# Patient Record
Sex: Female | Born: 1945 | Race: White | Hispanic: No | Marital: Married | Smoking: Former smoker
Health system: Southern US, Community
[De-identification: ages and names within clinical notes are randomized; demographics above are authoritative.]

## PROBLEM LIST (undated history)

## (undated) DIAGNOSIS — F410 Panic disorder [episodic paroxysmal anxiety] without agoraphobia: Secondary | ICD-10-CM

## (undated) DIAGNOSIS — M1990 Unspecified osteoarthritis, unspecified site: Secondary | ICD-10-CM

## (undated) DIAGNOSIS — E039 Hypothyroidism, unspecified: Secondary | ICD-10-CM

## (undated) DIAGNOSIS — E7800 Pure hypercholesterolemia, unspecified: Secondary | ICD-10-CM

## (undated) DIAGNOSIS — K5521 Angiodysplasia of colon with hemorrhage: Secondary | ICD-10-CM

## (undated) DIAGNOSIS — F32A Depression, unspecified: Secondary | ICD-10-CM

## (undated) DIAGNOSIS — F329 Major depressive disorder, single episode, unspecified: Secondary | ICD-10-CM

## (undated) DIAGNOSIS — R569 Unspecified convulsions: Secondary | ICD-10-CM

## (undated) DIAGNOSIS — E559 Vitamin D deficiency, unspecified: Secondary | ICD-10-CM

## (undated) DIAGNOSIS — M25512 Pain in left shoulder: Secondary | ICD-10-CM

## (undated) DIAGNOSIS — C4491 Basal cell carcinoma of skin, unspecified: Secondary | ICD-10-CM

## (undated) DIAGNOSIS — N63 Unspecified lump in unspecified breast: Secondary | ICD-10-CM

## (undated) DIAGNOSIS — R42 Dizziness and giddiness: Secondary | ICD-10-CM

## (undated) DIAGNOSIS — I959 Hypotension, unspecified: Secondary | ICD-10-CM

## (undated) DIAGNOSIS — C4492 Squamous cell carcinoma of skin, unspecified: Secondary | ICD-10-CM

## (undated) DIAGNOSIS — S2231XA Fracture of one rib, right side, initial encounter for closed fracture: Secondary | ICD-10-CM

## (undated) HISTORY — DX: Pure hypercholesterolemia, unspecified: E78.00

## (undated) HISTORY — DX: Basal cell carcinoma of skin, unspecified: C44.91

## (undated) HISTORY — DX: Angiodysplasia of colon with hemorrhage: K55.21

## (undated) HISTORY — DX: Depression, unspecified: F32.A

## (undated) HISTORY — DX: Panic disorder (episodic paroxysmal anxiety): F41.0

## (undated) HISTORY — DX: Hypothyroidism, unspecified: E03.9

## (undated) HISTORY — DX: Squamous cell carcinoma of skin, unspecified: C44.92

## (undated) HISTORY — PX: CATARACT EXTRACTION W/ INTRAOCULAR LENS  IMPLANT, BILATERAL: SHX1307

## (undated) HISTORY — DX: Fracture of one rib, right side, initial encounter for closed fracture: S22.31XA

## (undated) HISTORY — PX: OTHER SURGICAL HISTORY: SHX169

## (undated) HISTORY — PX: JOINT REPLACEMENT: SHX530

## (undated) HISTORY — PX: APPENDECTOMY: SHX54

## (undated) HISTORY — DX: Major depressive disorder, single episode, unspecified: F32.9

## (undated) HISTORY — DX: Vitamin D deficiency, unspecified: E55.9

## (undated) HISTORY — DX: Unspecified lump in unspecified breast: N63.0

## (undated) HISTORY — DX: Hypotension, unspecified: I95.9

---

## 1972-06-24 HISTORY — PX: TONSILLECTOMY: SUR1361

## 1979-06-25 HISTORY — PX: ABDOMINAL HYSTERECTOMY: SHX81

## 1999-05-02 ENCOUNTER — Encounter: Payer: Self-pay | Admitting: Family Medicine

## 1999-05-02 ENCOUNTER — Encounter: Admission: RE | Admit: 1999-05-02 | Discharge: 1999-05-02 | Payer: Self-pay | Admitting: Family Medicine

## 1999-10-01 ENCOUNTER — Inpatient Hospital Stay (HOSPITAL_COMMUNITY): Admission: AD | Admit: 1999-10-01 | Discharge: 1999-10-03 | Payer: Self-pay | Admitting: *Deleted

## 1999-10-03 ENCOUNTER — Inpatient Hospital Stay (HOSPITAL_COMMUNITY): Admission: EM | Admit: 1999-10-03 | Discharge: 1999-10-04 | Payer: Self-pay | Admitting: Emergency Medicine

## 1999-10-03 ENCOUNTER — Encounter: Payer: Self-pay | Admitting: Emergency Medicine

## 1999-10-08 ENCOUNTER — Other Ambulatory Visit (HOSPITAL_COMMUNITY): Admission: RE | Admit: 1999-10-08 | Discharge: 1999-10-15 | Payer: Self-pay

## 2000-01-15 ENCOUNTER — Encounter: Payer: Self-pay | Admitting: Family Medicine

## 2000-01-15 ENCOUNTER — Encounter: Admission: RE | Admit: 2000-01-15 | Discharge: 2000-01-15 | Payer: Self-pay | Admitting: Family Medicine

## 2000-06-20 ENCOUNTER — Encounter: Payer: Self-pay | Admitting: Family Medicine

## 2000-06-20 ENCOUNTER — Encounter: Admission: RE | Admit: 2000-06-20 | Discharge: 2000-06-20 | Payer: Self-pay | Admitting: Family Medicine

## 2000-07-01 ENCOUNTER — Encounter: Admission: RE | Admit: 2000-07-01 | Discharge: 2000-07-01 | Payer: Self-pay | Admitting: Family Medicine

## 2000-07-01 ENCOUNTER — Encounter: Payer: Self-pay | Admitting: Family Medicine

## 2000-07-09 ENCOUNTER — Encounter: Payer: Self-pay | Admitting: Family Medicine

## 2000-07-09 ENCOUNTER — Encounter: Admission: RE | Admit: 2000-07-09 | Discharge: 2000-07-09 | Payer: Self-pay | Admitting: Family Medicine

## 2001-03-04 ENCOUNTER — Encounter: Admission: RE | Admit: 2001-03-04 | Discharge: 2001-03-04 | Payer: Self-pay | Admitting: Family Medicine

## 2001-03-04 ENCOUNTER — Encounter: Payer: Self-pay | Admitting: Family Medicine

## 2001-06-24 HISTORY — PX: ORIF FIBULA FRACTURE: SHX5114

## 2001-12-30 ENCOUNTER — Emergency Department (HOSPITAL_COMMUNITY): Admission: EM | Admit: 2001-12-30 | Discharge: 2001-12-30 | Payer: Self-pay | Admitting: *Deleted

## 2002-01-05 ENCOUNTER — Encounter: Payer: Self-pay | Admitting: Emergency Medicine

## 2002-01-05 ENCOUNTER — Emergency Department (HOSPITAL_COMMUNITY): Admission: EM | Admit: 2002-01-05 | Discharge: 2002-01-06 | Payer: Self-pay | Admitting: Emergency Medicine

## 2002-01-06 ENCOUNTER — Emergency Department (HOSPITAL_COMMUNITY): Admission: EM | Admit: 2002-01-06 | Discharge: 2002-01-06 | Payer: Self-pay | Admitting: Emergency Medicine

## 2002-01-06 ENCOUNTER — Encounter: Payer: Self-pay | Admitting: Emergency Medicine

## 2002-01-11 ENCOUNTER — Encounter: Payer: Self-pay | Admitting: Internal Medicine

## 2002-01-11 ENCOUNTER — Inpatient Hospital Stay (HOSPITAL_COMMUNITY): Admission: EM | Admit: 2002-01-11 | Discharge: 2002-01-12 | Payer: Self-pay | Admitting: Emergency Medicine

## 2002-04-26 ENCOUNTER — Encounter: Admission: RE | Admit: 2002-04-26 | Discharge: 2002-04-26 | Payer: Self-pay | Admitting: Family Medicine

## 2002-04-26 ENCOUNTER — Encounter: Payer: Self-pay | Admitting: Family Medicine

## 2005-04-30 ENCOUNTER — Ambulatory Visit: Payer: Self-pay

## 2005-07-19 ENCOUNTER — Encounter: Admission: RE | Admit: 2005-07-19 | Discharge: 2005-07-19 | Payer: Self-pay | Admitting: Gastroenterology

## 2006-03-18 ENCOUNTER — Encounter: Admission: RE | Admit: 2006-03-18 | Discharge: 2006-03-18 | Payer: Self-pay | Admitting: Family Medicine

## 2006-03-26 ENCOUNTER — Encounter: Admission: RE | Admit: 2006-03-26 | Discharge: 2006-03-26 | Payer: Self-pay | Admitting: Family Medicine

## 2006-05-16 ENCOUNTER — Ambulatory Visit: Payer: Self-pay | Admitting: Family Medicine

## 2006-05-26 ENCOUNTER — Ambulatory Visit: Payer: Self-pay | Admitting: Family Medicine

## 2007-01-09 ENCOUNTER — Ambulatory Visit: Payer: Self-pay | Admitting: Family Medicine

## 2007-08-03 ENCOUNTER — Inpatient Hospital Stay: Payer: Self-pay | Admitting: Internal Medicine

## 2008-01-12 ENCOUNTER — Ambulatory Visit: Payer: Self-pay | Admitting: Internal Medicine

## 2008-09-06 ENCOUNTER — Emergency Department: Payer: Self-pay | Admitting: Internal Medicine

## 2008-09-09 ENCOUNTER — Observation Stay: Payer: Self-pay | Admitting: Specialist

## 2009-02-16 ENCOUNTER — Ambulatory Visit: Payer: Self-pay | Admitting: Gastroenterology

## 2009-02-22 ENCOUNTER — Ambulatory Visit: Payer: Self-pay | Admitting: Family Medicine

## 2010-03-29 ENCOUNTER — Ambulatory Visit: Payer: Self-pay | Admitting: Family Medicine

## 2010-07-14 ENCOUNTER — Encounter: Payer: Self-pay | Admitting: Family Medicine

## 2011-07-24 ENCOUNTER — Ambulatory Visit: Payer: Self-pay | Admitting: Internal Medicine

## 2011-10-21 DIAGNOSIS — N63 Unspecified lump in unspecified breast: Secondary | ICD-10-CM | POA: Insufficient documentation

## 2011-10-21 DIAGNOSIS — N6320 Unspecified lump in the left breast, unspecified quadrant: Secondary | ICD-10-CM | POA: Insufficient documentation

## 2011-10-21 HISTORY — DX: Unspecified lump in unspecified breast: N63.0

## 2011-10-22 ENCOUNTER — Ambulatory Visit: Payer: Self-pay | Admitting: Internal Medicine

## 2012-05-19 ENCOUNTER — Emergency Department: Payer: Self-pay | Admitting: Emergency Medicine

## 2012-06-10 ENCOUNTER — Ambulatory Visit: Payer: Self-pay | Admitting: Internal Medicine

## 2012-06-10 DIAGNOSIS — F17200 Nicotine dependence, unspecified, uncomplicated: Secondary | ICD-10-CM | POA: Insufficient documentation

## 2012-09-17 ENCOUNTER — Ambulatory Visit: Payer: Self-pay | Admitting: Internal Medicine

## 2012-09-30 ENCOUNTER — Ambulatory Visit: Payer: Self-pay | Admitting: Internal Medicine

## 2012-09-30 DIAGNOSIS — Z9889 Other specified postprocedural states: Secondary | ICD-10-CM | POA: Insufficient documentation

## 2012-12-03 ENCOUNTER — Ambulatory Visit: Payer: Self-pay | Admitting: Internal Medicine

## 2013-03-02 ENCOUNTER — Encounter: Payer: Self-pay | Admitting: Internal Medicine

## 2013-03-02 ENCOUNTER — Ambulatory Visit (INDEPENDENT_AMBULATORY_CARE_PROVIDER_SITE_OTHER): Payer: Medicare Other | Admitting: Internal Medicine

## 2013-03-02 VITALS — BP 100/70 | HR 83 | Temp 98.2°F | Ht 62.5 in | Wt 145.0 lb

## 2013-03-02 DIAGNOSIS — E039 Hypothyroidism, unspecified: Secondary | ICD-10-CM

## 2013-03-02 DIAGNOSIS — E559 Vitamin D deficiency, unspecified: Secondary | ICD-10-CM

## 2013-03-02 DIAGNOSIS — F329 Major depressive disorder, single episode, unspecified: Secondary | ICD-10-CM

## 2013-03-02 DIAGNOSIS — C4490 Unspecified malignant neoplasm of skin, unspecified: Secondary | ICD-10-CM

## 2013-03-02 DIAGNOSIS — F410 Panic disorder [episodic paroxysmal anxiety] without agoraphobia: Secondary | ICD-10-CM

## 2013-03-02 DIAGNOSIS — E7800 Pure hypercholesterolemia, unspecified: Secondary | ICD-10-CM

## 2013-03-02 MED ORDER — FLUOXETINE HCL 20 MG PO TABS: 20.0000 mg | ORAL_TABLET | Freq: Every day | ORAL | Status: DC

## 2013-03-02 MED ORDER — VENLAFAXINE HCL ER 37.5 MG PO CP24: ORAL_CAPSULE | ORAL | Status: DC

## 2013-03-02 NOTE — Patient Instructions (Addendum)
Take two effexor - per day.  Start prozac 20mg  one per day.  Call with update over next week and let me know how you are doing.    [name] [phone]

## 2013-03-07 ENCOUNTER — Encounter: Payer: Self-pay | Admitting: Internal Medicine

## 2013-03-07 DIAGNOSIS — F419 Anxiety disorder, unspecified: Secondary | ICD-10-CM | POA: Insufficient documentation

## 2013-03-07 DIAGNOSIS — F329 Major depressive disorder, single episode, unspecified: Secondary | ICD-10-CM | POA: Insufficient documentation

## 2013-03-07 DIAGNOSIS — F32A Depression, unspecified: Secondary | ICD-10-CM | POA: Insufficient documentation

## 2013-03-07 DIAGNOSIS — E559 Vitamin D deficiency, unspecified: Secondary | ICD-10-CM | POA: Insufficient documentation

## 2013-03-07 DIAGNOSIS — E039 Hypothyroidism, unspecified: Secondary | ICD-10-CM | POA: Insufficient documentation

## 2013-03-07 DIAGNOSIS — E7800 Pure hypercholesterolemia, unspecified: Secondary | ICD-10-CM | POA: Insufficient documentation

## 2013-03-07 DIAGNOSIS — F410 Panic disorder [episodic paroxysmal anxiety] without agoraphobia: Secondary | ICD-10-CM | POA: Insufficient documentation

## 2013-03-07 DIAGNOSIS — C4490 Unspecified malignant neoplasm of skin, unspecified: Secondary | ICD-10-CM | POA: Insufficient documentation

## 2013-03-07 NOTE — Assessment & Plan Note (Signed)
Better.  No agoraphobia.  Now with some increased stress with her friends medical issues.  Feel she needs something other than effexor.  Will taper off effexor and start prozac.  Tapering directions given.  Follow.

## 2013-03-07 NOTE — Assessment & Plan Note (Signed)
On levothyroxine.  Check tsh with next labs.

## 2013-03-07 NOTE — Progress Notes (Signed)
Subjective:    Patient ID: Pam Salazar, female    DOB: 05-Jan-1946, 67 y.o.   MRN: [id number]  HPI 67 year old female with past history of depression, hypercholesterolemia and hypothyroidism who comes in today to follow up on these issues as well as to establish care.  She reports having a history of depression and panic disorder with associated agoraphobia.  The panic disorder and agoraphobia are better.  [facility] and [facility].  On effexor for her depression.  Some increased stress recently.  Her best friend was just diagnosed with breast cancer.  She plans to be with her through her treatments.  Feels she needs something other than the effexor.  Some increased anxiety.  She did start smoking again at Christmas.  Plans to stop.  Nicotine gum works well for her.  Tries to stay active.  No cardiac symptoms with increased activity or exertion.  Breathing stable.  No nausea or vomiting.  No bowel change.  Colonoscopy three years ago.  Recommended a follow up colonoscopy in 10 years from last.  Seeing Dr Cheree Ditto for a "pre melanoma" skin lesion.  Is s/p removal.     Past Medical History  Diagnosis Date  . Hypercholesterolemia   . Hypothyroidism   . Depression   . Panic disorder     previous agoraphobia  . Vitamin D deficiency     Outpatient Encounter Prescriptions as of 03/02/2013  Medication Sig Dispense Refill  . beta carotene w/minerals (OCUVITE) tablet Take 1 tablet by mouth daily.      . clonazePAM (KLONOPIN) 1 MG tablet Take 1 mg by mouth 2 (two) times daily as needed for anxiety.      Marland Kitchen levothyroxine (SYNTHROID, LEVOTHROID) 75 MCG tablet Take 75 mcg by mouth daily before breakfast.      . simvastatin (ZOCOR) 20 MG tablet Take 20 mg by mouth every evening.      . [DISCONTINUED] venlafaxine XR (EFFEXOR-XR) 150 MG 24 hr capsule Take 150 mg by mouth daily.      Marland Kitchen FLUoxetine (PROZAC) 20 MG tablet Take 1 tablet (20 mg total) by mouth daily.  30 tablet  1  . venlafaxine XR  (EFFEXOR XR) 37.5 MG 24 hr capsule Take two per day  60 capsule  0   No facility-administered encounter medications on file as of 03/02/2013.    Review of Systems Patient denies any headache, lightheadedness or dizziness.  No sinus or allergy symptoms.  No chest pain, tightness or palpitations.  No increased shortness of breath, cough or congestion.  No nausea or vomiting.  No acid reflux.  No abdominal pain or cramping.  No bowel change, such as diarrhea, constipation, BRBPR or melana.  No urine change.   Is s/p ORIF right lower extremity.  Overall has done well.  Dealing with the increased stress of her best friends medical issues.  She feels she is dong relatively well.  Previously on zoloft and cymbalta.  Now on effexor.  Feels this is not helping as well as she would like for it to.  With some increased stress.   Some anxiety related to this.  The previous panic attacks and agoraphobia - better.       Objective:   Physical Exam Filed Vitals:   03/02/13 1333  BP: 100/70  Pulse: 83  Temp: 98.2 F (36.8 C)   Pulse 31  67 year old female in no acute distress.   HEENT:  Nares- clear.  Oropharynx - without lesions. NECK:  Supple.  Nontender.  No audible bruit.  HEART:  Appears to be regular. LUNGS:  No crackles or wheezing audible.  Respirations even and unlabored.  RADIAL PULSE:  Equal bilaterally.    ABDOMEN:  Soft, nontender.  Bowel sounds present and normal.  No audible abdominal bruit.    EXTREMITIES:  No increased edema present.  DP pulses palpable and equal bilaterally.          Assessment & Plan:  HEALTH MAINTENANCE.  Schedule her for a physical when due.  Obtain records for review.  Mammogram when due.  Colonoscopy three years ago.  Due follow up colonoscopy 10 years from last.    I spent 45 minutes with the patient and more than 50% of the time was spent in consultation regarding the above.

## 2013-03-07 NOTE — Assessment & Plan Note (Signed)
Increased stress with her friends medical issues.  On effexor.  Not having issues now with panic disorder.  Some anxiety.  Will start prozac 10mg  q day.  Taper her off effexor.  Directions given.  Follow closely.  Get her back in soon to reassess.  If any problems, call or be reevaluated.

## 2013-03-07 NOTE — Assessment & Plan Note (Signed)
Followed by Dr Cheree Ditto.  States had a "pre melanoma" lesion.  Removed.  Continues follow up with Dr Cheree Ditto.

## 2013-03-07 NOTE — Assessment & Plan Note (Signed)
On simvastatin.  Low cholesterol diet and exercise.  Check lipid panel and liver function.  

## 2013-03-07 NOTE — Assessment & Plan Note (Signed)
Recheck vitamin D level 

## 2013-03-11 ENCOUNTER — Telehealth: Payer: Self-pay | Admitting: Internal Medicine

## 2013-03-11 ENCOUNTER — Encounter: Payer: Self-pay | Admitting: Internal Medicine

## 2013-03-11 DIAGNOSIS — Z860100 Personal history of colon polyps, unspecified: Secondary | ICD-10-CM | POA: Insufficient documentation

## 2013-03-11 NOTE — Telephone Encounter (Signed)
Patient Information:  Caller Name: Duaa  Phone: [phone]  Patient: Pam Salazar, Pam Salazar  Gender: Female  DOB: Sep 05, 1945  Age: 67 Years  PCP: Dale Fowler  Office Follow Up:  Does the office need to follow up with this patient?: No  Instructions For The Office: N/A   Symptoms  Reason For Call & Symptoms: Pt states has sting  on her left lower leg.  Reviewed Health History In EMR: Yes  Reviewed Medications In EMR: Yes  Reviewed Allergies In EMR: Yes  Reviewed Surgeries / Procedures: Yes  Date of Onset of Symptoms: 03/11/2013  Guideline(s) Used:  Bee Sting  Disposition Per Guideline:   Home Care  Reason For Disposition Reached:   Normal local reaction to bee, wasp, or yellow jacket sting  Advice Given:  Apply Cold to the Area for Pain - Cold Pack Method:  Wrap a bag of ice in a towel (or use a bag of frozen vegetables such as peas).  Apply this cold pack to the area of the sting for 10-20 minutes.  You may repeat this as needed, to relieve symptoms of pain and swelling.  Pain Medicines:  For pain relief, you can take either acetaminophen, ibuprofen, or naproxen.  Acetaminophen (e.g., Tylenol):  Regular Strength Tylenol: Take 650 mg (two 325 mg pills) by mouth every 4-6 hours as needed. Each Regular Strength Tylenol pill has 325 mg of acetaminophen.  Hydrocortisone Cream for Itching:  Hydrocortisone cream applied to the sting area 4 times a day can also help reduce itching. Use it for a couple days until the itch is mild.  Available over-the-counter in [place] as 0.5% and 1% cream.  Available over-the-counter in [place] as 0.5% cream.  Read the package instructions thoroughly on all medications that you use.  Expected Course:  Stings only rarely get infected.  Call Back If:  Difficulty breathing or swallowing (generally develops within the first 2 hours after the sting; call 911)  Swelling becomes huge  Sting begins to look infected  You become worse.  Patient Will  Follow Care Advice:  YES

## 2013-03-11 NOTE — Telephone Encounter (Signed)
noted 

## 2013-03-13 ENCOUNTER — Encounter: Payer: Self-pay | Admitting: Internal Medicine

## 2013-03-22 ENCOUNTER — Encounter: Payer: Self-pay | Admitting: Internal Medicine

## 2013-03-23 ENCOUNTER — Encounter: Payer: Self-pay | Admitting: Internal Medicine

## 2013-03-24 ENCOUNTER — Encounter: Payer: Self-pay | Admitting: Internal Medicine

## 2013-03-31 ENCOUNTER — Encounter: Payer: Self-pay | Admitting: Internal Medicine

## 2013-04-02 ENCOUNTER — Encounter: Payer: Self-pay | Admitting: Internal Medicine

## 2013-04-02 ENCOUNTER — Other Ambulatory Visit: Payer: Self-pay | Admitting: *Deleted

## 2013-04-02 ENCOUNTER — Other Ambulatory Visit: Payer: Self-pay | Admitting: Internal Medicine

## 2013-04-02 MED ORDER — VENLAFAXINE HCL ER 150 MG PO CP24: 150.0000 mg | ORAL_CAPSULE | Freq: Every day | ORAL | Status: DC

## 2013-04-02 MED ORDER — SIMVASTATIN 20 MG PO TABS: 20.0000 mg | ORAL_TABLET | Freq: Every evening | ORAL | Status: DC

## 2013-04-02 MED ORDER — LEVOTHYROXINE SODIUM 75 MCG PO TABS: 75.0000 ug | ORAL_TABLET | Freq: Every day | ORAL | Status: DC

## 2013-04-02 MED ORDER — CLONAZEPAM 1 MG PO TABS: 1.0000 mg | ORAL_TABLET | Freq: Two times a day (BID) | ORAL | Status: DC | PRN

## 2013-04-02 NOTE — Telephone Encounter (Signed)
Refilled Effexor-XR 150 mg per Dr. Roby Lofts last myChart message.

## 2013-04-13 ENCOUNTER — Encounter: Payer: Self-pay | Admitting: *Deleted

## 2013-04-14 ENCOUNTER — Encounter: Payer: Self-pay | Admitting: Internal Medicine

## 2013-04-14 ENCOUNTER — Ambulatory Visit (INDEPENDENT_AMBULATORY_CARE_PROVIDER_SITE_OTHER): Payer: Medicare Other | Admitting: Internal Medicine

## 2013-04-14 VITALS — BP 110/70 | HR 81 | Temp 97.7°F | Ht 62.5 in | Wt 145.2 lb

## 2013-04-14 DIAGNOSIS — E559 Vitamin D deficiency, unspecified: Secondary | ICD-10-CM

## 2013-04-14 DIAGNOSIS — F410 Panic disorder [episodic paroxysmal anxiety] without agoraphobia: Secondary | ICD-10-CM

## 2013-04-14 DIAGNOSIS — C4490 Unspecified malignant neoplasm of skin, unspecified: Secondary | ICD-10-CM

## 2013-04-14 DIAGNOSIS — E039 Hypothyroidism, unspecified: Secondary | ICD-10-CM

## 2013-04-14 DIAGNOSIS — Z86010 Personal history of colonic polyps: Secondary | ICD-10-CM

## 2013-04-14 DIAGNOSIS — F329 Major depressive disorder, single episode, unspecified: Secondary | ICD-10-CM

## 2013-04-14 DIAGNOSIS — E7800 Pure hypercholesterolemia, unspecified: Secondary | ICD-10-CM

## 2013-04-14 MED ORDER — CLONAZEPAM 1 MG PO TABS: 1.0000 mg | ORAL_TABLET | Freq: Two times a day (BID) | ORAL | Status: DC | PRN

## 2013-04-14 NOTE — Progress Notes (Signed)
Subjective:    Patient ID: Pam Salazar, female    DOB: 03-Sep-1945, 67 y.o.   MRN: [id number]  HPI 67 year old female with past history of depression, hypercholesterolemia and hypothyroidism who comes in today for a scheduled follow up.  She reports having a history of depression and panic disorder with associated agoraphobia.  The panic disorder and agoraphobia are better.  [facility] and [facility], etc.  On effexor for her depression.  Some increased anxiety recently.  See last note for details.  We tried to change her to prozac.  She did not tolerate the change.  Feels better now.  Stress is better.  Her friend is doing better.   She did start smoking again at Christmas.  Plans to stop.  Nicotine gum works well for her.  Tries to stay active.  No cardiac symptoms with increased activity or exertion.  Breathing stable.  No nausea or vomiting.  No bowel change.  Colonoscopy three years ago.  Recommended a follow up colonoscopy in 10 years from last.  Seeing Dr Cheree Ditto for a "pre melanoma" skin lesion.  Is s/p removal.     Past Medical History  Diagnosis Date  . Hypercholesterolemia   . Hypothyroidism   . Depression   . Panic disorder     previous agoraphobia  . Vitamin D deficiency     Outpatient Encounter Prescriptions as of 04/14/2013  Medication Sig Dispense Refill  . beta carotene w/minerals (OCUVITE) tablet Take 1 tablet by mouth daily.      . clonazePAM (KLONOPIN) 1 MG tablet Take 1 tablet (1 mg total) by mouth 2 (two) times daily as needed for anxiety.  30 tablet  2  . levothyroxine (SYNTHROID, LEVOTHROID) 75 MCG tablet Take 1 tablet (75 mcg total) by mouth daily before breakfast.  30 tablet  5  . simvastatin (ZOCOR) 20 MG tablet Take 1 tablet (20 mg total) by mouth every evening.  30 tablet  5  . venlafaxine XR (EFFEXOR-XR) 150 MG 24 hr capsule Take 1 capsule (150 mg total) by mouth daily.  30 capsule  5  . [DISCONTINUED] FLUoxetine (PROZAC) 20 MG tablet Take 1 tablet (20  mg total) by mouth daily.  30 tablet  1   No facility-administered encounter medications on file as of 04/14/2013.    Review of Systems Patient denies any headache, lightheadedness or dizziness.  No sinus or allergy symptoms.  No chest pain, tightness or palpitations.  No increased shortness of breath, cough or congestion.  No nausea or vomiting.  No acid reflux.  No abdominal pain or cramping.  No bowel change, such as diarrhea, constipation, BRBPR or melana.  No urine change.   Is s/p ORIF right lower extremity.  Overall has done well.  Dealing with the increased stress of her best friends medical issues.  She feels she is dong relatively well.  Previously on zoloft and cymbalta.  Now on effexor. This is working well for her now.  Stress better.  Overall feels good.       Objective:   Physical Exam  Filed Vitals:   04/14/13 1106  BP: 110/70  Pulse: 81  Temp: 97.7 F (48.66 C)   67 year old female in no acute distress.   HEENT:  Nares- clear.  Oropharynx - without lesions. NECK:  Supple.  Nontender.  No audible bruit.  HEART:  Appears to be regular. LUNGS:  No crackles or wheezing audible.  Respirations even and unlabored.  RADIAL PULSE:  Equal bilaterally.    ABDOMEN:  Soft, nontender.  Bowel sounds present and normal.  No audible abdominal bruit.    EXTREMITIES:  No increased edema present.  DP pulses palpable and equal bilaterally.          Assessment & Plan:  HEALTH MAINTENANCE.  Schedule her for a physical when due.  Obtain records for review.  Mammogram when due.  Colonoscopy three years ago.  Due follow up colonoscopy 10 years from last.

## 2013-04-14 NOTE — Assessment & Plan Note (Addendum)
Better.  No agoraphobia.  Anxiety better.  On effexor.  Follow.    

## 2013-04-18 ENCOUNTER — Encounter: Payer: Self-pay | Admitting: Internal Medicine

## 2013-04-18 NOTE — Assessment & Plan Note (Signed)
On simvastatin.  Low cholesterol diet and exercise.  Follow lipid panel and liver function.      

## 2013-04-18 NOTE — Assessment & Plan Note (Signed)
Follow vitamin D level.  

## 2013-04-18 NOTE — Assessment & Plan Note (Signed)
On levothyroxine.  Check tsh with next labs.

## 2013-04-18 NOTE — Assessment & Plan Note (Signed)
Colonoscopy as outlined.  Follow.  

## 2013-04-18 NOTE — Assessment & Plan Note (Signed)
Followed by Dr Cheree Ditto.  States had a "pre melanoma" lesion.  Removed.  Continues follow up with Dr Cheree Ditto.

## 2013-04-18 NOTE — Assessment & Plan Note (Signed)
Doing well on effexor.  Follow.  Feels better.   

## 2013-04-29 ENCOUNTER — Other Ambulatory Visit: Payer: Self-pay

## 2013-05-06 ENCOUNTER — Encounter: Payer: Self-pay | Admitting: Internal Medicine

## 2013-05-06 ENCOUNTER — Ambulatory Visit: Payer: Medicare Other | Admitting: Adult Health

## 2013-05-07 ENCOUNTER — Encounter: Payer: Self-pay | Admitting: Adult Health

## 2013-05-07 ENCOUNTER — Ambulatory Visit (INDEPENDENT_AMBULATORY_CARE_PROVIDER_SITE_OTHER): Payer: Medicare Other | Admitting: Adult Health

## 2013-05-07 VITALS — BP 134/78 | HR 110 | Temp 97.9°F | Resp 14 | Wt 146.0 lb

## 2013-05-07 DIAGNOSIS — J329 Chronic sinusitis, unspecified: Secondary | ICD-10-CM

## 2013-05-07 MED ORDER — AMOXICILLIN-POT CLAVULANATE 875-125 MG PO TABS: 1.0000 | ORAL_TABLET | Freq: Two times a day (BID) | ORAL | Status: DC

## 2013-05-07 MED ORDER — FLUTICASONE PROPIONATE 50 MCG/ACT NA SUSP: 2.0000 | Freq: Every day | NASAL | Status: DC

## 2013-05-07 MED ORDER — GUAIFENESIN-CODEINE 100-10 MG/5ML PO SOLN: 5.0000 mL | Freq: Three times a day (TID) | ORAL | Status: DC | PRN

## 2013-05-07 NOTE — Assessment & Plan Note (Signed)
Start Augmentin twice a day x10 days. Flonase 2 sprays into each nostril daily. 

## 2013-05-07 NOTE — Progress Notes (Signed)
Pre visit review using our clinic review tool, if applicable. No additional management support is needed unless otherwise documented below in the visit note. 

## 2013-05-07 NOTE — Progress Notes (Signed)
  Subjective:    Patient ID: Pam Salazar, female    DOB: Mar 01, 1946, 67 y.o.   MRN: [id number]  HPI  Patient is a 67 year old female who presents to clinic with sore throat x2 days, cough, green drainage from sinuses, sinus pressure.    Review of Systems  Constitutional: Negative for fever and chills.  HENT: Positive for congestion, postnasal drip, rhinorrhea, sinus pressure and sore throat.        Green drainage  Respiratory: Positive for cough and wheezing.   Neurological: Positive for headaches.       Objective:   Physical Exam  Constitutional: She is oriented to person, place, and time. She appears well-developed and well-nourished.  HENT:  Right Ear: External ear normal.  Left Ear: External ear normal.  Pharyngeal erythema  Cardiovascular: Normal rate and regular rhythm.  Exam reveals no gallop.   No murmur heard. Pulmonary/Chest: Effort normal and breath sounds normal. No respiratory distress. She has no wheezes.  Neurological: She is alert and oriented to person, place, and time.  Psychiatric: She has a normal mood and affect. Her behavior is normal. Judgment and thought content normal.    BP 134/78  Pulse 110  Temp(Src) 97.9 F (36.6 C) (Oral)  Resp 14  Wt 146 lb (66.225 kg)  SpO2 97%      Assessment & Plan:

## 2013-05-07 NOTE — Progress Notes (Signed)
Rx phoned to pharmacy.  

## 2013-05-13 ENCOUNTER — Ambulatory Visit: Payer: Self-pay | Admitting: Family Medicine

## 2013-05-25 ENCOUNTER — Encounter: Payer: Self-pay | Admitting: *Deleted

## 2013-05-27 NOTE — Telephone Encounter (Signed)
Mailed unread message to pt  

## 2013-07-21 ENCOUNTER — Encounter: Payer: Self-pay | Admitting: Internal Medicine

## 2013-07-26 ENCOUNTER — Telehealth: Payer: Self-pay | Admitting: Internal Medicine

## 2013-07-26 ENCOUNTER — Other Ambulatory Visit: Payer: Self-pay | Admitting: Internal Medicine

## 2013-07-26 ENCOUNTER — Telehealth: Payer: Self-pay | Admitting: Emergency Medicine

## 2013-07-26 NOTE — Telephone Encounter (Signed)
Can you help with this?  She has the appt.  Just let me know what I need to do.

## 2013-07-26 NOTE — Telephone Encounter (Signed)
Referral to [facility] under way for Dr. Herbert Deaner

## 2013-07-26 NOTE — Telephone Encounter (Signed)
Please notify pt that we have faxed for approval.

## 2013-07-26 NOTE — Telephone Encounter (Signed)
Ok refill? 

## 2013-07-26 NOTE — Telephone Encounter (Signed)
Referral faxed to [facility] for approval.

## 2013-07-26 NOTE — Telephone Encounter (Signed)
Patient has a new mole on her left upper arm and it is tender to the touch. She would like a referral to the dermatologist she now has [employer]. Her dermeatologist is Dr Cleophas [facility] and she has an appointment with them today. She has had skin cancer in the past.

## 2013-07-26 NOTE — Telephone Encounter (Signed)
Refilled #[id number] with one refill - clonazepam.

## 2013-07-27 NOTE — Telephone Encounter (Signed)
Pt calling in to be sure we will refill this medication as she only have one left.

## 2013-07-27 NOTE — Telephone Encounter (Signed)
LVM for patient to call our office  

## 2013-07-29 NOTE — Telephone Encounter (Signed)
Pt has been approved to see Dr. Herbert Deaner with 4 visits, exp 01/23/14. Auth # [id number]

## 2013-07-30 NOTE — Telephone Encounter (Signed)
Refilled on 07/26/13

## 2013-08-03 ENCOUNTER — Encounter: Payer: Self-pay | Admitting: Internal Medicine

## 2013-08-03 ENCOUNTER — Ambulatory Visit (INDEPENDENT_AMBULATORY_CARE_PROVIDER_SITE_OTHER): Payer: Medicare HMO | Admitting: Internal Medicine

## 2013-08-03 VITALS — BP 110/70 | HR 79 | Temp 98.1°F | Ht 62.5 in | Wt 140.8 lb

## 2013-08-03 DIAGNOSIS — F32A Depression, unspecified: Secondary | ICD-10-CM

## 2013-08-03 DIAGNOSIS — F3289 Other specified depressive episodes: Secondary | ICD-10-CM

## 2013-08-03 DIAGNOSIS — Z01818 Encounter for other preprocedural examination: Secondary | ICD-10-CM

## 2013-08-03 DIAGNOSIS — F329 Major depressive disorder, single episode, unspecified: Secondary | ICD-10-CM

## 2013-08-03 DIAGNOSIS — Z860100 Personal history of colon polyps, unspecified: Secondary | ICD-10-CM

## 2013-08-03 DIAGNOSIS — K625 Hemorrhage of anus and rectum: Secondary | ICD-10-CM

## 2013-08-03 DIAGNOSIS — E559 Vitamin D deficiency, unspecified: Secondary | ICD-10-CM

## 2013-08-03 DIAGNOSIS — Z23 Encounter for immunization: Secondary | ICD-10-CM

## 2013-08-03 DIAGNOSIS — E7800 Pure hypercholesterolemia, unspecified: Secondary | ICD-10-CM

## 2013-08-03 DIAGNOSIS — F410 Panic disorder [episodic paroxysmal anxiety] without agoraphobia: Secondary | ICD-10-CM

## 2013-08-03 DIAGNOSIS — E039 Hypothyroidism, unspecified: Secondary | ICD-10-CM

## 2013-08-03 NOTE — Progress Notes (Signed)
Subjective:    Patient ID: Pam Salazar, female    DOB: 1945-12-10, 68 y.o.   MRN: [id number]  HPI 68 year old female with past history of depression, hypercholesterolemia and hypothyroidism who comes in today to follow up on these issues as well as for a complete physical exam.   She reports having a history of depression and panic disorder with associated agoraphobia.  The panic disorder and agoraphobia are better.  [facility] and [facility], etc.  On effexor for her depression.  Some increased anxiety recently.  See last note for details.  We tried to change her to prozac.  She did not tolerate the change.  Feels better now.  Stress is better.  Her friend is doing better.   She did start smoking again at Christmas.  Used nicotine gum.  Has stopped.   Tries to stay active.  No cardiac symptoms with increased activity or exertion.  Breathing stable.  No nausea or vomiting.  No bowel change.  Seeing Dr Phillip Heal for a "pre melanoma" skin lesion.  Is s/p removal.  She does report having some problems with constipation.  Eating prunes.  Taking stool softener and fiber one.  Has noticed some pink/reddish stains on her pants.  Saw Dr Dionne Milo and had colonoscopy five years ago.  Has a maternal uncle and maternal nephew with colon cancer.     Past Medical History  Diagnosis Date  . Hypercholesterolemia   . Hypothyroidism   . Depression   . Panic disorder     previous agoraphobia  . Vitamin D deficiency     Outpatient Encounter Prescriptions as of 08/03/2013  Medication Sig  . beta carotene w/minerals (OCUVITE) tablet Take 1 tablet by mouth daily.  . clonazePAM (KLONOPIN) 1 MG tablet TAKE ONE TABLET BY MOUTH TWICE A DAY AS NEEDED FOR ANXIETY   . levothyroxine (SYNTHROID, LEVOTHROID) 75 MCG tablet Take 1 tablet (75 mcg total) by mouth daily before breakfast.  . simvastatin (ZOCOR) 20 MG tablet Take 1 tablet (20 mg total) by mouth every evening.  . venlafaxine XR (EFFEXOR-XR) 150 MG 24 hr  capsule Take 1 capsule (150 mg total) by mouth daily.  . [DISCONTINUED] fluticasone (FLONASE) 50 MCG/ACT nasal spray Place 2 sprays into both nostrils daily.  . [DISCONTINUED] amoxicillin-clavulanate (AUGMENTIN) 875-125 MG per tablet Take 1 tablet by mouth 2 (two) times daily.  . [DISCONTINUED] guaiFENesin-codeine 100-10 MG/5ML syrup Take 5 mLs by mouth 3 (three) times daily as needed for cough.    Review of Systems Patient denies any headache, lightheadedness or dizziness.  No sinus or allergy symptoms.  No chest pain, tightness or palpitations.  No increased shortness of breath, cough or congestion.  No nausea or vomiting.  No acid reflux.  No abdominal pain or cramping.  No bowel change, such as diarrhea or melana.  Does report some constipation.  Some BRB on her underpants (occasionally).  No urine change.   Is s/p ORIF right lower extremity.  Overall has done well.  Dealing with the increased stress of her best friends medical issues.  She feels she is doing better.  Previously on zoloft and cymbalta.  Now on effexor. This is working well for her now.  Stress better.  Overall feels good.       Objective:   Physical Exam  Filed Vitals:   08/03/13 1548  BP: 110/70  Pulse: 79  Temp: 98.1 F (67.36 C)   68 year old female in no  acute distress.   HEENT:  Nares- clear.  Oropharynx - without lesions. NECK:  Supple.  Nontender.  No audible bruit.  HEART:  Appears to be regular. LUNGS:  No crackles or wheezing audible.  Respirations even and unlabored.  RADIAL PULSE:  Equal bilaterally.    ABDOMEN:  Soft, nontender.  Bowel sounds present and normal.  No audible abdominal bruit.   RECTAL:  She declined.    EXTREMITIES:  No increased edema present.  DP pulses palpable and equal bilaterally.          Assessment & Plan:  HEALTH MAINTENANCE.  Schedule her for a physical next visit.  Mammogram when due.  Colonoscopy (she reports) five years ago.

## 2013-08-03 NOTE — Progress Notes (Signed)
Pre-visit discussion using our clinic review tool. No additional management support is needed unless otherwise documented below in the visit note.  

## 2013-08-08 ENCOUNTER — Encounter: Payer: Self-pay | Admitting: Internal Medicine

## 2013-08-08 DIAGNOSIS — Z01818 Encounter for other preprocedural examination: Secondary | ICD-10-CM | POA: Insufficient documentation

## 2013-08-08 NOTE — Assessment & Plan Note (Signed)
Colonoscopy as outlined.  Now with rectal bleeding and bowel changes.  Refer back to GI for further evaluation and colonoscopy f/u.

## 2013-08-08 NOTE — Assessment & Plan Note (Signed)
Better.  No agoraphobia.  Anxiety better.  On effexor.  Follow.    

## 2013-08-08 NOTE — Assessment & Plan Note (Signed)
On levothyroxine.  Follow tsh.  

## 2013-08-08 NOTE — Assessment & Plan Note (Signed)
Follow vitamin D level.  

## 2013-08-08 NOTE — Assessment & Plan Note (Signed)
On simvastatin.  Low cholesterol diet and exercise.  Follow lipid panel and liver function.      

## 2013-08-08 NOTE — Assessment & Plan Note (Signed)
She is active with no cardiac symptoms with increased activity or exertion.  I feel from a cardiac stand point she is at low risk to proceed with the planned surgery.  She will need close intra op and post op monitoring of her heart rate and blood pressure to avoid extremes.

## 2013-08-08 NOTE — Assessment & Plan Note (Signed)
Doing well on effexor.  Follow.  Feels better.   

## 2013-08-09 ENCOUNTER — Other Ambulatory Visit (INDEPENDENT_AMBULATORY_CARE_PROVIDER_SITE_OTHER): Payer: Commercial Managed Care - HMO

## 2013-08-09 DIAGNOSIS — K625 Hemorrhage of anus and rectum: Secondary | ICD-10-CM

## 2013-08-09 DIAGNOSIS — E039 Hypothyroidism, unspecified: Secondary | ICD-10-CM

## 2013-08-09 DIAGNOSIS — E7800 Pure hypercholesterolemia, unspecified: Secondary | ICD-10-CM

## 2013-08-09 LAB — TSH: TSH: 1.71 u[IU]/mL (ref 0.35–5.50)

## 2013-08-09 LAB — LIPID PANEL
Cholesterol: 149 mg/dL (ref 0–200)
HDL: 52.1 mg/dL (ref >39.00)
LDL Cholesterol: 70 mg/dL (ref 0–99)
TRIGLYCERIDES: 135 mg/dL (ref 0.0–149.0)
Total CHOL/HDL Ratio: 3
VLDL: 27 mg/dL (ref 0.0–40.0)

## 2013-08-09 LAB — CBC WITH DIFFERENTIAL/PLATELET
Basophils Absolute: 0 10*3/uL (ref 0.0–0.1)
Basophils Relative: 0.2 % (ref 0.0–3.0)
EOS ABS: 0 10*3/uL (ref 0.0–0.7)
EOS PCT: 0.2 % (ref 0.0–5.0)
HEMATOCRIT: 43.3 % (ref 36.0–46.0)
Hemoglobin: 14.2 g/dL (ref 12.0–15.0)
LYMPHS ABS: 1.6 10*3/uL (ref 0.7–4.0)
Lymphocytes Relative: 27.6 % (ref 12.0–46.0)
MCHC: 32.7 g/dL (ref 30.0–36.0)
MCV: 90.6 fl (ref 78.0–100.0)
Monocytes Absolute: 0.6 10*3/uL (ref 0.1–1.0)
Monocytes Relative: 10.5 % (ref 3.0–12.0)
Neutro Abs: 3.5 10*3/uL (ref 1.4–7.7)
Neutrophils Relative %: 61.5 % (ref 43.0–77.0)
Platelets: 200 10*3/uL (ref 150.0–400.0)
RBC: 4.78 Mil/uL (ref 3.87–5.11)
RDW: 13.3 % (ref 11.5–14.6)
WBC: 5.7 10*3/uL (ref 4.5–10.5)

## 2013-08-09 LAB — COMPREHENSIVE METABOLIC PANEL
ALT: 17 U/L (ref 0–35)
AST: 25 U/L (ref 0–37)
Albumin: 4.2 g/dL (ref 3.5–5.2)
Alkaline Phosphatase: 48 U/L (ref 39–117)
BILIRUBIN TOTAL: 0.6 mg/dL (ref 0.3–1.2)
BUN: 17 mg/dL (ref 6–23)
CO2: 24 mEq/L (ref 19–32)
Calcium: 9.4 mg/dL (ref 8.4–10.5)
Chloride: 106 mEq/L (ref 96–112)
Creatinine, Ser: 0.8 mg/dL (ref 0.4–1.2)
GFR: 73.87 mL/min (ref >60.00)
GLUCOSE: 84 mg/dL (ref 70–99)
Potassium: 4.2 mEq/L (ref 3.5–5.1)
SODIUM: 142 meq/L (ref 135–145)
Total Protein: 7 g/dL (ref 6.0–8.3)

## 2013-08-10 ENCOUNTER — Other Ambulatory Visit: Payer: Medicare HMO

## 2013-08-10 ENCOUNTER — Encounter: Payer: Self-pay | Admitting: Internal Medicine

## 2013-08-12 ENCOUNTER — Encounter: Payer: Self-pay | Admitting: Internal Medicine

## 2013-08-12 NOTE — Telephone Encounter (Signed)
Mailed unread MyChart message to pt  

## 2013-08-16 ENCOUNTER — Ambulatory Visit (INDEPENDENT_AMBULATORY_CARE_PROVIDER_SITE_OTHER): Payer: Commercial Managed Care - HMO | Admitting: Internal Medicine

## 2013-08-16 ENCOUNTER — Encounter: Payer: Self-pay | Admitting: Internal Medicine

## 2013-08-16 VITALS — BP 102/80 | HR 89 | Temp 98.3°F | Resp 16 | Wt 140.0 lb

## 2013-08-16 DIAGNOSIS — M79609 Pain in unspecified limb: Secondary | ICD-10-CM

## 2013-08-16 DIAGNOSIS — M79676 Pain in unspecified toe(s): Secondary | ICD-10-CM

## 2013-08-16 DIAGNOSIS — R0781 Pleurodynia: Secondary | ICD-10-CM

## 2013-08-16 DIAGNOSIS — R079 Chest pain, unspecified: Secondary | ICD-10-CM

## 2013-08-16 MED ORDER — CYCLOBENZAPRINE HCL ER 15 MG PO CP24: 15.0000 mg | ORAL_CAPSULE | Freq: Every day | ORAL | Status: DC | PRN

## 2013-08-16 MED ORDER — HYDROCODONE-ACETAMINOPHEN 10-325 MG PO TABS: 1.0000 | ORAL_TABLET | Freq: Three times a day (TID) | ORAL | Status: DC | PRN

## 2013-08-16 NOTE — Progress Notes (Signed)
Patient ID: Pam Salazar, female   DOB: Dec 17, 1945, 68 y.o.   MRN: [id number]  Patient Active Problem List   Diagnosis Date Noted  . Rib pain on right side 08/17/2013  . Great toe pain 08/17/2013  . Pre-op evaluation 08/08/2013  . Sinusitis 05/07/2013  . Personal history of colonic polyps 03/11/2013  . Depression 03/07/2013  . Panic disorder 03/07/2013  . Hypercholesterolemia 03/07/2013  . Hypothyroidism 03/07/2013  . Vitamin D deficiency 03/07/2013  . Skin cancer 03/07/2013    Subjective:  CC:   Chief Complaint  Patient presents with  . Acute Visit    Rib pain since 08/06/13 on right side. Right foot pain swolen at ball of great toe    HPI:   Pam Salazar is a 68 y.o. female who presents for  2 week history of severe right sided anterior rib pain which started after leaning on the side of her bathroom sink while bathing grandson in the tub .  Reports hearing/feeling a "pop" and had immediate pain without bruising.  Iced the area repeatedly and took NSAID  With no significant change.  Hurts to use right arm , evening opening the refrigerator door is moderately painful.  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  2) Last night started having right first metatarsal pain and swelling without any history of trauma or overuse .  She has no history of gout,        Past Medical History  Diagnosis Date  . Hypercholesterolemia   . Hypothyroidism   . Depression   . Panic disorder     previous agoraphobia  . Vitamin D deficiency     Past Surgical History  Procedure Laterality Date  . Abdominal hysterectomy  1981    partial - secondary to bleeding, ovaries not removed -   . Orif fibula fracture Right 2003    right lower extremitiy  . Tonsillectomy  1974       The following portions of the patient's history were reviewed and updated as appropriate: Allergies, current medications, and problem list.    Review of Systems:   Patient denies headache,  fevers, malaise, unintentional weight loss, skin rash, eye pain, sinus congestion and sinus pain, sore throat, dysphagia,  hemoptysis , cough, dyspnea, wheezing, chest pain, palpitations, orthopnea, edema, abdominal pain, nausea, melena, diarrhea, constipation, flank pain, dysuria, hematuria, urinary  Frequency, nocturia, numbness, tingling, seizures,  Focal weakness, Loss of consciousness,  Tremor, insomnia, depression, anxiety, and suicidal ideation.     History   Social History  . Marital Status: Married    Spouse Name: N/A    Number of Children: N/A  . Years of Education: N/A   Occupational History  . Not on file.   Social History Main Topics  . Smoking status: Former Smoker    Types: Cigarettes  . Smokeless tobacco: Former Systems developer    Quit date: 07/26/2013     Comment: tryin to quit-using gum as a replacement  . Alcohol Use: No  . Drug Use: No  . Sexual Activity: Not on file   Other Topics Concern  . Not on file   Social History Narrative  . No narrative on file    Objective:  Filed Vitals:   08/16/13 1830  BP: 102/80  Pulse: 89  Temp: 98.3 F (36.8 C)  Resp: 16     General appearance: alert, cooperative and appears stated age Ears: normal TM's and external ear canals  both ears Throat: lips, mucosa, and tongue normal; teeth and gums normal Neck: no adenopathy, no carotid bruit, supple, symmetrical, trachea midline and thyroid not enlarged, symmetric, no tenderness/mass/nodules Back: symmetric, no curvature. ROM normal. No CVA tenderness. Chest: exquisitely tender without bruising over lowest right anterior rib  Lungs: clear to auscultation bilaterally Heart: regular rate and rhythm, S1, S2 normal, no murmur, click, rub or gallop Abdomen: soft, non-tender; bowel sounds normal; no masses,  no organomegaly Pulses: 2+ and symmetric Skin: Skin color, texture, turgor normal. No rashes or lesions Lymph nodes: Cervical, supraclavicular, and axillary nodes  normal.  Assessment and Plan:  Rib pain on right side Fracture vs dislocation favored,  Plain fin,s  Antispasmodics, NAAIDs and vicodin   Great toe pain No signs of trauma or cellulitis.  Will treat for gout with NSAID   Updated Medication List Outpatient Encounter Prescriptions as of 08/16/2013  Medication Sig  . beta carotene w/minerals (OCUVITE) tablet Take 1 tablet by mouth daily.  . clonazePAM (KLONOPIN) 1 MG tablet TAKE ONE TABLET BY MOUTH TWICE A DAY AS NEEDED FOR ANXIETY   . levothyroxine (SYNTHROID, LEVOTHROID) 75 MCG tablet Take 1 tablet (75 mcg total) by mouth daily before breakfast.  . simvastatin (ZOCOR) 20 MG tablet Take 1 tablet (20 mg total) by mouth every evening.  . venlafaxine XR (EFFEXOR-XR) 150 MG 24 hr capsule Take 1 capsule (150 mg total) by mouth daily.  . cyclobenzaprine (AMRIX) 15 MG 24 hr capsule Take 1 capsule (15 mg total) by mouth daily as needed for muscle spasms.  Marland Kitchen HYDROcodone-acetaminophen (NORCO) 10-325 MG per tablet Take 1 tablet by mouth every 8 (eight) hours as needed.     Orders Placed This Encounter  Procedures  . DG Ribs Unilateral W/Chest Right    No Follow-up on file.

## 2013-08-16 NOTE — Patient Instructions (Signed)
X rays to rule out rib fracture  tomorrow at [facility] office   Aleve twice daily or meloxicam 15 mg once daily for inflammation (toe and rib)  amrix samples (flexeril XR ) ig needed for muscle spasm.

## 2013-08-16 NOTE — Progress Notes (Signed)
Pre-visit discussion using our clinic review tool. No additional management support is needed unless otherwise documented below in the visit note.  

## 2013-08-17 ENCOUNTER — Ambulatory Visit (INDEPENDENT_AMBULATORY_CARE_PROVIDER_SITE_OTHER)
Admission: RE | Admit: 2013-08-17 | Discharge: 2013-08-17 | Disposition: A | Discharge status: Home / Self Care | Payer: Commercial Managed Care - HMO | Source: Ambulatory Visit | Attending: Internal Medicine | Admitting: Internal Medicine

## 2013-08-17 ENCOUNTER — Encounter: Payer: Self-pay | Admitting: Internal Medicine

## 2013-08-17 DIAGNOSIS — S2231XA Fracture of one rib, right side, initial encounter for closed fracture: Secondary | ICD-10-CM | POA: Insufficient documentation

## 2013-08-17 DIAGNOSIS — R079 Chest pain, unspecified: Secondary | ICD-10-CM

## 2013-08-17 DIAGNOSIS — R0781 Pleurodynia: Secondary | ICD-10-CM

## 2013-08-17 DIAGNOSIS — M79676 Pain in unspecified toe(s): Secondary | ICD-10-CM | POA: Insufficient documentation

## 2013-08-17 HISTORY — DX: Fracture of one rib, right side, initial encounter for closed fracture: S22.31XA

## 2013-08-17 NOTE — Assessment & Plan Note (Signed)
No signs of trauma or cellulitis.  Will treat for gout with NSAID

## 2013-08-17 NOTE — Assessment & Plan Note (Signed)
Fracture vs dislocation favored,  Plain fin,s  Antispasmodics, NAAIDs and vicodin

## 2013-08-23 NOTE — Telephone Encounter (Signed)
Mailed unread MyChart message to pt  

## 2013-09-01 ENCOUNTER — Other Ambulatory Visit: Payer: Self-pay | Admitting: *Deleted

## 2013-09-01 MED ORDER — SIMVASTATIN 20 MG PO TABS: 20.0000 mg | ORAL_TABLET | Freq: Every evening | ORAL | Status: DC

## 2013-09-01 MED ORDER — LEVOTHYROXINE SODIUM 75 MCG PO TABS: 75.0000 ug | ORAL_TABLET | Freq: Every day | ORAL | Status: DC

## 2013-09-01 MED ORDER — VENLAFAXINE HCL ER 150 MG PO CP24: 150.0000 mg | ORAL_CAPSULE | Freq: Every day | ORAL | Status: DC

## 2013-09-06 ENCOUNTER — Ambulatory Visit: Payer: Self-pay | Admitting: Gastroenterology

## 2013-09-27 ENCOUNTER — Other Ambulatory Visit: Payer: Self-pay | Admitting: Internal Medicine

## 2013-09-27 NOTE — Telephone Encounter (Signed)
Refilled clonazepam #60 with one refill.   

## 2013-09-27 NOTE — Telephone Encounter (Signed)
Okay to refill? Last seen in February 2015 

## 2013-11-18 ENCOUNTER — Ambulatory Visit: Payer: Self-pay | Admitting: Internal Medicine

## 2013-11-18 LAB — HM MAMMOGRAPHY: HM Mammogram: NEGATIVE

## 2013-11-22 ENCOUNTER — Encounter: Payer: Self-pay | Admitting: Internal Medicine

## 2013-11-26 ENCOUNTER — Other Ambulatory Visit: Payer: Self-pay | Admitting: *Deleted

## 2013-11-26 NOTE — Telephone Encounter (Signed)
Pt called to report that she only has one tablet remaining on her Clonazepam & realized today that she is out of refills. Pt states she needs this sent in today. Please advise (Dr. Nicki Reaper out of office today)

## 2013-11-28 NOTE — Telephone Encounter (Signed)
I did not see this request until today. I looked back and she received a prescription of 09/27/13 with 1 refill so she should have run out completely on 11/27/13. Cannot do anything about this now. Please give to Dr. Nicki Reaper tomorrow.

## 2013-11-29 ENCOUNTER — Other Ambulatory Visit: Payer: Self-pay | Admitting: *Deleted

## 2013-11-29 ENCOUNTER — Encounter: Payer: Self-pay | Admitting: Internal Medicine

## 2013-11-29 MED ORDER — CLONAZEPAM 1 MG PO TABS: ORAL_TABLET | ORAL | Status: DC

## 2013-11-29 NOTE — Telephone Encounter (Signed)
The patient called this morning requesting her medication . Stated she has been in panic mode all weekend.

## 2013-11-29 NOTE — Telephone Encounter (Signed)
Rx faxed & placed on Dr. Nicki Reaper desk for signature

## 2013-11-29 NOTE — Telephone Encounter (Signed)
Rx faxed to [facility] & pt notified

## 2013-12-01 ENCOUNTER — Other Ambulatory Visit: Payer: Self-pay | Admitting: *Deleted

## 2013-12-01 MED ORDER — VENLAFAXINE HCL ER 150 MG PO CP24: 150.0000 mg | ORAL_CAPSULE | Freq: Every day | ORAL | Status: DC

## 2013-12-13 ENCOUNTER — Encounter: Payer: Self-pay | Admitting: Internal Medicine

## 2013-12-13 ENCOUNTER — Ambulatory Visit (INDEPENDENT_AMBULATORY_CARE_PROVIDER_SITE_OTHER): Payer: Commercial Managed Care - HMO | Admitting: Internal Medicine

## 2013-12-13 VITALS — BP 98/64 | HR 84 | Temp 98.3°F | Resp 16 | Ht 62.5 in | Wt 137.0 lb

## 2013-12-13 DIAGNOSIS — S2231XS Fracture of one rib, right side, sequela: Secondary | ICD-10-CM

## 2013-12-13 DIAGNOSIS — M79604 Pain in right leg: Secondary | ICD-10-CM

## 2013-12-13 DIAGNOSIS — G8929 Other chronic pain: Secondary | ICD-10-CM

## 2013-12-13 DIAGNOSIS — IMO0002 Reserved for concepts with insufficient information to code with codable children: Secondary | ICD-10-CM

## 2013-12-13 DIAGNOSIS — M79609 Pain in unspecified limb: Secondary | ICD-10-CM

## 2013-12-13 DIAGNOSIS — M25569 Pain in unspecified knee: Secondary | ICD-10-CM

## 2013-12-13 DIAGNOSIS — M25562 Pain in left knee: Secondary | ICD-10-CM

## 2013-12-13 MED ORDER — TRAMADOL HCL 50 MG PO TABS: 50.0000 mg | ORAL_TABLET | Freq: Three times a day (TID) | ORAL | Status: DC | PRN

## 2013-12-13 NOTE — Progress Notes (Signed)
Patient ID: Pam Salazar, female   DOB: 01-Apr-1946, 68 y.o.   MRN: [id number]  Patient Active Problem List   Diagnosis Date Noted  . Left knee pain 12/14/2013  . Right leg pain 12/14/2013  . Fracture of rib of right side 08/17/2013  . Great toe pain 08/17/2013  . Pre-op evaluation 08/08/2013  . Personal history of colonic polyps 03/11/2013  . Depression 03/07/2013  . Panic disorder 03/07/2013  . Hypercholesterolemia 03/07/2013  . Hypothyroidism 03/07/2013  . Vitamin D deficiency 03/07/2013  . Skin cancer 03/07/2013    Subjective:  CC:   Chief Complaint  Patient presents with  . Acute Visit    Elbow pain right side   . Knee Pain    on left side behind knee and on right side believes there is screw came loose from plate in ankle.    HPI:   Pam Salazar is a 68 y.o. female who presents for  multiple pain complains, subacute;   Pain behind left knee  For the last several weeks,  Works out daily with elliptical  For 15 min,  Then free weights and squats.  Resolved but recurred after using the leg press last week,. No bruising,  Swelling, no recent travel or immobilation.    Right leg pain below patella,  Had orthopedic surgery in 1993 dor repair of fractured right fibula displaced fracture requiring a brace with screws.  Now notices a movable hard object up near her knee that has recently moved laterally and thnks a screw has migrated prxximally and laterally .  has caused some bruising,    Past Medical History  Diagnosis Date  . Hypercholesterolemia   . Hypothyroidism   . Depression   . Panic disorder     previous agoraphobia  . Vitamin D deficiency     Past Surgical History  Procedure Laterality Date  . Abdominal hysterectomy  1981    partial - secondary to bleeding, ovaries not removed -   . Orif fibula fracture Right 2003    right lower extremitiy  . Tonsillectomy  1974       The following portions of the patient's history were reviewed and updated as  appropriate: Allergies, current medications, and problem list.    Review of Systems:   Patient denies headache, fevers, malaise, unintentional weight loss, skin rash, eye pain, sinus congestion and sinus pain, sore throat, dysphagia,  hemoptysis , cough, dyspnea, wheezing, chest pain, palpitations, orthopnea, edema, abdominal pain, nausea, melena, diarrhea, constipation, flank pain, dysuria, hematuria, urinary  Frequency, nocturia, numbness, tingling, seizures,  Focal weakness, Loss of consciousness,  Tremor, insomnia, depression, anxiety, and suicidal ideation.     History   Social History  . Marital Status: Married    Spouse Name: N/A    Number of Children: N/A  . Years of Education: N/A   Occupational History  . Not on file.   Social History Main Topics  . Smoking status: Former Smoker    Types: Cigarettes  . Smokeless tobacco: Former Systems developer    Quit date: 07/26/2013     Comment: tryin to quit-using gum as a replacement  . Alcohol Use: No  . Drug Use: No  . Sexual Activity: Not on file   Other Topics Concern  . Not on file   Social History Narrative  . No narrative on file    Objective:  Filed Vitals:   12/13/13 1809  BP: 98/64  Pulse: 84  Temp: 98.3 F (36.8 C)  Resp:  16     General appearance: alert, cooperative and appears stated age Ears: normal TM's and external ear canals both ears Throat: lips, mucosa, and tongue normal; teeth and gums normal Neck: no adenopathy, no carotid bruit, supple, symmetrical, trachea midline and thyroid not enlarged, symmetric, no tenderness/mass/nodules Back: symmetric, no curvature. ROM normal. No CVA tenderness. Lungs: clear to auscultation bilaterally Heart: regular rate and rhythm, S1, S2 normal, no murmur, click, rub or gallop Abdomen: soft, non-tender; bowel sounds normal; no masses,  no organomegaly Pulses: 2+ and symmetric Skin: Skin color, texture, turgor normal. No rashes or lesions Lymph nodes: Cervical,  supraclavicular, and axillary nodes normal. Ext: right leg bruised an tibial plateu, mobile subcutaneous mass noted.  Left leg pain in popliteal fossa without bruising or masses,   Assessment and Plan:  Fracture of rib of right side Found during last evaluation. Advised to review DEXA with PCP and check vit d. Level   Left knee pain Subacute,  Rule out Baker's cyst with ultrasound.  Suspect strain fg gracilis or semitendinosis ,  Advised to avoid leg presses and lunges for now.   Right leg pain Suspect foreign body (migration of fib screw/hardware) , plain tib films ordered.    Updated Medication List Outpatient Encounter Prescriptions as of 12/13/2013  Medication Sig  . beta carotene w/minerals (OCUVITE) tablet Take 1 tablet by mouth daily.  . Calcium Carbonate-Vitamin D (CALTRATE 600+D PO) Take 2 tablets by mouth 2 (two) times daily.  . clonazePAM (KLONOPIN) 1 MG tablet TAKE ONE TABLET BY MOUTH TWICE DAILY AS NEEDED FOR ANXIETY  . levothyroxine (SYNTHROID, LEVOTHROID) 75 MCG tablet Take 1 tablet (75 mcg total) by mouth daily before breakfast.  . simvastatin (ZOCOR) 20 MG tablet Take 1 tablet (20 mg total) by mouth every evening.  . venlafaxine XR (EFFEXOR-XR) 150 MG 24 hr capsule Take 1 capsule (150 mg total) by mouth daily.  . cyclobenzaprine (AMRIX) 15 MG 24 hr capsule Take 1 capsule (15 mg total) by mouth daily as needed for muscle spasms.  Marland Kitchen HYDROcodone-acetaminophen (NORCO) 10-325 MG per tablet Take 1 tablet by mouth every 8 (eight) hours as needed.  . traMADol (ULTRAM) 50 MG tablet Take 1 tablet (50 mg total) by mouth every 8 (eight) hours as needed for moderate pain.     Orders Placed This Encounter  Procedures  . DG Tibia/Fibula Right  . Lower Extremity Venous Duplex Left    No Follow-up on file.

## 2013-12-13 NOTE — Progress Notes (Signed)
Pre-visit discussion using our clinic review tool. No additional management support is needed unless otherwise documented below in the visit note.  

## 2013-12-13 NOTE — Patient Instructions (Signed)
Your left knee is probably aching from straining your semitendinosis or gracilis tendon.   The ultrasound maybe able to identify a tear  But will also be able to identify a  Baker's cyst  Tramadol for pain ,  Twice daily,  Can add tylenol to it.  Plain films of right tib/fib to rule out foreign body

## 2013-12-14 ENCOUNTER — Ambulatory Visit: Payer: Self-pay | Admitting: Internal Medicine

## 2013-12-14 DIAGNOSIS — M25562 Pain in left knee: Secondary | ICD-10-CM | POA: Insufficient documentation

## 2013-12-14 DIAGNOSIS — I83811 Varicose veins of right lower extremities with pain: Secondary | ICD-10-CM | POA: Insufficient documentation

## 2013-12-14 NOTE — Assessment & Plan Note (Signed)
Suspect foreign body (migration of fib screw/hardware) , plain tib films ordered.

## 2013-12-14 NOTE — Assessment & Plan Note (Signed)
Advised to review DEXA with PCP

## 2013-12-14 NOTE — Assessment & Plan Note (Signed)
Subacute,  Rule out Baker's cyst with ultrasound.  Suspect strain fg gracilis or semitendinosis ,  Advised to avoid leg presses and lunges for now.

## 2013-12-15 ENCOUNTER — Ambulatory Visit: Payer: Self-pay | Admitting: Internal Medicine

## 2013-12-16 ENCOUNTER — Telehealth: Payer: Self-pay | Admitting: Internal Medicine

## 2013-12-16 DIAGNOSIS — M25562 Pain in left knee: Principal | ICD-10-CM

## 2013-12-16 DIAGNOSIS — G8929 Other chronic pain: Secondary | ICD-10-CM

## 2013-12-27 ENCOUNTER — Telehealth: Payer: Self-pay | Admitting: Internal Medicine

## 2013-12-27 DIAGNOSIS — M25562 Pain in left knee: Secondary | ICD-10-CM

## 2013-12-27 DIAGNOSIS — M79604 Pain in right leg: Secondary | ICD-10-CM

## 2013-12-27 NOTE — Telephone Encounter (Signed)
Please advise 

## 2013-12-27 NOTE — Telephone Encounter (Signed)
Not sure who the referral needs to come from, so I will send this to Dr. Nicki Reaper to decide

## 2013-12-27 NOTE — Telephone Encounter (Signed)
Referral is in process as requested to Ortho

## 2013-12-27 NOTE — Telephone Encounter (Signed)
Pt states she is waiting for referral to be made to Orthopedics.  States she saw Dr. Derrel Nip  A few weeks ago for problems with legs and was told she would be referred.  No ortho referral

## 2013-12-28 NOTE — Telephone Encounter (Signed)
Left message for patient to return call to office. 

## 2013-12-28 NOTE — Telephone Encounter (Signed)
Patient came into office on 12/27/13 and appointment was set up by Lorriane Shire.

## 2013-12-29 ENCOUNTER — Other Ambulatory Visit: Payer: Self-pay | Admitting: Internal Medicine

## 2013-12-29 ENCOUNTER — Encounter: Payer: Self-pay | Admitting: Internal Medicine

## 2013-12-30 NOTE — Telephone Encounter (Signed)
Script faxed.

## 2013-12-30 NOTE — Telephone Encounter (Signed)
Refilled clonazepam #60 with no refills.  Placed on your desk.

## 2013-12-30 NOTE — Telephone Encounter (Signed)
Ok to fill 

## 2014-01-05 ENCOUNTER — Encounter: Payer: Self-pay | Admitting: Internal Medicine

## 2014-01-14 ENCOUNTER — Other Ambulatory Visit (INDEPENDENT_AMBULATORY_CARE_PROVIDER_SITE_OTHER): Payer: Commercial Managed Care - HMO

## 2014-01-14 ENCOUNTER — Encounter: Payer: Self-pay | Admitting: Family Medicine

## 2014-01-14 ENCOUNTER — Ambulatory Visit (INDEPENDENT_AMBULATORY_CARE_PROVIDER_SITE_OTHER): Payer: Commercial Managed Care - HMO | Admitting: Family Medicine

## 2014-01-14 VITALS — BP 102/70 | HR 84 | Ht 62.5 in | Wt 136.0 lb

## 2014-01-14 DIAGNOSIS — M79604 Pain in right leg: Secondary | ICD-10-CM

## 2014-01-14 DIAGNOSIS — M79609 Pain in unspecified limb: Secondary | ICD-10-CM

## 2014-01-14 DIAGNOSIS — M7122 Synovial cyst of popliteal space [Baker], left knee: Secondary | ICD-10-CM

## 2014-01-14 DIAGNOSIS — M7700 Medial epicondylitis, unspecified elbow: Secondary | ICD-10-CM

## 2014-01-14 DIAGNOSIS — M7120 Synovial cyst of popliteal space [Baker], unspecified knee: Secondary | ICD-10-CM | POA: Insufficient documentation

## 2014-01-14 DIAGNOSIS — M7989 Other specified soft tissue disorders: Secondary | ICD-10-CM | POA: Insufficient documentation

## 2014-01-14 DIAGNOSIS — M79605 Pain in left leg: Principal | ICD-10-CM

## 2014-01-14 DIAGNOSIS — M7701 Medial epicondylitis, right elbow: Secondary | ICD-10-CM | POA: Insufficient documentation

## 2014-01-14 NOTE — Assessment & Plan Note (Signed)
Very mild at this time. Home exercise program given, icing protocol, and we discussed over-the-counter medications that could be beneficial. Patient will followup again if having worsening pain.

## 2014-01-14 NOTE — Progress Notes (Signed)
[name] [address], [place] [address] Phone: [phone] Subjective:    I'm seeing this patient by the request  of:  SCOTT,CHARLENE S, MD [place]  CC: Right leg mass, left knee discomfort, right elbow pain  [id number] Pam Salazar is a 68 y.o. female coming in with complaint of 3 distinct problems. 1. right leg mass. Patient has had this for multiple years. Patient stated that it seems to be getting a little bit bigger over the course of time. Patient states that there is no real pain but it is freely movable. No erythema no injury. Patient though does have a past medical history needing a ORIF of the fibular fracture previously. The area of where this mass is is right at the incision. Patient states it has been uncomfortable from time to time but now is not. Patient did have x-rays which she brought today and was reviewed by me. X-rays of the area did not show any hardware loosening at all. Denies any nighttime awakening. 2.  intermittent left knee pain. Patient states that this is intermittent and more of a dull aching sensation. Only occurs seldomly. Does not stop her from any activity. Patient did have an ultrasound and was found to have a Baker cyst. Patient is here for further evaluation of the Baker cyst. Today is not having any swelling and no pain. Able to do all activities daily without any significant trouble. Rates the discomfort 2/10. 3.  elbow pain. This is right-sided. States that it is also intermittent. Only hurts with certain activities on the medial aspect of the elbow. Does not do any repetitive motion but does work out frequently. Patient can notice it when she lifts weights. Denies any radiation of pain any numbness or weakness. More once again of a dull aching sensation. Rates the severity of 3/10.     Past medical history, social, surgical and family history all reviewed in electronic medical record.   Review of Systems: No  headache, visual changes, nausea, vomiting, diarrhea, constipation, dizziness, abdominal pain, skin rash, fevers, chills, night sweats, weight loss, swollen lymph nodes, body aches, joint swelling, muscle aches, chest pain, shortness of breath, mood changes.   Objective Blood pressure 102/70, pulse 84, height 5' 2.5" (1.588 m), weight 136 lb (61.689 kg), SpO2 98.00%.  General: No apparent distress alert and oriented x3 mood and affect normal, dressed appropriately.  HEENT: Pupils equal, extraocular movements intact  Respiratory: Patient's speak in full sentences and does not appear short of breath  Cardiovascular: No lower extremity edema, non tender, no erythema  Skin: Warm dry intact with no signs of infection or rash on extremities or on axial skeleton.  Abdomen: Soft nontender  Neuro: Cranial nerves II through XII are intact, neurovascularly intact in all extremities with 2+ DTRs and 2+ pulses.  Lymph: No lymphadenopathy of posterior or anterior cervical chain or axillae bilaterally.  Gait normal with good balance and coordination.  MSK:  Non tender with full range of motion and good stability and symmetric strength and tone of shoulders, , wrist, hip, knee and ankles bilaterally.  Elbow: Right Unremarkable to inspection. Range of motion full pronation, supination, flexion, extension. Strength is full to all of the above directions Stable to varus, valgus stress. Negative moving valgus stress test. Minimal tenderness over the medial epicondylar region Ulnar nerve does not sublux. Negative cubital tunnel Tinel's. Contralateral elbow unremarkable Knee: Left Normal to inspection with no erythema or effusion or obvious bony  abnormalities. Palpation normal with no warmth, joint line tenderness, patellar tenderness, or condyle tenderness. ROM full in flexion and extension and lower leg rotation. Ligaments with solid consistent endpoints including ACL, PCL, LCL, MCL. Negative Mcmurray's,  Apley's, and Thessalonian tests. Non painful patellar compression. Patellar glide without crepitus. Patellar and quadriceps tendons unremarkable. Hamstring and quadriceps strength is normal.  Contralateral knee is unremarkable. On patient's right leg though patient on the anterior lateral aspect just above the incision line from previous surgery and below the fibular head this area of mild fluctuance. This is nontender on exam. Measures approximately dime size. This is compressible and freely movable in the skin. No erythema or redness or signs of infection noted. Neurovascular intact proximally as well as distally.  MSK US performed of: Left knee and right leg This study was ordered, performed, and interpreted by Charlann Boxer D.O.  Knee: Left All structures visualized. Anteromedial, anterolateral, posteromedial, and posterolateral menisci unremarkable without tearing, fraying, effusion, or displacement. Patient does have a Baker cyst that is present. This is fairly small less than 2 cm in diameter. No signs of rupture at this time. Patellar Tendon unremarkable on long and transverse views without effusion. No abnormality of prepatellar bursa. LCL and MCL unremarkable on long and transverse views. No abnormality of origin of medial or lateral head of the gastrocnemius.  Patient's right leg the superficial region just above the dermis has what appears to be a epidermal cyst with some calcific surrounding sac. Only fluid within it that is compressible. No blood flow noted. No mass noted.  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

## 2014-01-14 NOTE — Assessment & Plan Note (Signed)
The patient does have a very small Baker cyst noted of the left knee. We discussed or going to continue to monitor very closely. Patient has any swelling or any discomfort she'll come back again for further evaluation. In the interim we discussed the possibility of compression sleeve and she'll book for one over-the-counter. We discussed an icing protocol. Other than that if necessary with any type of swelling she will come back for aspiration and injection. Her knee otherwise is doing remarkably well.

## 2014-01-14 NOTE — Patient Instructions (Signed)
Very nice to meet you.  Ice 20 minutes 2 times daily. Usually after activity and before bed. Exercises 3 times a week with the elbow.  Your knee on left has a baker cyst but small.  Get a compression sleeve.  For the right leg it looks exactly like a cyst.  Would monitor it.  If worsens or you want it drained then come back  Come back and see me when you need me.

## 2014-01-14 NOTE — Assessment & Plan Note (Signed)
Appears to be more of an epidermal cyst. There is only fluid in the area. Discussed with patient at great length. The likelihood of potential cancer is very minimal which is patient's main concern. I did discuss with her if she wanted we could aspirate and send him to the lab but patient declined today. We discussed with her not causing pain and no significant changes she likely will be fine. Patient will to monitor it and if she notices any changes and she'll come back for further evaluation and treatment. Patient has any worsening pain as well I would also consider an MRI but I do not think that this is likely necessary. Patient can do compression and she would like. We will continue to monitor did have patient followup again.

## 2014-01-19 ENCOUNTER — Telehealth: Payer: Self-pay | Admitting: *Deleted

## 2014-01-19 MED ORDER — CLONAZEPAM 1 MG PO TABS: ORAL_TABLET | ORAL | Status: DC

## 2014-01-19 NOTE — Telephone Encounter (Signed)
Pt called states she is changing pharmacies to [facility] on [address].  She is requesting Clonazepam refill.  Last refill 7.9.15.  Last Ov 6.22.15.  Please advise refill

## 2014-01-19 NOTE — Telephone Encounter (Signed)
Ok to refill clonazepam x 1

## 2014-02-02 ENCOUNTER — Ambulatory Visit (INDEPENDENT_AMBULATORY_CARE_PROVIDER_SITE_OTHER): Payer: Commercial Managed Care - HMO | Admitting: Internal Medicine

## 2014-02-02 ENCOUNTER — Encounter: Payer: Self-pay | Admitting: Internal Medicine

## 2014-02-02 VITALS — BP 90/60 | HR 83 | Temp 97.8°F | Ht 62.5 in | Wt 134.8 lb

## 2014-02-02 DIAGNOSIS — F410 Panic disorder [episodic paroxysmal anxiety] without agoraphobia: Secondary | ICD-10-CM

## 2014-02-02 DIAGNOSIS — F32A Depression, unspecified: Secondary | ICD-10-CM

## 2014-02-02 DIAGNOSIS — F3289 Other specified depressive episodes: Secondary | ICD-10-CM

## 2014-02-02 DIAGNOSIS — M7120 Synovial cyst of popliteal space [Baker], unspecified knee: Secondary | ICD-10-CM

## 2014-02-02 DIAGNOSIS — M7122 Synovial cyst of popliteal space [Baker], left knee: Secondary | ICD-10-CM

## 2014-02-02 DIAGNOSIS — E039 Hypothyroidism, unspecified: Secondary | ICD-10-CM

## 2014-02-02 DIAGNOSIS — E7800 Pure hypercholesterolemia, unspecified: Secondary | ICD-10-CM

## 2014-02-02 DIAGNOSIS — Z860100 Personal history of colon polyps, unspecified: Secondary | ICD-10-CM

## 2014-02-02 DIAGNOSIS — F329 Major depressive disorder, single episode, unspecified: Secondary | ICD-10-CM

## 2014-02-02 DIAGNOSIS — E559 Vitamin D deficiency, unspecified: Secondary | ICD-10-CM

## 2014-02-02 NOTE — Progress Notes (Signed)
Pre visit review using our clinic review tool, if applicable. No additional management support is needed unless otherwise documented below in the visit note. 

## 2014-02-06 ENCOUNTER — Telehealth: Payer: Self-pay | Admitting: Internal Medicine

## 2014-02-06 ENCOUNTER — Encounter: Payer: Self-pay | Admitting: Internal Medicine

## 2014-02-06 NOTE — Assessment & Plan Note (Addendum)
Colonoscopy as outlined.  No bowel change or bleeding now.  Follow.  She was referred last visit to GI (Dr Allen Norris).  Apparently had colonoscopy (per note).  Needs results.

## 2014-02-06 NOTE — Assessment & Plan Note (Signed)
Follow vitamin D level.  

## 2014-02-06 NOTE — Assessment & Plan Note (Signed)
On simvastatin.  Low cholesterol diet and exercise.  Follow lipid panel and liver function.      

## 2014-02-06 NOTE — Telephone Encounter (Signed)
Saw Dr Allen Norris in 3/15.  Per note, was going to perform colonoscopy.  Needs last colonoscopy and notes - Dr Allen Norris.   Thanks.

## 2014-02-06 NOTE — Assessment & Plan Note (Signed)
Stable.  Follow.  Saw Dr Tamala Julian.

## 2014-02-06 NOTE — Assessment & Plan Note (Signed)
Better.  No agoraphobia.  Anxiety better.  On effexor.  Follow.

## 2014-02-06 NOTE — Assessment & Plan Note (Signed)
Doing well on effexor.  Follow.  Feels better.

## 2014-02-06 NOTE — Progress Notes (Signed)
Subjective:    Patient ID: Pam Salazar, female    DOB: 1946/03/04, 68 y.o.   MRN: [id number]  HPI 68 year old female with past history of depression, hypercholesterolemia and hypothyroidism who comes in today for a scheduled follow up.   She reports having a history of depression and panic disorder with associated agoraphobia.  The panic disorder and agoraphobia are better.  [facility] and [facility], etc.  On effexor for her depression.  Stress is better.  Her friend is doing better.  Not smoking.  Tries to stay active.  No cardiac symptoms with increased activity or exertion.  Breathing stable.  No nausea or vomiting.  No bowel change.  Seeing Dr Phillip Heal for a "pre melanoma" skin lesion.  Is s/p removal.  Recently saw Dr Tamala Julian.  Has a Bakers cyst.  Stable.  Desires not further intervention.  Overall she feels she is doing well.       Past Medical History  Diagnosis Date  . Hypercholesterolemia   . Hypothyroidism   . Depression   . Panic disorder     previous agoraphobia  . Vitamin D deficiency     Outpatient Encounter Prescriptions as of 02/02/2014  Medication Sig  . clonazePAM (KLONOPIN) 1 MG tablet TAKE ONE TABLET BY MOUTH TWICE DAILY AS NEEDED for anxiety  . levothyroxine (SYNTHROID, LEVOTHROID) 75 MCG tablet Take 1 tablet (75 mcg total) by mouth daily before breakfast.  . simvastatin (ZOCOR) 20 MG tablet Take 1 tablet (20 mg total) by mouth every evening.  . venlafaxine XR (EFFEXOR-XR) 150 MG 24 hr capsule Take 1 capsule (150 mg total) by mouth daily.  . [DISCONTINUED] beta carotene w/minerals (OCUVITE) tablet Take 1 tablet by mouth daily.  . [DISCONTINUED] Calcium Carbonate-Vitamin D (CALTRATE 600+D PO) Take 2 tablets by mouth 2 (two) times daily.  . [DISCONTINUED] cyclobenzaprine (AMRIX) 15 MG 24 hr capsule Take 1 capsule (15 mg total) by mouth daily as needed for muscle spasms.  . [DISCONTINUED] HYDROcodone-acetaminophen (NORCO) 10-325 MG per tablet Take 1 tablet by  mouth every 8 (eight) hours as needed.  . [DISCONTINUED] traMADol (ULTRAM) 50 MG tablet Take 1 tablet (50 mg total) by mouth every 8 (eight) hours as needed for moderate pain.    Review of Systems Patient denies any headache, lightheadedness or dizziness.  No sinus or allergy symptoms.  No chest pain, tightness or palpitations.  No increased shortness of breath, cough or congestion.  No nausea or vomiting.  No acid reflux.  No abdominal pain or cramping.  No bowel change, such as diarrhea or melana.  No urine change.   Is s/p ORIF right lower extremity.  Overall has done well.  Now on effexor. This is working well for her now.  Stress better.  Overall feels good.       Objective:   Physical Exam  Filed Vitals:   02/02/14 1003  BP: 90/60  Pulse: 83  Temp: 97.8 F (36.6 C)   Blood pressure recheck:  102/60, pulse 58  68 year old female in no acute distress.   HEENT:  Nares- clear.  Oropharynx - without lesions. NECK:  Supple.  Nontender.  No audible bruit.  HEART:  Appears to be regular. LUNGS:  No crackles or wheezing audible.  Respirations even and unlabored.  RADIAL PULSE:  Equal bilaterally.    ABDOMEN:  Soft, nontender.  Bowel sounds present and normal.  No audible abdominal bruit.   EXTREMITIES:  No increased edema  present.  DP pulses palpable and equal bilaterally.          Assessment & Plan:  HEALTH MAINTENANCE.  Physical 08/03/13.  Mammogram 11/18/13 - Birads I.  Colonoscopy per note, just repeated.  Needs results.

## 2014-02-06 NOTE — Assessment & Plan Note (Signed)
On levothyroxine.  Follow tsh.  

## 2014-02-07 NOTE — Telephone Encounter (Signed)
Requested notes via epic

## 2014-02-10 NOTE — Telephone Encounter (Signed)
Requested records via fax.

## 2014-02-11 NOTE — Telephone Encounter (Signed)
Will review when return to office.   

## 2014-02-11 NOTE — Telephone Encounter (Signed)
See below

## 2014-02-11 NOTE — Telephone Encounter (Signed)
Records received & placed in your folder

## 2014-02-14 ENCOUNTER — Encounter: Payer: Self-pay | Admitting: Internal Medicine

## 2014-02-22 ENCOUNTER — Encounter: Payer: Self-pay | Admitting: Internal Medicine

## 2014-03-21 ENCOUNTER — Other Ambulatory Visit: Payer: Self-pay | Admitting: *Deleted

## 2014-03-21 DIAGNOSIS — Z0100 Encounter for examination of eyes and vision without abnormal findings: Secondary | ICD-10-CM

## 2014-03-21 MED ORDER — SIMVASTATIN 20 MG PO TABS: 20.0000 mg | ORAL_TABLET | Freq: Every evening | ORAL | Status: DC

## 2014-03-21 MED ORDER — VENLAFAXINE HCL ER 150 MG PO CP24: 150.0000 mg | ORAL_CAPSULE | Freq: Every day | ORAL | Status: DC

## 2014-03-21 MED ORDER — CLONAZEPAM 1 MG PO TABS: ORAL_TABLET | ORAL | Status: DC

## 2014-03-21 NOTE — Telephone Encounter (Signed)
rx ok'd for clonazepam #60 with one refill.  Also place the order for the referral to [facility].

## 2014-03-21 NOTE — Telephone Encounter (Signed)
Pt called requesting a refill on Clonazepam to [facility] on [address] & needs a referral to Dr. Murvin Natal @ [facility]. Has an upcoming appt in [address].

## 2014-03-21 NOTE — Addendum Note (Signed)
Addended by: Wynonia Lawman E on: 03/21/2014 12:36 PM   Modules accepted: Orders

## 2014-03-21 NOTE — Telephone Encounter (Signed)
Rx faxed to [facility]

## 2014-04-12 ENCOUNTER — Ambulatory Visit (INDEPENDENT_AMBULATORY_CARE_PROVIDER_SITE_OTHER)
Admission: RE | Admit: 2014-04-12 | Discharge: 2014-04-12 | Disposition: A | Discharge status: Home / Self Care | Payer: Commercial Managed Care - HMO | Source: Ambulatory Visit | Attending: Family Medicine | Admitting: Family Medicine

## 2014-04-12 ENCOUNTER — Ambulatory Visit (INDEPENDENT_AMBULATORY_CARE_PROVIDER_SITE_OTHER): Payer: Commercial Managed Care - HMO | Admitting: Family Medicine

## 2014-04-12 ENCOUNTER — Encounter: Payer: Self-pay | Admitting: Family Medicine

## 2014-04-12 ENCOUNTER — Other Ambulatory Visit (INDEPENDENT_AMBULATORY_CARE_PROVIDER_SITE_OTHER): Payer: Commercial Managed Care - HMO

## 2014-04-12 VITALS — BP 102/68 | HR 82 | Ht 62.5 in | Wt 138.0 lb

## 2014-04-12 DIAGNOSIS — M545 Low back pain, unspecified: Secondary | ICD-10-CM | POA: Insufficient documentation

## 2014-04-12 DIAGNOSIS — M76899 Other specified enthesopathies of unspecified lower limb, excluding foot: Secondary | ICD-10-CM | POA: Insufficient documentation

## 2014-04-12 DIAGNOSIS — M25562 Pain in left knee: Secondary | ICD-10-CM

## 2014-04-12 DIAGNOSIS — M25551 Pain in right hip: Secondary | ICD-10-CM

## 2014-04-12 DIAGNOSIS — M769 Unspecified enthesopathy, lower limb, excluding foot: Secondary | ICD-10-CM

## 2014-04-12 NOTE — Patient Instructions (Signed)
Good to see you Ice 20 minutes to knee after activity.   Xrays of back and hip today.  Alternate quadriceps and back exercises daily.  Vitamin D 4000 IU daily.  Consider a compression sleeve to the right knee with activity.  Ibuprofen I gave you 3 times a day for 6 days.  Come back in 7-10 days.

## 2014-04-12 NOTE — Assessment & Plan Note (Signed)
Patient does have moderate quadriceps tendinitis is likely from healing of the patellar fracture the patient had multiple weeks ago. Patient does have full range of movement of the knee. Patient is able to ambulate well. Patient told to get a compression dressing, icing, and we discussed some exercises and patient was given a handout. We discussed vitamin D supplementation to help with the healing. Patient will try these interventions and come back and see me again in 3 weeks to make sure the patient is healing appropriately.

## 2014-04-12 NOTE — Assessment & Plan Note (Signed)
Additionally the patient's back pain could be unfortunately actually referred pain from her right hip on exam today. I would like to get x-rays of her hip. Patient's back pain does seem to be muscular in nature and we discussed anti-inflammatories, icing protocol, and given home exercise program. We will get back x-rays rule out any lumbar pathology. Patient come back in 7-10 days to make sure she makes some improvement.

## 2014-04-12 NOTE — Progress Notes (Signed)
[name] [address] [place], [place] [address] Phone: [phone] Subjective:    CC: low back pain and left knee pain.   IEP:[id number] Pam Salazar is a 68 y.o. female coming in with complaint of  2 problems.   1.  low back pain- mild overall seemed to go to her ight hip.  Seem musculature. Mild aching but getting worse, not stopping her from activity. No radiation down the leg, no numbness or weakness, notice though mild groin pain with deep squatting. Rates severity 5/10, no nighttime awakening.    2. left knee pain- hurts after falling on knee 2 months, ago, mild swelling initially and then seemed to improve.  Patient states that when she is doing jumping or kneeling on his knee she still has pain on the anterior aspect of the knee. Patient denies any more swelling and denies any clicking or popping. Patient states sitting for long amount of time gets her discomfort when she tries to get out of a chair.      Past medical history, social, surgical and family history all reviewed in electronic medical record.   Review of Systems: No headache, visual changes, nausea, vomiting, diarrhea, constipation, dizziness, abdominal pain, skin rash, fevers, chills, night sweats, weight loss, swollen lymph nodes, body aches, joint swelling, muscle aches, chest pain, shortness of breath, mood changes.   Objective Blood pressure 102/68, pulse 82, height 5' 2.5" (1.588 m), weight 138 lb (62.596 kg).  General: No apparent distress alert and oriented x3 mood and affect normal, dressed appropriately.  HEENT: Pupils equal, extraocular movements intact  Respiratory: Patient's speak in full sentences and does not appear short of breath  Cardiovascular: No lower extremity edema, non tender, no erythema  Skin: Warm dry intact with no signs of infection or rash on extremities or on axial skeleton.  Abdomen: Soft nontender  Neuro: Cranial nerves II through XII are intact,  neurovascularly intact in all extremities with 2+ DTRs and 2+ pulses.  Lymph: No lymphadenopathy of posterior or anterior cervical chain or axillae bilaterally.  Gait normal with good balance and coordination.  MSK:  Non tender with full range of motion and good stability and symmetric strength and tone of shoulders, , wrist, hip, knee and ankles bilaterally.   Back Exam:  Inspection: Unremarkable  Motion: Flexion 45 deg, Extension 45 deg, Side Bending to 45 deg bilaterally,  Rotation to 45 deg bilaterally  SLR laying: Negative  XSLR laying: Negative  Palpable tenderness: None. FABER: Pain on lateral aspect of the hip as well as the groin. Positive FADIR Sensory change: Gross sensation intact to all lumbar and sacral dermatomes.  Reflexes: 2+ at both patellar tendons, 2+ at achilles tendons, Babinski's downgoing.  Strength at foot  Plantar-flexion: 5/5 Dorsi-flexion: 5/5 Eversion: 5/5 Inversion: 5/5  Leg strength  Quad: 5/5 Hamstring: 5/5 Hip flexor: 5/5 Hip abductors: 5/5  Gait unremarkable.   Knee: Left Normal to inspection with no erythema or effusion or obvious bony abnormalities. Palpation normal with no warmth, joint line tenderness, mild patellar tenderness, no condyle tenderness. ROM full in flexion and extension and lower leg rotation. Ligaments with solid consistent endpoints including ACL, PCL, LCL, MCL. Negative Mcmurray's, Apley's, and Thessalonian tests. Non painful patellar compression. Patellar glide without crepitus. Patellar and quadriceps tendons unremarkable. Hamstring and quadriceps strength is normal.  Contralateral knee is unremarkable.   MSK US performed of: Left knee a This study was ordered, performed, and interpreted by Charlann Boxer D.O.  Knee: Left All structures visualized. Anteromedial, anterolateral, posteromedial, and posterolateral menisci unremarkable without tearing, fraying, effusion, or displacement. Patient does have a Baker cyst that is  present. Patellar Tendon unremarkable on long and transverse views without effusion. Patient's patella though at the superior aspect does have what appears to be a bone spur noted. This is likely a callus formation secondary to previous injury. Increasing upper flow noted. Mild quadriceps tendinitis noted No abnormality of prepatellar bursa. LCL and MCL unremarkable on long and transverse views. No abnormality of origin of medial or lateral head of the gastrocnemius.  Patient's right leg the superficial region just above the dermis has what appears to be a epidermal cyst with some calcific surrounding sac. Only fluid within it that is compressible. No blood flow noted. No mass noted.  IMPRESSION: mild patellar fracture and quad tendonitis.      Impression and Recommendations:     This case required medical decision making of moderate complexity.

## 2014-04-14 ENCOUNTER — Telehealth: Payer: Self-pay | Admitting: *Deleted

## 2014-04-14 NOTE — Telephone Encounter (Signed)
Left msg on triage requesting xray results done 04/12/14...Johny Chess

## 2014-04-15 ENCOUNTER — Encounter: Payer: Self-pay | Admitting: Family Medicine

## 2014-04-15 NOTE — Telephone Encounter (Signed)
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  Nothing severe.

## 2014-04-15 NOTE — Telephone Encounter (Signed)
Called pt no answer LMOM with md response.../lmb 

## 2014-04-21 ENCOUNTER — Ambulatory Visit (INDEPENDENT_AMBULATORY_CARE_PROVIDER_SITE_OTHER): Payer: Commercial Managed Care - HMO | Admitting: Family Medicine

## 2014-04-21 ENCOUNTER — Other Ambulatory Visit (INDEPENDENT_AMBULATORY_CARE_PROVIDER_SITE_OTHER): Payer: Commercial Managed Care - HMO

## 2014-04-21 ENCOUNTER — Encounter: Payer: Self-pay | Admitting: *Deleted

## 2014-04-21 ENCOUNTER — Encounter: Payer: Self-pay | Admitting: Family Medicine

## 2014-04-21 VITALS — BP 96/62 | HR 101 | Ht 62.5 in | Wt 137.0 lb

## 2014-04-21 DIAGNOSIS — M25551 Pain in right hip: Secondary | ICD-10-CM

## 2014-04-21 DIAGNOSIS — M76891 Other specified enthesopathies of right lower limb, excluding foot: Secondary | ICD-10-CM

## 2014-04-21 DIAGNOSIS — G5701 Lesion of sciatic nerve, right lower limb: Secondary | ICD-10-CM

## 2014-04-21 DIAGNOSIS — M65851 Other synovitis and tenosynovitis, right thigh: Secondary | ICD-10-CM

## 2014-04-21 DIAGNOSIS — M76899 Other specified enthesopathies of unspecified lower limb, excluding foot: Secondary | ICD-10-CM | POA: Insufficient documentation

## 2014-04-21 MED ORDER — IBUPROFEN-FAMOTIDINE 800-26.6 MG PO TABS: 1.0000 | ORAL_TABLET | Freq: Three times a day (TID) | ORAL | Status: DC

## 2014-04-21 NOTE — Assessment & Plan Note (Signed)
Patient does have hip flexor tendinitis it appears on ultrasound but only mild. I do not feel an injection is necessary at this time. No signs of infection. Differential also includes a labral tear in the joint itself but patient ultrasound is unremarkable. If continued have pain we will consider further advanced imaging. Patient given home exercises, continued oral anti-inflammatories and warned of potential side effects. Discussed icing protocol. Patient was given a note to allow her to start going back to the gym but we'll decrease the amount of weight that she is lifting regularly. Patient will try these interventions and come back and see me again in 3 weeks for further evaluation.  Spent greater than 25 minutes with patient face-to-face and had greater than 50% of counseling including as described above in assessment and plan.

## 2014-04-21 NOTE — Assessment & Plan Note (Signed)
Piriformis Syndrome  Using an anatomical model, reviewed with the patient the structures involved and how they related to diagnosis. The patient indicated understanding.   The patient was given a handout from Dr. Rouzier's book "The Sports Medicine Patient Advisor" describing the anatomy and rehabilitation of the following condition: Piriformis Syndrome  Also given a handout with more extensive Piriformis stretching, hip flexor and abductor strengthening, ham stretching  Rec deep massage, explained self-massage with ball RTC in 3 weeks.  

## 2014-04-21 NOTE — Progress Notes (Signed)
[name] [address] [place], [place] [address] Phone: [phone] Subjective:    CC: left knee pain follow up   [id number] Pam Salazar is a 68 y.o. female coming in with complaint of  2 problems.   1.  low back pain/ right hip, patient was found to have significant groin pain with internal rotation of the right hip. Patient did have x-rays of the right hip that did show mild arthritis and mild spurring of the femoral head. Patient states in for/he continues to have pain. Patient states when she tries to lift her leg she did have a sharp pain in her groin. States that going up stairs can give her difficulty. 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 Denies any radiation down the leg. States that radiation no does occur in her buttocks. Seems to be more localized around the right groin and the right buttocks area.  2. left knee pain- patient states that her left knee is feeling significantly better. Patient has been doing the exercises regularly. Patient has been wearing the brace. Patient denies any pain with compression on the knee. Denies any swelling. Patient is very happy with the results.      Past medical history, social, surgical and family history all reviewed in electronic medical record.   Review of Systems: No headache, visual changes, nausea, vomiting, diarrhea, constipation, dizziness, abdominal pain, skin rash, fevers, chills, night sweats, weight loss, swollen lymph nodes, body aches, joint swelling, muscle aches, chest pain, shortness of breath, mood changes.   Objective Blood pressure 96/62, pulse 101, height 5' 2.5" (1.588 m), weight 137 lb (62.143 kg), SpO2 98.00%.  General: No apparent distress alert and oriented x3 mood and affect normal, dressed appropriately.  HEENT: Pupils equal, extraocular movements intact  Respiratory: Patient's speak in full sentences and does not appear short of breath    Cardiovascular: No lower extremity edema, non tender, no erythema  Skin: Warm dry intact with no signs of infection or rash on extremities or on axial skeleton.  Abdomen: Soft nontender  Neuro: Cranial nerves II through XII are intact, neurovascularly intact in all extremities with 2+ DTRs and 2+ pulses.  Lymph: No lymphadenopathy of posterior or anterior cervical chain or axillae bilaterally.  Gait normal with good balance and coordination.  MSK:  Non tender with full range of motion and good stability and symmetric strength and tone of shoulders, , wrist, hip, knee and ankles bilaterally.   Back Exam:  Inspection: Unremarkable  Motion: Flexion 45 deg, Extension 45 deg, Side Bending to 45 deg bilaterally,  Rotation to 45 deg bilaterally  SLR laying: Negative  XSLR laying: Negative  Palpable tenderness: None. FABER: Pain on lateral aspect of the hip as well as the groin. Positive FADIR Sensory change: Gross sensation intact to all lumbar and sacral dermatomes.  Reflexes: 2+ at both patellar tendons, 2+ at achilles tendons, Babinski's downgoing.  Strength at foot  Plantar-flexion: 5/5 Dorsi-flexion: 5/5 Eversion: 5/5 Inversion: 5/5  Leg strength  Quad: 5/5 Hamstring: 5/5 Hip flexor: 4/5 right hip compared to 5 out of 5 with left hip. Hip abductors: 4/5 but symmetric Gait unremarkable.   Knee: Left Normal to inspection with no erythema or effusion or obvious bony abnormalities. Palpation normal with no warmth, joint line tenderness, mild patellar tenderness, no condyle tenderness. ROM full in flexion and extension and lower leg rotation. Ligaments with solid consistent endpoints including ACL, PCL, LCL, MCL.  Negative Mcmurray's, Apley's, and Thessalonian tests. Non painful patellar compression. Patellar glide without crepitus. Patellar and quadriceps tendons unremarkable. Hamstring and quadriceps strength is normal.  Contralateral knee is unremarkable.   MSK US performed of:  right hip This study was ordered, performed, and interpreted by Charlann Boxer D.O.  Hip: Trochanteric bursa without swelling or effusion. Acetabular labrum visualized and without tears, displacement, or effusion in joint. Femoral neck appears unremarkable without increased power doppler signal along Cortex. Mild hypoechoic changes in with increasing Doppler flow noted around the hip flexor tendon near its insertion.  IMPRESSION:  Mild hip flexor tendinitis and no true tear.      Impression and Recommendations:     This case required medical decision making of moderate complexity.

## 2014-04-21 NOTE — Patient Instructions (Signed)
Good to see you.  No static stretching before hand.  Hip flexor tightness. Pirformis syndrome. Alternate these other handouts.  Eat within 30 minutes of working out. Whey protein isolate with milk and fruit.  At the gym-  No stretching before only dynamic warm-up Drop weight to 50% of what we were doing and increase 10 % a week. No deep squats or lunges for 2 weeks.  OK to bike and swim and elliptical no treadmill Main stretch is one knee down, one up tilt pelvis forwards, feel it in groin and back.  Then twist upper body toward up leg. Hold 10 seconds repeat and do both sides.  Sent in [place] for you today.  See me again in 2-3 weeks.

## 2014-05-12 ENCOUNTER — Ambulatory Visit (INDEPENDENT_AMBULATORY_CARE_PROVIDER_SITE_OTHER): Payer: Commercial Managed Care - HMO | Admitting: Family Medicine

## 2014-05-12 ENCOUNTER — Encounter: Payer: Self-pay | Admitting: Family Medicine

## 2014-05-12 VITALS — BP 96/64 | HR 86 | Ht 62.5 in | Wt 137.0 lb

## 2014-05-12 DIAGNOSIS — M65851 Other synovitis and tenosynovitis, right thigh: Secondary | ICD-10-CM

## 2014-05-12 DIAGNOSIS — M76891 Other specified enthesopathies of right lower limb, excluding foot: Secondary | ICD-10-CM

## 2014-05-12 NOTE — Assessment & Plan Note (Signed)
Patient is doing significantly better at this time. Encourage her to continue the exercises 3 times a week for 6 weeks and did give her an exercise prescription to start doing more lower extremity working out. We also discussed icing regimen and how this will be beneficial as well as the possibility of a compression sleeve. Patient and will follow-up again in 6 weeks for further evaluation.  Spent greater than 25 minutes with patient face-to-face and had greater than 50% of counseling including as described above in assessment and plan.

## 2014-05-12 NOTE — Progress Notes (Signed)
  [name] [address] [place], [place] [address] Phone: [phone] Subjective:    CC: Right hip pain follow-up  [id number] Pam Salazar is a 68 y.o. female coming in with complaint of  right hip pain.   1.  low back pain/ right hip, patient was found to have significant groin pain.  Patient was seen previously did have a hip flexor strain. Patient states as long as she does he exercises on a regular basis she does not have any significant pain. Patient states that she is 85-90% better. Able to do all activities of daily living and has started going to the gym on a regular basis. Patient denies any new symptoms.        Past medical history, social, surgical and family history all reviewed in electronic medical record.   Review of Systems: No headache, visual changes, nausea, vomiting, diarrhea, constipation, dizziness, abdominal pain, skin rash, fevers, chills, night sweats, weight loss, swollen lymph nodes, body aches, joint swelling, muscle aches, chest pain, shortness of breath, mood changes.   Objective Blood pressure 96/64, pulse 86, height 5' 2.5" (1.588 m), weight 137 lb (62.143 kg), SpO2 97 %.  General: No apparent distress alert and oriented x3 mood and affect normal, dressed appropriately.  HEENT: Pupils equal, extraocular movements intact  Respiratory: Patient's speak in full sentences and does not appear short of breath  Cardiovascular: No lower extremity edema, non tender, no erythema  Skin: Warm dry intact with no signs of infection or rash on extremities or on axial skeleton.  Abdomen: Soft nontender  Neuro: Cranial nerves II through XII are intact, neurovascularly intact in all extremities with 2+ DTRs and 2+ pulses.  Lymph: No lymphadenopathy of posterior or anterior cervical chain or axillae bilaterally.  Gait normal with good balance and coordination.  MSK:  Non tender with full range of motion and good stability and symmetric  strength and tone of shoulders, , wrist, hip, knee and ankles bilaterally.   Back Exam:  Inspection: Unremarkable  Motion: Flexion 45 deg, Extension 45 deg, Side Bending to 45 deg bilaterally,  Rotation to 45 deg bilaterally  SLR laying: Negative  XSLR laying: Negative  Palpable tenderness: None. FABER: Pain on lateral aspect of the hip as well as the groin. Negative FADIR Sensory change: Gross sensation intact to all lumbar and sacral dermatomes.  Reflexes: 2+ at both patellar tendons, 2+ at achilles tendons, Babinski's downgoing.  Strength at foot  Plantar-flexion: 5/5 Dorsi-flexion: 5/5 Eversion: 5/5 Inversion: 5/5  Leg strength  Quad: 5/5 Hamstring: 5/5 Hip flexor: 5/5 right hip compared to 5 out of 5 with left hip. Hip abductors: 4/5 but symmetric Gait unremarkable.         Impression and Recommendations:     This case required medical decision making of moderate complexity.

## 2014-05-12 NOTE — Patient Instructions (Signed)
Good to see you.  Continue exercises 3 times a week for another 6 weeks Start squats and lunges 1 time a week for 2 weeks, then 2 times a weke for 2 weeks.  Ice is still your friend See me again in 6 weeks.

## 2014-05-23 ENCOUNTER — Other Ambulatory Visit: Payer: Self-pay | Admitting: Internal Medicine

## 2014-05-23 NOTE — Telephone Encounter (Signed)
Last OV 8.12.15, last refill 10.28.15.  Please advise refill

## 2014-05-23 NOTE — Telephone Encounter (Signed)
Refilled colnazepam #6- with no refills.

## 2014-06-07 ENCOUNTER — Other Ambulatory Visit: Payer: Self-pay | Admitting: Internal Medicine

## 2014-06-20 ENCOUNTER — Other Ambulatory Visit: Payer: Self-pay | Admitting: Internal Medicine

## 2014-06-20 MED ORDER — CLONAZEPAM 1 MG PO TABS: 1.0000 mg | ORAL_TABLET | Freq: Two times a day (BID) | ORAL | Status: DC | PRN

## 2014-06-20 NOTE — Telephone Encounter (Signed)
Electronic Rx request for klonopin received. Medication last filled 05/23/14 and patient last seen in office 02/02/14. Please advise.

## 2014-06-20 NOTE — Telephone Encounter (Signed)
ok'd refill for clonazepam #60 with no refills.   

## 2014-06-22 ENCOUNTER — Encounter: Payer: Self-pay | Admitting: *Deleted

## 2014-07-04 ENCOUNTER — Ambulatory Visit: Payer: Commercial Managed Care - HMO | Admitting: Internal Medicine

## 2014-07-06 NOTE — Telephone Encounter (Signed)
Mailed unread message to pt  

## 2014-07-12 ENCOUNTER — Encounter: Payer: Self-pay | Admitting: Internal Medicine

## 2014-07-12 ENCOUNTER — Ambulatory Visit (INDEPENDENT_AMBULATORY_CARE_PROVIDER_SITE_OTHER): Payer: Commercial Managed Care - HMO | Admitting: Internal Medicine

## 2014-07-12 VITALS — BP 100/60 | HR 81 | Ht 62.5 in | Wt 135.5 lb

## 2014-07-12 DIAGNOSIS — L089 Local infection of the skin and subcutaneous tissue, unspecified: Secondary | ICD-10-CM

## 2014-07-12 MED ORDER — CEPHALEXIN 500 MG PO CAPS: 500.0000 mg | ORAL_CAPSULE | Freq: Three times a day (TID) | ORAL | Status: DC

## 2014-07-12 NOTE — Progress Notes (Signed)
Pre visit review using our clinic review tool, if applicable. No additional management support is needed unless otherwise documented below in the visit note. 

## 2014-07-12 NOTE — Patient Instructions (Signed)
Elevate hand.   Warm compresses as we discussed.    Take the antibiotics three times per day.    Take a probiotic daily while you are on the antibiotics and for two weeks after you complete the anitibiotic.

## 2014-07-12 NOTE — Progress Notes (Signed)
   Subjective:    Patient ID: Pam Salazar, female    DOB: 1945/12/14, 69 y.o.   MRN: [id number]  HPI 69 year old female with past history of depression, hypercholesterolemia and hypothyroidism who comes in today as a work in with concerns regarding swelling in her finger.  States started 2-3 days ago.  Reports pulling part of her nail off a few days ago.  Noticed increased redness and swelling. Worked yesterday.  More swelling and tenderness after working.  Some limited flexion secondary to the swelling.  No other injury or trauma.  No fever.     Past Medical History  Diagnosis Date  . Hypercholesterolemia   . Hypothyroidism   . Depression   . Panic disorder     previous agoraphobia  . Vitamin D deficiency     Current Outpatient Prescriptions on File Prior to Visit  Medication Sig Dispense Refill  . clonazePAM (KLONOPIN) 1 MG tablet Take 1 tablet (1 mg total) by mouth 2 (two) times daily as needed. for anxiety 60 tablet 0  . Ibuprofen-Famotidine 800-26.6 MG TABS Take 1 tablet by mouth 3 (three) times daily. 90 tablet 1  . levothyroxine (SYNTHROID, LEVOTHROID) 75 MCG tablet Take 1 tablet (75 mcg total) by mouth daily before breakfast. 90 tablet 3  . simvastatin (ZOCOR) 20 MG tablet Take 1 tablet (20 mg total) by mouth every evening. 90 tablet 1  . venlafaxine XR (EFFEXOR-XR) 150 MG 24 hr capsule TAKE 1 CAPSULE EVERY DAY 90 capsule 0   No current facility-administered medications on file prior to visit.    Review of Systems No fever.  Finger infection as outlined.  Increased swelling and tenderness.  Limited flexion.  Taking antiinflammatories.  No drainage.  No other injury or trauma.       Objective:   Physical Exam Filed Vitals:   07/12/14 0907  BP: 100/60  Pulse: 4   69 year old female in no acute distress.   RADIAL PULSE:  Equal bilaterally.      MSK:  Increased erythema right third finger.  Increased swelling and pain - third finger.  Erythema - extends - distal third  of finger.  No drainage.        Assessment & Plan:  1. Finger infection Exam as outlined.  Worsened over the last 24 hours.  Warm compresses.  Start keflex as directed.   Elevate hand.  Follow closely.  Cal with update over the next 24 hours.

## 2014-07-13 ENCOUNTER — Ambulatory Visit: Payer: Commercial Managed Care - HMO | Admitting: Nurse Practitioner

## 2014-07-13 ENCOUNTER — Telehealth: Payer: Self-pay | Admitting: Internal Medicine

## 2014-07-13 ENCOUNTER — Encounter: Payer: Self-pay | Admitting: Internal Medicine

## 2014-07-13 DIAGNOSIS — L0390 Cellulitis, unspecified: Secondary | ICD-10-CM

## 2014-07-13 DIAGNOSIS — L0291 Cutaneous abscess, unspecified: Secondary | ICD-10-CM

## 2014-07-13 DIAGNOSIS — L089 Local infection of the skin and subcutaneous tissue, unspecified: Secondary | ICD-10-CM

## 2014-07-13 NOTE — Telephone Encounter (Signed)
See note below

## 2014-07-13 NOTE — Telephone Encounter (Signed)
I don't want to give her ultram since she is on effexor.  I don't know how often she is taking the motrin.  Can add tylenol ES to this (2 q 8 hours).  Once it is drained, this should help with the pain.  The only other option for pain medication would be narcotic pain medication.  Would like to avoid if possible - with appt tomorrow.  If she feels she needs something like this, see if she has taken vicodin (hydrocodone) previously.  If so, did she tolerate?  If she needs this, would have to come get rx.

## 2014-07-13 NOTE — Telephone Encounter (Signed)
Since it appears it needs to be drained, Pam Salazar scheduled her an appt with Dr Jamal Collin at 11:15 tomorrow.  Let her know that I am not in the office this pm and that if she is concerned about waiting until tomorrow am - then eval today (probably at acute care or [facility] and see if they can drain).  Let me know if this is a problem.  Thanks

## 2014-07-13 NOTE — Telephone Encounter (Signed)
Where do you want me to send her?

## 2014-07-13 NOTE — Telephone Encounter (Signed)
Spoke to pt, aware of appt and verbalized understanding. States she is fine to wait until tomorrow for the appointment, but is asking for something for pain. "Ibuprofen 800 mg is not touching it"

## 2014-07-13 NOTE — Telephone Encounter (Signed)
Patient Name: Saleena Stavros DOB: 08/17/1945 Initial Comment Caller states she was seen yesterday for infected finger, prescribed keflex, has developed pus pocket, has gone from yellowish to blackening, xfer from office for triage Nurse Assessment Guidelines Guideline Title Affirmed Question Affirmed Notes PCP Call - No Triage [1] Follow-up call from patient regarding patient's clinical status AND [2] information urgent FOLLOW UP CALL REGARDING HER FINGER AFTER BEING ON ANTIBIOTICS Final Disposition User Call PCP Now [place], RN, Amy Comments SAW DR. Noonday. SHE WAS PUT ON KEFLEX. SHE HAD 4 DOSES OF THE MEDICATION. MIDDLE FINGER RIGHT HAND. SHE STATES ITS RED, THERE IS A PUS POCKET ON THE SIDE OF THE FINGERNAIL CLOSEST TO THE INDEX FINGER. SHE STATES IT HAS YELLOW AND GREEN - SHE STATES IT ALSO HAS BLACK IN IT AS WELL. SHE STATES IT WAS PAINFUL. SHE STATES ITS SWOLLEN TWICE THE SIZE OF HER NORMAL SIZE. NO FEVER. 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 SHE IS WANTING TO SEE IF SHE CAN GET IT DRAINED. SHE STATES SHE WAS SUPPOSE TO CALL BACK AND GIVE AN UPDATE TO THE MD. SHE HAD HER FRIEND TO LOOK AT THE FINGER AND SEE IF THE COLOR ON IT WA BLACK OR GREY AND THE FRIEND STATES THAT IT LOOKS GREY IN COLOR - NOT BLACK. CALLED OFFICE TO MAKE SURE THAT THE MD WANTED TO DO THE DRAINING IN THE OFFICE OF WHAT SHE WANTED TO HAVE THE PATIENT DO. WAS INSTRUCTED TO SEND OVER THE NOTE AND TO INFORM PATIENT THAT MD WILL CONTACT HER DIRECTLY. CALLED THE PATIENT AND INFORMED HER OF THIS INFORMATION. WILL CLOSE THIS CALL.

## 2014-07-13 NOTE — Telephone Encounter (Signed)
Left message for pt to return my call.

## 2014-07-14 ENCOUNTER — Encounter: Payer: Self-pay | Admitting: Internal Medicine

## 2014-07-14 ENCOUNTER — Ambulatory Visit (INDEPENDENT_AMBULATORY_CARE_PROVIDER_SITE_OTHER): Payer: Commercial Managed Care - HMO | Admitting: General Surgery

## 2014-07-14 ENCOUNTER — Encounter: Payer: Self-pay | Admitting: General Surgery

## 2014-07-14 VITALS — BP 122/74 | HR 74 | Resp 12 | Ht 62.5 in | Wt 139.0 lb

## 2014-07-14 DIAGNOSIS — L089 Local infection of the skin and subcutaneous tissue, unspecified: Secondary | ICD-10-CM | POA: Insufficient documentation

## 2014-07-14 DIAGNOSIS — L03011 Cellulitis of right finger: Secondary | ICD-10-CM

## 2014-07-14 NOTE — Progress Notes (Signed)
Patient ID: Pam Salazar, female   DOB: 11-26-1945, 69 y.o.   MRN: [id number]  Chief Complaint  Patient presents with  . Other    infected finger    HPI Pam Salazar is a 69 y.o. female.  Here today for evaluation of infected right middle finger. She states it started with a hang nail 07-08-14. It got worse by Monday and it is more swollen and red. . She is currently on Keflex.  HPI  Past Medical History  Diagnosis Date  . Hypercholesterolemia   . Hypothyroidism   . Depression   . Panic disorder     previous agoraphobia  . Vitamin D deficiency     Past Surgical History  Procedure Laterality Date  . Abdominal hysterectomy  1981    partial - secondary to bleeding, ovaries not removed -   . Orif fibula fracture Right 2003    right lower extremitiy  . Tonsillectomy  1974    Family History  Problem Relation Age of Onset  . Arthritis Mother   . Hyperlipidemia Mother   . Arthritis Father   . Prostate cancer Father   . Breast cancer Sister   . Heart disease Brother     heart attack  . Breast cancer Maternal Aunt   . Colon cancer Maternal Uncle     Social History History  Substance Use Topics  . Smoking status: Former Smoker    Types: Cigarettes  . Smokeless tobacco: Former Systems developer    Quit date: 07/26/2013     Comment: tryin to quit-using gum as a replacement  . Alcohol Use: No    No Known Allergies  Current Outpatient Prescriptions  Medication Sig Dispense Refill  . cephALEXin (KEFLEX) 500 MG capsule     . clonazePAM (KLONOPIN) 1 MG tablet Take 1 tablet (1 mg total) by mouth 2 (two) times daily as needed. for anxiety 60 tablet 0  . Ibuprofen-Famotidine 800-26.6 MG TABS Take 1 tablet by mouth 3 (three) times daily. 90 tablet 1  . levothyroxine (SYNTHROID, LEVOTHROID) 75 MCG tablet Take 1 tablet (75 mcg total) by mouth daily before breakfast. 90 tablet 3  . simvastatin (ZOCOR) 20 MG tablet Take 1 tablet (20 mg total) by mouth every evening. 90 tablet 1  . venlafaxine  XR (EFFEXOR-XR) 150 MG 24 hr capsule TAKE 1 CAPSULE EVERY DAY 90 capsule 0   No current facility-administered medications for this visit.    Review of Systems Review of Systems  Constitutional: Negative.   Respiratory: Negative.   Cardiovascular: Negative.     Blood pressure 122/74, pulse 74, resp. rate 12, height 5' 2.5" (1.588 m), weight 139 lb (63.05 kg).  Physical Exam Physical Exam  Constitutional: She is oriented to person, place, and time. She appears well-developed and well-nourished.  Neurological: She is alert and oriented to person, place, and time.  Skin: Skin is warm and dry.  Right third finger nail bed area with redness and a pustule like 74mm area on one end. Tender to palpation  Data Reviewed    Assessment    Infected nail bed right third finger     Plan    Drainage performed with consent.  Procedure: Digital blocke with 54ml 1% xylocaine mixed with 0.5% marcaine. Nailbed area prepped with chloro prep. Pustule was unroofed and  2 drops of thick pus drained. Nail lifted up to allow full drainage. Dressed with adaptic and tube gauze. Procedure well tolerated.  To complete course of Keflex. Follow up as  needed.       SANKAR,SEEPLAPUTHUR G 07/15/2014, 4:05 PM

## 2014-07-14 NOTE — Patient Instructions (Addendum)
Keep area clean, may soak in warm water. Use a bandaid daily till area is dry and healed.

## 2014-07-15 ENCOUNTER — Encounter: Payer: Self-pay | Admitting: General Surgery

## 2014-07-22 ENCOUNTER — Other Ambulatory Visit: Payer: Self-pay | Admitting: Internal Medicine

## 2014-07-22 NOTE — Telephone Encounter (Signed)
rx written for clonazepam #60 with no refills.

## 2014-07-22 NOTE — Telephone Encounter (Signed)
Last refill 12.28.15.  Please advise refill

## 2014-07-22 NOTE — Telephone Encounter (Signed)
Faxed to pharmacy

## 2014-08-03 ENCOUNTER — Ambulatory Visit (INDEPENDENT_AMBULATORY_CARE_PROVIDER_SITE_OTHER): Payer: Self-pay | Admitting: Nurse Practitioner

## 2014-08-03 ENCOUNTER — Encounter: Payer: Self-pay | Admitting: Nurse Practitioner

## 2014-08-03 VITALS — BP 102/72 | HR 83 | Temp 97.3°F | Resp 12 | Ht 62.5 in | Wt 137.0 lb

## 2014-08-03 DIAGNOSIS — Z041 Encounter for examination and observation following transport accident: Secondary | ICD-10-CM

## 2014-08-03 DIAGNOSIS — M545 Low back pain, unspecified: Secondary | ICD-10-CM

## 2014-08-03 DIAGNOSIS — Z043 Encounter for examination and observation following other accident: Secondary | ICD-10-CM

## 2014-08-03 MED ORDER — TIZANIDINE HCL 2 MG PO TABS
2.0000 mg | ORAL_TABLET | Freq: Every day | ORAL | Status: DC
Start: 2014-08-03 — End: 2014-09-26

## 2014-08-03 NOTE — Progress Notes (Signed)
Pre visit review using our clinic review tool, if applicable. No additional management support is needed unless otherwise documented below in the visit note. 
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
## 2014-08-03 NOTE — Patient Instructions (Signed)
The famotidine with the ibuprofen may increase the effects of tizanidine the muscle relaxer.   Please call next week if we need to do the prednisone.

## 2014-08-04 DIAGNOSIS — Z041 Encounter for examination and observation following transport accident: Secondary | ICD-10-CM | POA: Insufficient documentation

## 2014-08-04 NOTE — Assessment & Plan Note (Signed)
Right lower lumbar pain. Worsening. Pt has seen Dr. Tamala Julian in the past. If the muscle relaxer is not helpful, we can try prednisone or refer back to Dr. Tamala Julian. Rx for tizanidine 2 mg at bed time. FU next week by phone.
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
## 2014-08-09 ENCOUNTER — Ambulatory Visit: Payer: Commercial Managed Care - HMO | Admitting: Internal Medicine

## 2014-08-22 ENCOUNTER — Other Ambulatory Visit: Payer: Self-pay | Admitting: Internal Medicine

## 2014-08-22 NOTE — Telephone Encounter (Signed)
I refilled her clonazepam #60 with one refills.  She needs a physical scheduled first available.  Please schedule and notify pt.

## 2014-08-22 NOTE — Telephone Encounter (Signed)
Last refill 1.29.15, last OV 2.10.16.  Please advise refill.

## 2014-08-23 NOTE — Telephone Encounter (Signed)
rx faxed

## 2014-08-24 ENCOUNTER — Other Ambulatory Visit: Payer: Self-pay | Admitting: Internal Medicine

## 2014-08-26 NOTE — Telephone Encounter (Signed)
Lab appt sch 08/29/14. Offered to send to local pharmacy until labs resulted, pt declines, wants sent to mail order

## 2014-08-29 ENCOUNTER — Other Ambulatory Visit (INDEPENDENT_AMBULATORY_CARE_PROVIDER_SITE_OTHER): Payer: Commercial Managed Care - HMO

## 2014-08-29 ENCOUNTER — Telehealth: Payer: Self-pay | Admitting: *Deleted

## 2014-08-29 DIAGNOSIS — E039 Hypothyroidism, unspecified: Secondary | ICD-10-CM

## 2014-08-29 DIAGNOSIS — E7800 Pure hypercholesterolemia, unspecified: Secondary | ICD-10-CM

## 2014-08-29 DIAGNOSIS — E559 Vitamin D deficiency, unspecified: Secondary | ICD-10-CM

## 2014-08-29 LAB — CBC WITH DIFFERENTIAL/PLATELET
BASOS ABS: 0 10*3/uL (ref 0.0–0.1)
Basophils Relative: 0.5 % (ref 0.0–3.0)
EOS ABS: 0 10*3/uL (ref 0.0–0.7)
Eosinophils Relative: 0.1 % (ref 0.0–5.0)
HCT: 43.2 % (ref 36.0–46.0)
HEMOGLOBIN: 14.5 g/dL (ref 12.0–15.0)
Lymphocytes Relative: 35.6 % (ref 12.0–46.0)
Lymphs Abs: 2.1 10*3/uL (ref 0.7–4.0)
MCHC: 33.5 g/dL (ref 30.0–36.0)
MCV: 89.3 fl (ref 78.0–100.0)
MONO ABS: 0.6 10*3/uL (ref 0.1–1.0)
Monocytes Relative: 10.7 % (ref 3.0–12.0)
NEUTROS PCT: 53.1 % (ref 43.0–77.0)
Neutro Abs: 3.1 10*3/uL (ref 1.4–7.7)
PLATELETS: 195 10*3/uL (ref 150.0–400.0)
RBC: 4.84 Mil/uL (ref 3.87–5.11)
RDW: 13.2 % (ref 11.5–15.5)
WBC: 5.8 10*3/uL (ref 4.0–10.5)

## 2014-08-29 LAB — COMPREHENSIVE METABOLIC PANEL
ALT: 28 U/L (ref 0–35)
AST: 33 U/L (ref 0–37)
Albumin: 4.5 g/dL (ref 3.5–5.2)
Alkaline Phosphatase: 49 U/L (ref 39–117)
BILIRUBIN TOTAL: 0.5 mg/dL (ref 0.2–1.2)
BUN: 22 mg/dL (ref 6–23)
CALCIUM: 9.6 mg/dL (ref 8.4–10.5)
CO2: 27 mEq/L (ref 19–32)
Chloride: 107 mEq/L (ref 96–112)
Creatinine, Ser: 0.81 mg/dL (ref 0.40–1.20)
GFR: 74.68 mL/min (ref >60.00)
GLUCOSE: 85 mg/dL (ref 70–99)
Potassium: 4 mEq/L (ref 3.5–5.1)
Sodium: 140 mEq/L (ref 135–145)
Total Protein: 7.2 g/dL (ref 6.0–8.3)

## 2014-08-29 LAB — LIPID PANEL
CHOL/HDL RATIO: 2
Cholesterol: 148 mg/dL (ref 0–200)
HDL: 65.9 mg/dL (ref >39.00)
LDL CALC: 61 mg/dL (ref 0–99)
NonHDL: 82.1
TRIGLYCERIDES: 105 mg/dL (ref 0.0–149.0)
VLDL: 21 mg/dL (ref 0.0–40.0)

## 2014-08-29 LAB — VITAMIN D 25 HYDROXY (VIT D DEFICIENCY, FRACTURES): VITD: 57.42 ng/mL (ref 30.00–100.00)

## 2014-08-29 LAB — TSH: TSH: 4.02 u[IU]/mL (ref 0.35–4.50)

## 2014-08-29 NOTE — Telephone Encounter (Signed)
Orders placed for labs

## 2014-08-29 NOTE — Telephone Encounter (Signed)
Labs and dx?  

## 2014-08-30 ENCOUNTER — Encounter: Payer: Self-pay | Admitting: Internal Medicine

## 2014-09-02 NOTE — Telephone Encounter (Signed)
Unread mychart message mailed to patient 

## 2014-09-14 ENCOUNTER — Encounter: Payer: Self-pay | Admitting: Internal Medicine

## 2014-09-14 DIAGNOSIS — Z0000 Encounter for general adult medical examination without abnormal findings: Secondary | ICD-10-CM

## 2014-09-15 NOTE — Telephone Encounter (Signed)
Order placed for dermatology referral.  Pt notified via my chart.

## 2014-09-26 ENCOUNTER — Telehealth: Payer: Self-pay | Admitting: Internal Medicine

## 2014-09-26 ENCOUNTER — Encounter: Payer: Self-pay | Admitting: Internal Medicine

## 2014-09-26 ENCOUNTER — Ambulatory Visit: Payer: Commercial Managed Care - HMO | Admitting: Nurse Practitioner

## 2014-09-26 ENCOUNTER — Ambulatory Visit (INDEPENDENT_AMBULATORY_CARE_PROVIDER_SITE_OTHER): Payer: Commercial Managed Care - HMO | Admitting: Internal Medicine

## 2014-09-26 VITALS — BP 92/62 | HR 80 | Temp 97.7°F | Ht 62.5 in | Wt 135.2 lb

## 2014-09-26 DIAGNOSIS — F32A Depression, unspecified: Secondary | ICD-10-CM

## 2014-09-26 DIAGNOSIS — G4700 Insomnia, unspecified: Secondary | ICD-10-CM | POA: Diagnosis not present

## 2014-09-26 DIAGNOSIS — F329 Major depressive disorder, single episode, unspecified: Secondary | ICD-10-CM | POA: Diagnosis not present

## 2014-09-26 MED ORDER — CLONAZEPAM 1 MG PO TABS: 1.0000 mg | ORAL_TABLET | Freq: Three times a day (TID) | ORAL | Status: DC | PRN

## 2014-09-26 MED ORDER — VENLAFAXINE HCL ER 75 MG PO CP24: 225.0000 mg | ORAL_CAPSULE | Freq: Every day | ORAL | Status: DC

## 2014-09-26 NOTE — Assessment & Plan Note (Signed)
Recent insomnia likely related to worsening anxiety and depression. Will increase Venlafaxine and Clonazepam. Follow up in 2 weeks and sooner as needed.

## 2014-09-26 NOTE — Telephone Encounter (Signed)
Sent message regarding psych referral.

## 2014-09-26 NOTE — Patient Instructions (Signed)
Increase Venlafaxine to 225mg  daily.  Increase Clonazepam to 1mg  up to three times daily as needed for anxiety.  We will set up an evaluation with with Dr. Rexene Edison or [place] [facility].  Follow up in 2 weeks or sooner as needed.

## 2014-09-26 NOTE — Assessment & Plan Note (Signed)
Major depression with recurrent episode. Will set up counseling. Will increase Venlafaxine to 225mg  daily. Continue Clonazepam 1mg  po tid prn. She will contract for safety. Follow up in 2 weeks and sooner as needed.

## 2014-09-26 NOTE — Progress Notes (Signed)
Pre visit review using our clinic review tool, if applicable. No additional management support is needed unless otherwise documented below in the visit note. 

## 2014-09-26 NOTE — Progress Notes (Signed)
Subjective:    Patient ID: Pam Salazar, female    DOB: 1946/02/07, 69 y.o.   MRN: [id number]  HPI  69YO female presents for acute visit.  Insomnia - Starting one week ago. Was only able to sleep 3 hours when out of town. Came home and continued to have trouble sleeping. Noted increased anxiety, decreased concentration.  Feels that depression has also been getting worse. Originally diagnosed in the 1990s. Hospitalized at one point. Intolerant to several other SSRIs including Cymbalta and Prozac.  At present, notes decreased motivation, "hating life."  Felt she would be better off dead, but denies plan of suicide.  Tried using Magnesium to help with sleep, with no benefit. Also tried melatonin, but developed nausea with this. Taking Clonazepam about 2-3 times daily with some improvement in anxiety, panic. Taking her husband's Gabapentin 100mg  at bedtime to help with sleep.  Would prefer to use "alternative" medication. Considering accupucture.  Past medical, surgical, family and social history per today's encounter.  Review of Systems  Constitutional: Negative for fever, chills, appetite change, fatigue and unexpected weight change.  Eyes: Negative for visual disturbance.  Respiratory: Negative for shortness of breath.   Cardiovascular: Negative for chest pain and leg swelling.  Gastrointestinal: Negative for abdominal pain.  Skin: Negative for color change and rash.  Hematological: Negative for adenopathy. Does not bruise/bleed easily.  Psychiatric/Behavioral: Positive for suicidal ideas, sleep disturbance and dysphoric mood. The patient is nervous/anxious.        Objective:    BP 92/62 mmHg  Pulse 80  Temp(Src) 97.7 F (36.5 C) (Oral)  Ht 5' 2.5" (1.588 m)  Wt 135 lb 4 oz (61.349 kg)  BMI 24.33 kg/m2  SpO2 98% Physical Exam  Constitutional: She is oriented to person, place, and time. She appears well-developed and well-nourished. No distress.  HENT:  Head:  Normocephalic and atraumatic.  Right Ear: External ear normal.  Left Ear: External ear normal.  Nose: Nose normal.  Mouth/Throat: Oropharynx is clear and moist. No oropharyngeal exudate.  Eyes: Conjunctivae are normal. Pupils are equal, round, and reactive to light. Right eye exhibits no discharge. Left eye exhibits no discharge. No scleral icterus.  Neck: Normal range of motion. Neck supple. No tracheal deviation present. No thyromegaly present.  Cardiovascular: Normal rate, regular rhythm, normal heart sounds and intact distal pulses.  Exam reveals no gallop and no friction rub.   No murmur heard. Pulmonary/Chest: Effort normal and breath sounds normal. No respiratory distress. She has no wheezes. She has no rales. She exhibits no tenderness.  Musculoskeletal: Normal range of motion. She exhibits no edema or tenderness.  Lymphadenopathy:    She has no cervical adenopathy.  Neurological: She is alert and oriented to person, place, and time. No cranial nerve deficit. She exhibits normal muscle tone. Coordination normal.  Skin: Skin is warm and dry. No rash noted. She is not diaphoretic. No erythema. No pallor.  Psychiatric: Her speech is normal and behavior is normal. Judgment and thought content normal. Her mood appears anxious. Cognition and memory are normal. She exhibits a depressed mood. She expresses no suicidal ideation. She expresses no suicidal plans.          Assessment & Plan:  Over 71min of which >50% spent in face-to-face contact with patient discussing plan of care  Problem List Items Addressed This Visit      Unprioritized   Depression - Primary    Major depression with recurrent episode. Will set up counseling. Will increase  Venlafaxine to 225mg  daily. Continue Clonazepam 1mg  po tid prn. She will contract for safety. Follow up in 2 weeks and sooner as needed.      Relevant Medications   venlafaxine (EFFEXOR-XR) 24 hr capsule   Other Relevant Orders   Ambulatory  referral to Psychology   Insomnia    Recent insomnia likely related to worsening anxiety and depression. Will increase Venlafaxine and Clonazepam. Follow up in 2 weeks and sooner as needed.          Return in about 2 weeks (around 10/10/2014).

## 2014-09-28 NOTE — Telephone Encounter (Signed)
Unread mychart message mailed to patient 

## 2014-09-30 ENCOUNTER — Encounter: Payer: Self-pay | Admitting: Internal Medicine

## 2014-09-30 DIAGNOSIS — F329 Major depressive disorder, single episode, unspecified: Secondary | ICD-10-CM

## 2014-09-30 DIAGNOSIS — F32A Depression, unspecified: Secondary | ICD-10-CM

## 2014-10-03 NOTE — Telephone Encounter (Signed)
Order placed for referral.  

## 2014-10-03 NOTE — Telephone Encounter (Signed)
Order placed for referral to psych.

## 2014-10-05 ENCOUNTER — Encounter: Payer: Self-pay | Admitting: Internal Medicine

## 2014-10-05 ENCOUNTER — Ambulatory Visit: Payer: Commercial Managed Care - HMO | Admitting: Licensed Clinical Social Worker

## 2014-10-06 ENCOUNTER — Ambulatory Visit (INDEPENDENT_AMBULATORY_CARE_PROVIDER_SITE_OTHER): Payer: Medicare HMO | Admitting: Psychology

## 2014-10-06 DIAGNOSIS — F332 Major depressive disorder, recurrent severe without psychotic features: Secondary | ICD-10-CM | POA: Diagnosis not present

## 2014-10-12 ENCOUNTER — Encounter: Payer: Self-pay | Admitting: Internal Medicine

## 2014-10-12 ENCOUNTER — Ambulatory Visit: Payer: Commercial Managed Care - HMO | Admitting: Internal Medicine

## 2014-10-19 ENCOUNTER — Encounter: Payer: Self-pay | Admitting: Internal Medicine

## 2014-10-19 ENCOUNTER — Ambulatory Visit (INDEPENDENT_AMBULATORY_CARE_PROVIDER_SITE_OTHER): Payer: Commercial Managed Care - HMO | Admitting: Internal Medicine

## 2014-10-19 VITALS — BP 102/64 | HR 86 | Temp 98.0°F | Ht 62.5 in | Wt 136.5 lb

## 2014-10-19 DIAGNOSIS — F32A Depression, unspecified: Secondary | ICD-10-CM

## 2014-10-19 DIAGNOSIS — F329 Major depressive disorder, single episode, unspecified: Secondary | ICD-10-CM

## 2014-10-19 DIAGNOSIS — I959 Hypotension, unspecified: Secondary | ICD-10-CM | POA: Diagnosis not present

## 2014-10-19 NOTE — Progress Notes (Signed)
Pre visit review using our clinic review tool, if applicable. No additional management support is needed unless otherwise documented below in the visit note. 

## 2014-10-25 ENCOUNTER — Encounter: Payer: Self-pay | Admitting: Internal Medicine

## 2014-10-25 DIAGNOSIS — I959 Hypotension, unspecified: Secondary | ICD-10-CM

## 2014-10-25 DIAGNOSIS — Z1239 Encounter for other screening for malignant neoplasm of breast: Secondary | ICD-10-CM

## 2014-10-25 HISTORY — DX: Hypotension, unspecified: I95.9

## 2014-10-25 NOTE — Telephone Encounter (Signed)
Order placed for mammogram.  Pt notified via my chart.

## 2014-10-25 NOTE — Progress Notes (Signed)
Patient ID: Pam Salazar, female   DOB: 11-May-1946, 69 y.o.   MRN: [id number]   Subjective:    Patient ID: Pam Salazar, female    DOB: 16-Oct-1945, 69 y.o.   MRN: [id number]  HPI  Patient here for a scheduled follow up.  Has been having problems with increased depression.  See my chart messages and phone notes.  Has a history of depression.  Has worsened recently.  Tried chinese accupuncture two weeks ago.  Took jujube, barley and chinese licorice.  Had abdominal cramping after taking.  Nausea.  Had vasovagal episode.  EMS called.  Everything checked out fine. Stopped the herbal medications and the GI symptoms subsided.  No further syncopal or near syncopal episodes.  No chest pain or tightness.  No sob.  Eating.  Bowels stable now.  Reports had been on prozac for a while in the 90s.  Did well on this for a while.  zoloft worked well for her for a long time.  celexa and lexapro - did not help. cymbalta - did not tolerate.  Discussed at length with her today.  Denies any suicidal ideations.  Agreeable to see psychiatry.     Past Medical History  Diagnosis Date  . Hypercholesterolemia   . Hypothyroidism   . Depression   . Panic disorder     previous agoraphobia  . Vitamin D deficiency     Review of Systems  Constitutional: Positive for fatigue. Negative for unexpected weight change.  HENT: Negative for congestion and sinus pressure.   Respiratory: Negative for cough, chest tightness and shortness of breath.   Cardiovascular: Negative for chest pain, palpitations and leg swelling.  Gastrointestinal: Negative for nausea, vomiting, abdominal pain and diarrhea.  Neurological: Negative for dizziness, light-headedness and headaches.  Psychiatric/Behavioral: Negative for suicidal ideas.       Increased stress and depression as outlined.         Objective:     Blood pressure recheck:  102/64 - standing.    Physical Exam  HENT:  Nose: Nose normal.  Mouth/Throat: Oropharynx is clear and  moist.  Neck: Neck supple. No thyromegaly present.  Cardiovascular: Normal rate and regular rhythm.   Pulmonary/Chest: Breath sounds normal. No respiratory distress. She has no wheezes.  Abdominal: Soft. Bowel sounds are normal. There is no tenderness.  Musculoskeletal: She exhibits no edema or tenderness.  Lymphadenopathy:    She has no cervical adenopathy.    BP 102/64 mmHg  Pulse 86  Temp(Src) 98 F (36.7 C) (Oral)  Ht 5' 2.5" (1.588 m)  Wt 136 lb 8 oz (61.916 kg)  BMI 24.55 kg/m2  SpO2 97% Wt Readings from Last 3 Encounters:  10/19/14 136 lb 8 oz (61.916 kg)  09/26/14 135 lb 4 oz (61.349 kg)  08/03/14 137 lb (62.143 kg)     Lab Results  Component Value Date   WBC 5.8 08/29/2014   HGB 14.5 08/29/2014   HCT 43.2 08/29/2014   PLT 195.0 08/29/2014   GLUCOSE 85 08/29/2014   CHOL 148 08/29/2014   TRIG 105.0 08/29/2014   HDL 65.90 08/29/2014   LDLCALC 61 08/29/2014   ALT 28 08/29/2014   AST 33 08/29/2014   NA 140 08/29/2014   K 4.0 08/29/2014   CL 107 08/29/2014   CREATININE 0.81 08/29/2014   BUN 22 08/29/2014   CO2 27 08/29/2014   TSH 4.02 08/29/2014        Assessment & Plan:   Problem List Items Addressed This Visit  Depression - Primary    Increased depression.  Recently worsened as outlined.  On effexor.  Has been on multiple medications previously as outlined.  Discussed at length with her today.  Psychiatry referral in place.  No suicidal ideations.  Hold on making changes in her medications.  Psych as soon as possible.  Pt comfortable with plan.        Relevant Medications   venlafaxine XR (EFFEXOR-XR) 150 MG 24 hr capsule   Low blood pressure    Recheck as outlined.  Follow.  No dizziness or light headedness.  Follow.  Stay hydrated.          I spent 25 minutes with the patient and more than 50% of the time was spent in consultation regarding the above.     Einar Pheasant, MD

## 2014-10-25 NOTE — Assessment & Plan Note (Signed)
Recheck as outlined.  Follow.  No dizziness or light headedness.  Follow.  Stay hydrated.

## 2014-10-25 NOTE — Assessment & Plan Note (Signed)
Increased depression.  Recently worsened as outlined.  On effexor.  Has been on multiple medications previously as outlined.  Discussed at length with her today.  Psychiatry referral in place.  No suicidal ideations.  Hold on making changes in her medications.  Psych as soon as possible.  Pt comfortable with plan.

## 2014-10-30 ENCOUNTER — Encounter: Payer: Self-pay | Admitting: Internal Medicine

## 2014-10-31 ENCOUNTER — Encounter: Payer: Self-pay | Admitting: Internal Medicine

## 2014-10-31 NOTE — Telephone Encounter (Signed)
Called and spoke to pt.  See other message.  Has an appt with Dr Braulio Conte - 12/05/14.  She spoke to her on the phone.  Comfortable with her and comfortable with waiting.  Will call if problems.

## 2014-10-31 NOTE — Telephone Encounter (Signed)
The patient stopped by the office today to see where we were with her Psychiatric referral. I did inform the patient that we tried to get her into [facility] with several calls , but no success. She showed me several places in the area that would except her insurance that were forwarded to her by her insurance company. She also stated that she would go to the walk in at [facility] . I informed the patient that I had just spoken with someone at that clinic and they would except her insurance . She stated that the person she spoke with there stated that they did not except her insurance. I wanted to make sure that we were talking about the same place so I asked was the location on [address] and she informed me that [address] was the location. I asked if she wouldn't mind going to that location ,because I had spoken with someone there and gave them her insurance information and they do except her [place] coverage. She informed me that she did not mind going there and she would wait all day just to be seen. I informed the patient to let me know how her visit went and if she needed any more assistance from our office.

## 2014-10-31 NOTE — Telephone Encounter (Signed)
Called pt and spoke to her.  She has an appt with Dr Braulio Conte in [facility] 12/05/14.  She spoke to Dr Braulio Conte on the phone.  She is comfortable with her and comfortable with waiting.  Feeling better now that she knows she has an appt.  Instructed to call if needs anything.

## 2014-11-10 ENCOUNTER — Other Ambulatory Visit (INDEPENDENT_AMBULATORY_CARE_PROVIDER_SITE_OTHER): Payer: Commercial Managed Care - HMO

## 2014-11-10 ENCOUNTER — Encounter: Payer: Self-pay | Admitting: Family Medicine

## 2014-11-10 ENCOUNTER — Ambulatory Visit (INDEPENDENT_AMBULATORY_CARE_PROVIDER_SITE_OTHER): Payer: Commercial Managed Care - HMO | Admitting: Family Medicine

## 2014-11-10 VITALS — BP 108/78 | HR 88 | Ht 62.5 in | Wt 139.0 lb

## 2014-11-10 DIAGNOSIS — M7550 Bursitis of unspecified shoulder: Secondary | ICD-10-CM | POA: Insufficient documentation

## 2014-11-10 DIAGNOSIS — M7552 Bursitis of left shoulder: Secondary | ICD-10-CM | POA: Diagnosis not present

## 2014-11-10 DIAGNOSIS — M25512 Pain in left shoulder: Secondary | ICD-10-CM | POA: Diagnosis not present

## 2014-11-10 NOTE — Assessment & Plan Note (Signed)
Patient given an injection today. Patient will continue with the over-the-counter natural anti-inflammatories as well as oral anti-inflammatories and was prescribed previously. Patient does have muscle relaxer as needed. Patient did respond very well to the injection didn't this is likely an intra-articular pathology that patient has any worsening symptoms we may need to rule out cervical radiculopathy. Patient given home exercises and will come back and see me again in 3 weeks for further evaluation and treatment.

## 2014-11-10 NOTE — Progress Notes (Signed)
[name] [address], [place] [address] Phone: [phone] Subjective:    I'm seeing this patient by the request  of:  Pam Pheasant, MD   CC: left shoulder pain.  [id number] Pam Salazar is a 69 y.o. female coming in with complaint of shoulder pain. Patient states that his orbit dull throbbing aching sensation. Patient states it is nothing that stopping her from her activities but she is noticing that it is affecting more of her daily activities. Patient states that she can also have pain at night when she lays on that side. Denies any weakness. States that though sometimes it feels that there is a sharp pain that radiates down her arm. Rates the severity of pain is 8 out of 10.     Past medical history, social, surgical and family history all reviewed in electronic medical record.   Review of Systems: No headache, visual changes, nausea, vomiting, diarrhea, constipation, dizziness, abdominal pain, skin rash, fevers, chills, night sweats, weight loss, swollen lymph nodes, body aches, joint swelling, muscle aches, chest pain, shortness of breath, mood changes.   Objective Blood pressure 108/78, pulse 88, height 5' 2.5" (1.588 m), weight 139 lb (63.05 kg), SpO2 98 %.  General: No apparent distress alert and oriented x3 mood and affect normal, dressed appropriately.  HEENT: Pupils equal, extraocular movements intact  Respiratory: Patient's speak in full sentences and does not appear short of breath  Cardiovascular: No lower extremity edema, non tender, no erythema  Skin: Warm dry intact with no signs of infection or rash on extremities or on axial skeleton.  Abdomen: Soft nontender  Neuro: Cranial nerves II through XII are intact, neurovascularly intact in all extremities with 2+ DTRs and 2+ pulses.  Lymph: No lymphadenopathy of posterior or anterior cervical chain or axillae bilaterally.  Gait normal with good balance and coordination.    MSK:  Non tender with full range of motion and good stability and symmetric strength and tone of  elbows, wrist, hip, knee and ankles bilaterally.  Shoulder: left Inspection reveals no abnormalities, atrophy or asymmetry. Palpation is normal with no tenderness over AC joint or bicipital groove. ROM is full in all planes passively. Rotator cuff strength normal throughout. signs of impingement with positive Neer and Hawkin's tests, but negative empty can sign. Speeds and Yergason's tests normal. No labral pathology noted with negative Obrien's, negative clunk and good stability. Normal scapular function observed. No painful arc and no drop arm sign. No apprehension sign  MSK US performed of: left This study was ordered, performed, and interpreted by Charlann Boxer D.O.  Shoulder:   Supraspinatus:  Appears normal on long and transverse views, Bursal bulge seen with shoulder abduction on impingement view. Infraspinatus:  Appears normal on long and transverse views. Significant increase in Doppler flow Subscapularis:  Appears normal on long and transverse views. Positive bursa Teres Minor:  Appears normal on long and transverse views. AC joint:  Capsule undistended, no geyser sign. Glenohumeral Joint:  Appears normal without effusion. Glenoid Labrum:  Intact without visualized tears. Biceps Tendon:  Appears normal on long and transverse views, no fraying of tendon, tendon located in intertubercular groove, no subluxation with shoulder internal or external rotation.  Impression: Subacromial bursitis  Procedure: Real-time Ultrasound Guided Injection of left glenohumeral joint Device: GE Logiq E  Ultrasound guided injection is preferred based studies that show increased duration, increased effect, greater accuracy, decreased procedural pain, increased response rate with ultrasound guided versus blind  injection.  Verbal informed consent obtained.  Time-out conducted.  Noted no overlying  erythema, induration, or other signs of local infection.  Skin prepped in a sterile fashion.  Local anesthesia: Topical Ethyl chloride.  With sterile technique and under real time ultrasound guidance:  Joint visualized.  23g 1  inch needle inserted posterior approach. Pictures taken for needle placement. Patient did have injection of 2 cc of 1% lidocaine, 2 cc of 0.5% Marcaine, and 1.0 cc of Kenalog 40 mg/dL. Completed without difficulty  Pain immediately resolved suggesting accurate placement of the medication.  Advised to call if fevers/chills, erythema, induration, drainage, or persistent bleeding.  Images permanently stored and available for review in the ultrasound unit.  Impression: Technically successful ultrasound guided injection.  Procedure note 55974; 15 minutes spent for Therapeutic exercises as stated in above notes.  This included exercises focusing on stretching, strengthening, with significant focus on eccentric aspects.   Shoulder Exercises that included:  Basic scapular stabilization to include adduction and depression of scapula Scaption, focusing on proper movement and good control Internal and External rotation utilizing a theraband, with elbow tucked at side entire time Rows with therabandProper technique shown and discussed handout in great detail with ATC.  All questions were discussed and answered.      Impression and Recommendations:     This case required medical decision making of moderate complexity.

## 2014-11-10 NOTE — Patient Instructions (Signed)
Good to see you Sorry for the wait.  Ice 20 minutes 2 times daily. Usually after activity and before bed. Exercises 3 times a week.  Turmeric 500mg  twice daily Vitamin D 2000 Iu daily See me again in 3-4 weeks to make sure you are better

## 2014-11-10 NOTE — Progress Notes (Signed)
Pre visit review using our clinic review tool, if applicable. No additional management support is needed unless otherwise documented below in the visit note. 

## 2014-11-14 ENCOUNTER — Telehealth: Payer: Self-pay | Admitting: Internal Medicine

## 2014-11-15 ENCOUNTER — Other Ambulatory Visit: Payer: Self-pay | Admitting: *Deleted

## 2014-11-15 MED ORDER — IBUPROFEN-FAMOTIDINE 800-26.6 MG PO TABS: 1.0000 | ORAL_TABLET | Freq: Three times a day (TID) | ORAL | Status: DC

## 2014-11-15 NOTE — Telephone Encounter (Signed)
Refill done.  

## 2014-11-18 ENCOUNTER — Encounter: Payer: Self-pay | Admitting: Internal Medicine

## 2014-11-18 MED ORDER — SIMVASTATIN 20 MG PO TABS: ORAL_TABLET | ORAL | Status: DC

## 2014-11-18 MED ORDER — LEVOTHYROXINE SODIUM 75 MCG PO TABS: ORAL_TABLET | ORAL | Status: DC

## 2014-11-22 ENCOUNTER — Ambulatory Visit
Admission: RE | Admit: 2014-11-22 | Discharge: 2014-11-22 | Disposition: A | Discharge status: Home / Self Care | Payer: Commercial Managed Care - HMO | Source: Ambulatory Visit | Attending: Internal Medicine | Admitting: Internal Medicine

## 2014-11-22 DIAGNOSIS — Z1239 Encounter for other screening for malignant neoplasm of breast: Secondary | ICD-10-CM

## 2014-11-30 ENCOUNTER — Ambulatory Visit
Admission: RE | Admit: 2014-11-30 | Discharge: 2014-11-30 | Disposition: A | Discharge status: Home / Self Care | Payer: Commercial Managed Care - HMO | Source: Ambulatory Visit | Attending: Internal Medicine | Admitting: Internal Medicine

## 2014-11-30 ENCOUNTER — Other Ambulatory Visit: Payer: Self-pay | Admitting: Internal Medicine

## 2014-11-30 DIAGNOSIS — Z1231 Encounter for screening mammogram for malignant neoplasm of breast: Secondary | ICD-10-CM | POA: Diagnosis present

## 2014-11-30 DIAGNOSIS — Z1239 Encounter for other screening for malignant neoplasm of breast: Secondary | ICD-10-CM

## 2014-12-01 ENCOUNTER — Ambulatory Visit: Payer: Self-pay | Admitting: Family Medicine

## 2014-12-02 ENCOUNTER — Encounter: Payer: Self-pay | Admitting: Internal Medicine

## 2014-12-02 DIAGNOSIS — Z0001 Encounter for general adult medical examination with abnormal findings: Secondary | ICD-10-CM | POA: Insufficient documentation

## 2014-12-05 ENCOUNTER — Ambulatory Visit: Payer: Self-pay | Admitting: Family Medicine

## 2014-12-06 ENCOUNTER — Encounter: Payer: Self-pay | Admitting: Internal Medicine

## 2014-12-06 ENCOUNTER — Ambulatory Visit (INDEPENDENT_AMBULATORY_CARE_PROVIDER_SITE_OTHER): Payer: Commercial Managed Care - HMO | Admitting: Internal Medicine

## 2014-12-06 VITALS — BP 90/60 | HR 80 | Temp 98.1°F | Ht 62.5 in | Wt 137.4 lb

## 2014-12-06 DIAGNOSIS — E7800 Pure hypercholesterolemia, unspecified: Secondary | ICD-10-CM

## 2014-12-06 DIAGNOSIS — M7552 Bursitis of left shoulder: Secondary | ICD-10-CM

## 2014-12-06 DIAGNOSIS — E039 Hypothyroidism, unspecified: Secondary | ICD-10-CM | POA: Diagnosis not present

## 2014-12-06 DIAGNOSIS — F32A Depression, unspecified: Secondary | ICD-10-CM

## 2014-12-06 DIAGNOSIS — Z0000 Encounter for general adult medical examination without abnormal findings: Secondary | ICD-10-CM | POA: Diagnosis not present

## 2014-12-06 DIAGNOSIS — F329 Major depressive disorder, single episode, unspecified: Secondary | ICD-10-CM

## 2014-12-06 NOTE — Progress Notes (Signed)
Pre visit review using our clinic review tool, if applicable. No additional management support is needed unless otherwise documented below in the visit note. 

## 2014-12-06 NOTE — Progress Notes (Signed)
Patient ID: Pam Salazar, female   DOB: Apr 28, 1946, 69 y.o.   MRN: [id number]   Subjective:    Patient ID: Pam Salazar, female    DOB: 27-Oct-1945, 69 y.o.   MRN: [id number]  HPI  Patient here for a scheduled follow up.  Had been having problems with her shoulder.  Saw Dr Tamala Julian.  Is s/p injection.  Shoulder better.  She saw psychiatry this week.  Visit went well.  Started on increased effexor.  Had f/u planned in 12/2014.  Has been having issues with increased depression.  No suicidal ideations.  Eating and drinking well.  Sleeping.  Bowels stable.     Past Medical History  Diagnosis Date  . Hypercholesterolemia   . Hypothyroidism   . Depression   . Panic disorder     previous agoraphobia  . Vitamin D deficiency     Current Outpatient Prescriptions on File Prior to Visit  Medication Sig Dispense Refill  . clonazePAM (KLONOPIN) 1 MG tablet Take 1 tablet (1 mg total) by mouth 3 (three) times daily as needed. for anxiety 90 tablet 1  . Ibuprofen-Famotidine 800-26.6 MG TABS Take 1 tablet by mouth 3 (three) times daily. 270 tablet 1  . levothyroxine (SYNTHROID, LEVOTHROID) 75 MCG tablet TAKE 1 TABLET EVERY DAY BEFORE BREAKFAST 90 tablet 3  . POLYETHYLENE GLYCOL 3350 PO     . simvastatin (ZOCOR) 20 MG tablet TAKE 1 TABLET  EVERY EVENING 90 tablet 1  . venlafaxine XR (EFFEXOR-XR) 150 MG 24 hr capsule      No current facility-administered medications on file prior to visit.    Review of Systems  Constitutional: Negative for appetite change and unexpected weight change.  HENT: Negative for congestion and sinus pressure.   Respiratory: Negative for cough, chest tightness and shortness of breath.   Cardiovascular: Negative for chest pain, palpitations and leg swelling.  Gastrointestinal: Negative for nausea, abdominal pain and diarrhea.  Neurological: Negative for dizziness, light-headedness and headaches.  Psychiatric/Behavioral:       Increased depression.  Seeing psychiatry.           Objective:    Physical Exam  Constitutional: She appears well-developed and well-nourished. No distress.  HENT:  Nose: Nose normal.  Mouth/Throat: Oropharynx is clear and moist.  Neck: Neck supple. No thyromegaly present.  Cardiovascular: Normal rate and regular rhythm.   Pulmonary/Chest: Breath sounds normal. No respiratory distress. She has no wheezes.  Abdominal: Soft. Bowel sounds are normal. There is no tenderness.  Musculoskeletal: She exhibits no edema or tenderness.  Lymphadenopathy:    She has no cervical adenopathy.  Skin: No rash noted. No erythema.    BP 90/60 mmHg  Pulse 80  Temp(Src) 98.1 F (36.7 C) (Oral)  Ht 5' 2.5" (1.588 m)  Wt 137 lb 6 oz (62.313 kg)  BMI 24.71 kg/m2  SpO2 96% Wt Readings from Last 3 Encounters:  12/06/14 137 lb 6 oz (62.313 kg)  11/10/14 139 lb (63.05 kg)  10/19/14 136 lb 8 oz (61.916 kg)     Lab Results  Component Value Date   WBC 5.8 08/29/2014   HGB 14.5 08/29/2014   HCT 43.2 08/29/2014   PLT 195.0 08/29/2014   GLUCOSE 85 08/29/2014   CHOL 148 08/29/2014   TRIG 105.0 08/29/2014   HDL 65.90 08/29/2014   LDLCALC 61 08/29/2014   ALT 28 08/29/2014   AST 33 08/29/2014   NA 140 08/29/2014   K 4.0 08/29/2014   CL 107 08/29/2014  CREATININE 0.81 08/29/2014   BUN 22 08/29/2014   CO2 27 08/29/2014   TSH 4.02 08/29/2014    Mm Screening Breast Tomo Bilateral  12/01/2014   CLINICAL DATA:  Screening.  EXAM: DIGITAL SCREENING BILATERAL MAMMOGRAM WITH 3D TOMO WITH CAD  COMPARISON:  Previous exam(s).  ACR Breast Density Category b: There are scattered areas of fibroglandular density.  FINDINGS: There are no findings suspicious for malignancy. Images were processed with CAD.  IMPRESSION: No mammographic evidence of malignancy. A result letter of this screening mammogram will be mailed directly to the patient.  RECOMMENDATION: Screening mammogram in one year. (Code:SM-B-01Y)  BI-RADS CATEGORY  1: Negative.   Electronically Signed   By:  Pamelia Hoit M.D.   On: 12/01/2014 07:54       Assessment & Plan:   Problem List Items Addressed This Visit    Depression    On effexor.  Just increased the dose.  Seeing psychiatry now.        Relevant Medications   venlafaxine XR (EFFEXOR-XR) 75 MG 24 hr capsule   Health care maintenance    Schedule a physical.  Mammogram 12/01/14 - Birads I.       Hypercholesterolemia    On simvastatin.  Low cholesterol diet and exercise.  Check lipid panel and liver function tests.        Hypothyroidism - Primary    On thyroid replacement.  Follow tsh.       Shoulder bursitis    S/p injection.  Doing better.            Einar Pheasant, MD

## 2014-12-13 ENCOUNTER — Encounter: Payer: Self-pay | Admitting: Internal Medicine

## 2014-12-13 NOTE — Assessment & Plan Note (Signed)
On simvastatin.  Low cholesterol diet and exercise.  Check lipid panel and liver function tests.

## 2014-12-13 NOTE — Assessment & Plan Note (Signed)
S/p injection  Doing better

## 2014-12-13 NOTE — Assessment & Plan Note (Signed)
Schedule a physical.  Mammogram 12/01/14 - Birads I.

## 2014-12-13 NOTE — Assessment & Plan Note (Signed)
On thyroid replacement.  Follow tsh.  

## 2014-12-13 NOTE — Assessment & Plan Note (Signed)
On effexor.  Just increased the dose.  Seeing psychiatry now.

## 2014-12-18 ENCOUNTER — Encounter: Payer: Self-pay | Admitting: Internal Medicine

## 2014-12-19 ENCOUNTER — Other Ambulatory Visit: Payer: Self-pay

## 2014-12-19 NOTE — Telephone Encounter (Signed)
Accidentally closed my chart message.  Sent pt my chart message back in response - regarding clonazepam.

## 2014-12-20 ENCOUNTER — Telehealth: Payer: Self-pay | Admitting: Internal Medicine

## 2014-12-20 MED ORDER — CLONAZEPAM 1 MG PO TABS: 1.0000 mg | ORAL_TABLET | Freq: Three times a day (TID) | ORAL | Status: DC | PRN

## 2014-12-20 NOTE — Telephone Encounter (Signed)
Pt notified of clonazepam rx.

## 2014-12-20 NOTE — Addendum Note (Signed)
Addended by: Alisa Graff on: 12/20/2014 01:11 PM   Modules accepted: Orders

## 2014-12-20 NOTE — Telephone Encounter (Signed)
I have refilled the clonazepam #90 with one refill).  rx in your box.

## 2014-12-29 ENCOUNTER — Encounter: Payer: Self-pay | Admitting: Internal Medicine

## 2015-01-12 ENCOUNTER — Encounter: Payer: Self-pay | Admitting: Internal Medicine

## 2015-01-30 ENCOUNTER — Ambulatory Visit: Payer: Self-pay | Admitting: Psychiatry

## 2015-02-09 ENCOUNTER — Other Ambulatory Visit: Payer: Self-pay | Admitting: Internal Medicine

## 2015-02-13 ENCOUNTER — Encounter: Payer: Self-pay | Admitting: Internal Medicine

## 2015-02-13 NOTE — Telephone Encounter (Signed)
Spoke with pt, she does not need to change this appoint.

## 2015-02-14 ENCOUNTER — Ambulatory Visit (INDEPENDENT_AMBULATORY_CARE_PROVIDER_SITE_OTHER): Payer: Commercial Managed Care - HMO | Admitting: Family Medicine

## 2015-02-14 ENCOUNTER — Encounter: Payer: Self-pay | Admitting: Family Medicine

## 2015-02-14 ENCOUNTER — Encounter: Payer: Self-pay | Admitting: Internal Medicine

## 2015-02-14 VITALS — BP 98/62 | HR 69 | Temp 98.5°F | Ht 62.5 in | Wt 134.2 lb

## 2015-02-14 DIAGNOSIS — R197 Diarrhea, unspecified: Secondary | ICD-10-CM | POA: Diagnosis not present

## 2015-02-14 LAB — COMPREHENSIVE METABOLIC PANEL
ALT: 19 U/L (ref 0–35)
AST: 29 U/L (ref 0–37)
Albumin: 4.4 g/dL (ref 3.5–5.2)
Alkaline Phosphatase: 44 U/L (ref 39–117)
BUN: 20 mg/dL (ref 6–23)
CHLORIDE: 106 meq/L (ref 96–112)
CO2: 27 meq/L (ref 19–32)
CREATININE: 0.77 mg/dL (ref 0.40–1.20)
Calcium: 9.5 mg/dL (ref 8.4–10.5)
GFR: 79.07 mL/min (ref >60.00)
Glucose, Bld: 87 mg/dL (ref 70–99)
Potassium: 3.7 mEq/L (ref 3.5–5.1)
SODIUM: 141 meq/L (ref 135–145)
Total Bilirubin: 0.5 mg/dL (ref 0.2–1.2)
Total Protein: 7.1 g/dL (ref 6.0–8.3)

## 2015-02-14 LAB — CBC
HCT: 39.3 % (ref 36.0–46.0)
Hemoglobin: 13.3 g/dL (ref 12.0–15.0)
MCHC: 33.8 g/dL (ref 30.0–36.0)
MCV: 89.7 fl (ref 78.0–100.0)
Platelets: 245 10*3/uL (ref 150.0–400.0)
RBC: 4.37 Mil/uL (ref 3.87–5.11)
RDW: 13.7 % (ref 11.5–15.5)
WBC: 5.1 10*3/uL (ref 4.0–10.5)

## 2015-02-14 LAB — LIPASE: Lipase: 22 U/L (ref 11.0–59.0)

## 2015-02-14 NOTE — Progress Notes (Signed)
Pre visit review using our clinic review tool, if applicable. No additional management support is needed unless otherwise documented below in the visit note. 

## 2015-02-14 NOTE — Assessment & Plan Note (Signed)
New problem. No red flags on exam. Unclear etiology. Obtaining CBC, CMP, Lipase and C diff. Will await C diff/lab work before starting anti-diarrheal. Advised supportive care with increased fluid intake.

## 2015-02-14 NOTE — Patient Instructions (Signed)
It was nice to see you today.  We will call with the results of your labs.  Stay hydrated - Powerade/gatorade (as we discussed).  If you worsen, please let me know.  Call the office and dial Ext. [phone].  Consider stopping abilify for a few days.  Follow up as needed  Take care  Dr. Lacinda Axon

## 2015-02-14 NOTE — Progress Notes (Signed)
   Subjective:  Patient ID: Pam Salazar, female    DOB: 1945/12/03  Age: 69 y.o. MRN: [id number]  CC: Diarrhea  HPI:   69 year old female presents the clinic today with complaints of diarrhea.  1) Diarrhea  Patient reports a 6-7 day history of watery diarrhea.  She reports that she had one night of associated abdominal pain which she described as crampy. She has no abdominal pain currently.  No known inciting factor. No recent antibiotic use, sick contacts. She has not been hospitalized or in a healthcare setting recently. 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  Associated symptoms: She reports chills. She denies any fever. No hematochezia, melena. She reports nausea but denies vomiting.  No exacerbating or relieving factors.  No interventions tried.   Social Hx - Former smoker.  Review of Systems  Constitutional: Positive for chills. Negative for fever.  Gastrointestinal: Positive for diarrhea.   Objective:  BP 98/62 mmHg  Pulse 69  Temp(Src) 98.5 F (36.9 C) (Oral)  Ht 5' 2.5" (1.588 m)  Wt 134 lb 4 oz (60.895 kg)  BMI 24.15 kg/m2  SpO2 97%  BP/Weight 02/14/2015 12/06/2014 9/79/8921  Systolic BP 98 90 194  Diastolic BP 62 60 78  Wt. (Lbs) 134.25 137.38 139  BMI 24.15 24.71 25    Physical Exam  Constitutional: She is oriented to person, place, and time. She appears well-developed and well-nourished.  Appears fatigued.  HENT:  Dry mucous membranes.  Cardiovascular: Normal rate and regular rhythm.   Pulmonary/Chest: Effort normal and breath sounds normal. No respiratory distress. She has no wheezes. She has no rales.  Abdominal: Soft. She exhibits no distension. There is no tenderness. There is no rebound and no guarding.  Musculoskeletal: She exhibits no edema.  Neurological: She is alert and oriented to person, place, and time.  Psychiatric:  Flat affect.    Lab Results  Component Value Date   WBC 5.8 08/29/2014   HGB 14.5  08/29/2014   HCT 43.2 08/29/2014   PLT 195.0 08/29/2014   GLUCOSE 85 08/29/2014   CHOL 148 08/29/2014   TRIG 105.0 08/29/2014   HDL 65.90 08/29/2014   LDLCALC 61 08/29/2014   ALT 28 08/29/2014   AST 33 08/29/2014   NA 140 08/29/2014   K 4.0 08/29/2014   CL 107 08/29/2014   CREATININE 0.81 08/29/2014   BUN 22 08/29/2014   CO2 27 08/29/2014   TSH 4.02 08/29/2014    Assessment & Plan:   Problem List Items Addressed This Visit    Diarrhea - Primary    New problem. No red flags on exam. Unclear etiology. Obtaining CBC, CMP, Lipase and C diff. Will await C diff/lab work before starting anti-diarrheal. Advised supportive care with increased fluid intake.       Relevant Orders   CBC   Comprehensive metabolic panel   Lipase   Clostridium Difficile by PCR     Follow-up: PRN   Thersa Salt, DO

## 2015-03-01 ENCOUNTER — Encounter: Payer: Self-pay | Admitting: Internal Medicine

## 2015-03-02 ENCOUNTER — Encounter: Payer: Self-pay | Admitting: Internal Medicine

## 2015-03-02 ENCOUNTER — Other Ambulatory Visit: Payer: Self-pay | Admitting: Internal Medicine

## 2015-03-02 NOTE — Telephone Encounter (Signed)
If has had abdominal cramping, nausea and persistent loose stool, I think she needs to be reevaluated.  Need to determine the etiology of her ongoing issues.  With these symptoms, I feel needs evaluation today and may need CT scan.  I would recommend [facility] today to rule out anything acute and then we can follow up after.  Let me know if any problems.    Dr Nicki Reaper

## 2015-03-16 ENCOUNTER — Telehealth: Payer: Self-pay

## 2015-03-16 ENCOUNTER — Encounter: Payer: Self-pay | Admitting: Internal Medicine

## 2015-03-16 ENCOUNTER — Ambulatory Visit (INDEPENDENT_AMBULATORY_CARE_PROVIDER_SITE_OTHER): Payer: Commercial Managed Care - HMO | Admitting: Internal Medicine

## 2015-03-16 ENCOUNTER — Ambulatory Visit
Admission: RE | Admit: 2015-03-16 | Discharge: 2015-03-16 | Disposition: A | Discharge status: Home / Self Care | Payer: Commercial Managed Care - HMO | Source: Ambulatory Visit | Attending: Internal Medicine | Admitting: Internal Medicine

## 2015-03-16 VITALS — BP 90/60 | HR 92 | Temp 97.7°F | Resp 18 | Ht 62.5 in | Wt 134.5 lb

## 2015-03-16 DIAGNOSIS — R1084 Generalized abdominal pain: Secondary | ICD-10-CM

## 2015-03-16 DIAGNOSIS — E7800 Pure hypercholesterolemia, unspecified: Secondary | ICD-10-CM

## 2015-03-16 DIAGNOSIS — E039 Hypothyroidism, unspecified: Secondary | ICD-10-CM | POA: Diagnosis not present

## 2015-03-16 DIAGNOSIS — M160 Bilateral primary osteoarthritis of hip: Secondary | ICD-10-CM | POA: Insufficient documentation

## 2015-03-16 DIAGNOSIS — R197 Diarrhea, unspecified: Secondary | ICD-10-CM | POA: Insufficient documentation

## 2015-03-16 DIAGNOSIS — F32A Depression, unspecified: Secondary | ICD-10-CM

## 2015-03-16 DIAGNOSIS — F329 Major depressive disorder, single episode, unspecified: Secondary | ICD-10-CM

## 2015-03-16 DIAGNOSIS — R109 Unspecified abdominal pain: Secondary | ICD-10-CM | POA: Insufficient documentation

## 2015-03-16 DIAGNOSIS — K760 Fatty (change of) liver, not elsewhere classified: Secondary | ICD-10-CM | POA: Diagnosis not present

## 2015-03-16 DIAGNOSIS — R42 Dizziness and giddiness: Secondary | ICD-10-CM

## 2015-03-16 DIAGNOSIS — I959 Hypotension, unspecified: Secondary | ICD-10-CM

## 2015-03-16 LAB — LIPID PANEL
CHOLESTEROL: 174 mg/dL (ref 0–200)
HDL: 60.2 mg/dL (ref >39.00)
LDL Cholesterol: 89 mg/dL (ref 0–99)
NonHDL: 114.1
Total CHOL/HDL Ratio: 3
Triglycerides: 127 mg/dL (ref 0.0–149.0)
VLDL: 25.4 mg/dL (ref 0.0–40.0)

## 2015-03-16 LAB — BASIC METABOLIC PANEL
BUN: 27 mg/dL — AB (ref 6–23)
CHLORIDE: 106 meq/L (ref 96–112)
CO2: 26 meq/L (ref 19–32)
CREATININE: 0.79 mg/dL (ref 0.40–1.20)
Calcium: 9.4 mg/dL (ref 8.4–10.5)
GFR: 76.74 mL/min (ref >60.00)
Glucose, Bld: 85 mg/dL (ref 70–99)
Potassium: 4.2 mEq/L (ref 3.5–5.1)
Sodium: 141 mEq/L (ref 135–145)

## 2015-03-16 LAB — CBC WITH DIFFERENTIAL/PLATELET
BASOS PCT: 0.3 % (ref 0.0–3.0)
Basophils Absolute: 0 10*3/uL (ref 0.0–0.1)
EOS ABS: 0 10*3/uL (ref 0.0–0.7)
Eosinophils Relative: 0 % (ref 0.0–5.0)
HEMATOCRIT: 36.2 % (ref 36.0–46.0)
Hemoglobin: 12.1 g/dL (ref 12.0–15.0)
Lymphocytes Relative: 27.6 % (ref 12.0–46.0)
Lymphs Abs: 1.8 10*3/uL (ref 0.7–4.0)
MCHC: 33.5 g/dL (ref 30.0–36.0)
MCV: 89.2 fl (ref 78.0–100.0)
MONO ABS: 0.6 10*3/uL (ref 0.1–1.0)
Monocytes Relative: 9.7 % (ref 3.0–12.0)
Neutro Abs: 4.1 10*3/uL (ref 1.4–7.7)
Neutrophils Relative %: 62.4 % (ref 43.0–77.0)
Platelets: 247 10*3/uL (ref 150.0–400.0)
RBC: 4.05 Mil/uL (ref 3.87–5.11)
RDW: 13 % (ref 11.5–15.5)
WBC: 6.7 10*3/uL (ref 4.0–10.5)

## 2015-03-16 LAB — TSH: TSH: 15.4 u[IU]/mL — ABNORMAL HIGH (ref 0.35–4.50)

## 2015-03-16 MED ORDER — IOHEXOL 350 MG/ML SOLN
100.0000 mL | Freq: Once | INTRAVENOUS | Status: AC | PRN
  Administered 2015-03-16: 100 mL via INTRAVENOUS

## 2015-03-16 NOTE — Progress Notes (Signed)
Pre-visit discussion using our clinic review tool. No additional management support is needed unless otherwise documented below in the visit note.  

## 2015-03-16 NOTE — Telephone Encounter (Signed)
Xray results called over, reported from [facility].  No acute abnormalities.  Mild/moderate diffused hepat steatosis, bilateral hip degenerative changes noted.   Reported to Dr. Einar Pheasant, Patient was allowed to return to home and provider would call later with results.

## 2015-03-16 NOTE — Telephone Encounter (Signed)
Pt notified of CT results.  She has eaten this pm.  Is drinking fluids.  No abdominal pain.  Follow.  Will call with update in am.  If desires, will see in f/u tomorrow pm at 4:30.

## 2015-03-16 NOTE — Progress Notes (Signed)
Patient ID: Pam Salazar, female   DOB: 1946-02-02, 69 y.o.   MRN: [id number]   Subjective:    Patient ID: Pam Salazar, female    DOB: [birth year], 69 y.o.   MRN: [id number]  HPI  Patient with past history of hypothyroidism and depression.  Comes in today to follow up on these issues.  She is feeling better - regarding her depression.  Seeing psychiatry.  Doing well on her current medication regimen.  She does report she is still having increased intermittent episodes of abdominal cramping and loose stool.  Also some associated nausea.  She reports she had a flare last night.  Describes watery stool.  No blood.  Some abdominal cramping.  Weak today.  Bowels are better today.  She also reports intermittent episodes of vertigo.  Has noticed some with this episode.  Ears have felt more full.  No chest pain or tightness.  No sob.  No headache.     Past Medical History  Diagnosis Date  . Hypercholesterolemia   . Hypothyroidism   . Depression   . Panic disorder     previous agoraphobia  . Vitamin D deficiency    Past Surgical History  Procedure Laterality Date  . Abdominal hysterectomy  1981    partial - secondary to bleeding, ovaries not removed -   . Orif fibula fracture Right 2003    right lower extremitiy  . Tonsillectomy  1974   Family History  Problem Relation Age of Onset  . Arthritis Mother   . Hyperlipidemia Mother   . Arthritis Father   . Prostate cancer Father   . Breast cancer Sister 53  . Heart disease Brother     heart attack  . Breast cancer Maternal Aunt 85  . Colon cancer Maternal Uncle    Social History   Social History  . Marital Status: Married    Spouse Name: N/A  . Number of Children: N/A  . Years of Education: N/A   Social History Main Topics  . Smoking status: Former Smoker    Types: Cigarettes  . Smokeless tobacco: Former Systems developer    Quit date: 07/26/2013     Comment: tryin to quit-using gum as a replacement  . Alcohol Use: No  . Drug Use: No  .  Sexual Activity: Not Asked   Other Topics Concern  . None   Social History Narrative    Outpatient Encounter Prescriptions as of 03/16/2015  Medication Sig  . ARIPiprazole (ABILIFY) 2 MG tablet TK 1/2 T PO QHS  . clonazePAM (KLONOPIN) 1 MG tablet Take 1 tablet (1 mg total) by mouth 3 (three) times daily as needed. for anxiety  . Ibuprofen-Famotidine 800-26.6 MG TABS Take 1 tablet by mouth 3 (three) times daily.  Marland Kitchen POLYETHYLENE GLYCOL 3350 PO   . simvastatin (ZOCOR) 20 MG tablet TAKE 1 TABLET  EVERY EVENING  . venlafaxine XR (EFFEXOR-XR) 150 MG 24 hr capsule TAKE 1 CAPSULE EVERY DAY  . [DISCONTINUED] levothyroxine (SYNTHROID, LEVOTHROID) 75 MCG tablet TAKE 1 TABLET EVERY DAY BEFORE BREAKFAST   No facility-administered encounter medications on file as of 03/16/2015.    Review of Systems  Constitutional: Positive for fatigue.       Still eating.    HENT: Negative for congestion and sinus pressure.   Respiratory: Negative for cough, chest tightness and shortness of breath.   Cardiovascular: Negative for chest pain, palpitations and leg swelling.  Gastrointestinal: Positive for nausea and diarrhea.  Intermittent episodes of loose stool as outlined.  No blood.  Some intermittent abdominal cramping.    Genitourinary: Negative for dysuria and difficulty urinating.  Musculoskeletal: Negative for back pain and joint swelling.  Skin: Negative for color change and rash.  Neurological: Positive for dizziness. Negative for headaches.  Psychiatric/Behavioral: Negative for dysphoric mood and agitation.       Objective:   not orthostatic on exam.  Blood pressure rechecked by me 88-90/62    Physical Exam  Constitutional: She appears well-developed and well-nourished. No distress.  HENT:  Nose: Nose normal.  Mouth/Throat: Oropharynx is clear and moist.  Neck: Neck supple. No thyromegaly present.  Cardiovascular: Normal rate and regular rhythm.   Pulmonary/Chest: Breath sounds normal.  No respiratory distress. She has no wheezes.  Abdominal: Soft. Bowel sounds are normal.  Minimal tenderness to palpation over abdomen.    Musculoskeletal: She exhibits no edema or tenderness.  Lymphadenopathy:    She has no cervical adenopathy.  Skin: No rash noted. No erythema.  Psychiatric: She has a normal mood and affect. Her behavior is normal.    BP 90/60 mmHg  Pulse 92  Temp(Src) 97.7 F (36.5 C) (Oral)  Resp 18  Ht 5' 2.5" (1.588 m)  Wt 134 lb 8 oz (61.009 kg)  BMI 24.19 kg/m2  SpO2 96% Wt Readings from Last 3 Encounters:  03/16/15 134 lb 8 oz (61.009 kg)  02/14/15 134 lb 4 oz (60.895 kg)  12/06/14 137 lb 6 oz (62.313 kg)     Lab Results  Component Value Date   WBC 6.7 03/16/2015   HGB 12.1 03/16/2015   HCT 36.2 03/16/2015   PLT 247.0 03/16/2015   GLUCOSE 85 03/16/2015   CHOL 174 03/16/2015   TRIG 127.0 03/16/2015   HDL 60.20 03/16/2015   LDLCALC 89 03/16/2015   ALT 19 02/14/2015   AST 29 02/14/2015   NA 141 03/16/2015   K 4.2 03/16/2015   CL 106 03/16/2015   CREATININE 0.79 03/16/2015   BUN 27* 03/16/2015   CO2 26 03/16/2015   TSH 15.40* 03/16/2015    Mm Screening Breast Tomo Bilateral  12/01/2014   CLINICAL DATA:  Screening.  EXAM: DIGITAL SCREENING BILATERAL MAMMOGRAM WITH 3D TOMO WITH CAD  COMPARISON:  Previous exam(s).  ACR Breast Density Category b: There are scattered areas of fibroglandular density.  FINDINGS: There are no findings suspicious for malignancy. Images were processed with CAD.  IMPRESSION: No mammographic evidence of malignancy. A result letter of this screening mammogram will be mailed directly to the patient.  RECOMMENDATION: Screening mammogram in one year. (Code:SM-B-01Y)  BI-RADS CATEGORY  1: Negative.   Electronically Signed   By: Pamelia Hoit M.D.   On: 12/01/2014 07:54       Assessment & Plan:   Problem List Items Addressed This Visit    Abdominal pain    Abdominal discomfort as outlined.  Check CT as outlined.  Follow.         Relevant Orders   CT Abdomen Pelvis W Contrast (Completed)   Basic metabolic panel (Completed)   CBC with Differential/Platelet (Completed)   Depression    Seeing psychiatry.  Doing better.  Follow.        Diarrhea - Primary    Persistent intermittent loose stool.  Check electrolytes and cbc.  CT scan as outlined.   Has had recent colonoscopy 02/17/15.        Relevant Orders   CT Abdomen Pelvis W Contrast (Completed)   Basic  metabolic panel (Completed)   CBC with Differential/Platelet (Completed)   Dizziness    Persistent intermittent episodes.  Some ear fullness.  Nasal spray.  Stay hydrated.  Hold on ENT evaluation until can get GI issues under control.       Hypercholesterolemia    Follow lipid panel and liver function tests.  On simvastatin.        Relevant Orders   Lipid panel (Completed)   Hypothyroidism    On thyroid replacement.  Follow tsh.        Relevant Orders   TSH (Completed)   Low blood pressure    Discussed ER evaluation.  Concern over volume depletion.  Bowels are better now.  Intermittent flares.  Still with some abdominal discomfort.  Check CT abdomen and pelvis.  Bland foods.  Stay hydrated.  F/u soon.  Offered f/u appt tomorrow.  She declined ER evaluation.  Check cbc and metabolic panel.            Einar Pheasant, MD

## 2015-03-17 ENCOUNTER — Encounter: Payer: Self-pay | Admitting: Internal Medicine

## 2015-03-17 ENCOUNTER — Other Ambulatory Visit: Payer: Self-pay | Admitting: *Deleted

## 2015-03-17 MED ORDER — ONDANSETRON HCL 4 MG PO TABS: 4.0000 mg | ORAL_TABLET | Freq: Two times a day (BID) | ORAL | Status: DC | PRN

## 2015-03-17 MED ORDER — LEVOTHYROXINE SODIUM 100 MCG PO TABS: ORAL_TABLET | ORAL | Status: DC

## 2015-03-17 NOTE — Telephone Encounter (Signed)
Order placed for zofran #15 with no refills.

## 2015-03-19 ENCOUNTER — Encounter: Payer: Self-pay | Admitting: Internal Medicine

## 2015-03-19 DIAGNOSIS — R42 Dizziness and giddiness: Secondary | ICD-10-CM | POA: Insufficient documentation

## 2015-03-19 NOTE — Assessment & Plan Note (Signed)
On thyroid replacement.  Follow tsh.  

## 2015-03-19 NOTE — Assessment & Plan Note (Signed)
Follow lipid panel and liver function tests.  On simvastatin.   

## 2015-03-19 NOTE — Assessment & Plan Note (Signed)
Abdominal discomfort as outlined.  Check CT as outlined.  Follow.

## 2015-03-19 NOTE — Assessment & Plan Note (Addendum)
Persistent intermittent loose stool.  Check electrolytes and cbc.  CT scan as outlined.   Has had recent colonoscopy 02/17/15.

## 2015-03-19 NOTE — Assessment & Plan Note (Signed)
Seeing psychiatry.  Doing better.  Follow.   

## 2015-03-19 NOTE — Assessment & Plan Note (Signed)
Persistent intermittent episodes.  Some ear fullness.  Nasal spray.  Stay hydrated.  Hold on ENT evaluation until can get GI issues under control.

## 2015-03-19 NOTE — Assessment & Plan Note (Signed)
Discussed ER evaluation.  Concern over volume depletion.  Bowels are better now.  Intermittent flares.  Still with some abdominal discomfort.  Check CT abdomen and pelvis.  Bland foods.  Stay hydrated.  F/u soon.  Offered f/u appt tomorrow.  She declined ER evaluation.  Check cbc and metabolic panel.

## 2015-03-20 ENCOUNTER — Encounter: Payer: Self-pay | Admitting: Internal Medicine

## 2015-03-20 ENCOUNTER — Encounter: Payer: Self-pay | Admitting: *Deleted

## 2015-04-05 ENCOUNTER — Other Ambulatory Visit: Payer: Self-pay | Admitting: Internal Medicine

## 2015-04-05 ENCOUNTER — Encounter: Payer: Self-pay | Admitting: Internal Medicine

## 2015-04-05 DIAGNOSIS — R197 Diarrhea, unspecified: Secondary | ICD-10-CM

## 2015-04-05 NOTE — Progress Notes (Signed)
Order placed for referral to GI 

## 2015-04-09 ENCOUNTER — Other Ambulatory Visit: Payer: Self-pay | Admitting: Internal Medicine

## 2015-04-10 ENCOUNTER — Other Ambulatory Visit: Payer: Self-pay | Admitting: *Deleted

## 2015-04-10 MED ORDER — LEVOTHYROXINE SODIUM 100 MCG PO TABS
ORAL_TABLET | ORAL | Status: DC
Start: 2015-04-10 — End: 2015-04-18

## 2015-04-18 ENCOUNTER — Encounter: Payer: Self-pay | Admitting: Gastroenterology

## 2015-04-18 ENCOUNTER — Other Ambulatory Visit: Payer: Self-pay

## 2015-04-18 ENCOUNTER — Other Ambulatory Visit: Payer: Self-pay | Admitting: Internal Medicine

## 2015-04-18 ENCOUNTER — Ambulatory Visit (INDEPENDENT_AMBULATORY_CARE_PROVIDER_SITE_OTHER): Payer: Self-pay | Admitting: Gastroenterology

## 2015-04-18 VITALS — BP 95/64 | HR 94 | Temp 97.4°F | Ht 62.5 in | Wt 136.0 lb

## 2015-04-18 DIAGNOSIS — R197 Diarrhea, unspecified: Secondary | ICD-10-CM

## 2015-04-18 NOTE — Progress Notes (Signed)
Primary Care Physician: Einar Pheasant, MD  Primary Gastroenterologist:  Dr. Lucilla Lame  Chief Complaint  Patient presents with  . Abdominal Pain    Diarrhea    HPI: Pam Salazar is a 69 y.o. female here Because of diarrhea. The patient reports that she has been having crampy abdominal pain and diarrhea.The patient went to see her primary care provider and was told to take Imodium. The patient states that this slows the diarrhea down that she continues to have diarrhea. The patient had a colonoscopy by me with polyps removed over year ago but was not having diarrhea that time. She states she stopped very products without any change in her diarrhea. She also reports that she has a mild weight loss of a few pounds. The diarrhea has been going on for the last three months. There's no family history of colon cancer or colon polyps. She also denies any black stools or bloody stools  Current Outpatient Prescriptions  Medication Sig Dispense Refill  . ARIPiprazole (ABILIFY) 2 MG tablet TK 1/2 T PO QHS  1  . clonazePAM (KLONOPIN) 1 MG tablet Take 1 tablet (1 mg total) by mouth 3 (three) times daily as needed. for anxiety 90 tablet 0  . Ibuprofen-Famotidine 800-26.6 MG TABS Take 1 tablet by mouth 3 (three) times daily. 270 tablet 1  . levothyroxine (SYNTHROID, LEVOTHROID) 100 MCG tablet TAKE 1 TABLET BY MOUTH EVERY DAY BEFORE BREAKFAST 30 tablet 0  . ondansetron (ZOFRAN) 4 MG tablet Take 1 tablet (4 mg total) by mouth 2 (two) times daily as needed for nausea or vomiting. 15 tablet 0  . POLYETHYLENE GLYCOL 3350 PO     . simvastatin (ZOCOR) 20 MG tablet TAKE 1 TABLET  EVERY EVENING 90 tablet 1  . venlafaxine XR (EFFEXOR-XR) 150 MG 24 hr capsule TAKE 1 CAPSULE EVERY DAY 90 capsule 0   No current facility-administered medications for this visit.    Allergies as of 04/18/2015 - Review Complete 04/18/2015  Allergen Reaction Noted  . Duloxetine hcl Other (See Comments) 04/14/2015  . Vytorin   [ezetimibe-simvastatin] Other (See Comments) 04/14/2015    ROS:  General: Negative for anorexia, weight loss, fever, chills, fatigue, weakness. ENT: Negative for hoarseness, difficulty swallowing , nasal congestion. CV: Negative for chest pain, angina, palpitations, dyspnea on exertion, peripheral edema.  Respiratory: Negative for dyspnea at rest, dyspnea on exertion, cough, sputum, wheezing.  GI: See history of present illness. GU:  Negative for dysuria, hematuria, urinary incontinence, urinary frequency, nocturnal urination.  Endo: Negative for unusual weight change.    Physical Examination:   BP 95/64 mmHg  Pulse 94  Temp(Src) 97.4 F (36.3 C)  Ht 5' 2.5" (1.588 m)  Wt 136 lb (61.689 kg)  BMI 24.46 kg/m2  General: Well-nourished, well-developed in no acute distress.  Eyes: No icterus. Conjunctivae pink. Mouth: Oropharyngeal mucosa moist and pink , no lesions erythema or exudate. Lungs: Clear to auscultation bilaterally. Non-labored. Heart: Regular rate and rhythm, no murmurs rubs or gallops.  Abdomen: Bowel sounds are normal, nontender, nondistended, no hepatosplenomegaly or masses, no abdominal bruits or hernia , no rebound or guarding.   Extremities: No lower extremity edema. No clubbing or deformities. Neuro: Alert and oriented x 3.  Grossly intact. Skin: Warm and dry, no jaundice.   Psych: Alert and cooperative, normal mood and affect.  Labs:    Imaging Studies: No results found.  Assessment and Plan:   Pam Salazar is a 69 y.o. y/o female  Who  comes in today with a report of diarrhea since the beginning of August. The patient reports that the diarrhea will wake her up at night. The patient had a colonoscopy a little over a year ago with polyps found that no other abnormalities. The patient differential diagnosis includes microscopic colitis versus inflammatory bowel disease versus possible food intolerance. The patient has been explained the plan and agrees with it. I  have discussed risks & benefits which include, but are not limited to, bleeding, infection, perforation & drug reaction.  The patient agrees with this plan & written consent will be obtained.      Note: This dictation was prepared with Dragon dictation along with smaller phrase technology. Any transcriptional errors that result from this process are unintentional.

## 2015-04-28 ENCOUNTER — Encounter: Payer: Self-pay | Admitting: *Deleted

## 2015-05-01 ENCOUNTER — Other Ambulatory Visit (INDEPENDENT_AMBULATORY_CARE_PROVIDER_SITE_OTHER): Payer: Commercial Managed Care - HMO

## 2015-05-01 ENCOUNTER — Telehealth: Payer: Self-pay | Admitting: *Deleted

## 2015-05-01 DIAGNOSIS — E039 Hypothyroidism, unspecified: Secondary | ICD-10-CM

## 2015-05-01 LAB — TSH: TSH: 0.45 u[IU]/mL (ref 0.35–4.50)

## 2015-05-01 NOTE — Telephone Encounter (Signed)
Labs and dx?  

## 2015-05-01 NOTE — Telephone Encounter (Signed)
Order placed for tsh 

## 2015-05-02 ENCOUNTER — Encounter: Payer: Self-pay | Admitting: *Deleted

## 2015-05-04 NOTE — Discharge Instructions (Signed)

## 2015-05-05 ENCOUNTER — Ambulatory Visit
Admission: RE | Admit: 2015-05-05 | Discharge: 2015-05-05 | Disposition: A | Discharge status: Home / Self Care | Payer: Commercial Managed Care - HMO | Source: Ambulatory Visit | Attending: Gastroenterology | Admitting: Gastroenterology

## 2015-05-05 ENCOUNTER — Encounter: Payer: Self-pay | Admitting: *Deleted

## 2015-05-05 ENCOUNTER — Ambulatory Visit: Payer: Commercial Managed Care - HMO | Admitting: Anesthesiology

## 2015-05-05 ENCOUNTER — Other Ambulatory Visit: Payer: Self-pay | Admitting: Gastroenterology

## 2015-05-05 ENCOUNTER — Encounter
Admission: RE | Disposition: A | Discharge status: Home / Self Care | Payer: Self-pay | Source: Ambulatory Visit | Attending: Gastroenterology

## 2015-05-05 DIAGNOSIS — F410 Panic disorder [episodic paroxysmal anxiety] without agoraphobia: Secondary | ICD-10-CM | POA: Insufficient documentation

## 2015-05-05 DIAGNOSIS — E7800 Pure hypercholesterolemia, unspecified: Secondary | ICD-10-CM | POA: Diagnosis not present

## 2015-05-05 DIAGNOSIS — E039 Hypothyroidism, unspecified: Secondary | ICD-10-CM | POA: Insufficient documentation

## 2015-05-05 DIAGNOSIS — Z79899 Other long term (current) drug therapy: Secondary | ICD-10-CM | POA: Diagnosis not present

## 2015-05-05 DIAGNOSIS — K5521 Angiodysplasia of colon with hemorrhage: Secondary | ICD-10-CM | POA: Diagnosis not present

## 2015-05-05 DIAGNOSIS — Z888 Allergy status to other drugs, medicaments and biological substances status: Secondary | ICD-10-CM | POA: Insufficient documentation

## 2015-05-05 DIAGNOSIS — F329 Major depressive disorder, single episode, unspecified: Secondary | ICD-10-CM | POA: Diagnosis not present

## 2015-05-05 DIAGNOSIS — K529 Noninfective gastroenteritis and colitis, unspecified: Secondary | ICD-10-CM | POA: Insufficient documentation

## 2015-05-05 DIAGNOSIS — Z90710 Acquired absence of both cervix and uterus: Secondary | ICD-10-CM | POA: Insufficient documentation

## 2015-05-05 DIAGNOSIS — E559 Vitamin D deficiency, unspecified: Secondary | ICD-10-CM | POA: Insufficient documentation

## 2015-05-05 DIAGNOSIS — Z87891 Personal history of nicotine dependence: Secondary | ICD-10-CM | POA: Diagnosis not present

## 2015-05-05 DIAGNOSIS — R197 Diarrhea, unspecified: Secondary | ICD-10-CM | POA: Diagnosis present

## 2015-05-05 HISTORY — DX: Dizziness and giddiness: R42

## 2015-05-05 HISTORY — DX: Pain in left shoulder: M25.512

## 2015-05-05 HISTORY — DX: Unspecified osteoarthritis, unspecified site: M19.90

## 2015-05-05 HISTORY — DX: Unspecified convulsions: R56.9

## 2015-05-05 HISTORY — PX: COLONOSCOPY WITH PROPOFOL: SHX5780

## 2015-05-05 SURGERY — COLONOSCOPY WITH PROPOFOL
Anesthesia: Monitor Anesthesia Care

## 2015-05-05 MED ORDER — PROPOFOL 10 MG/ML IV BOLUS
INTRAVENOUS | Status: DC | PRN
  Administered 2015-05-05 (×2): 80 mg via INTRAVENOUS
  Administered 2015-05-05: 10 mg via INTRAVENOUS

## 2015-05-05 MED ORDER — LACTATED RINGERS IV SOLN
INTRAVENOUS | Status: DC
  Administered 2015-05-05 (×2): via INTRAVENOUS

## 2015-05-05 MED ORDER — STERILE WATER FOR IRRIGATION IR SOLN
Status: DC | PRN
  Administered 2015-05-05: 11:00:00

## 2015-05-05 MED ORDER — LIDOCAINE HCL (CARDIAC) 20 MG/ML IV SOLN
INTRAVENOUS | Status: DC | PRN
  Administered 2015-05-05: 30 mg via INTRAVENOUS

## 2015-05-05 SURGICAL SUPPLY — 30 items
CANISTER SUCT 1200ML W/VALVE (MISCELLANEOUS) ×3 IMPLANT
FCP ESCP3.2XJMB 240X2.8X (MISCELLANEOUS)
FORCEPS BIOP RAD 4 LRG CAP 4 (CUTTING FORCEPS) IMPLANT
FORCEPS BIOP RJ4 240 W/NDL (MISCELLANEOUS)
FORCEPS ESCP3.2XJMB 240X2.8X (MISCELLANEOUS) IMPLANT
GOWN CVR UNV OPN BCK APRN NK (MISCELLANEOUS) ×2 IMPLANT
GOWN ISOL THUMB LOOP REG UNIV (MISCELLANEOUS) ×6
HEMOCLIP INSTINCT (CLIP) IMPLANT
INJECTOR VARIJECT VIN23 (MISCELLANEOUS) IMPLANT
KIT CO2 TUBING (TUBING) IMPLANT
KIT DEFENDO VALVE AND CONN (KITS) IMPLANT
KIT ENDO PROCEDURE OLY (KITS) ×3 IMPLANT
KIT ROOM TURNOVER OR (KITS) ×3 IMPLANT
LIGATOR MULTIBAND 6SHOOTER MBL (MISCELLANEOUS) IMPLANT
MARKER SPOT ENDO TATTOO 5ML (MISCELLANEOUS) IMPLANT
PAD GROUND ADULT SPLIT (MISCELLANEOUS) IMPLANT
PROBE APC STR FIRE (PROBE) ×3 IMPLANT
SNARE SHORT THROW 13M SML OVAL (MISCELLANEOUS) ×2 IMPLANT
SNARE SHORT THROW 30M LRG OVAL (MISCELLANEOUS) IMPLANT
SPOT EX ENDOSCOPIC TATTOO (MISCELLANEOUS)
SUCTION POLY TRAP 4CHAMBER (MISCELLANEOUS) IMPLANT
TRAP SUCTION POLY (MISCELLANEOUS) IMPLANT
TUBING CONN 6MMX3.1M (TUBING)
TUBING SUCTION CONN 0.25 STRL (TUBING) IMPLANT
UNDERPAD 30X60 958B10 (PK) (MISCELLANEOUS) IMPLANT
VALVE BIOPSY ENDO (VALVE) IMPLANT
VARIJECT INJECTOR VIN23 (MISCELLANEOUS)
WATER AUXILLARY (MISCELLANEOUS) ×2 IMPLANT
WATER STERILE IRR 250ML POUR (IV SOLUTION) ×3 IMPLANT
WATER STERILE IRR 500ML POUR (IV SOLUTION) IMPLANT

## 2015-05-05 NOTE — Anesthesia Preprocedure Evaluation (Signed)
Anesthesia Evaluation  Patient identified by MRN, date of birth, ID band Patient awake    Reviewed: Allergy & Precautions, H&P , NPO status , Patient's Chart, lab work & pertinent test results, reviewed documented beta blocker date and time   Airway Mallampati: II  TM Distance: >3 FB Neck ROM: full    Dental no notable dental hx.    Pulmonary neg pulmonary ROS, former smoker,    Pulmonary exam normal breath sounds clear to auscultation       Cardiovascular Exercise Tolerance: Good negative cardio ROS   Rhythm:regular Rate:Normal     Neuro/Psych PSYCHIATRIC DISORDERS (depression, anxiety/panic disorder) negative neurological ROS     GI/Hepatic negative GI ROS, Neg liver ROS,   Endo/Other  Hypothyroidism   Renal/GU negative Renal ROS  negative genitourinary   Musculoskeletal   Abdominal   Peds  Hematology negative hematology ROS (+)   Anesthesia Other Findings   Reproductive/Obstetrics negative OB ROS                             Anesthesia Physical Anesthesia Plan  ASA: II  Anesthesia Plan: MAC   Post-op Pain Management:    Induction:   Airway Management Planned:   Additional Equipment:   Intra-op Plan:   Post-operative Plan:   Informed Consent: I have reviewed the patients History and Physical, chart, labs and discussed the procedure including the risks, benefits and alternatives for the proposed anesthesia with the patient or authorized representative who has indicated his/her understanding and acceptance.   Dental Advisory Given  Plan Discussed with: CRNA  Anesthesia Plan Comments:         Anesthesia Quick Evaluation

## 2015-05-05 NOTE — Op Note (Signed)
[facility] Patient Name: Pam Salazar Procedure Date: 05/05/2015 10:27 AM MRN: [id number] Account #: [id number] Date of Birth: [birth year] Admit Type: Outpatient Age: 69 Room: [facility] OR ROOM 01 Gender: Female Note Status: Finalized Procedure:         Colonoscopy Indications:       Chronic diarrhea Providers:         Lucilla Lame, MD Referring MD:      Einar Pheasant, MD (Referring MD) Medicines:         Propofol per Anesthesia Complications:     No immediate complications. Procedure:         Pre-Anesthesia Assessment:                    - Prior to the procedure, a History and Physical was                     performed, and patient medications and allergies were                     reviewed. The patient's tolerance of previous anesthesia                     was also reviewed. The risks and benefits of the procedure                     and the sedation options and risks were discussed with the                     patient. All questions were answered, and informed consent                     was obtained. Prior Anticoagulants: The patient has taken                     no previous anticoagulant or antiplatelet agents. ASA                     Grade Assessment: II - A patient with mild systemic                     disease. After reviewing the risks and benefits, the                     patient was deemed in satisfactory condition to undergo                     the procedure.                    After obtaining informed consent, the colonoscope was                     passed under direct vision. Throughout the procedure, the                     patient's blood pressure, pulse, and oxygen saturations                     were monitored continuously. The Olympus CF-HQ190L                     Colonoscope (S#. [id number]) was introduced through the anus  and advanced to the the cecum, identified by appendiceal                     orifice and  ileocecal valve. The colonoscopy was performed                     without difficulty. The patient tolerated the procedure                     well. The quality of the bowel preparation was excellent. Findings:      The perianal and digital rectal examinations were normal.      The mucosa vascular pattern in the entire colon was diffusely increased.      Multiple small localized angiodysplastic lesions with bleeding were       found in the descending colon. Coagulation for hemostasis using argon       beam at 2 liters/minute and 30 watts was successful.      Random biopsies were obtained with cold forceps for histology in the       entire colon. Impression:        - Increased mucosa vascular pattern in the entire examined                     colon.                    - The entire examined colon is normal.                    - Multiple bleeding colonic angiodysplastic lesions.                     Treated with argon beam coagulation.                    - Random biopsies were obtained in the entire colon. Recommendation:    - Await pathology results. Procedure Code(s): --- Professional ---                    [id number], 56, Colonoscopy, flexible; with control of                     bleeding, any method                    45380, Colonoscopy, flexible; with biopsy, single or                     multiple Diagnosis Code(s): --- Professional ---                    K52.9, Noninfective gastroenteritis and colitis,                     unspecified                    K55.21, Angiodysplasia of colon with hemorrhage CPT copyright 2014 American Medical Association. All rights reserved. The codes documented in this report are preliminary and upon coder review may  be revised to meet current compliance requirements. Lucilla Lame, MD 05/05/2015 11:02:16 AM This report has been signed electronically. Number of Addenda: 0 Note Initiated On: 05/05/2015 10:27 AM Scope Withdrawal Time: 0 hours 7 minutes 6  seconds  Total Procedure Duration: 0 hours 14 minutes 37 seconds       [facility]

## 2015-05-05 NOTE — H&P (Signed)
[facility]  [address], [place], [place] [address] Phone: [phone] Fax : [phone]  Primary Care Physician:  Einar Pheasant, MD Primary Gastroenterologist:  Dr. Allen Norris  Pre-Procedure History & Physical: HPI:  Pam Salazar is a 69 y.o. female is here for an colonoscopy.   Past Medical History  Diagnosis Date  . Hypercholesterolemia   . Hypothyroidism   . Depression   . Panic disorder     previous agoraphobia  . Vitamin D deficiency   . Low blood pressure 10/25/2014  . Vertigo     positional - rare  . Arthritis     hips, lower back  . Seizures (HCC)     febrile - as infant  . Shoulder pain, left     bursa    Past Surgical History  Procedure Laterality Date  . Abdominal hysterectomy  1981    partial - secondary to bleeding, ovaries not removed -   . Orif fibula fracture Right 2003    right lower extremitiy  . Tonsillectomy  1974  . Cataract extraction w/ intraocular lens  implant, bilateral      Prior to Admission medications   Medication Sig Start Date End Date Taking? Authorizing Provider  ARIPiprazole (ABILIFY) 2 MG tablet TK 1/2 T PO QHS 02/03/15  Yes Historical Provider, MD  beta carotene w/minerals (OCUVITE) tablet Take 1 tablet by mouth daily. AM   Yes Historical Provider, MD  BIOTIN PO Take by mouth.   Yes Historical Provider, MD  Cholecalciferol (VITAMIN D PO) Take by mouth.   Yes Historical Provider, MD  clonazePAM (KLONOPIN) 1 MG tablet Take 1 tablet (1 mg total) by mouth 3 (three) times daily as needed. for anxiety 12/20/14  Yes Einar Pheasant, MD  ondansetron (ZOFRAN) 4 MG tablet Take 1 tablet (4 mg total) by mouth 2 (two) times daily as needed for nausea or vomiting. 03/17/15  Yes Einar Pheasant, MD  POLYETHYLENE GLYCOL 3350 PO  10/13/14  Yes Historical Provider, MD  simvastatin (ZOCOR) 20 MG tablet TAKE 1 TABLET  EVERY EVENING 04/18/15  Yes Einar Pheasant, MD  venlafaxine XR (EFFEXOR-XR) 150 MG 24 hr capsule TAKE 1 CAPSULE EVERY  DAY 04/18/15  Yes Einar Pheasant, MD  Ibuprofen-Famotidine 800-26.6 MG TABS Take 1 tablet by mouth 3 (three) times daily. 11/15/14   Lyndal Pulley, DO  levothyroxine (SYNTHROID, LEVOTHROID) 100 MCG tablet TAKE 1 TABLET BY MOUTH EVERY DAY BEFORE BREAKFAST 04/10/15   Einar Pheasant, MD    Allergies as of 04/18/2015 - Review Complete 04/18/2015  Allergen Reaction Noted  . Duloxetine hcl Other (See Comments) 04/14/2015  . Vytorin  [ezetimibe-simvastatin] Other (See Comments) 04/14/2015    Family History  Problem Relation Age of Onset  . Arthritis Mother   . Hyperlipidemia Mother   . Arthritis Father   . Prostate cancer Father   . Breast cancer Sister 57  . Heart disease Brother     heart attack  . Breast cancer Maternal Aunt 85  . Colon cancer Maternal Uncle     Social History   Social History  . Marital Status: Married    Spouse Name: N/A  . Number of Children: N/A  . Years of Education: N/A   Occupational History  . Not on file.   Social History Main Topics  . Smoking status: Former Smoker    Types: Cigarettes  . Smokeless tobacco: Former Systems developer    Quit date: 07/26/2013     Comment:    . Alcohol  Use: No  . Drug Use: No  . Sexual Activity: Not on file   Other Topics Concern  . Not on file   Social History Narrative    Review of Systems: See HPI, otherwise negative ROS  Physical Exam: BP 97/68 mmHg  Pulse 81  Temp(Src) 99 F (37.2 C) (Temporal)  Resp 12  Ht 5' 2.5" (1.588 m)  Wt 132 lb (59.875 kg)  BMI 23.74 kg/m2  SpO2 96% General:   Alert,  pleasant and cooperative in NAD Head:  Normocephalic and atraumatic. Neck:  Supple; no masses or thyromegaly. Lungs:  Clear throughout to auscultation.    Heart:  Regular rate and rhythm. Abdomen:  Soft, nontender and nondistended. Normal bowel sounds, without guarding, and without rebound.   Neurologic:  Alert and  oriented x4;  grossly normal neurologically.  Impression/Plan: [name] is here for an  colonoscopy to be performed for diarrhea  Risks, benefits, limitations, and alternatives regarding  colonoscopy have been reviewed with the patient.  Questions have been answered.  All parties agreeable.   Ollen Bowl, MD  05/05/2015, 9:46 AM

## 2015-05-05 NOTE — Anesthesia Procedure Notes (Signed)
Procedure Name: MAC Performed by: Rucker Pridgeon Pre-anesthesia Checklist: Patient identified, Emergency Drugs available, Suction available, Patient being monitored and Timeout performed Patient Re-evaluated:Patient Re-evaluated prior to inductionOxygen Delivery Method: Nasal cannula       

## 2015-05-05 NOTE — Anesthesia Postprocedure Evaluation (Signed)
  Anesthesia Post-op Note  Patient: Pam Salazar  Procedure(s) Performed: Procedure(s): COLONOSCOPY WITH PROPOFOL (N/A)  Anesthesia type:MAC  Patient location: PACU  Post pain: Pain level controlled  Post assessment: Post-op Vital signs reviewed, Patient's Cardiovascular Status Stable, Respiratory Function Stable, Patent Airway and No signs of Nausea or vomiting  Post vital signs: Reviewed and stable  Last Vitals:  Filed Vitals:   05/05/15 1100  BP: 92/58  Pulse: 84  Temp: 36.5 C  Resp: 17    Level of consciousness: awake, alert  and patient cooperative  Complications: No apparent anesthesia complications

## 2015-05-05 NOTE — Transfer of Care (Signed)
Immediate Anesthesia Transfer of Care Note  Patient: Pam Salazar  Procedure(s) Performed: Procedure(s): COLONOSCOPY WITH PROPOFOL (N/A)  Patient Location: PACU  Anesthesia Type: MAC  Level of Consciousness: awake, alert  and patient cooperative  Airway and Oxygen Therapy: Patient Spontanous Breathing and Patient connected to supplemental oxygen  Post-op Assessment: Post-op Vital signs reviewed, Patient's Cardiovascular Status Stable, Respiratory Function Stable, Patent Airway and No signs of Nausea or vomiting  Post-op Vital Signs: Reviewed and stable  Complications: No apparent anesthesia complications

## 2015-05-08 ENCOUNTER — Encounter: Payer: Self-pay | Admitting: Gastroenterology

## 2015-05-09 ENCOUNTER — Telehealth: Payer: Self-pay | Admitting: Internal Medicine

## 2015-05-09 NOTE — Telephone Encounter (Signed)
Pt sent a My chart message stating she received a letter to get a physical and she states that a office visit would suffice. And that she had one on 03/16/2015. What do you suggest that the pt have? She's waiting on biopsy results from colonoscopy performed 05/05/15. She also states that Will f/u with Dr. Allen Norris afterwards. I'm taking the 100 mcg of Levothyroxin. Have got to make an appt with Dr. Creig Hines for bursitis in Lf shoulder. March will be 6 mos since I've seen you. Can I wait until then? What do you suggest? Thank You!

## 2015-05-09 NOTE — Telephone Encounter (Signed)
Call and notify pt that I do recommend a physical appt with me before March.  Regarding her pathology, GI usually follows up with biopsy results and then forwards info to me.  Let me know if any problems.

## 2015-05-10 ENCOUNTER — Other Ambulatory Visit (INDEPENDENT_AMBULATORY_CARE_PROVIDER_SITE_OTHER): Payer: Commercial Managed Care - HMO

## 2015-05-10 ENCOUNTER — Encounter: Payer: Self-pay | Admitting: Family Medicine

## 2015-05-10 ENCOUNTER — Ambulatory Visit (INDEPENDENT_AMBULATORY_CARE_PROVIDER_SITE_OTHER): Payer: Commercial Managed Care - HMO | Admitting: Family Medicine

## 2015-05-10 ENCOUNTER — Telehealth: Payer: Self-pay | Admitting: Family Medicine

## 2015-05-10 VITALS — BP 110/68 | HR 111 | Ht 62.5 in | Wt 138.0 lb

## 2015-05-10 DIAGNOSIS — M25512 Pain in left shoulder: Secondary | ICD-10-CM

## 2015-05-10 DIAGNOSIS — M25562 Pain in left knee: Secondary | ICD-10-CM

## 2015-05-10 DIAGNOSIS — S76302A Unspecified injury of muscle, fascia and tendon of the posterior muscle group at thigh level, left thigh, initial encounter: Secondary | ICD-10-CM | POA: Diagnosis not present

## 2015-05-10 DIAGNOSIS — M7552 Bursitis of left shoulder: Secondary | ICD-10-CM | POA: Diagnosis not present

## 2015-05-10 MED ORDER — METHOCARBAMOL 500 MG PO TABS: 500.0000 mg | ORAL_TABLET | Freq: Three times a day (TID) | ORAL | Status: DC

## 2015-05-10 NOTE — Patient Instructions (Signed)
Good to see you Hamstring exercises 3 times a week Stay active Compression sleeve to the thigh can help the hamstring Injected shoulder again today Happy holidays! See me again in 4-6 weeks to make sure the hamstring heals perfectly.

## 2015-05-10 NOTE — Assessment & Plan Note (Signed)
Patient does have a distal hamstring injury. There is hypoechoic changes. I do think the patient will do well with conservative therapy. We discussed compression, patient given home exercises, icing protocol. Patient is going to come back and see me again in 4 weeks. Continued have trouble imaging may be necessary. We discussed icing. Patient work with Product/process development scientist. Patient and will come back and see me again in 4 weeks to make sure that she is healing appropriately.

## 2015-05-10 NOTE — Progress Notes (Signed)
[facility] [address] [place], [place] [address] Phone: [phone] Subjective:    I'm seeing this patient by the request  of:  Einar Pheasant, MD   CC: left shoulder pain.  [id number] Pam Salazar is a 69 y.o. female coming in with complaint of shoulder pain.  Patient was seen 5 months ago and was given an injection in the left shoulder. Patient was doing fairly well but unfortunately last month started having increasing pain again. Seen this previously.   Patient also is complaining of left knee pain. Patient 48 hours ago was trying to go for jog with her dog and had to take an abdominal stab. Had pain immediately on the posterior medial aspect of the knee. States that she had to stop running. Had significant stiffness the next day but now seems to be improving slowly. Denies any bruising or swelling that she noted. 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     Past medical history, social, surgical and family history all reviewed in electronic medical record.   Review of Systems: No headache, visual changes, nausea, vomiting, diarrhea, constipation, dizziness, abdominal pain, skin rash, fevers, chills, night sweats, weight loss, swollen lymph nodes, body aches, joint swelling, muscle aches, chest pain, shortness of breath, mood changes.   Objective Blood pressure 110/68, pulse 111, weight 138 lb (62.596 kg), SpO2 96 %.  General: No apparent distress alert and oriented x3 mood and affect normal, dressed appropriately.  HEENT: Pupils equal, extraocular movements intact  Respiratory: Patient's speak in full sentences and does not appear short of breath  Cardiovascular: No lower extremity edema, non tender, no erythema  Skin: Warm dry intact with no signs of infection or rash on extremities or on axial skeleton.  Abdomen: Soft nontender  Neuro: Cranial nerves II through XII are intact, neurovascularly  intact in all extremities with 2+ DTRs and 2+ pulses.  Lymph: No lymphadenopathy of posterior or anterior cervical chain or axillae bilaterally.  Gait  Mild antalgic gait MSK:  Non tender with full range of motion and good stability and symmetric strength and tone of  elbows, wrist, hip, and ankles bilaterally.  Shoulder: left Inspection reveals no abnormalities, atrophy or asymmetry. Palpation is normal with no tenderness over AC joint or bicipital groove. ROM is full in all planes passively. Rotator cuff strength normal throughout. signs of impingement with positive Neer and Hawkin's tests, but negative empty can sign. Speeds and Yergason's tests normal. No labral pathology noted with negative Obrien's, negative clunk and good stability. Normal scapular function observed. No painful arc and no drop arm sign. No apprehension sign    Procedure: Real-time Ultrasound Guided Injection of left glenohumeral joint Device: GE Logiq E  Ultrasound guided injection is preferred based studies that show increased duration, increased effect, greater accuracy, decreased procedural pain, increased response rate with ultrasound guided versus blind injection.  Verbal informed consent obtained.  Time-out conducted.  Noted no overlying erythema, induration, or other signs of local infection.  Skin prepped in a sterile fashion.  Local anesthesia: Topical Ethyl chloride.  With sterile technique and under real time ultrasound guidance:  Joint visualized.  23g 1  inch needle inserted posterior approach. Pictures taken for needle placement. Patient did have injection of 2 cc of 1% lidocaine, 2 cc of 0.5% Marcaine, and 1.0 cc of Kenalog 40 mg/dL. Completed without difficulty  Pain immediately resolved suggesting accurate placement of the medication.  Advised to call if fevers/chills, erythema, induration, drainage, or persistent bleeding.  Images permanently stored and available for review in the ultrasound  unit.  Impression: Technically successful ultrasound guided injection.  Procedure note [id number]; 15 minutes spent for Therapeutic exercises as stated in above notes.  This included exercises focusing on stretching, strengthening, with significant focus on eccentric aspects.    The exercises given focusing on eccentric strengthening of the hamstring as well as stretching quadricep. Gastrocnemius stretches and eccentric exercises given. Rows with therabandProper technique shown and discussed handout in great detail with ATC.  All questions were discussed and answered.      Impression and Recommendations:     This case required medical decision making of moderate complexity.

## 2015-05-10 NOTE — Progress Notes (Signed)
Pre visit review using our clinic review tool, if applicable. No additional management support is needed unless otherwise documented below in the visit note. 

## 2015-05-10 NOTE — Assessment & Plan Note (Signed)
Patient was given an injection and tolerated the procedure very well. We discussed icing regimen and home exercises. We discussed which activities do an which ones to avoid. Patient continues to have significant amount of muscular skeletal complaints. We may need to consider further work up.

## 2015-05-11 NOTE — Telephone Encounter (Signed)
I am sorry, I was thinking it was an acute visit.  She just had some other issues when she came in.  She can schedule a regular f/u with me in the next few months.  Let me know if any problems.  Sorry.

## 2015-05-11 NOTE — Telephone Encounter (Signed)
Pt states that she had a CPE in September (it looks like she is right)-please advise. Pt aware to contact GI for bx results.

## 2015-05-13 ENCOUNTER — Other Ambulatory Visit: Payer: Self-pay

## 2015-05-13 DIAGNOSIS — K529 Noninfective gastroenteritis and colitis, unspecified: Secondary | ICD-10-CM

## 2015-05-13 MED ORDER — BUDESONIDE 3 MG PO CPEP: 9.0000 mg | ORAL_CAPSULE | ORAL | Status: DC

## 2015-05-15 ENCOUNTER — Telehealth: Payer: Self-pay

## 2015-05-15 NOTE — Telephone Encounter (Signed)
Advised pt Dr. Allen Norris would like her to come in for a follow up to discuss results. Per pt, she already knows she has colitis so she just needs to know what kind and what rx she needs. Per Dr. Allen Norris, she has microscopic colitis and will need budesonide 9mg  daily for 8 weeks. Rx was sent to pt's pharmacy.

## 2015-05-15 NOTE — Telephone Encounter (Signed)
-----   Message from Lucilla Lame, MD sent at 05/08/2015  3:47 PM EST ----- Please have the patient come in for a follow up.

## 2015-06-07 ENCOUNTER — Ambulatory Visit: Payer: Self-pay | Admitting: Family Medicine

## 2015-07-11 ENCOUNTER — Ambulatory Visit (INDEPENDENT_AMBULATORY_CARE_PROVIDER_SITE_OTHER): Payer: Medicare HMO

## 2015-07-11 VITALS — BP 108/68 | HR 89 | Temp 97.4°F | Resp 12 | Ht 62.0 in | Wt 141.4 lb

## 2015-07-11 DIAGNOSIS — Z23 Encounter for immunization: Secondary | ICD-10-CM | POA: Diagnosis not present

## 2015-07-11 DIAGNOSIS — Z0000 Encounter for general adult medical examination without abnormal findings: Secondary | ICD-10-CM

## 2015-07-11 NOTE — Progress Notes (Signed)
Subjective:   Pam Salazar is a 70 y.o. female who presents for an Initial Medicare Annual Wellness Visit.  Review of Systems    No ROS.  Medicare Wellness Visit.  Cardiac Risk Factors include: advanced age (>36men, >63 women)     Objective:    Today's Vitals   07/11/15 1303  BP: 108/68  Pulse: 89  Temp: 97.4 F (36.3 C)  TempSrc: Oral  Resp: 12  Height: 5\' 2"  (1.575 m)  Weight: 141 lb 6.4 oz (64.139 kg)  SpO2: 99%    Current Medications (verified) Outpatient Encounter Prescriptions as of 07/11/2015  Medication Sig  . ARIPiprazole (ABILIFY) 2 MG tablet TK 1/2 T PO QHS  . beta carotene w/minerals (OCUVITE) tablet Take 1 tablet by mouth daily. AM  . BIOTIN PO Take by mouth.  . Cholecalciferol (VITAMIN D PO) Take by mouth.  . clonazePAM (KLONOPIN) 1 MG tablet Take 1 tablet (1 mg total) by mouth 3 (three) times daily as needed. for anxiety  . levothyroxine (SYNTHROID, LEVOTHROID) 100 MCG tablet TAKE 1 TABLET BY MOUTH EVERY DAY BEFORE BREAKFAST  . simvastatin (ZOCOR) 20 MG tablet TAKE 1 TABLET  EVERY EVENING  . venlafaxine XR (EFFEXOR-XR) 150 MG 24 hr capsule TAKE 1 CAPSULE EVERY DAY  . [DISCONTINUED] Ibuprofen-Famotidine 800-26.6 MG TABS Take 1 tablet by mouth 3 (three) times daily.  . [DISCONTINUED] ondansetron (ZOFRAN) 4 MG tablet Take 1 tablet (4 mg total) by mouth 2 (two) times daily as needed for nausea or vomiting.  . [DISCONTINUED] POLYETHYLENE GLYCOL 3350 PO   . methocarbamol (ROBAXIN) 500 MG tablet Take 1 tablet (500 mg total) by mouth 3 (three) times daily. (Patient not taking: Reported on 07/11/2015)  . [DISCONTINUED] budesonide (ENTOCORT EC) 3 MG 24 hr capsule Take 3 capsules (9 mg total) by mouth every morning. (Patient not taking: Reported on 07/11/2015)   No facility-administered encounter medications on file as of 07/11/2015.    Allergies (verified) Duloxetine hcl and Vytorin   History: Past Medical History  Diagnosis Date  . Hypercholesterolemia   .  Hypothyroidism   . Depression   . Panic disorder     previous agoraphobia  . Vitamin D deficiency   . Low blood pressure 10/25/2014  . Vertigo     positional - rare  . Arthritis     hips, lower back  . Seizures (HCC)     febrile - as infant  . Shoulder pain, left     bursa   Past Surgical History  Procedure Laterality Date  . Abdominal hysterectomy  1981    partial - secondary to bleeding, ovaries not removed -   . Orif fibula fracture Right 2003    right lower extremitiy  . Tonsillectomy  1974  . Cataract extraction w/ intraocular lens  implant, bilateral    . Colonoscopy with propofol N/A 05/05/2015    Procedure: COLONOSCOPY WITH PROPOFOL;  Surgeon: Lucilla Lame, MD;  Location: [facility];  Service: Endoscopy;  Laterality: N/A;   Family History  Problem Relation Age of Onset  . Arthritis Mother   . Hyperlipidemia Mother   . Arthritis Father   . Prostate cancer Father   . Breast cancer Sister 60  . Heart disease Brother     heart attack  . Diabetes Brother   . Breast cancer Maternal Aunt 85  . Colon cancer Maternal Uncle    Social History   Occupational History  . Not on file.   Social History Main Topics  .  Smoking status: Former Smoker    Types: Cigarettes  . Smokeless tobacco: Former Systems developer    Quit date: 07/26/2013     Comment:    . Alcohol Use: No  . Drug Use: No  . Sexual Activity: Yes    Tobacco Counseling Counseling given: Not Answered   Activities of Daily Living In your present state of health, do you have any difficulty performing the following activities: 07/11/2015  Hearing? N  Vision? N  Difficulty concentrating or making decisions? N  Walking or climbing stairs? N  Dressing or bathing? N  Doing errands, shopping? N  Preparing Food and eating ? N  Using the Toilet? N  In the past six months, have you accidently leaked urine? N  Do you have problems with loss of bowel control? N  Managing your Medications? N  Managing your  Finances? N  Housekeeping or managing your Housekeeping? N    Immunizations and Health Maintenance Immunization History  Administered Date(s) Administered  . Influenza Split 06/28/2013, 05/27/2014  . Influenza-Unspecified 05/25/2015  . Pneumococcal Conjugate-13 08/03/2013  . Pneumococcal Polysaccharide-23 07/11/2015  . Zoster 06/24/2008   Health Maintenance Due  Topic Date Due  . Hepatitis C Screening  04-Jun-1946  . TETANUS/TDAP  06/03/1965  . DEXA SCAN  06/04/2011    Patient Care Team: Einar Pheasant, MD as PCP - General (Internal Medicine) Seeplaputhur Robinette Haines, MD as Consulting Physician (General Surgery) Einar Pheasant, MD (Internal Medicine)  Indicate any recent Medical Services you may have received from other than [facility] providers in the past year (date may be approximate).     Assessment:   This is a routine wellness examination for Pam Salazar.  The goal of the wellness visit is to assist the patient how to close the gaps in care and create a preventative care plan for the patient.    Osteoporosis risk reviewed.  Medications reviewed; taking without issues or barriers.  Safety issues reviewed; smoke detectors in the home. Firearms locked in a secure area in the home. Wears seatbelts when driving or riding with others. No violence in the home.  The patient was oriented x 3; appropriate in dress and manner and no objective failures at ADL's or IADL's.   TDAP postponed, per patient request.  Follow up with insurance.  DEXA Scan postponed for follow up with PCP, per patient request.  Patient Concerns:  Vaginal dryness; painful during intercourse, currently uses OTC lubricants, but not working. Requests Rx.  Deferred to follow up with PCP. Hep C Screening to be completed at upcoming follow up.    Hearing/Vision screen Hearing Screening Comments: Passes the whisper test Vision Screening Comments: Followed by Dr. Mordecai Rasmussen Bilateral cataracts removed Wears reader  glasses Annual visits  Dietary issues and exercise activities discussed: Current Exercise Habits:: Home exercise routine, Type of exercise: calisthenics;walking, Frequency (Times/Week): 5, Intensity: Moderate  Goals    . Increase physical activity     Begin yoga classes Arm exercises using kettle ball, as tolerated      Depression Screen PHQ 2/9 Scores 07/11/2015 03/16/2015 08/03/2014 04/14/2013  PHQ - 2 Score 0 0 0 0    Fall Risk Fall Risk  07/11/2015 03/16/2015 08/03/2014 04/14/2013  Falls in the past year? Yes Yes Yes No  Number falls in past yr: 1 1 1  -  Injury with Fall? Yes Yes No -  Risk for fall due to : - - Impaired balance/gait -  Follow up Education provided;Falls prevention discussed - Falls evaluation completed -  Cognitive Function: MMSE - Mini Mental State Exam 07/11/2015  Orientation to time 5  Orientation to Place 5  Registration 3  Attention/ Calculation 5  Recall 3  Language- name 2 objects 2  Language- repeat 1  Language- follow 3 step command 3  Language- read & follow direction 1  Write a sentence 1  Copy design 1  Total score 30    Screening Tests Health Maintenance  Topic Date Due  . Hepatitis C Screening  1945-09-08  . TETANUS/TDAP  06/03/1965  . DEXA SCAN  06/04/2011  . INFLUENZA VACCINE  01/23/2016  . MAMMOGRAM  11/29/2016  . COLONOSCOPY  05/04/2025  . ZOSTAVAX  Completed  . PNA vac Low Risk Adult  Completed      Plan:    End of life planning; Advance aging; Advanced directives discussed. Copy requested of current HCPOA/Living Will.   During the course of the visit, Pam Salazar was educated and counseled about the following appropriate screening and preventive services:   Vaccines to include Pneumoccal, Influenza, Hepatitis B, Td, Zostavax, HCV  Electrocardiogram  Cardiovascular disease screening  Colorectal cancer screening  Bone density screening  Diabetes screening  Glaucoma screening  Mammography/PAP  Nutrition  counseling  Smoking cessation counseling  Patient Instructions (the written plan) were given to the patient.    Varney Biles, LPN   [id number]    Reviewed above information.  To follow up as instructed.    Dr Nicki Reaper

## 2015-07-11 NOTE — Patient Instructions (Addendum)
Pam Salazar,  Thank you for taking time to come for your Medicare Wellness Visit.  I appreciate your ongoing commitment to your health goals. Please review the following plan we discussed and let me know if I can assist you in the future.  Bone Densitometry Bone densitometry is an imaging test that uses a special X-ray to measure the amount of calcium and other minerals in your bones (bone density). This test is also known as a bone mineral density test or dual-energy X-ray absorptiometry (DXA). The test can measure bone density at your hip and your spine. It is similar to having a regular X-ray. You may have this test to:  Diagnose a condition that causes weak or thin bones (osteoporosis).  Predict your risk of a broken bone (fracture).  Determine how well osteoporosis treatment is working. LET [facility] CARE PROVIDER KNOW ABOUT:  Any allergies you have.  All medicines you are taking, including vitamins, herbs, eye drops, creams, and over-the-counter medicines.  Previous problems you or members of your family have had with the use of anesthetics.  Any blood disorders you have.  Previous surgeries you have had.  Medical conditions you have.  Possibility of pregnancy.  Any other medical test you had within the previous 14 days that used contrast material. RISKS AND COMPLICATIONS Generally, this is a safe procedure. However, problems can occur and may include the following:  This test exposes you to a very small amount of radiation.  The risks of radiation exposure may be greater to unborn children. BEFORE THE PROCEDURE  Do not take any calcium supplements for 24 hours before having the test. You can otherwise eat and drink what you usually do.  Take off all metal jewelry, eyeglasses, dental appliances, and any other metal objects. PROCEDURE  You may lie on an exam table. There will be an X-ray generator below you and an imaging device above you.  Other devices, such as  boxes or braces, may be used to position your body properly for the scan.  You will need to lie still while the machine slowly scans your body.  The images will show up on a computer monitor. AFTER THE PROCEDURE You may need more testing at a later time.   This information is not intended to replace advice given to you by your health care provider. Make sure you discuss any questions you have with your health care provider.   Document Released: 07/02/2004 Document Revised: 07/01/2014 Document Reviewed: 11/18/2013 Elsevier Interactive Patient Education 2016 [place] in the Home  Falls can cause injuries. They can happen to people of all ages. There are many things you can do to make your home safe and to help prevent falls.  WHAT CAN I DO ON THE OUTSIDE OF MY HOME?  Regularly fix the edges of walkways and driveways and fix any cracks.  Remove anything that might make you trip as you walk through a door, such as a raised step or threshold.  Trim any bushes or trees on the path to your home.  Use bright outdoor lighting.  Clear any walking paths of anything that might make someone trip, such as rocks or tools.  Regularly check to see if handrails are loose or broken. Make sure that both sides of any steps have handrails.  Any raised decks and porches should have guardrails on the edges.  Have any leaves, snow, or ice cleared regularly.  Use sand or salt on walking paths during winter.  Clean up any spills in your garage right away. This includes oil or grease spills. WHAT CAN I DO IN THE BATHROOM?   Use night lights.  Install grab bars by the toilet and in the tub and shower. Do not use towel bars as grab bars.  Use non-skid mats or decals in the tub or shower.  If you need to sit down in the shower, use a plastic, non-slip stool.  Keep the floor dry. Clean up any water that spills on the floor as soon as it happens.  Remove soap buildup in the tub  or shower regularly.  Attach bath mats securely with double-sided non-slip rug tape.  Do not have throw rugs and other things on the floor that can make you trip. WHAT CAN I DO IN THE BEDROOM?  Use night lights.  Make sure that you have a light by your bed that is easy to reach.  Do not use any sheets or blankets that are too big for your bed. They should not hang down onto the floor.  Have a firm chair that has side arms. You can use this for support while you get dressed.  Do not have throw rugs and other things on the floor that can make you trip. WHAT CAN I DO IN THE KITCHEN?  Clean up any spills right away.  Avoid walking on wet floors.  Keep items that you use a lot in easy-to-reach places.  If you need to reach something above you, use a strong step stool that has a grab bar.  Keep electrical cords out of the way.  Do not use floor polish or wax that makes floors slippery. If you must use wax, use non-skid floor wax.  Do not have throw rugs and other things on the floor that can make you trip. WHAT CAN I DO WITH MY STAIRS?  Do not leave any items on the stairs.  Make sure that there are handrails on both sides of the stairs and use them. Fix handrails that are broken or loose. Make sure that handrails are as long as the stairways.  Check any carpeting to make sure that it is firmly attached to the stairs. Fix any carpet that is loose or worn.  Avoid having throw rugs at the top or bottom of the stairs. If you do have throw rugs, attach them to the floor with carpet tape.  Make sure that you have a light switch at the top of the stairs and the bottom of the stairs. If you do not have them, ask someone to add them for you. WHAT ELSE CAN I DO TO HELP PREVENT FALLS?  Wear shoes that:  Do not have high heels.  Have rubber bottoms.  Are comfortable and fit you well.  Are closed at the toe. Do not wear sandals.  If you use a stepladder:  Make sure that it is  fully opened. Do not climb a closed stepladder.  Make sure that both sides of the stepladder are locked into place.  Ask someone to hold it for you, if possible.  Clearly mark and make sure that you can see:  Any grab bars or handrails.  First and last steps.  Where the edge of each step is.  Use tools that help you move around (mobility aids) if they are needed. These include:  Canes.  Walkers.  Scooters.  Crutches.  Turn on the lights when you go into a dark area. Replace any light bulbs as  soon as they burn out.  Set up your furniture so you have a clear path. Avoid moving your furniture around.  If any of your floors are uneven, fix them.  If there are any pets around you, be aware of where they are.  Review your medicines with your doctor. Some medicines can make you feel dizzy. This can increase your chance of falling. Ask your doctor what other things that you can do to help prevent falls.   This information is not intended to replace advice given to you by your health care provider. Make sure you discuss any questions you have with your health care provider.   Document Released: 04/06/2009 Document Revised: 10/25/2014 Document Reviewed: 07/15/2014 Elsevier Interactive Patient Education [employer].

## 2015-08-10 ENCOUNTER — Encounter: Payer: Self-pay | Admitting: Internal Medicine

## 2015-08-10 ENCOUNTER — Ambulatory Visit (INDEPENDENT_AMBULATORY_CARE_PROVIDER_SITE_OTHER): Payer: Medicare HMO | Admitting: Internal Medicine

## 2015-08-10 VITALS — BP 106/64 | HR 76 | Temp 97.8°F | Resp 14 | Ht 62.0 in | Wt 138.4 lb

## 2015-08-10 DIAGNOSIS — E7800 Pure hypercholesterolemia, unspecified: Secondary | ICD-10-CM | POA: Diagnosis not present

## 2015-08-10 DIAGNOSIS — E2839 Other primary ovarian failure: Secondary | ICD-10-CM

## 2015-08-10 DIAGNOSIS — E039 Hypothyroidism, unspecified: Secondary | ICD-10-CM

## 2015-08-10 DIAGNOSIS — F329 Major depressive disorder, single episode, unspecified: Secondary | ICD-10-CM | POA: Diagnosis not present

## 2015-08-10 DIAGNOSIS — F32A Depression, unspecified: Secondary | ICD-10-CM

## 2015-08-10 NOTE — Progress Notes (Signed)
Patient ID: Pam Salazar, female   DOB: March 11, 1946, 70 y.o.   MRN: [id number]   Subjective:    Patient ID: Pam Salazar, female    DOB: Nov 28, 1945, 70 y.o.   MRN: [id number]  HPI  Patient with past history of hypercholesterolemia, depression and hypothyroidism.  She comes in today to follow up on these issues.  She is doing well.  Still seeing psychiatry.  Has decreased her clonazepam.  Doing much better.  Still taking abilify.  This has helped.  She is exercising.  No chest pain or tightness.  No sob.  Eating and drinking well.  No abdominal pain or cramping.  Bowels stable.     Past Medical History  Diagnosis Date  . Hypercholesterolemia   . Hypothyroidism   . Depression   . Panic disorder     previous agoraphobia  . Vitamin D deficiency   . Low blood pressure 10/25/2014  . Vertigo     positional - rare  . Arthritis     hips, lower back  . Seizures (HCC)     febrile - as infant  . Shoulder pain, left     bursa   Past Surgical History  Procedure Laterality Date  . Abdominal hysterectomy  1981    partial - secondary to bleeding, ovaries not removed -   . Orif fibula fracture Right 2003    right lower extremitiy  . Tonsillectomy  1974  . Cataract extraction w/ intraocular lens  implant, bilateral    . Colonoscopy with propofol N/A 05/05/2015    Procedure: COLONOSCOPY WITH PROPOFOL;  Surgeon: Lucilla Lame, MD;  Location: [facility];  Service: Endoscopy;  Laterality: N/A;   Family History  Problem Relation Age of Onset  . Arthritis Mother   . Hyperlipidemia Mother   . Arthritis Father   . Prostate cancer Father   . Breast cancer Sister 36  . Heart disease Brother     heart attack  . Diabetes Brother   . Breast cancer Maternal Aunt 85  . Colon cancer Maternal Uncle    Social History   Social History  . Marital Status: Married    Spouse Name: N/A  . Number of Children: N/A  . Years of Education: N/A   Social History Main Topics  . Smoking status: Former  Smoker    Types: Cigarettes  . Smokeless tobacco: Former Systems developer    Quit date: 07/26/2013     Comment:    . Alcohol Use: No  . Drug Use: No  . Sexual Activity: Yes   Other Topics Concern  . None   Social History Narrative    Outpatient Encounter Prescriptions as of 08/10/2015  Medication Sig  . ARIPiprazole (ABILIFY) 2 MG tablet TK 1/2 T PO QHS  . beta carotene w/minerals (OCUVITE) tablet Take 1 tablet by mouth daily. AM  . BIOTIN PO Take by mouth.  . Cholecalciferol (VITAMIN D PO) Take by mouth.  . clonazePAM (KLONOPIN) 0.5 MG tablet Take 0.5 mg by mouth 2 (two) times daily.  Marland Kitchen levothyroxine (SYNTHROID, LEVOTHROID) 100 MCG tablet TAKE 1 TABLET BY MOUTH EVERY DAY BEFORE BREAKFAST  . methocarbamol (ROBAXIN) 500 MG tablet Take 1 tablet (500 mg total) by mouth 3 (three) times daily.  . simvastatin (ZOCOR) 20 MG tablet TAKE 1 TABLET  EVERY EVENING  . venlafaxine XR (EFFEXOR-XR) 150 MG 24 hr capsule TAKE 1 CAPSULE EVERY DAY  . [DISCONTINUED] clonazePAM (KLONOPIN) 1 MG tablet Take 1 tablet (1 mg total)  by mouth 3 (three) times daily as needed. for anxiety   No facility-administered encounter medications on file as of 08/10/2015.    Review of Systems  Constitutional: Negative for appetite change and unexpected weight change.  HENT: Negative for congestion and sinus pressure.   Respiratory: Negative for cough, chest tightness and shortness of breath.   Cardiovascular: Negative for chest pain, palpitations and leg swelling.  Gastrointestinal: Negative for nausea, vomiting, abdominal pain and diarrhea.  Genitourinary: Negative for dysuria and difficulty urinating.  Musculoskeletal: Negative for back pain and joint swelling.  Skin: Negative for color change and rash.  Neurological: Negative for dizziness, light-headedness and headaches.  Psychiatric/Behavioral: Negative for dysphoric mood and agitation.       Objective:     Blood pressure rechecked by me:  104/68  Physical Exam    Constitutional: She appears well-developed and well-nourished. No distress.  HENT:  Nose: Nose normal.  Mouth/Throat: Oropharynx is clear and moist.  Neck: Neck supple. No thyromegaly present.  Cardiovascular: Normal rate and regular rhythm.   Pulmonary/Chest: Breath sounds normal. No respiratory distress. She has no wheezes.  Abdominal: Soft. Bowel sounds are normal. There is no tenderness.  Musculoskeletal: She exhibits no edema or tenderness.  Lymphadenopathy:    She has no cervical adenopathy.  Skin: No rash noted. No erythema.  Psychiatric: She has a normal mood and affect. Her behavior is normal.    BP 106/64 mmHg  Pulse 76  Temp(Src) 97.8 F (36.6 C) (Oral)  Resp 14  Ht 5\' 2"  (1.575 m)  Wt 138 lb 6.4 oz (62.778 kg)  BMI 25.31 kg/m2  SpO2 99% Wt Readings from Last 3 Encounters:  08/10/15 138 lb 6.4 oz (62.778 kg)  07/11/15 141 lb 6.4 oz (64.139 kg)  05/10/15 138 lb (62.596 kg)     Lab Results  Component Value Date   WBC 6.7 03/16/2015   HGB 12.1 03/16/2015   HCT 36.2 03/16/2015   PLT 247.0 03/16/2015   GLUCOSE 85 03/16/2015   CHOL 174 03/16/2015   TRIG 127.0 03/16/2015   HDL 60.20 03/16/2015   LDLCALC 89 03/16/2015   ALT 19 02/14/2015   AST 29 02/14/2015   NA 141 03/16/2015   K 4.2 03/16/2015   CL 106 03/16/2015   CREATININE 0.79 03/16/2015   BUN 27* 03/16/2015   CO2 26 03/16/2015   TSH 0.45 05/01/2015       Assessment & Plan:   Problem List Items Addressed This Visit    Depression    Seeing psychiatry.  Doing better.  On abilify and this has helped.        Hypercholesterolemia    On simvastatin.  Low cholesterol diet and exercise.  Follow lipid panel.        Relevant Orders   Lipid panel   Hepatic function panel   Basic metabolic panel   Hypothyroidism    On thyroid replacement.  Follow tsh.       Relevant Orders   TSH    Other Visit Diagnoses    Estrogen deficiency    -  Primary    Relevant Orders    DG Bone Density         Einar Pheasant, MD

## 2015-08-10 NOTE — Progress Notes (Signed)
Pre visit review using our clinic review tool, if applicable. No additional management support is needed unless otherwise documented below in the visit note. 

## 2015-08-13 ENCOUNTER — Encounter: Payer: Self-pay | Admitting: Internal Medicine

## 2015-08-13 NOTE — Assessment & Plan Note (Signed)
Seeing psychiatry.  Doing better.  On abilify and this has helped.

## 2015-08-13 NOTE — Assessment & Plan Note (Signed)
On thyroid replacement.  Follow tsh.  

## 2015-08-13 NOTE — Assessment & Plan Note (Signed)
On simvastatin.  Low cholesterol diet and exercise.  Follow lipid panel.  

## 2015-08-17 ENCOUNTER — Other Ambulatory Visit (INDEPENDENT_AMBULATORY_CARE_PROVIDER_SITE_OTHER): Payer: Medicare HMO

## 2015-08-17 DIAGNOSIS — E039 Hypothyroidism, unspecified: Secondary | ICD-10-CM | POA: Diagnosis not present

## 2015-08-17 DIAGNOSIS — E7800 Pure hypercholesterolemia, unspecified: Secondary | ICD-10-CM | POA: Diagnosis not present

## 2015-08-17 LAB — LIPID PANEL
CHOLESTEROL: 177 mg/dL (ref 0–200)
HDL: 67.6 mg/dL (ref >39.00)
LDL CALC: 84 mg/dL (ref 0–99)
NonHDL: 109.84
TRIGLYCERIDES: 127 mg/dL (ref 0.0–149.0)
Total CHOL/HDL Ratio: 3
VLDL: 25.4 mg/dL (ref 0.0–40.0)

## 2015-08-17 LAB — HEPATIC FUNCTION PANEL
ALT: 17 U/L (ref 0–35)
AST: 24 U/L (ref 0–37)
Albumin: 4.5 g/dL (ref 3.5–5.2)
Alkaline Phosphatase: 56 U/L (ref 39–117)
BILIRUBIN DIRECT: 0.1 mg/dL (ref 0.0–0.3)
BILIRUBIN TOTAL: 0.6 mg/dL (ref 0.2–1.2)
Total Protein: 7.1 g/dL (ref 6.0–8.3)

## 2015-08-17 LAB — BASIC METABOLIC PANEL
BUN: 23 mg/dL (ref 6–23)
CALCIUM: 9.6 mg/dL (ref 8.4–10.5)
CO2: 29 meq/L (ref 19–32)
CREATININE: 0.81 mg/dL (ref 0.40–1.20)
Chloride: 106 mEq/L (ref 96–112)
GFR: 74.47 mL/min (ref >60.00)
GLUCOSE: 85 mg/dL (ref 70–99)
Potassium: 4.2 mEq/L (ref 3.5–5.1)
Sodium: 142 mEq/L (ref 135–145)

## 2015-08-17 LAB — TSH: TSH: 0.52 u[IU]/mL (ref 0.35–4.50)

## 2015-08-18 ENCOUNTER — Encounter: Payer: Self-pay | Admitting: Internal Medicine

## 2015-09-12 ENCOUNTER — Ambulatory Visit
Admission: RE | Admit: 2015-09-12 | Discharge: 2015-09-12 | Disposition: A | Discharge status: Home / Self Care | Payer: Medicare HMO | Source: Ambulatory Visit | Attending: Internal Medicine | Admitting: Internal Medicine

## 2015-09-12 DIAGNOSIS — E2839 Other primary ovarian failure: Secondary | ICD-10-CM | POA: Diagnosis not present

## 2015-09-13 ENCOUNTER — Encounter: Payer: Self-pay | Admitting: Internal Medicine

## 2015-10-02 ENCOUNTER — Telehealth: Payer: Self-pay | Admitting: *Deleted

## 2015-10-02 ENCOUNTER — Other Ambulatory Visit: Payer: Self-pay | Admitting: Internal Medicine

## 2015-10-02 MED ORDER — LEVOTHYROXINE SODIUM 100 MCG PO TABS: ORAL_TABLET | ORAL | Status: DC

## 2015-10-02 MED ORDER — SIMVASTATIN 20 MG PO TABS: ORAL_TABLET | ORAL | Status: DC

## 2015-10-02 NOTE — Telephone Encounter (Signed)
Rx refilled.

## 2015-10-20 ENCOUNTER — Encounter: Payer: Self-pay | Admitting: Nurse Practitioner

## 2015-10-20 ENCOUNTER — Ambulatory Visit (INDEPENDENT_AMBULATORY_CARE_PROVIDER_SITE_OTHER): Payer: Medicare HMO | Admitting: Nurse Practitioner

## 2015-10-20 VITALS — BP 100/68 | HR 80 | Resp 12 | Ht 62.0 in | Wt 140.0 lb

## 2015-10-20 DIAGNOSIS — H9201 Otalgia, right ear: Secondary | ICD-10-CM | POA: Diagnosis not present

## 2015-10-20 DIAGNOSIS — M7552 Bursitis of left shoulder: Secondary | ICD-10-CM | POA: Diagnosis not present

## 2015-10-20 NOTE — Patient Instructions (Signed)
Your ear looks great!   We will call you about your referral to orthopedics.

## 2015-10-20 NOTE — Progress Notes (Signed)
Patient ID: Pam Salazar, female    DOB: 06-29-1945  Age: 70 y.o. MRN: [id number]  CC: Acute Visit   HPI Pam Salazar presents for CC of right ear ache x 1 week.   1) Pt reports she has had a right ear ache for 1 week. At the time she called she reports it was much worse and has steadily improved. She thought about cancelling the appointment today even. She has not tried anything to date or seen another provider. Denies discharge from ear or fever/chills/sweats. Pt does report intermittent popping sounds of right ear.   2) Pt also asked before leaving if she could have a referral to ortho for her left shoulder pain. She has seen Dr. Tamala Julian and thinks the world of him, but she reports the injections are not helpful and she feels this is moving towards a surgical consult. We discussed shoulder specific docs in [place].   History Pam Salazar has a past medical history of Hypercholesterolemia; Hypothyroidism; Depression; Panic disorder; Vitamin D deficiency; Low blood pressure (10/25/2014); Vertigo; Arthritis; Seizures ([place]); and Shoulder pain, left.   She has past surgical history that includes Abdominal hysterectomy (1981); ORIF fibula fracture (Right, 2003); Tonsillectomy (1974); Cataract extraction w/ intraocular lens  implant, bilateral; and Colonoscopy with propofol (N/A, 05/05/2015).   Her family history includes Arthritis in her father and mother; Breast cancer (age of onset: 37) in her sister; Breast cancer (age of onset: 13) in her maternal aunt; Colon cancer in her maternal uncle; Diabetes in her brother; Heart disease in her brother; Hyperlipidemia in her mother; Prostate cancer in her father.She reports that she has quit smoking. Her smoking use included Cigarettes. She quit smokeless tobacco use about 2 years ago. She reports that she does not drink alcohol or use illicit drugs.  Outpatient Prescriptions Prior to Visit  Medication Sig Dispense Refill  . ARIPiprazole (ABILIFY) 2 MG tablet TK 1/2 T PO QHS   1  . beta carotene w/minerals (OCUVITE) tablet Take 1 tablet by mouth daily. AM    . BIOTIN PO Take by mouth.    . Cholecalciferol (VITAMIN D PO) Take by mouth.    . clonazePAM (KLONOPIN) 0.5 MG tablet Take 0.5 mg by mouth 2 (two) times daily.    Marland Kitchen levothyroxine (SYNTHROID, LEVOTHROID) 100 MCG tablet TAKE 1 TABLET BY MOUTH EVERY DAY BEFORE BREAKFAST 30 tablet 11  . methocarbamol (ROBAXIN) 500 MG tablet Take 1 tablet (500 mg total) by mouth 3 (three) times daily. 90 tablet 0  . simvastatin (ZOCOR) 20 MG tablet TAKE 1 TABLET  EVERY EVENING 90 tablet 3  . venlafaxine XR (EFFEXOR-XR) 150 MG 24 hr capsule TAKE 1 CAPSULE EVERY DAY 90 capsule 0   No facility-administered medications prior to visit.    ROS Review of Systems  Constitutional: Negative for fever, chills, diaphoresis and fatigue.  HENT: Positive for ear pain. Negative for congestion, ear discharge, postnasal drip, rhinorrhea, sinus pressure, sneezing, sore throat, tinnitus, trouble swallowing and voice change.   Respiratory: Negative for chest tightness, shortness of breath and wheezing.   Cardiovascular: Negative for chest pain, palpitations and leg swelling.  Gastrointestinal: Negative for nausea, vomiting and diarrhea.  Skin: Negative for rash.  Neurological: Negative for dizziness, weakness, numbness and headaches.  Psychiatric/Behavioral: The patient is not nervous/anxious.     Objective:  BP 100/68 mmHg  Pulse 80  Resp 12  Ht 5\' 2"  (1.575 m)  Wt 140 lb (63.504 kg)  BMI 25.60 kg/m2  SpO2 97%  Physical Exam  Constitutional: She is oriented to person, place, and time. She appears well-developed and well-nourished. No distress.  HENT:  Head: Normocephalic and atraumatic.  Right Ear: External ear normal.  Left Ear: External ear normal.  Mouth/Throat: Oropharynx is clear and moist.  Right TM blocked by a yellow wet cerumen- after removed TM is clear with landmarks visible, no injection/bulging/retraction   Cardiovascular: Normal rate, regular rhythm and normal heart sounds.   Pulmonary/Chest: Effort normal and breath sounds normal. No respiratory distress. She has no wheezes. She has no rales. She exhibits no tenderness.  Neurological: She is alert and oriented to person, place, and time. No cranial nerve deficit. She exhibits normal muscle tone. Coordination normal.  Skin: Skin is warm and dry. No rash noted. She is not diaphoretic.  Psychiatric: She has a normal mood and affect. Her behavior is normal. Judgment and thought content normal.   Assessment & Plan:   Pam Salazar was seen today for acute visit.  Diagnoses and all orders for this visit:  Shoulder bursitis, left -     Ambulatory referral to Orthopedic Surgery  Pain in right ear   I am having Pam Salazar maintain her ARIPiprazole, venlafaxine XR, beta carotene w/minerals, Cholecalciferol (VITAMIN D PO), BIOTIN PO, methocarbamol, clonazePAM, levothyroxine, and simvastatin.  No orders of the defined types were placed in this encounter.     Follow-up: Return if symptoms worsen or fail to improve.

## 2015-10-24 DIAGNOSIS — H9201 Otalgia, right ear: Secondary | ICD-10-CM | POA: Insufficient documentation

## 2015-10-24 NOTE — Assessment & Plan Note (Signed)
Pt loves Dr. Tamala Julian, but reports due to worsening of pain and injections not being helpful- she would like to see a shoulder Ortho doc for consultation at this time. Referral placed

## 2015-10-24 NOTE — Assessment & Plan Note (Addendum)
Pt did well with curette removal of 1 piece of wax that obstructed the view of right TM. Once removed TM was normal and canal was without findings as well. She reports she felt she could hear better and felt there was improvement at this time.  FU prn worsening/failure to improve.   I have spent 27 minutes face to face with the patient with > 50% of time spent on removal of ear wax myself, inspection, discussion of shoulder pain and ear pain, and treatment options for both.

## 2015-11-13 ENCOUNTER — Encounter: Payer: Self-pay | Admitting: Family

## 2015-11-14 ENCOUNTER — Ambulatory Visit: Payer: Self-pay | Admitting: Family

## 2015-11-17 ENCOUNTER — Encounter: Payer: Self-pay | Admitting: Family Medicine

## 2015-11-17 ENCOUNTER — Ambulatory Visit (INDEPENDENT_AMBULATORY_CARE_PROVIDER_SITE_OTHER): Payer: Medicare HMO | Admitting: Family Medicine

## 2015-11-17 VITALS — BP 108/68 | HR 98 | Temp 97.9°F | Ht 62.0 in | Wt 140.2 lb

## 2015-11-17 DIAGNOSIS — L509 Urticaria, unspecified: Secondary | ICD-10-CM | POA: Diagnosis not present

## 2015-11-17 MED ORDER — PREDNISONE 20 MG PO TABS: 40.0000 mg | ORAL_TABLET | Freq: Every day | ORAL | Status: DC

## 2015-11-17 MED ORDER — EPINEPHRINE 0.3 MG/0.3ML IJ SOAJ: 0.3000 mg | Freq: Once | INTRAMUSCULAR | Status: DC

## 2015-11-17 NOTE — Patient Instructions (Signed)
Nice to see you. Your hives are likely related to an allergic reaction to some unknown cause. They could be related to her anxiety as well. We will search on prednisone for this. I prescribed an EpiPen as well. He should keep follow up with your psychiatrist for your anxiety. If you develop worsening lip swelling, tongue or throat swelling, trouble breathing, spreading hives, or any new or changing symptoms please seek medical attention.

## 2015-11-17 NOTE — Progress Notes (Signed)
Patient ID: [name], female   DOB: 01-22-1946, 70 y.o.   MRN: [id number]  Tommi Rumps, MD Phone: [phone]  Pam Salazar is a 70 y.o. female who presents today for same-day visit.  Patient notes onset of hives yesterday. Notes they are on her bilateral buttocks and back. Have come around to her vaginal area as well. No pain. They do itch. No changes in her underwear. No new meds. No new foods. She does note she did change a vitamin though this was several weeks ago. Has a history of hives when she's been under stress. She notes her upper lip is a small bit swollen though no tongue, throat, or breathing issues. Benadryl nightly and this has not been helpful.  PMH: Former smoker   ROS see history of present illness  Objective  Physical Exam Filed Vitals:   11/17/15 1031  BP: 108/68  Pulse: 98  Temp: 97.9 F (36.6 C)    BP Readings from Last 3 Encounters:  11/17/15 108/68  10/20/15 100/68  08/10/15 106/64   Wt Readings from Last 3 Encounters:  11/17/15 140 lb 3.2 oz (63.594 kg)  10/20/15 140 lb (63.504 kg)  08/10/15 138 lb 6.4 oz (62.778 kg)    Physical Exam  Constitutional: She is well-developed, well-nourished, and in no distress.  HENT:  Head: Normocephalic and atraumatic.  Right Ear: External ear normal.  Left Ear: External ear normal.  Cardiovascular: Normal rate, regular rhythm and normal heart sounds.   Pulmonary/Chest: Effort normal and breath sounds normal.  Skin: She is not diaphoretic.  Bilateral buttocks with urticaria on the inferior portion, minimal urticaria anteriorly in the groin area, nontender, no warmth, no other urticaria noted, upper lip does appear mildly puffy, no tongue or throat swelling     Assessment/Plan: Please see individual problem list.  Urticaria Patient's lesions most consistent with urticaria. Mild lip swelling noted. No throat or tongue involvement. Normal lung exam. Stable vital signs. Could be related to anxiety or an  allergic reaction. We will treat with prednisone given that antihistamines have not been beneficial. We will provide with an EpiPen as well. She'll continue to monitor. She's given return precautions.    No orders of the defined types were placed in this encounter.    Meds ordered this encounter  Medications  . predniSONE (DELTASONE) 20 MG tablet    Sig: Take 2 tablets (40 mg total) by mouth daily with breakfast.    Dispense:  10 tablet    Refill:  0  . EPINEPHrine 0.3 mg/0.3 mL IJ SOAJ injection    Sig: Inject 0.3 mLs (0.3 mg total) into the muscle once.    Dispense:  1 Device    Refill:  0   Tommi Rumps, MD [place]

## 2015-11-17 NOTE — Progress Notes (Signed)
Pre visit review using our clinic review tool, if applicable. No additional management support is needed unless otherwise documented below in the visit note. 

## 2015-11-17 NOTE — Assessment & Plan Note (Addendum)
Patient's lesions most consistent with urticaria. Mild lip swelling noted. No throat or tongue involvement. Normal lung exam. Stable vital signs. Could be related to anxiety or an allergic reaction. We will treat with prednisone given that antihistamines have not been beneficial. We will provide with an EpiPen as well. She'll continue to monitor. She's given return precautions.

## 2015-11-21 ENCOUNTER — Other Ambulatory Visit: Payer: Self-pay | Admitting: Internal Medicine

## 2015-11-21 DIAGNOSIS — Z1231 Encounter for screening mammogram for malignant neoplasm of breast: Secondary | ICD-10-CM

## 2015-12-01 ENCOUNTER — Ambulatory Visit: Payer: Medicare HMO | Attending: Internal Medicine

## 2015-12-06 ENCOUNTER — Other Ambulatory Visit: Payer: Self-pay | Admitting: Internal Medicine

## 2015-12-06 ENCOUNTER — Ambulatory Visit
Admission: RE | Admit: 2015-12-06 | Discharge: 2015-12-06 | Disposition: A | Discharge status: Home / Self Care | Payer: Medicare HMO | Source: Ambulatory Visit | Attending: Internal Medicine | Admitting: Internal Medicine

## 2015-12-06 DIAGNOSIS — Z1231 Encounter for screening mammogram for malignant neoplasm of breast: Secondary | ICD-10-CM | POA: Diagnosis present

## 2015-12-08 ENCOUNTER — Encounter: Payer: Self-pay | Admitting: Internal Medicine

## 2015-12-08 ENCOUNTER — Ambulatory Visit (INDEPENDENT_AMBULATORY_CARE_PROVIDER_SITE_OTHER): Payer: Medicare HMO | Admitting: Internal Medicine

## 2015-12-08 VITALS — BP 110/70 | HR 96 | Temp 97.6°F | Resp 18 | Ht 62.0 in | Wt 143.2 lb

## 2015-12-08 DIAGNOSIS — F329 Major depressive disorder, single episode, unspecified: Secondary | ICD-10-CM

## 2015-12-08 DIAGNOSIS — E039 Hypothyroidism, unspecified: Secondary | ICD-10-CM

## 2015-12-08 DIAGNOSIS — Z01818 Encounter for other preprocedural examination: Secondary | ICD-10-CM

## 2015-12-08 DIAGNOSIS — E7800 Pure hypercholesterolemia, unspecified: Secondary | ICD-10-CM | POA: Diagnosis not present

## 2015-12-08 DIAGNOSIS — F32A Depression, unspecified: Secondary | ICD-10-CM

## 2015-12-08 LAB — LIPID PANEL
Cholesterol: 170 mg/dL (ref 0–200)
HDL: 64.6 mg/dL (ref >39.00)
LDL CALC: 77 mg/dL (ref 0–99)
NonHDL: 105.35
TRIGLYCERIDES: 142 mg/dL (ref 0.0–149.0)
Total CHOL/HDL Ratio: 3
VLDL: 28.4 mg/dL (ref 0.0–40.0)

## 2015-12-08 LAB — CBC WITH DIFFERENTIAL/PLATELET
BASOS ABS: 0 10*3/uL (ref 0.0–0.1)
Basophils Relative: 0.2 % (ref 0.0–3.0)
EOS PCT: 0 % (ref 0.0–5.0)
Eosinophils Absolute: 0 10*3/uL (ref 0.0–0.7)
HEMATOCRIT: 41.4 % (ref 36.0–46.0)
Hemoglobin: 14 g/dL (ref 12.0–15.0)
LYMPHS ABS: 1.4 10*3/uL (ref 0.7–4.0)
LYMPHS PCT: 22.9 % (ref 12.0–46.0)
MCHC: 33.8 g/dL (ref 30.0–36.0)
MCV: 84.4 fl (ref 78.0–100.0)
MONOS PCT: 11.8 % (ref 3.0–12.0)
Monocytes Absolute: 0.7 10*3/uL (ref 0.1–1.0)
NEUTROS ABS: 4 10*3/uL (ref 1.4–7.7)
NEUTROS PCT: 65.1 % (ref 43.0–77.0)
PLATELETS: 229 10*3/uL (ref 150.0–400.0)
RBC: 4.9 Mil/uL (ref 3.87–5.11)
RDW: 14.8 % (ref 11.5–15.5)
WBC: 6.2 10*3/uL (ref 4.0–10.5)

## 2015-12-08 LAB — BASIC METABOLIC PANEL WITH GFR
BUN: 24 mg/dL — ABNORMAL HIGH (ref 6–23)
CO2: 28 meq/L (ref 19–32)
Calcium: 9.6 mg/dL (ref 8.4–10.5)
Chloride: 104 meq/L (ref 96–112)
Creatinine, Ser: 0.8 mg/dL (ref 0.40–1.20)
GFR: 75.48 mL/min
Glucose, Bld: 82 mg/dL (ref 70–99)
Potassium: 4.1 meq/L (ref 3.5–5.1)
Sodium: 141 meq/L (ref 135–145)

## 2015-12-08 LAB — HEPATIC FUNCTION PANEL
ALBUMIN: 4.4 g/dL (ref 3.5–5.2)
ALK PHOS: 61 U/L (ref 39–117)
ALT: 25 U/L (ref 0–35)
AST: 27 U/L (ref 0–37)
BILIRUBIN DIRECT: 0.1 mg/dL (ref 0.0–0.3)
TOTAL PROTEIN: 6.9 g/dL (ref 6.0–8.3)
Total Bilirubin: 0.6 mg/dL (ref 0.2–1.2)

## 2015-12-08 LAB — TSH: TSH: 4.02 u[IU]/mL (ref 0.35–4.50)

## 2015-12-08 NOTE — Progress Notes (Signed)
Patient ID: Pam Salazar, female   DOB: [birth year], 70 y.o.   MRN: [id number]   Subjective:    Patient ID: Pam Salazar, female    DOB: [birth year], 70 y.o.   MRN: [id number]  HPI  Patient here for a scheduled follow up and for pre op clearance.  She is planning to have left shoulder surgery 12/25/15.  She is doing well.  Stay active.  No chest pain with increased activity or exertion.  No sob.  No acid reflux.  No abdominal pain or cramping.  Bowels stable.  She is seeing opthalmology for her eyes.  Having problems with some increased depression.   Plans to see Dr Abner Greenspan on 12/19/15.  She has been dealing with depression for years.  Does not feel like her current medication is working well for her.  No suicidal ideations.     Past Medical History  Diagnosis Date  . Hypercholesterolemia   . Hypothyroidism   . Depression   . Panic disorder     previous agoraphobia  . Vitamin D deficiency   . Low blood pressure 10/25/2014  . Vertigo     positional - rare  . Arthritis     hips, lower back  . Seizures (HCC)     febrile - as infant  . Shoulder pain, left     bursa   Past Surgical History  Procedure Laterality Date  . Abdominal hysterectomy  1981    partial - secondary to bleeding, ovaries not removed -   . Orif fibula fracture Right 2003    right lower extremitiy  . Tonsillectomy  1974  . Cataract extraction w/ intraocular lens  implant, bilateral    . Colonoscopy with propofol N/A 05/05/2015    Procedure: COLONOSCOPY WITH PROPOFOL;  Surgeon: Lucilla Lame, MD;  Location: [facility];  Service: Endoscopy;  Laterality: N/A;   Family History  Problem Relation Age of Onset  . Arthritis Mother   . Hyperlipidemia Mother   . Arthritis Father   . Prostate cancer Father   . Breast cancer Sister 67  . Heart disease Brother     heart attack  . Diabetes Brother   . Breast cancer Maternal Aunt 85  . Colon cancer Maternal Uncle    Social History   Social History  . Marital Status:  Married    Spouse Name: N/A  . Number of Children: N/A  . Years of Education: N/A   Social History Main Topics  . Smoking status: Former Smoker    Types: Cigarettes  . Smokeless tobacco: Former Systems developer    Quit date: 07/26/2013     Comment:    . Alcohol Use: No  . Drug Use: No  . Sexual Activity: Yes   Other Topics Concern  . None   Social History Narrative    Outpatient Encounter Prescriptions as of 12/08/2015  Medication Sig  . ARIPiprazole (ABILIFY) 2 MG tablet TK 1/2 T PO QHS  . beta carotene w/minerals (OCUVITE) tablet Take 1 tablet by mouth daily. AM  . BIOTIN PO Take by mouth.  . clonazePAM (KLONOPIN) 0.5 MG tablet Take 0.5 mg by mouth 2 (two) times daily.  Marland Kitchen EPINEPHrine 0.3 mg/0.3 mL IJ SOAJ injection Inject 0.3 mLs (0.3 mg total) into the muscle once.  Marland Kitchen levothyroxine (SYNTHROID, LEVOTHROID) 100 MCG tablet TAKE 1 TABLET BY MOUTH EVERY DAY BEFORE BREAKFAST  . simvastatin (ZOCOR) 20 MG tablet TAKE 1 TABLET  EVERY EVENING  . venlafaxine XR (EFFEXOR-XR) 150  MG 24 hr capsule TAKE 1 CAPSULE EVERY DAY  . [DISCONTINUED] Cholecalciferol (VITAMIN D PO) Take by mouth.  . [DISCONTINUED] methocarbamol (ROBAXIN) 500 MG tablet Take 1 tablet (500 mg total) by mouth 3 (three) times daily.  . [DISCONTINUED] predniSONE (DELTASONE) 20 MG tablet Take 2 tablets (40 mg total) by mouth daily with breakfast.   No facility-administered encounter medications on file as of 12/08/2015.    Review of Systems  Constitutional: Negative for appetite change and unexpected weight change.  HENT: Negative for congestion and sinus pressure.   Respiratory: Negative for cough, chest tightness and shortness of breath.   Cardiovascular: Negative for chest pain, palpitations and leg swelling.  Gastrointestinal: Negative for nausea, vomiting, abdominal pain and diarrhea.  Genitourinary: Negative for dysuria and difficulty urinating.  Musculoskeletal: Negative for myalgias.       Planning for left shoulder  surgery 12/25/15.   Skin: Negative for color change and rash.  Neurological: Negative for dizziness, light-headedness and headaches.  Psychiatric/Behavioral: Negative for agitation.       Problems with depression.        Objective:    Physical Exam  Constitutional: She appears well-developed and well-nourished. No distress.  HENT:  Nose: Nose normal.  Mouth/Throat: Oropharynx is clear and moist.  Neck: Neck supple. No thyromegaly present.  Cardiovascular: Normal rate and regular rhythm.   Pulmonary/Chest: Breath sounds normal. No respiratory distress. She has no wheezes.  Abdominal: Soft. Bowel sounds are normal. There is no tenderness.  Musculoskeletal: She exhibits no edema or tenderness.  Lymphadenopathy:    She has no cervical adenopathy.  Skin: No rash noted. No erythema.  Psychiatric: She has a normal mood and affect. Her behavior is normal.    BP 110/70 mmHg  Pulse 96  Temp(Src) 97.6 F (36.4 C) (Oral)  Resp 18  Ht 5\' 2"  (1.575 m)  Wt 143 lb 4 oz (64.978 kg)  BMI 26.19 kg/m2  SpO2 94% Wt Readings from Last 3 Encounters:  12/08/15 143 lb 4 oz (64.978 kg)  11/17/15 140 lb 3.2 oz (63.594 kg)  10/20/15 140 lb (63.504 kg)     Lab Results  Component Value Date   WBC 6.2 12/08/2015   HGB 14.0 12/08/2015   HCT 41.4 12/08/2015   PLT 229.0 12/08/2015   GLUCOSE 82 12/08/2015   CHOL 170 12/08/2015   TRIG 142.0 12/08/2015   HDL 64.60 12/08/2015   LDLCALC 77 12/08/2015   ALT 25 12/08/2015   AST 27 12/08/2015   NA 141 12/08/2015   K 4.1 12/08/2015   CL 104 12/08/2015   CREATININE 0.80 12/08/2015   BUN 24* 12/08/2015   CO2 28 12/08/2015   TSH 4.02 12/08/2015    Mm Screening Breast Tomo Bilateral  12/06/2015  CLINICAL DATA:  Screening. EXAM: 2D DIGITAL SCREENING BILATERAL MAMMOGRAM WITH CAD AND ADJUNCT TOMO COMPARISON:  Previous exam(s). ACR Breast Density Category b: There are scattered areas of fibroglandular density. FINDINGS: There are no findings suspicious  for malignancy. Images were processed with CAD. IMPRESSION: No mammographic evidence of malignancy. A result letter of this screening mammogram will be mailed directly to the patient. RECOMMENDATION: Screening mammogram in one year. (Code:SM-B-01Y) BI-RADS CATEGORY  1: Negative. Electronically Signed   By: Franki Cabot M.D.   On: 12/06/2015 16:17       Assessment & Plan:   Problem List Items Addressed This Visit    Depression    Has been seeing psychiatry.  Does not feel current medication working well  for her.  Some increased depression.  Discussed with her today.  No suicidal ideations.  Planning to see a new psychiatrist (Dr Abner Greenspan) - 12/19/15.        Relevant Orders   CBC with Differential/Platelet (Completed)   Hypercholesterolemia    On simvastatin.  Low cholesterol diet and exercise.  Follow lipid panel and liver function tests.       Relevant Orders   Lipid panel (Completed)   Hepatic function panel (Completed)   Basic metabolic panel (Completed)   Hypothyroidism    On thyroid replacement.  Recheck tsh.       Relevant Orders   TSH (Completed)   Pre-op evaluation    She is active with no cardiac symptoms with increased activity or exertion.  EKG obtained and revealed SR with no acute ischemic changes.  I feel from a cardiac stand point that she is at low risk to proceed with the planned surgery.  She will need close intra op and post op monitoring of her heart rate and blood pressure to avoid extremes.         Other Visit Diagnoses    Pre-operative clearance    -  Primary    Relevant Orders    EKG 12-Lead (Completed)      I spent 25 minutes with the patient and more than 50% of the time was spent in consultation regarding the above.     Einar Pheasant, MD

## 2015-12-08 NOTE — Progress Notes (Signed)
Pre-visit discussion using our clinic review tool. No additional management support is needed unless otherwise documented below in the visit note.  

## 2015-12-09 ENCOUNTER — Encounter: Payer: Self-pay | Admitting: Internal Medicine

## 2015-12-14 ENCOUNTER — Encounter: Payer: Self-pay | Admitting: Internal Medicine

## 2015-12-14 NOTE — Assessment & Plan Note (Signed)
On thyroid replacement.  Recheck tsh.

## 2015-12-14 NOTE — Assessment & Plan Note (Signed)
On simvastatin.  Low cholesterol diet and exercise.  Follow lipid panel and liver function tests.   

## 2015-12-14 NOTE — Assessment & Plan Note (Signed)
She is active with no cardiac symptoms with increased activity or exertion.  EKG obtained and revealed SR with no acute ischemic changes.  I feel from a cardiac stand point that she is at low risk to proceed with the planned surgery.  She will need close intra op and post op monitoring of her heart rate and blood pressure to avoid extremes.

## 2015-12-14 NOTE — Assessment & Plan Note (Signed)
Has been seeing psychiatry.  Does not feel current medication working well for her.  Some increased depression.  Discussed with her today.  No suicidal ideations.  Planning to see a new psychiatrist (Dr Abner Greenspan) - 12/19/15.

## 2015-12-21 NOTE — Telephone Encounter (Signed)
Unread mychart message mailed to patient 

## 2015-12-23 HISTORY — PX: SHOULDER SURGERY: SHX246

## 2015-12-29 ENCOUNTER — Emergency Department
Admission: EM | Admit: 2015-12-29 | Discharge: 2015-12-29 | Disposition: A | Discharge status: Home / Self Care | Payer: Medicare HMO | Attending: Emergency Medicine | Admitting: Emergency Medicine

## 2015-12-29 ENCOUNTER — Telehealth: Payer: Self-pay | Admitting: *Deleted

## 2015-12-29 ENCOUNTER — Emergency Department: Payer: Medicare HMO

## 2015-12-29 ENCOUNTER — Encounter: Payer: Self-pay | Admitting: Emergency Medicine

## 2015-12-29 DIAGNOSIS — K5901 Slow transit constipation: Secondary | ICD-10-CM | POA: Insufficient documentation

## 2015-12-29 DIAGNOSIS — M1990 Unspecified osteoarthritis, unspecified site: Secondary | ICD-10-CM | POA: Insufficient documentation

## 2015-12-29 DIAGNOSIS — Z8669 Personal history of other diseases of the nervous system and sense organs: Secondary | ICD-10-CM | POA: Diagnosis not present

## 2015-12-29 DIAGNOSIS — E039 Hypothyroidism, unspecified: Secondary | ICD-10-CM | POA: Diagnosis not present

## 2015-12-29 DIAGNOSIS — Z79899 Other long term (current) drug therapy: Secondary | ICD-10-CM | POA: Insufficient documentation

## 2015-12-29 DIAGNOSIS — Z87891 Personal history of nicotine dependence: Secondary | ICD-10-CM | POA: Insufficient documentation

## 2015-12-29 DIAGNOSIS — K5900 Constipation, unspecified: Secondary | ICD-10-CM | POA: Diagnosis present

## 2015-12-29 MED ORDER — MAGNESIUM CITRATE PO SOLN
ORAL | Status: AC
  Filled 2015-12-29: qty 296

## 2015-12-29 MED ORDER — ONDANSETRON 4 MG PO TBDP
4.0000 mg | ORAL_TABLET | Freq: Once | ORAL | Status: AC
  Administered 2015-12-29: 4 mg via ORAL

## 2015-12-29 MED ORDER — MAGNESIUM CITRATE PO SOLN
1.0000 | Freq: Once | ORAL | Status: AC
  Administered 2015-12-29: 1 via ORAL

## 2015-12-29 MED ORDER — MINERAL OIL RE ENEM
1.0000 | ENEMA | Freq: Once | RECTAL | Status: AC
  Administered 2015-12-29: 1 via RECTAL

## 2015-12-29 MED ORDER — ONDANSETRON 4 MG PO TBDP
ORAL_TABLET | ORAL | Status: AC
  Administered 2015-12-29: 4 mg via ORAL
  Filled 2015-12-29: qty 1
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
## 2015-12-29 NOTE — ED Notes (Signed)
Pt back from [facility]

## 2015-12-29 NOTE — ED Notes (Signed)
Pt up to bedside commode

## 2015-12-29 NOTE — Telephone Encounter (Signed)
Patient has constipation, she has not had a bowel movement since 12/24/15. She had surgery on 12/25/15.  She would like a phone consults to help her resolve her issue. Pt contact [phone]

## 2015-12-29 NOTE — ED Notes (Signed)
Pt only able to pass gas and 300 mL urine

## 2015-12-29 NOTE — ED Notes (Signed)
Reviewed d/c instructions, magnesium citrate per MD Kinner order, and high fiber diet with pt. Pt verbalized understanding

## 2015-12-29 NOTE — ED Notes (Signed)
Pt drinking magnesium citrate. Pt will alert RN when done with medication bottle so RN can perform enema

## 2015-12-29 NOTE — ED Notes (Addendum)
Pt states she had shoulder surgery on Monday. Last BM was on Sunday before surgery.  Pt states she has been on Percocet for the pain and has not taken any today.  She has tried several OTC preps without results.  Pt states she has had some gas after using a suppository.  Pt feels like the stool is high up.  Denies abdominal pain.

## 2015-12-29 NOTE — Telephone Encounter (Signed)
Patient was advised to go to [facility] or [facility] urgent [facility].

## 2015-12-29 NOTE — ED Notes (Signed)
Patient presents to ER with constipation since July 2nd. Patient had shoulder surgery on July 3rd and has been taking percocet for the pain. She is passing gas from results of a suppository. Has also tried OTC meds with no results. Denies abdominal pain.

## 2015-12-29 NOTE — ED Notes (Signed)
Pt passed mineral oil, with scant amount of soft stool. MD Corky Downs notified

## 2015-12-29 NOTE — Discharge Instructions (Signed)
Constipation, Adult °Constipation is when a person has fewer than three bowel movements a week, has difficulty having a bowel movement, or has stools that are dry, hard, or larger than normal. As people grow older, constipation is more common. A low-fiber diet, not taking in enough fluids, and taking certain medicines may make constipation worse.  °CAUSES  °· Certain medicines, such as antidepressants, pain medicine, iron supplements, antacids, and water pills.   °· Certain diseases, such as diabetes, irritable bowel syndrome (IBS), thyroid disease, or depression.   °· Not drinking enough water.   °· Not eating enough fiber-rich foods.   °· Stress or travel.   °· Lack of physical activity or exercise.   °· Ignoring the urge to have a bowel movement.   °· Using laxatives too much.   °SIGNS AND SYMPTOMS  °· Having fewer than three bowel movements a week.   °· Straining to have a bowel movement.   °· Having stools that are hard, dry, or larger than normal.   °· Feeling full or bloated.   °· Pain in the lower abdomen.   °· Not feeling relief after having a bowel movement.   °DIAGNOSIS  °Your health care provider will take a medical history and perform a physical exam. Further testing may be done for severe constipation. Some tests may include: °· A barium enema X-ray to examine your rectum, colon, and, sometimes, your small intestine.   °· A sigmoidoscopy to examine your lower colon.   °· A colonoscopy to examine your entire colon. °TREATMENT  °Treatment will depend on the severity of your constipation and what is causing it. Some dietary treatments include drinking more fluids and eating more fiber-rich foods. Lifestyle treatments may include regular exercise. If these diet and lifestyle recommendations do not help, your health care provider may recommend taking over-the-counter laxative medicines to help you have bowel movements. Prescription medicines may be prescribed if over-the-counter medicines do not work.    °HOME CARE INSTRUCTIONS  °· Eat foods that have a lot of fiber, such as fruits, vegetables, whole grains, and beans. °· Limit foods high in fat and processed sugars, such as french fries, hamburgers, cookies, candies, and soda.   °· A fiber supplement may be added to your diet if you cannot get enough fiber from foods.   °· Drink enough fluids to keep your urine clear or pale yellow.   °· Exercise regularly or as directed by your health care provider.   °· Go to the restroom when you have the urge to go. Do not hold it.   °· Only take over-the-counter or prescription medicines as directed by your health care provider. Do not take other medicines for constipation without talking to your health care provider first.   °SEEK IMMEDIATE MEDICAL CARE IF:  °· You have bright red blood in your stool.   °· Your constipation lasts for more than 4 days or gets worse.   °· You have abdominal or rectal pain.   °· You have thin, pencil-like stools.   °· You have unexplained weight loss. °MAKE SURE YOU:  °· Understand these instructions. °· Will watch your condition. °· Will get help right away if you are not doing well or get worse. °  °This information is not intended to replace advice given to you by your health care provider. Make sure you discuss any questions you have with your health care provider. °  °Document Released: 03/08/2004 Document Revised: 07/01/2014 Document Reviewed: 03/22/2013 °Elsevier Interactive Patient Education ©2016 [employer] ° °High-Fiber Diet °Fiber, also called dietary fiber, is a type of carbohydrate found in fruits, vegetables, whole grains, and   beans. A high-fiber diet can have many health benefits. Your health care provider may recommend a high-fiber diet to help: °· Prevent constipation. Fiber can make your bowel movements more regular. °· Lower your cholesterol. °· Relieve hemorrhoids, uncomplicated diverticulosis, or irritable bowel syndrome. °· Prevent overeating as part of a weight-loss  plan. °· Prevent heart disease, type 2 diabetes, and certain cancers. °WHAT IS MY PLAN? °The recommended daily intake of fiber includes: °· 38 grams for men under age 50. °· 30 grams for men over age 50. °· 25 grams for women under age 50. °· 21 grams for women over age 50. °You can get the recommended daily intake of dietary fiber by eating a variety of fruits, vegetables, grains, and beans. Your health care provider may also recommend a fiber supplement if it is not possible to get enough fiber through your diet. °WHAT DO I NEED TO KNOW ABOUT A HIGH-FIBER DIET? °· Fiber supplements have not been widely studied for their effectiveness, so it is better to get fiber through food sources. °· Always check the fiber content on the nutrition facts label of any prepackaged food. Look for foods that contain at least 5 grams of fiber per serving. °· Ask your dietitian if you have questions about specific foods that are related to your condition, especially if those foods are not listed in the following section. °· Increase your daily fiber consumption gradually. Increasing your intake of dietary fiber too quickly may cause bloating, cramping, or gas. °· Drink plenty of water. Water helps you to digest fiber. °WHAT FOODS CAN I EAT? °Grains °Whole-grain breads. Multigrain cereal. Oats and oatmeal. Brown rice. Barley. Bulgur wheat. Millet. Bran muffins. Popcorn. Rye wafer crackers. °Vegetables °Sweet potatoes. Spinach. Kale. Artichokes. Cabbage. Broccoli. Green peas. Carrots. Squash. °Fruits °Berries. Pears. Apples. Oranges. Avocados. Prunes and raisins. Dried figs. °Meats and Other Protein Sources °Navy, kidney, pinto, and soy beans. Split peas. Lentils. Nuts and seeds. °Dairy °Fiber-fortified yogurt. °Beverages °Fiber-fortified soy milk. Fiber-fortified orange juice. °Other °Fiber bars. °The items listed above may not be a complete list of recommended foods or beverages. Contact your dietitian for more options. °WHAT FOODS  ARE NOT RECOMMENDED? °Grains °White bread. Pasta made with refined flour. White rice. °Vegetables °Fried potatoes. Canned vegetables. Well-cooked vegetables.  °Fruits °Fruit juice. Cooked, strained fruit. °Meats and Other Protein Sources °Fatty cuts of meat. Fried poultry or fried fish. °Dairy °Milk. Yogurt. Cream cheese. Sour cream. °Beverages °Soft drinks. °Other °Cakes and pastries. Butter and oils. °The items listed above may not be a complete list of foods and beverages to avoid. Contact your dietitian for more information. °WHAT ARE SOME TIPS FOR INCLUDING HIGH-FIBER FOODS IN MY DIET? °· Eat a wide variety of high-fiber foods. °· Make sure that half of all grains consumed each day are whole grains. °· Replace breads and cereals made from refined flour or white flour with whole-grain breads and cereals. °· Replace white rice with brown rice, bulgur wheat, or millet. °· Start the day with a breakfast that is high in fiber, such as a cereal that contains at least 5 grams of fiber per serving. °· Use beans in place of meat in soups, salads, or pasta. °· Eat high-fiber snacks, such as berries, raw vegetables, nuts, or popcorn. °  °This information is not intended to replace advice given to you by your health care provider. Make sure you discuss any questions you have with your health care provider. °  °Document Released: 06/10/2005 Document Revised: 07/01/2014 Document   Reviewed: 11/23/2013 °Elsevier Interactive Patient Education ©2016 Elsevier Inc. ° °

## 2015-12-29 NOTE — Telephone Encounter (Signed)
Patient has not had a Bowel movement since Sunday 12/24/15. She had surgery on 12/25/15 when she was prescribed percocet. She started with 3 tablets for the whole day on Tuesday. Wednesday he started taking 1/2 of tablet every 7 hours.   Patient has tried sena S, miralax, fleets suppository ( was left in for 15 minutes), and along with one other suppository. She was unsuccessful with both suppositories she stated that both came back clear or just clear water. Patient is nervous she is compacted and seemed like she wanted to check for that.   Patient was advised she could go to a urgent care or Saturday clinic but wanted to hear from you first before proceeding.

## 2015-12-29 NOTE — ED Notes (Signed)
MD Kinner at bedside  

## 2015-12-29 NOTE — Telephone Encounter (Signed)
What has she tried.  Is she on pain medication?  If no bowel movement for five days, probably needs a suppository or enema - to get things started.  If enema and no results, may repeat x 1.  Once bowel movement, would start miralax daily.  Let us know if persistent problems.

## 2015-12-29 NOTE — Telephone Encounter (Signed)
If she has tried miralax, enema, suppository, etc and no bowel movement and no stool - then needs to be evaluated.

## 2015-12-29 NOTE — Telephone Encounter (Signed)
What would you recommend this patient use to help with her constipation.

## 2015-12-29 NOTE — ED Notes (Signed)
Pt reports nausea and scant emesis. MD Corky Downs informed

## 2015-12-29 NOTE — ED Notes (Signed)
Bedside commode placed at pt bedside. Pt will hit call bell when ready to transfer to commode

## 2015-12-29 NOTE — ED Notes (Signed)
Pt placed on bedside commode. Pt given call light. Pt will alert RN when ready to be transferred back to bed.

## 2016-01-01 ENCOUNTER — Encounter: Payer: Self-pay | Admitting: Internal Medicine

## 2016-02-13 ENCOUNTER — Ambulatory Visit: Payer: Self-pay | Admitting: Family

## 2016-02-27 ENCOUNTER — Encounter: Payer: Self-pay | Admitting: Internal Medicine

## 2016-02-28 MED ORDER — SERTRALINE HCL 50 MG PO TABS: 50.0000 mg | ORAL_TABLET | Freq: Every day | ORAL | 2 refills | Status: DC

## 2016-02-28 MED ORDER — CLONAZEPAM 1 MG PO TABS: 1.0000 mg | ORAL_TABLET | Freq: Two times a day (BID) | ORAL | 1 refills | Status: DC | PRN

## 2016-02-28 NOTE — Telephone Encounter (Signed)
[facility] for sertraline 50mg  #30 with 2 refills and clonazepam #60 with one refill.  Clonazepam - in box.

## 2016-02-28 NOTE — Telephone Encounter (Signed)
Rx faxed to [facility]

## 2016-04-30 ENCOUNTER — Encounter: Payer: Self-pay | Admitting: Internal Medicine

## 2016-04-30 ENCOUNTER — Ambulatory Visit (INDEPENDENT_AMBULATORY_CARE_PROVIDER_SITE_OTHER): Payer: Medicare HMO | Admitting: Internal Medicine

## 2016-04-30 VITALS — BP 100/60 | HR 86 | Temp 97.7°F | Ht 62.0 in | Wt 138.8 lb

## 2016-04-30 DIAGNOSIS — G4700 Insomnia, unspecified: Secondary | ICD-10-CM

## 2016-04-30 DIAGNOSIS — Z860100 Personal history of colon polyps, unspecified: Secondary | ICD-10-CM

## 2016-04-30 DIAGNOSIS — F329 Major depressive disorder, single episode, unspecified: Secondary | ICD-10-CM

## 2016-04-30 DIAGNOSIS — F32A Depression, unspecified: Secondary | ICD-10-CM

## 2016-04-30 DIAGNOSIS — Z0000 Encounter for general adult medical examination without abnormal findings: Secondary | ICD-10-CM

## 2016-04-30 DIAGNOSIS — E7800 Pure hypercholesterolemia, unspecified: Secondary | ICD-10-CM

## 2016-04-30 DIAGNOSIS — E039 Hypothyroidism, unspecified: Secondary | ICD-10-CM | POA: Diagnosis not present

## 2016-04-30 NOTE — Progress Notes (Addendum)
Patient ID: Pam Salazar, female   DOB: 16-Mar-1946, 70 y.o.   MRN: [id number]   Subjective:    Patient ID: Pam Salazar, female    DOB: November 24, 1945, 70 y.o.   MRN: [id number]  HPI  Patient here for her physical exam.  She reports overall feeling better since coming off some of her medication (i.e., abilify).  She does report problems with not being able to sleep.  Discussed with her today.  She is taking zoloft and clonazepam.  Has been taking tylenol pm and one of her husband's gabapentin intermittently to help her sleep.  This has helped.  We discussed not taking her husband's medication.  Discussed contacting her psychiatrist regarding sleep issues and seeing what they recommended.  She is in agreement.  Stays active.  No chest pain.  No sob.  Eating and drinking.     Past Medical History:  Diagnosis Date  . Arthritis    hips, lower back  . Depression   . Hypercholesterolemia   . Hypothyroidism   . Low blood pressure 10/25/2014  . Panic disorder    previous agoraphobia  . Seizures (HCC)    febrile - as infant  . Shoulder pain, left    bursa  . Vertigo    positional - rare  . Vitamin D deficiency    Past Surgical History:  Procedure Laterality Date  . ABDOMINAL HYSTERECTOMY  1981   partial - secondary to bleeding, ovaries not removed -   . CATARACT EXTRACTION W/ INTRAOCULAR LENS  IMPLANT, BILATERAL    . COLONOSCOPY WITH PROPOFOL N/A 05/05/2015   Procedure: COLONOSCOPY WITH PROPOFOL;  Surgeon: Lucilla Lame, MD;  Location: [facility];  Service: Endoscopy;  Laterality: N/A;  . ORIF FIBULA FRACTURE Right 2003   right lower extremitiy  . TONSILLECTOMY  1974   Family History  Problem Relation Age of Onset  . Arthritis Mother   . Hyperlipidemia Mother   . Arthritis Father   . Prostate cancer Father   . Breast cancer Sister 59  . Heart disease Brother     heart attack  . Diabetes Brother   . Breast cancer Maternal Aunt 85  . Colon cancer Maternal Uncle    Social  History   Social History  . Marital status: Married    Spouse name: N/A  . Number of children: N/A  . Years of education: N/A   Social History Main Topics  . Smoking status: Former Smoker    Types: Cigarettes  . Smokeless tobacco: Former Systems developer    Quit date: 07/26/2013     Comment:    . Alcohol use No  . Drug use: No  . Sexual activity: Yes   Other Topics Concern  . None   Social History Narrative  . None    Outpatient Encounter Prescriptions as of 04/30/2016  Medication Sig  . BIOTIN PO Take by mouth.  . EPINEPHrine 0.3 mg/0.3 mL IJ SOAJ injection Inject 0.3 mLs (0.3 mg total) into the muscle once.  Marland Kitchen levothyroxine (SYNTHROID, LEVOTHROID) 100 MCG tablet TAKE 1 TABLET BY MOUTH EVERY DAY BEFORE BREAKFAST  . Multiple Vitamins-Minerals (ICAPS AREDS 2 PO) Take by mouth.  . sertraline (ZOLOFT) 50 MG tablet Take 1 tablet (50 mg total) by mouth daily.  . simvastatin (ZOCOR) 20 MG tablet TAKE 1 TABLET  EVERY EVENING  . [DISCONTINUED] beta carotene w/minerals (OCUVITE) tablet Take 1 tablet by mouth daily. AM  . [DISCONTINUED] clonazePAM (KLONOPIN) 1 MG tablet Take 1 tablet (  1 mg total) by mouth 2 (two) times daily as needed for anxiety.  . [DISCONTINUED] ARIPiprazole (ABILIFY) 2 MG tablet TK 1/2 T PO QHS   No facility-administered encounter medications on file as of 04/30/2016.   '  Review of Systems  Constitutional: Negative for appetite change and unexpected weight change.  HENT: Negative for congestion and sinus pressure.   Respiratory: Negative for cough, chest tightness and shortness of breath.   Cardiovascular: Negative for chest pain, palpitations and leg swelling.  Gastrointestinal: Negative for abdominal pain, diarrhea, nausea and vomiting.  Musculoskeletal: Negative for back pain and myalgias.  Skin: Negative for color change and rash.  Neurological: Negative for dizziness, light-headedness and headaches.  Psychiatric/Behavioral: Positive for sleep disturbance. Negative  for agitation and dysphoric mood.       Objective:    Physical Exam  Constitutional: She is oriented to person, place, and time. She appears well-developed and well-nourished. No distress.  HENT:  Nose: Nose normal.  Mouth/Throat: Oropharynx is clear and moist.  Eyes: Right eye exhibits no discharge. Left eye exhibits no discharge. No scleral icterus.  Neck: Neck supple. No thyromegaly present.  Cardiovascular: Normal rate and regular rhythm.   Pulmonary/Chest: Breath sounds normal. No accessory muscle usage. No tachypnea. No respiratory distress. She has no decreased breath sounds. She has no wheezes. She has no rhonchi. Right breast exhibits no inverted nipple, no mass, no nipple discharge and no tenderness (no axillary adenopathy). Left breast exhibits no inverted nipple, no mass, no nipple discharge and no tenderness (no axilarry adenopathy).  Abdominal: Soft. Bowel sounds are normal. There is no tenderness.  Musculoskeletal: She exhibits no edema or tenderness.  Lymphadenopathy:    She has no cervical adenopathy.  Neurological: She is alert and oriented to person, place, and time.  Skin: Skin is warm. No rash noted. No erythema.  Psychiatric: She has a normal mood and affect. Her behavior is normal.    BP 100/60   Pulse 86   Temp 97.7 F (36.5 C) (Oral)   Ht 5\' 2"  (1.575 m)   Wt 138 lb 12.8 oz (63 kg)   SpO2 97%   BMI 25.39 kg/m  Wt Readings from Last 3 Encounters:  04/30/16 138 lb 12.8 oz (63 kg)  12/29/15 140 lb (63.5 kg)  12/08/15 143 lb 4 oz (65 kg)     Lab Results  Component Value Date   WBC 6.2 12/08/2015   HGB 14.0 12/08/2015   HCT 41.4 12/08/2015   PLT 229.0 12/08/2015   GLUCOSE 89 05/03/2016   CHOL 166 05/03/2016   TRIG 122.0 05/03/2016   HDL 59.50 05/03/2016   LDLCALC 82 05/03/2016   ALT 15 05/03/2016   AST 23 05/03/2016   NA 142 05/03/2016   K 3.9 05/03/2016   CL 106 05/03/2016   CREATININE 0.74 05/03/2016   BUN 16 05/03/2016   CO2 27  05/03/2016   TSH 0.24 (L) 05/03/2016    Dg Abd 1 View  Result Date: 12/29/2015 CLINICAL DATA:  Constipation EXAM: ABDOMEN - 1 VIEW COMPARISON:  CT abdomen and pelvis March 16, 2015 FINDINGS: There is diffuse stool throughout the colon. Portions of the colon are mildly distended with stool. There is no bowel dilatation or air-fluid level suggesting obstruction. No free air. No abnormal calcifications. IMPRESSION: Diffuse stool throughout colon with evidence of constipation. No bowel obstruction or free air evident on this supine examination. Electronically Signed   By: Lowella Grip III M.D.   On: 12/29/2015 19:16  Assessment & Plan:   Problem List Items Addressed This Visit    Depression    Discussed with her today.  Doing better overall.  Follow.  F/u with psychiatry as outlined for sleep issues.        Health care maintenance    Physical today 04/30/16.  mammoram 12/06/15 - Birads I.  Colonoscopy 05/05/15 as outlined.       History of colonic polyps    Colonoscopy 05/05/15 - no polyps.  Angiodysplastic lesions.        Hypercholesterolemia    Low cholesterol diet and exercise.  Follow lipid panel and liver function tests.  On simvastatin.        Hypothyroidism    On thyroid replacement.  Follow tsh.       Insomnia    Problems with not sleeping.  She is on clonazepam and zoloft.  Discussed her contacting her psychiatrist for further evaluation treatment recommendations.  She is in agreement.            Einar Pheasant, MD

## 2016-04-30 NOTE — Progress Notes (Signed)
Pre visit review using our clinic review tool, if applicable. No additional management support is needed unless otherwise documented below in the visit note. 

## 2016-05-01 ENCOUNTER — Telehealth: Payer: Self-pay | Admitting: *Deleted

## 2016-05-01 DIAGNOSIS — E7800 Pure hypercholesterolemia, unspecified: Secondary | ICD-10-CM

## 2016-05-01 DIAGNOSIS — E039 Hypothyroidism, unspecified: Secondary | ICD-10-CM

## 2016-05-01 NOTE — Telephone Encounter (Signed)
Pt coming in tomorrow (05/02/16) for labs. Need future orders placed.

## 2016-05-01 NOTE — Telephone Encounter (Signed)
Order placed for f/u labs.  

## 2016-05-01 NOTE — Telephone Encounter (Signed)
Pt was advised to call the behavior health to have her trazodone prescribed for insomnia, however pt stated that  Dr. Nicki Reaper will  have discuss this matter the pharmacist. [phone] option 3  Pt contact [phone]

## 2016-05-01 NOTE — Telephone Encounter (Signed)
Patient states she was informed by a billing person that we would have to call the pharmacy at the psychiatric office to give the ok for trazadone, she was also informed that it is usually not an issue for the pcp to prescribe trazadone. Informed patient once they see psychiatry medicines such as trazadone is controlled by them. Informed her to try to reach a clinical person instead of a billing person

## 2016-05-01 NOTE — Telephone Encounter (Signed)
I had told pt that if she was having problems with sleeping, then I wanted her to f/u with psychiatry.  I wanted them to evaluate her and see what they felt she needed.  I was concerned about which medication to use given her other medications.  Please see me before calling pt.

## 2016-05-02 ENCOUNTER — Encounter: Payer: Self-pay | Admitting: Internal Medicine

## 2016-05-02 ENCOUNTER — Other Ambulatory Visit: Payer: Self-pay

## 2016-05-02 ENCOUNTER — Telehealth: Payer: Self-pay | Admitting: *Deleted

## 2016-05-02 NOTE — Telephone Encounter (Signed)
Pt requested call in reference to being prescribed trazodone. Pt spoke to someone, at behavior health , that stated the Rx for trazodone can be prescribed by Dr. Nicki Reaper . Pt will have this suggestion faxed to this office.  Pt contact  [phone]

## 2016-05-02 NOTE — Telephone Encounter (Signed)
See other message

## 2016-05-03 ENCOUNTER — Encounter: Payer: Self-pay | Admitting: Internal Medicine

## 2016-05-03 ENCOUNTER — Other Ambulatory Visit (INDEPENDENT_AMBULATORY_CARE_PROVIDER_SITE_OTHER): Payer: Medicare HMO

## 2016-05-03 DIAGNOSIS — E7800 Pure hypercholesterolemia, unspecified: Secondary | ICD-10-CM

## 2016-05-03 DIAGNOSIS — E039 Hypothyroidism, unspecified: Secondary | ICD-10-CM | POA: Diagnosis not present

## 2016-05-03 LAB — BASIC METABOLIC PANEL
BUN: 16 mg/dL (ref 6–23)
CHLORIDE: 106 meq/L (ref 96–112)
CO2: 27 meq/L (ref 19–32)
Calcium: 9.8 mg/dL (ref 8.4–10.5)
Creatinine, Ser: 0.74 mg/dL (ref 0.40–1.20)
GFR: 82.49 mL/min (ref >60.00)
GLUCOSE: 89 mg/dL (ref 70–99)
POTASSIUM: 3.9 meq/L (ref 3.5–5.1)
Sodium: 142 mEq/L (ref 135–145)

## 2016-05-03 LAB — HEPATIC FUNCTION PANEL
ALBUMIN: 4.4 g/dL (ref 3.5–5.2)
ALT: 15 U/L (ref 0–35)
AST: 23 U/L (ref 0–37)
Alkaline Phosphatase: 61 U/L (ref 39–117)
BILIRUBIN TOTAL: 0.7 mg/dL (ref 0.2–1.2)
Bilirubin, Direct: 0.2 mg/dL (ref 0.0–0.3)
Total Protein: 7.1 g/dL (ref 6.0–8.3)

## 2016-05-03 LAB — TSH: TSH: 0.24 u[IU]/mL — ABNORMAL LOW (ref 0.35–4.50)

## 2016-05-03 LAB — LIPID PANEL
CHOL/HDL RATIO: 3
Cholesterol: 166 mg/dL (ref 0–200)
HDL: 59.5 mg/dL (ref >39.00)
LDL CALC: 82 mg/dL (ref 0–99)
NONHDL: 106.35
Triglycerides: 122 mg/dL (ref 0.0–149.0)
VLDL: 24.4 mg/dL (ref 0.0–40.0)

## 2016-05-03 MED ORDER — CLONAZEPAM 1 MG PO TABS: 1.0000 mg | ORAL_TABLET | Freq: Two times a day (BID) | ORAL | 1 refills | Status: DC | PRN

## 2016-05-03 NOTE — Telephone Encounter (Signed)
See my chart message.  pts psychiatrist prescribed trazodone.

## 2016-05-03 NOTE — Telephone Encounter (Signed)
See other my chart message.  pts psych ok'd trazodone.

## 2016-05-03 NOTE — Telephone Encounter (Signed)
This was taken care of per PCP, Note closed.

## 2016-05-03 NOTE — Telephone Encounter (Signed)
See her my chart message.  I ok'd her clonazepam for #60 with one refill.  Note sent to pt about refill.

## 2016-05-05 ENCOUNTER — Encounter: Payer: Self-pay | Admitting: Internal Medicine

## 2016-05-05 NOTE — Assessment & Plan Note (Signed)
On thyroid replacement.  Follow tsh.  

## 2016-05-05 NOTE — Assessment & Plan Note (Signed)
Low cholesterol diet and exercise.  Follow lipid panel and liver function tests.  On simvastatin.   

## 2016-05-05 NOTE — Assessment & Plan Note (Signed)
Colonoscopy 05/05/15 - no polyps.  Angiodysplastic lesions.

## 2016-05-05 NOTE — Assessment & Plan Note (Signed)
Problems with not sleeping.  She is on clonazepam and zoloft.  Discussed her contacting her psychiatrist for further evaluation treatment recommendations.  She is in agreement.

## 2016-05-05 NOTE — Assessment & Plan Note (Signed)
Physical today 04/30/16.  mammoram 12/06/15 - Birads I.  Colonoscopy 05/05/15 as outlined.

## 2016-05-05 NOTE — Assessment & Plan Note (Signed)
Discussed with her today.  Doing better overall.  Follow.  F/u with psychiatry as outlined for sleep issues.

## 2016-05-06 ENCOUNTER — Other Ambulatory Visit: Payer: Self-pay | Admitting: Internal Medicine

## 2016-05-06 ENCOUNTER — Telehealth: Payer: Self-pay

## 2016-05-06 ENCOUNTER — Telehealth: Payer: Self-pay | Admitting: *Deleted

## 2016-05-06 DIAGNOSIS — E039 Hypothyroidism, unspecified: Secondary | ICD-10-CM

## 2016-05-06 MED ORDER — LEVOTHYROXINE SODIUM 88 MCG PO TABS: 88.0000 ug | ORAL_TABLET | Freq: Every day | ORAL | 3 refills | Status: DC

## 2016-05-06 NOTE — Telephone Encounter (Signed)
Order placed for f/u tsh.  

## 2016-05-06 NOTE — Progress Notes (Signed)
Order placed for f/u tsh.  

## 2016-05-06 NOTE — Telephone Encounter (Signed)
-----   Message from Einar Pheasant, MD sent at 05/03/2016  5:00 PM EST ----- Please call and notify pt that her tsh is suppressed.  Confirm she is on 177mcg q day synthroid.  If so, then decrease to 22mcg q day.  Will need tsh checked in 6 weeks.  Cholesterol looks good.  Kidney function tests and liver function tests are wnl.

## 2016-05-06 NOTE — Telephone Encounter (Signed)
Patient notified of lab results and lab scheduled. Please place order for lab

## 2016-05-06 NOTE — Telephone Encounter (Signed)
Patient requested lab results Pt contact [phone]

## 2016-05-13 ENCOUNTER — Encounter: Payer: Self-pay | Admitting: Internal Medicine

## 2016-05-13 DIAGNOSIS — G479 Sleep disorder, unspecified: Secondary | ICD-10-CM

## 2016-05-14 ENCOUNTER — Other Ambulatory Visit: Payer: Self-pay | Admitting: Internal Medicine

## 2016-05-14 NOTE — Telephone Encounter (Signed)
Order placed for neurology referral.   

## 2016-05-21 ENCOUNTER — Telehealth: Payer: Self-pay

## 2016-05-21 NOTE — Telephone Encounter (Signed)
Patient was returning a phone call from [place] in sleep about making an appt. Message sent to [facility].

## 2016-06-10 ENCOUNTER — Encounter: Payer: Self-pay | Admitting: Internal Medicine

## 2016-06-11 NOTE — Telephone Encounter (Signed)
Sent pt my chart message informing would need to wait until closer to time (when due) for refill for clonazepam.

## 2016-06-14 ENCOUNTER — Other Ambulatory Visit (INDEPENDENT_AMBULATORY_CARE_PROVIDER_SITE_OTHER): Payer: Medicare HMO

## 2016-06-14 DIAGNOSIS — E039 Hypothyroidism, unspecified: Secondary | ICD-10-CM

## 2016-06-14 LAB — TSH: TSH: 2.44 u[IU]/mL (ref 0.35–4.50)

## 2016-06-14 MED ORDER — CLONAZEPAM 1 MG PO TABS: 1.0000 mg | ORAL_TABLET | Freq: Two times a day (BID) | ORAL | 0 refills | Status: DC | PRN

## 2016-06-14 NOTE — Telephone Encounter (Signed)
rx ok'd for clonazepam #60 with no refills.   

## 2016-06-14 NOTE — Addendum Note (Signed)
Addended by: Alisa Graff on: 06/14/2016 04:39 AM   Modules accepted: Orders

## 2016-06-18 ENCOUNTER — Encounter: Payer: Self-pay | Admitting: Internal Medicine

## 2016-06-21 ENCOUNTER — Other Ambulatory Visit: Payer: Self-pay | Admitting: Internal Medicine

## 2016-07-08 ENCOUNTER — Ambulatory Visit (INDEPENDENT_AMBULATORY_CARE_PROVIDER_SITE_OTHER): Payer: Medicare Other | Admitting: Neurology

## 2016-07-08 ENCOUNTER — Encounter: Payer: Self-pay | Admitting: Neurology

## 2016-07-08 VITALS — BP 102/66 | HR 83 | Resp 20 | Ht 62.0 in | Wt 140.0 lb

## 2016-07-08 DIAGNOSIS — F5105 Insomnia due to other mental disorder: Secondary | ICD-10-CM | POA: Diagnosis not present

## 2016-07-08 DIAGNOSIS — G4761 Periodic limb movement disorder: Secondary | ICD-10-CM

## 2016-07-08 DIAGNOSIS — F418 Other specified anxiety disorders: Secondary | ICD-10-CM | POA: Diagnosis not present

## 2016-07-08 DIAGNOSIS — G4730 Sleep apnea, unspecified: Secondary | ICD-10-CM | POA: Diagnosis not present

## 2016-07-08 DIAGNOSIS — R0683 Snoring: Secondary | ICD-10-CM | POA: Diagnosis not present

## 2016-07-08 NOTE — Progress Notes (Signed)
SLEEP MEDICINE CLINIC   Provider:  Larey Seat, M D  Referring Provider: Einar Pheasant, MD Primary Care Physician:  Einar Pheasant, MD  Chief Complaint  Patient presents with  . New Patient (Initial Visit)    insomnia, snoring, never had a sleep study    HPI:  Pam Salazar is a 71 y.o. female , seen here as a referral from Dr. Nicki Reaper for an evaluation of chronic insomnia   Pam Salazar reports that she had requested a referral to our sleep clinic based on the presence of decades of sleep initiation insomnia. Her problem has not been to sleep through the night but to initiate sleep. Initially she took hold the Benadryl tablet, probably 12.5 mg or thereabouts and could go to sleep with his low dose. After about a year she needed to increase the dose to a full tablet. She then tried gabapentin at only 100 mg at night along with the Tylenol PM and this seemed to have helped for a long time. Currently she is taking Dramamine along with Benadryl and started this new regimen just before the holiday season 2017. Sometimes it does not work. She has tried trazodone and trazodone alone did not help her to go to sleep earlier.  Sleep habits are as follows: The couple usually gets bed ready between 9:30 and 10 PM, and Pam Salazar is still full-time working. Pam Salazar will usually wake up at about 8 AM, 2-1/2 hours later than her husband, and gets on average 7 hours of nocturnal sleep with the help of over-the-counter sleep medication. She is keeping the marital bedroom cool, quiet and dark. She has changed her reading habits and no reads books again, as a back lit electronic reading device caused more insomnia. She has no nocturia.  She sleeps in a sleep number pillow in a sleep number bed. She reports her husband snores very loudly, and often she goes on to another bedroom. He had no apnea when tested but he sleeps easily whenever not physically active. Her husband reports that she snores and may have  apnea.   Medical history and family  history:  Panic attacks, beginning after a trial separation from her second husband. She has children from her first marriage, 4  grandchildren.  treated with clonazepam. Depression, Lester anemia, hypothyroidism, low blood pressure, as an infant she suffered from febrile seizures, has a history of positional vertigo infrequently, carries the diagnosis of vitamin D deficiency and shoulder pain, left.   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  No ETOH in 35 years. began tobacco use in nursing school. Stopped 3 years ago, no coffee, no tea.  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    Review of Systems: Out of a complete 14 system review, the patient complains of only the following symptoms, and all other reviewed systems are negative. Epworth score 0, Fatigue severity score 44  , depression score 3/15    Social History   Social History  . Marital status: Married    Spouse name: N/A  . Number of children: N/A  . Years of education: N/A   Occupational History  . Not on file.   Social History Main Topics  . Smoking status: Former Smoker    Types: Cigarettes  . Smokeless tobacco: Former Systems developer    Quit date: 07/26/2013     Comment:    . Alcohol use  No  . Drug use: No  . Sexual activity: Yes   Other Topics Concern  . Not on file   Social History Narrative  . No narrative on file    Family History  Problem Relation Age of Onset  . Arthritis Mother   . Hyperlipidemia Mother   . Arthritis Father   . Prostate cancer Father   . Breast cancer Sister 22  . Heart disease Brother     heart attack  . Diabetes Brother   . Breast cancer Maternal Aunt 85  . Colon cancer Maternal Uncle     Past Medical History:  Diagnosis Date  . Arthritis    hips, lower back  . Depression   . Hypercholesterolemia   . Hypothyroidism   . Low blood pressure 10/25/2014  .  Panic disorder    previous agoraphobia  . Seizures (HCC)    febrile - as infant  . Shoulder pain, left    bursa  . Vertigo    positional - rare  . Vitamin D deficiency     Past Surgical History:  Procedure Laterality Date  . ABDOMINAL HYSTERECTOMY  1981   partial - secondary to bleeding, ovaries not removed -   . CATARACT EXTRACTION W/ INTRAOCULAR LENS  IMPLANT, BILATERAL    . COLONOSCOPY WITH PROPOFOL N/A 05/05/2015   Procedure: COLONOSCOPY WITH PROPOFOL;  Surgeon: Lucilla Lame, MD;  Location: [place];  Service: Endoscopy;  Laterality: N/A;  . ORIF FIBULA FRACTURE Right 2003   right lower extremitiy  . TONSILLECTOMY  1974    Current Outpatient Prescriptions  Medication Sig Dispense Refill  . BIOTIN PO Take by mouth.    . [place]wellia-Glucosamine-Vit D (GLUCOSAMINE COMPLEX PO) Take by mouth.    . clonazePAM (KLONOPIN) 1 MG tablet Take 1 tablet (1 mg total) by mouth 2 (two) times daily as needed for anxiety. 60 tablet 0  . EPINEPHrine 0.3 mg/0.3 mL IJ SOAJ injection Inject 0.3 mLs (0.3 mg total) into the muscle once. 1 Device 0  . levothyroxine (SYNTHROID, LEVOTHROID) 88 MCG tablet Take 1 tablet (88 mcg total) by mouth daily. 90 tablet 3  . Multiple Vitamins-Minerals (ICAPS AREDS 2 PO) Take by mouth.    . sertraline (ZOLOFT) 50 MG tablet Take 1 tablet (50 mg total) by mouth daily. 30 tablet 2  . simvastatin (ZOCOR) 20 MG tablet TAKE 1 TABLET  EVERY EVENING 90 tablet 3  . TURMERIC PO Take by mouth.     No current facility-administered medications for this visit.     Allergies as of 07/08/2016 - Review Complete 07/08/2016  Allergen Reaction Noted  . Duloxetine hcl Other (See Comments) 04/14/2015  . Vytorin [ezetimibe-simvastatin] Other (See Comments) 04/14/2015    Vitals: BP 102/66   Pulse 83   Resp 20   Ht 5\' 2"  (1.575 m)   Wt 140 lb (63.5 kg)   BMI 25.61 kg/m  Last Weight:  Wt Readings from Last 1 Encounters:  07/08/16 140 lb (63.5 kg)   [id number] mass  index is 25.61 kg/m.     Last Height:   Ht Readings from Last 1 Encounters:  07/08/16 5\' 2"  (1.575 m)    Physical exam:  General: The patient is awake, alert and appears not in acute distress. The patient is well groomed. Head: Normocephalic, atraumatic. Neck is supple. Mallampati 2  neck circumference:14. Nasal airflow open , TMJ is  not evident . Retrognathia is not seen.  Cardiovascular:  Regular rate and rhythm , without  murmurs or carotid bruit, and without distended neck veins. Respiratory: Lungs are clear to auscultation. Skin:  Without evidence of edema, or rash Trunk: BMI is 26. The patient's posture is erect   Neurologic exam : The patient is awake and alert, oriented to place and time.   Memory subjective  described as intact.   MOCA:No flowsheet data found. MMSE: MMSE - Mini Mental State Exam 07/11/2015  Orientation to time 5  Orientation to Place 5  Registration 3  Attention/ Calculation 5  Recall 3  Language- name 2 objects 2  Language- repeat 1  Language- follow 3 step command 3  Language- read & follow direction 1  Write a sentence 1  Copy design 1  Total score 30   Attention span & concentration ability appears normal.  Speech is fluent,  without  dysarthria, dysphonia or aphasia.  Mood and affect are appropriate.  Cranial nerves: Pupils are equal and briskly reactive to light. Funduscopic exam without evidence of pallor or edema. Extraocular movements  in vertical and horizontal planes intact . There is  Nystagmus to the left . Visual fields by finger perimetry are intact.Hearing to finger rub intact. Facial sensation intact to fine touch.Facial motor strength is symmetric and tongue and uvula move midline. Shoulder shrug was symmetrical.   Motor exam:   Increased  Tone, rigor,  Some cog wheeling but  symmetric strength in all extremities. Sensory:  Fine touch, pinprick and vibration,  proprioception tested in the upper extremities was  normal. Coordination: Rapid alternating movements in the fingers/hands and Finger-to-nose maneuver  normal without evidence of ataxia, dysmetria or tremor. Gait and station: Patient walks without assistive device and is able unassisted to climb up to the exam table. Strength within normal limits. Stance is stable and normal.    Deep tendon reflexes: in the  upper and lower extremities are symmetric and intact. Babinski maneuver response is downgoing.  The patient was advised of the nature of the diagnosed sleep disorder , the treatment options and risks for general a health and wellness arising from not treating the condition.  I spent more than 45 minutes of face to face time with the patient. Greater than 50% of time was spent in counseling and coordination of care. We have discussed the diagnosis and differential and I answered the patient's questions.     Assessment:  After physical and neurologic examination, review of laboratory studies,  Personal review of imaging studies, reports of other /same  Imaging studies ,  Results of polysomnography/ neurophysiology testing and pre-existing records as far as provided in visit., my assessment is   1) chronic insomnia, for decades, sleep initiation not sleep fragmentation is the problem.  Mrs. Salvage is known to snore, loudest in AM , and has some apnea.   Her history of panic attacks is probably closer related to the insomnia thing given credit for. She has been treated with clonazepam which has prevented panic attacks, yet has not helped her to fall asleep earlier or easier. She has used over-the-counter preparations for this. I would like to screen her for sleep apnea, periodic limb movements and also for visit REM sleep. There has been no witnessed REM behavior disorder. She reports vivid dreams.     Plan:  Treatment plan and additional workup : PSG with parasomnia montage. SPLIT if AHI 20.  MD revisit after sleep study. Patient has implemented  sleep hygiene.  Some nights she is restless. Possible PLMs.   Larey Seat MD  07/08/2016   CC: Einar Pheasant, [place] [id number] [place], [place] [id number]

## 2016-07-08 NOTE — Patient Instructions (Signed)

## 2016-07-11 ENCOUNTER — Ambulatory Visit: Payer: Self-pay | Admitting: Internal Medicine

## 2016-07-17 ENCOUNTER — Ambulatory Visit (INDEPENDENT_AMBULATORY_CARE_PROVIDER_SITE_OTHER): Payer: Medicare Other | Admitting: Neurology

## 2016-07-17 ENCOUNTER — Other Ambulatory Visit: Payer: Self-pay | Admitting: Internal Medicine

## 2016-07-17 ENCOUNTER — Encounter: Payer: Self-pay | Admitting: Internal Medicine

## 2016-07-17 DIAGNOSIS — G4761 Periodic limb movement disorder: Secondary | ICD-10-CM | POA: Diagnosis not present

## 2016-07-17 DIAGNOSIS — R0683 Snoring: Secondary | ICD-10-CM

## 2016-07-17 DIAGNOSIS — F5105 Insomnia due to other mental disorder: Principal | ICD-10-CM

## 2016-07-17 DIAGNOSIS — F418 Other specified anxiety disorders: Secondary | ICD-10-CM

## 2016-07-17 DIAGNOSIS — G4730 Sleep apnea, unspecified: Secondary | ICD-10-CM

## 2016-07-17 NOTE — Telephone Encounter (Signed)
Refilled 06/14/16. Pt last seen 04/30/16. Please advise?

## 2016-07-17 NOTE — Telephone Encounter (Signed)
I do not mind refilling if needs, but please call pt and confirm what she is taking now to help her sleep.  She has seen sleep specialist and previously seeing psychiatry.  Just need to know for sure what medications she is taking now, so that not to duplicate medications.  Thanks

## 2016-07-18 ENCOUNTER — Other Ambulatory Visit: Payer: Self-pay | Admitting: Internal Medicine

## 2016-07-18 ENCOUNTER — Telehealth: Payer: Self-pay | Admitting: Internal Medicine

## 2016-07-18 NOTE — Telephone Encounter (Signed)
Pt called back returning your call. Thank you!  Call pt @ [phone]

## 2016-07-18 NOTE — Telephone Encounter (Signed)
Left message to call office

## 2016-07-19 ENCOUNTER — Other Ambulatory Visit: Payer: Self-pay | Admitting: Internal Medicine

## 2016-07-19 MED ORDER — CLONAZEPAM 1 MG PO TABS: 1.0000 mg | ORAL_TABLET | Freq: Two times a day (BID) | ORAL | 0 refills | Status: DC | PRN

## 2016-07-19 NOTE — Telephone Encounter (Signed)
Pt requested a update for her clonazepam refill. Pt sent a mychart message to have this refilled  Pt contact [place]

## 2016-07-19 NOTE — Telephone Encounter (Signed)
ok'd rx for clonazepam #60 with no refills.   

## 2016-07-19 NOTE — Telephone Encounter (Signed)
Refill request for Clonazepam, last seen [id number], last filled [id number].  Please advise.

## 2016-07-19 NOTE — Telephone Encounter (Signed)
Last filled 06/14/16 60 0rf last OV 04/30/16

## 2016-07-19 NOTE — Telephone Encounter (Signed)
Patient is on no other medication than what is in chart please advise.

## 2016-07-19 NOTE — Telephone Encounter (Signed)
I had sent someone a message to clarify what meds she is taking now and if taking anything more to help her sleep.

## 2016-07-22 ENCOUNTER — Encounter: Payer: Self-pay | Admitting: Internal Medicine

## 2016-07-23 NOTE — Procedures (Signed)
PATIENT'S NAME:  Pam Salazar, Pam Salazar DOB:      12/28/1945      MR#:    [id number]     DATE OF RECORDING: 07/17/2016 REFERRING M.D.:  Einar Pheasant, MD Study Performed:   Baseline Polysomnogram HISTORY:  Pam Salazar is a 71 y.o. female, seen here as a referral from Dr. Nicki Reaper for an evaluation of chronic insomnia. Unable to sleep through the night, but able to fall asleep at first, she reports her sleep is fragmented, has tried and failed multiple medications, including  Benadryl, gabapentin, Dramamine, Trazodone . She reports no Nocturia, but husband witnessed apneas and snoring. Anemia, depression, long time smoking history,  hypothyroidism,  panic disorder.   The patient endorsed the Epworth Sleepiness Scale at 0 points.  FSS at 44 points, depression 3/15  The patient's weight 141 pounds with a height of 62 (inches), resulting in a BMI of 26.1 kg/m2. The patient's neck circumference measured 14 inches.  CURRENT MEDICATIONS: Klonopin, Epinephrine, Synthroid, Zoloft, Zocor   PROCEDURE:  This is a multichannel digital polysomnogram utilizing the Somnostar 11.2 system.  Electrodes and sensors were applied and monitored per AASM Specifications.   EEG, EOG, Chin and Limb EMG, were sampled at 200 Hz.  ECG, Snore and Nasal Pressure, Thermal Airflow, Respiratory Effort, CPAP Flow and Pressure, Oximetry was sampled at 50 Hz. Digital video and audio were recorded.      BASELINE STUDY: Lights Out was at 22:49 and Lights On at 05:22.  Total recording time (TRT) was 394 minutes, with a total sleep time (TST) of 334 minutes.  The patient's sleep latency was 24.5 minutes.  REM latency was 135.5 minutes.  The sleep efficiency was 84.8 %.     SLEEP ARCHITECTURE: WASO (Wake after sleep onset) was 35 minutes.  There were 26 minutes in Stage N1, 185 minutes Stage N2, 88.5 minutes Stage N3 and 34.5 minutes in Stage REM.  The percentage of Stage N1 was 7.8%, Stage N2 was 55.4%, Stage N3 was 26.5% and Stage R (REM sleep) was  10.3%.   RESPIRATORY ANALYSIS:  There were 27 respiratory events:  0 apneas and 27 hypopneas with 4 respiratory event related arousals (RERAs).     The total APNEA/HYPOPNEA INDEX (AHI) was 4.9/hour and the total RESPIRATORY DISTURBANCE INDEX was 5.6 /hour.  0 events occurred in REM sleep and 54 events in NREM. The REM AHI was 0 /hour, versus a non-REM AHI of 5.4.  The patient spent 37 minutes of total sleep time in the supine position and 297 minutes in non-supine. The supine AHI was 30.8 versus a non-supine AHI of 1.6.  OXYGEN SATURATION & C02:  The Wake baseline 02 saturation was 92%, with the lowest being 86%. Time spent below 89% saturation equaled 19 minutes.   PERIODIC LIMB MOVEMENTS:  The patient had a total of 85 Periodic Limb Movements.  The Periodic Limb Movement (PLM) index was 15.3 and the PLM Arousal index was 1.4/hour.  The arousals were noted as: 35 were spontaneous, 8 were associated with PLMs, and 12 arousals were associated with respiratory events. Audio and video analysis did not show any abnormal or unusual movements, behaviors, phonations or vocalizations.   The patient took no bathroom breaks. Snoring was mild  EKG was in keeping with normal sinus rhythm (NSR). EEG with severe alpha intrusion, medication induced..   IMPRESSION:  1. Very mild Obstructive Sleep Apnea (OSA), strongly supine dependent. 2. Mild Periodic Limb Movement Disorder (PLMD)  RECOMMENDATIONS:  1. Advise to  avoid supine sleep,  CPAP is not indicated. Dental device can be used for snoring to be treated.  2. Further information regarding OSA may be obtained from [employer] (www.sleepfoundation.org) or American Sleep Apnea Association (www.sleepapnea.org). 3. A follow up appointment can be scheduled in the Sleep Clinic at [facility]. The referring provider will be notified of the results.      I certify that I have reviewed the entire raw data recording prior to the  issuance of this report in accordance with the Standards of Accreditation of the [place] [employer] of [employer] Medicine (AASM)      Larey Seat, MD  07-23-2016 Diplomat, American Board of Psychiatry and Neurology  Diplomat, American Board of [place] Director, [place] [facility] [employer]

## 2016-07-24 ENCOUNTER — Telehealth: Payer: Self-pay

## 2016-07-24 NOTE — Telephone Encounter (Signed)
I called pt to discuss sleep study results. No answer, left a message asking her to call me back. 

## 2016-07-24 NOTE — Telephone Encounter (Signed)
-----   Message from Larey Seat, MD sent at 07/23/2016  2:39 PM EST ----- IMPRESSION:  1. Very mild Obstructive Sleep Apnea (OSA), strongly supine dependent. 2. Mild Periodic Limb Movement Disorder (PLMD)  RECOMMENDATIONS:  1. Advise to avoid supine sleep,  CPAP is not indicated. Dental device can be used for snoring to be treated.  2. Further information regarding OSA may be obtained from [employer] (www.sleepfoundation.org) or American Sleep Apnea Association (www.sleepapnea.org). 3. A follow up appointment can be scheduled in the Sleep Clinic at [facility]. The referring provider will be notified of the results.      I certify that I have reviewed the entire raw data recording prior to the issuance of this report in accordance with the Standards of Accreditation of the [place] Academy of Sleep Medicine (AASM)

## 2016-07-25 NOTE — Telephone Encounter (Signed)
Patient returning nurse's phone call.  Please call

## 2016-07-25 NOTE — Telephone Encounter (Signed)
I called pt. Pt reports to me that she already saw the results in mychart. I offered pt an appt with Dr. Brett Fairy but she declined. Pt is looking for a new PCP and will let us know if she need anything further from [place]. Pt verbalized understanding of results. Pt had no questions at this time but was encouraged to call back if questions arise.

## 2016-08-15 ENCOUNTER — Other Ambulatory Visit: Payer: Self-pay | Admitting: Internal Medicine

## 2016-08-15 ENCOUNTER — Encounter: Payer: Self-pay | Admitting: Internal Medicine

## 2016-08-16 MED ORDER — CLONAZEPAM 1 MG PO TABS: 1.0000 mg | ORAL_TABLET | Freq: Two times a day (BID) | ORAL | 0 refills | Status: DC | PRN

## 2016-08-16 NOTE — Telephone Encounter (Signed)
ok'd rx for clonazepam #60 with no refills.   

## 2016-08-20 DIAGNOSIS — D485 Neoplasm of uncertain behavior of skin: Secondary | ICD-10-CM | POA: Diagnosis not present

## 2016-08-20 DIAGNOSIS — C44529 Squamous cell carcinoma of skin of other part of trunk: Secondary | ICD-10-CM | POA: Diagnosis not present

## 2016-09-09 DIAGNOSIS — H353131 Nonexudative age-related macular degeneration, bilateral, early dry stage: Secondary | ICD-10-CM | POA: Diagnosis not present

## 2016-09-09 DIAGNOSIS — C44529 Squamous cell carcinoma of skin of other part of trunk: Secondary | ICD-10-CM | POA: Diagnosis not present

## 2016-09-11 ENCOUNTER — Other Ambulatory Visit: Payer: Self-pay | Admitting: Internal Medicine

## 2016-09-12 NOTE — Telephone Encounter (Signed)
Medication: Zoloft 50mg   Directions: 1 po qd  Last given: 02/28/16 #30 Number refills: 2 Last o/v: 04/30/16 Follow up: 10-07-16 Looks like patient has not been filling on regular bases is it ok to refill?

## 2016-09-12 NOTE — Telephone Encounter (Signed)
ok'd sertraline refill #30 with one refill.

## 2016-09-15 ENCOUNTER — Other Ambulatory Visit: Payer: Self-pay | Admitting: Internal Medicine

## 2016-09-17 MED ORDER — CLONAZEPAM 1 MG PO TABS: 1.0000 mg | ORAL_TABLET | Freq: Two times a day (BID) | ORAL | 0 refills | Status: DC | PRN

## 2016-09-17 NOTE — Telephone Encounter (Signed)
ok'd rx for clonazepam #60 with no refills.   

## 2016-09-20 ENCOUNTER — Other Ambulatory Visit: Payer: Self-pay | Admitting: Internal Medicine

## 2016-09-23 ENCOUNTER — Ambulatory Visit (INDEPENDENT_AMBULATORY_CARE_PROVIDER_SITE_OTHER): Payer: Medicare Other | Admitting: Family Medicine

## 2016-09-23 ENCOUNTER — Encounter: Payer: Self-pay | Admitting: Family Medicine

## 2016-09-23 VITALS — BP 90/58 | HR 81 | Temp 98.0°F | Wt 135.2 lb

## 2016-09-23 DIAGNOSIS — R197 Diarrhea, unspecified: Secondary | ICD-10-CM

## 2016-09-23 LAB — COMPREHENSIVE METABOLIC PANEL
ALBUMIN: 4.1 g/dL (ref 3.6–5.1)
ALT: 11 U/L (ref 6–29)
AST: 23 U/L (ref 10–35)
Alkaline Phosphatase: 51 U/L (ref 33–130)
BILIRUBIN TOTAL: 0.5 mg/dL (ref 0.2–1.2)
BUN: 13 mg/dL (ref 7–25)
CHLORIDE: 105 mmol/L (ref 98–110)
CO2: 20 mmol/L (ref 20–31)
Calcium: 9.4 mg/dL (ref 8.6–10.4)
Creat: 0.87 mg/dL (ref 0.60–0.93)
Glucose, Bld: 99 mg/dL (ref 65–99)
Potassium: 3.9 mmol/L (ref 3.5–5.3)
Sodium: 140 mmol/L (ref 135–146)
TOTAL PROTEIN: 6.3 g/dL (ref 6.1–8.1)

## 2016-09-23 NOTE — Progress Notes (Signed)
Pre visit review using our clinic review tool, if applicable. No additional management support is needed unless otherwise documented below in the visit note. 

## 2016-09-23 NOTE — Patient Instructions (Signed)
Nice to see you. You need to stay well hydrated. Please drink plenty of liquids. Small frequent sips are beneficial. We will check your stool for infection. We will check your kidney function and electrolytes. If you develop abdominal pain, fevers, inability to take in good liquids, feeling dehydrated, worsening lightheadedness, or any new or change in symptoms please seek medical attention immediately.

## 2016-09-23 NOTE — Assessment & Plan Note (Addendum)
Patient with persistent liquidy stool over the last 2 weeks. Minimal soreness on exam though no focal tenderness and soreness did not repeatedly occur in the same location. No peritoneal findings. Negative guaiac. She's trying to stay well hydrated. Orthostatic vital signs without evidence of orthostasis. Patient is nontoxic appearing. Blood pressure typically runs around 100/60. Suspect viral illness versus issue with her microscopic colitis that is previously been diagnosed. We will check a CMP and a GI pathogen PCR panel. She will try to stay hydrated. Encouraged increased fluid intake. We'll plan on rechecking in 2 days. Discussed that if she develops any new symptoms she should be reevaluated in the emergency room to consider IV fluids and further workup. Given return precautions.

## 2016-09-23 NOTE — Progress Notes (Signed)
  Tommi Rumps, MD Phone: [phone]  Pam Salazar is a 71 y.o. female who presents today for same-day visit.  Patient notes 2 weeks of symptoms. Started after she changed her diet to include more proteins and vegetables. Moderate amount of fruits. Has cut out just about every other food group. Has diarrhea just about every hour. Notes it is purely liquid. Notes it is brown stool in the toilet. She had a small amount of pink is on the toilet paper yesterday though no gross blood. No fevers. Minimal nausea, no vomiting. Notes she gets an urge to go and then immediately has to go. Minimal cramping with this that occurs right before having to have a bowel movement and then does not recur until her next bowel movement. No abdominal pain. No contacts with this. She does note some lightheadedness. She has a history of microscopic colitis. She's trying to stay hydrated with Pedialyte.  ROS see history of present illness  Objective  Physical Exam Vitals:   09/23/16 1503  BP: (!) 90/58  Pulse: 81  Temp: 98 F (36.7 C)   Laying blood pressure 90/62 pulse 75 Sitting blood pressure 90/60 pulse 75 Standing blood pressure 90/60 pulse 81  BP Readings from Last 3 Encounters:  09/23/16 (!) 90/58  07/08/16 102/66  04/30/16 100/60   Wt Readings from Last 3 Encounters:  09/23/16 135 lb 3.2 oz (61.3 kg)  07/08/16 140 lb (63.5 kg)  04/30/16 138 lb 12.8 oz (63 kg)    Physical Exam  Constitutional: No distress.  HENT:  Head: Normocephalic and atraumatic.  Cardiovascular: Normal rate, regular rhythm and normal heart sounds.   Pulmonary/Chest: Effort normal and breath sounds normal.  Abdominal: Soft. Bowel sounds are normal.  Minimal scattered soreness, minimally sore at times in one location and then not sore in that location on next palpation, no rebound or guarding or distention  Genitourinary: Rectal exam shows guaiac negative stool.  Musculoskeletal: She exhibits no edema.  Neurological:  She is alert. Gait normal.  Skin: Skin is warm and dry. She is not diaphoretic.     Assessment/Plan: Please see individual problem list.  Diarrhea Patient with persistent liquidy stool over the last 2 weeks. Minimal soreness on exam though no focal tenderness and soreness did not repeatedly occur in the same location. No peritoneal findings. Negative guaiac. She's trying to stay well hydrated. Orthostatic vital signs without evidence of orthostasis. Patient is nontoxic appearing. Blood pressure typically runs around 100/60. Suspect viral illness versus issue with her microscopic colitis that is previously been diagnosed. We will check a CMP and a GI pathogen PCR panel. She will try to stay hydrated. Encouraged increased fluid intake. We'll plan on rechecking in 2 days. Discussed that if she develops any new symptoms she should be reevaluated in the emergency room to consider IV fluids and further workup. Given return precautions.   Orders Placed This Encounter  Procedures  . Gastrointestinal Pathogen Panel PCR    Standing Status:   Future    Number of Occurrences:   1    Standing Expiration Date:   09/23/2017  . Comp Met (CMET)   Tommi Rumps, MD [place]

## 2016-09-25 ENCOUNTER — Encounter: Payer: Self-pay | Admitting: Family Medicine

## 2016-09-25 ENCOUNTER — Ambulatory Visit (INDEPENDENT_AMBULATORY_CARE_PROVIDER_SITE_OTHER): Payer: Medicare Other | Admitting: Family Medicine

## 2016-09-25 DIAGNOSIS — R197 Diarrhea, unspecified: Secondary | ICD-10-CM | POA: Diagnosis not present

## 2016-09-25 NOTE — Assessment & Plan Note (Signed)
Feels somewhat better today with more energy and mildly more formed stools. Benign exam. She is not tachycardic. Her blood pressure is stable. Discussed continuing bland foods. Small sips of liquids as well. She'll monitor and if she does not continue to improve she'll be reevaluated. We will also await her stool PCR test. We will contact her once this returns. She is given return precautions.

## 2016-09-25 NOTE — Progress Notes (Signed)
Pre visit review using our clinic review tool, if applicable. No additional management support is needed unless otherwise documented below in the visit note. 

## 2016-09-25 NOTE — Patient Instructions (Addendum)
Nice to see you. I am glad you're feeling improved. We will call you when your stool studies return. If you develop abdominal pain, blood in her stool, dehydration, or any new or changing symptoms please seek medical attention immediately.

## 2016-09-25 NOTE — Progress Notes (Signed)
  Tommi Rumps, MD Phone: [phone]  Pam Salazar is a 71 y.o. female who presents today for follow-up.  Patient was seen 2 days ago for diarrhea. This had been present for 2 weeks. She notes yesterday she was about the same though today her stools are a little more formed and are not completely liquid. She notes some nausea though no vomiting or abdominal pain. No blood in her stool. She feels better today and has more energy. She has been eating small amounts of bland foods today. Also taking in small sips of Powerade 0 and water. Her lab work was previously normal. She does note some mild lightheadedness that is intermittent and unchanged from previously. Prior orthostatics were negative.   ROS see history of present illness  Objective  Physical Exam Vitals:   09/25/16 1117  BP: (!) 90/58  Pulse: 90  Temp: 97.9 F (36.6 C)    BP Readings from Last 3 Encounters:  09/25/16 (!) 90/58  09/23/16 (!) 90/58  07/08/16 102/66   Wt Readings from Last 3 Encounters:  09/25/16 134 lb 12.8 oz (61.1 kg)  09/23/16 135 lb 3.2 oz (61.3 kg)  07/08/16 140 lb (63.5 kg)    Physical Exam  Constitutional: No distress.  HENT:  Mouth/Throat: Oropharynx is clear and moist.  Cardiovascular: Normal rate, regular rhythm and normal heart sounds.   Pulmonary/Chest: Effort normal and breath sounds normal.  Abdominal: Soft. Bowel sounds are normal. She exhibits no distension. There is no tenderness. There is no rebound and no guarding.  Neurological: She is alert. Gait normal.  Skin: Skin is warm and dry. She is not diaphoretic.     Assessment/Plan: Please see individual problem list.  Diarrhea Feels somewhat better today with more energy and mildly more formed stools. Benign exam. She is not tachycardic. Her blood pressure is stable. Discussed continuing bland foods. Small sips of liquids as well. She'll monitor and if she does not continue to improve she'll be reevaluated. We will also await  her stool PCR test. We will contact her once this returns. She is given return precautions.   Tommi Rumps, MD [place]

## 2016-09-27 ENCOUNTER — Encounter: Payer: Self-pay | Admitting: Family Medicine

## 2016-09-27 LAB — GASTROINTESTINAL PATHOGEN PANEL PCR
C. DIFFICILE TOX A/B, PCR: NOT DETECTED
Campylobacter, PCR: NOT DETECTED
Cryptosporidium, PCR: NOT DETECTED
E COLI (ETEC) LT/ST, PCR: NOT DETECTED
E coli (STEC) stx1/stx2, PCR: NOT DETECTED
E coli 0157, PCR: NOT DETECTED
GIARDIA LAMBLIA, PCR: NOT DETECTED
NOROVIRUS, PCR: NOT DETECTED
ROTAVIRUS, PCR: NOT DETECTED
Salmonella, PCR: NOT DETECTED
Shigella, PCR: NOT DETECTED

## 2016-09-30 NOTE — Telephone Encounter (Signed)
I am ok with the transfer.  Let me know if you need to know anything more.

## 2016-10-07 ENCOUNTER — Ambulatory Visit: Payer: Self-pay | Admitting: Internal Medicine

## 2016-10-14 ENCOUNTER — Other Ambulatory Visit: Payer: Self-pay | Admitting: Internal Medicine

## 2016-10-14 NOTE — Telephone Encounter (Signed)
Refilled: 09/17/16 Last OV: 09/25/16 Last Labs: 09/23/16 Future OV: 04/30/17 Please advise?

## 2016-10-15 NOTE — Telephone Encounter (Signed)
Please find out what she takes this for.  Thanks. 

## 2016-10-16 ENCOUNTER — Other Ambulatory Visit: Payer: Self-pay | Admitting: Internal Medicine

## 2016-10-16 ENCOUNTER — Encounter: Payer: Self-pay | Admitting: Family Medicine

## 2016-10-16 MED ORDER — CLONAZEPAM 1 MG PO TABS: 1.0000 mg | ORAL_TABLET | Freq: Two times a day (BID) | ORAL | 0 refills | Status: DC | PRN

## 2016-10-16 NOTE — Telephone Encounter (Signed)
faxed

## 2016-10-16 NOTE — Telephone Encounter (Signed)
Please fax

## 2016-10-16 NOTE — Telephone Encounter (Signed)
Last OV 09/25/16 last filled 09/17/16 by Dr.Scott

## 2016-10-22 ENCOUNTER — Other Ambulatory Visit: Payer: Self-pay | Admitting: Family Medicine

## 2016-10-22 DIAGNOSIS — Z1231 Encounter for screening mammogram for malignant neoplasm of breast: Secondary | ICD-10-CM

## 2016-10-30 DIAGNOSIS — Z86018 Personal history of other benign neoplasm: Secondary | ICD-10-CM | POA: Diagnosis not present

## 2016-10-30 DIAGNOSIS — Z859 Personal history of malignant neoplasm, unspecified: Secondary | ICD-10-CM | POA: Diagnosis not present

## 2016-10-30 DIAGNOSIS — L720 Epidermal cyst: Secondary | ICD-10-CM | POA: Diagnosis not present

## 2016-10-30 DIAGNOSIS — Z85828 Personal history of other malignant neoplasm of skin: Secondary | ICD-10-CM | POA: Diagnosis not present

## 2016-10-30 DIAGNOSIS — Z872 Personal history of diseases of the skin and subcutaneous tissue: Secondary | ICD-10-CM | POA: Diagnosis not present

## 2016-11-09 ENCOUNTER — Other Ambulatory Visit: Payer: Self-pay | Admitting: Internal Medicine

## 2016-11-11 ENCOUNTER — Other Ambulatory Visit: Payer: Self-pay | Admitting: Family Medicine

## 2016-11-12 ENCOUNTER — Other Ambulatory Visit: Payer: Self-pay | Admitting: Family Medicine

## 2016-11-12 MED ORDER — CLONAZEPAM 1 MG PO TABS: 1.0000 mg | ORAL_TABLET | Freq: Two times a day (BID) | ORAL | 0 refills | Status: DC | PRN

## 2016-11-12 NOTE — Telephone Encounter (Signed)
Please advise for refill, las one was  4/25 for #60, last OV was 09/25/16. Thanks

## 2016-12-06 ENCOUNTER — Ambulatory Visit
Admission: RE | Admit: 2016-12-06 | Discharge: 2016-12-06 | Disposition: A | Discharge status: Home / Self Care | Payer: Medicare Other | Source: Ambulatory Visit | Attending: Family Medicine | Admitting: Family Medicine

## 2016-12-06 DIAGNOSIS — Z1231 Encounter for screening mammogram for malignant neoplasm of breast: Secondary | ICD-10-CM | POA: Diagnosis not present

## 2016-12-10 ENCOUNTER — Telehealth: Payer: Self-pay | Admitting: Family Medicine
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
## 2016-12-10 NOTE — Telephone Encounter (Signed)
Noted. If she develops any new symptoms she should be evaluated sooner. Thanks.
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
## 2016-12-13 ENCOUNTER — Encounter: Payer: Self-pay | Admitting: Family Medicine

## 2016-12-13 ENCOUNTER — Ambulatory Visit (INDEPENDENT_AMBULATORY_CARE_PROVIDER_SITE_OTHER): Payer: Medicare Other | Admitting: Family Medicine

## 2016-12-13 VITALS — BP 84/60 | HR 78 | Temp 98.3°F | Wt 134.8 lb

## 2016-12-13 DIAGNOSIS — F419 Anxiety disorder, unspecified: Secondary | ICD-10-CM | POA: Diagnosis not present

## 2016-12-13 DIAGNOSIS — I959 Hypotension, unspecified: Secondary | ICD-10-CM | POA: Diagnosis not present

## 2016-12-13 DIAGNOSIS — Z043 Encounter for examination and observation following other accident: Secondary | ICD-10-CM | POA: Diagnosis not present

## 2016-12-13 DIAGNOSIS — Z041 Encounter for examination and observation following transport accident: Secondary | ICD-10-CM

## 2016-12-13 MED ORDER — CLONAZEPAM 1 MG PO TABS: 1.0000 mg | ORAL_TABLET | Freq: Two times a day (BID) | ORAL | 0 refills | Status: DC | PRN
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
## 2016-12-13 NOTE — Assessment & Plan Note (Signed)
Chronic issues with borderline low blood pressures. Slightly lower today than previously. She notes no lightheadedness. Discussed staying well hydrated. Discussed having evaluation with cardiology at some point in the future. She'll continue to monitor.

## 2016-12-19 ENCOUNTER — Telehealth: Payer: Self-pay | Admitting: *Deleted

## 2016-12-19 NOTE — Telephone Encounter (Signed)
Patient has requested her psychology referral be sent to [facility] for psycho therapy and life skills development  Phone [phone] Fax [phone]

## 2016-12-20 ENCOUNTER — Telehealth: Payer: Self-pay | Admitting: Family Medicine

## 2016-12-20 ENCOUNTER — Ambulatory Visit: Payer: Self-pay | Admitting: Family Medicine

## 2016-12-20 NOTE — Telephone Encounter (Signed)
Please advise 

## 2016-12-20 NOTE — Telephone Encounter (Signed)
FYI pt called about not needing to come in due to not having any pain in her hip. Let me know if you need me to cancel the appt. Thank you!

## 2016-12-20 NOTE — Telephone Encounter (Signed)
fyi

## 2016-12-20 NOTE — Telephone Encounter (Signed)
You can remove her from my schedule. Thanks.

## 2016-12-23 ENCOUNTER — Telehealth: Payer: Self-pay | Admitting: *Deleted

## 2016-12-23 NOTE — Telephone Encounter (Signed)
Ok

## 2016-12-23 NOTE — Telephone Encounter (Signed)
[facility] for a referral, however the first page was missing, please include specific therapist if there is one, or list no preference, also if pt prefers a specific gender .  Fax [phone]

## 2016-12-24 ENCOUNTER — Other Ambulatory Visit: Payer: Self-pay | Admitting: Internal Medicine

## 2017-01-13 ENCOUNTER — Other Ambulatory Visit: Payer: Self-pay | Admitting: Family Medicine

## 2017-01-13 MED ORDER — CLONAZEPAM 1 MG PO TABS: 1.0000 mg | ORAL_TABLET | Freq: Two times a day (BID) | ORAL | 0 refills | Status: DC | PRN

## 2017-01-13 NOTE — Telephone Encounter (Signed)
Last OV 12/13/16 last filled 12/13/16 60 0rf

## 2017-01-14 NOTE — Telephone Encounter (Signed)
faxed

## 2017-02-10 ENCOUNTER — Other Ambulatory Visit: Payer: Self-pay | Admitting: Internal Medicine

## 2017-02-12 ENCOUNTER — Other Ambulatory Visit: Payer: Self-pay | Admitting: Family Medicine

## 2017-02-13 MED ORDER — CLONAZEPAM 1 MG PO TABS: 1.0000 mg | ORAL_TABLET | Freq: Two times a day (BID) | ORAL | 0 refills | Status: DC | PRN

## 2017-02-17 ENCOUNTER — Ambulatory Visit (INDEPENDENT_AMBULATORY_CARE_PROVIDER_SITE_OTHER): Payer: Medicare Other

## 2017-02-17 ENCOUNTER — Encounter: Payer: Self-pay | Admitting: Family Medicine

## 2017-02-17 ENCOUNTER — Ambulatory Visit (INDEPENDENT_AMBULATORY_CARE_PROVIDER_SITE_OTHER): Payer: Medicare Other | Admitting: Family Medicine

## 2017-02-17 VITALS — BP 110/60 | HR 78 | Temp 97.8°F | Wt 134.8 lb

## 2017-02-17 DIAGNOSIS — N644 Mastodynia: Secondary | ICD-10-CM | POA: Diagnosis not present

## 2017-02-17 DIAGNOSIS — M79622 Pain in left upper arm: Secondary | ICD-10-CM

## 2017-02-17 DIAGNOSIS — M79641 Pain in right hand: Secondary | ICD-10-CM

## 2017-02-17 NOTE — Patient Instructions (Signed)
Nice to see you. We'll get a mammogram and ultrasounds to evaluate your left axilla and left breast discomfort. We'll get an x-ray of your right hand today. We'll call you with the results.

## 2017-02-17 NOTE — Assessment & Plan Note (Signed)
Tenderness in left breast and left axilla. No palpable abnormalities in these areas. We will obtain mammogram and ultrasounds.

## 2017-02-17 NOTE — Progress Notes (Signed)
Tommi Rumps, MD Phone: [phone]  Pam Salazar is a 71 y.o. female who presents today for same-day visit.  Patient notes for a few weeks she has had discomfort in her left axilla. Notes there is some tenderness there. No lumps. Has not gotten worse though has not improved. Bothers her more if she squeezes her axilla with her arm. No noted breast changes.  She notes her right hand has been bothering her for some time now. The right ring finger is difficult to fully flex at the MCP joint. He notes the middle finger started to do this more recently. Has discomfort over the palmar aspect of her MCP joints. Has not had imaging or been evaluated for this.  ROS see history of present illness  Objective  Physical Exam Vitals:   02/17/17 1309  BP: 110/60  Pulse: 78  Temp: 97.8 F (36.6 C)  SpO2: 97%    BP Readings from Last 3 Encounters:  02/17/17 110/60  12/13/16 (!) 84/60  09/25/16 (!) 90/58   Wt Readings from Last 3 Encounters:  02/17/17 134 lb 12.8 oz (61.1 kg)  12/13/16 134 lb 12.8 oz (61.1 kg)  09/25/16 134 lb 12.8 oz (61.1 kg)    Physical Exam  Constitutional: No distress.  Cardiovascular: Normal rate, regular rhythm and normal heart sounds.   Pulmonary/Chest: Effort normal and breath sounds normal.  Genitourinary:  Genitourinary Comments: Left axilla with slight tenderness anteriorly, also tenderness in the left upper outer quadrant of the breast,, no palpable masses in these areas, no other axillary or breast tenderness, no other noted breast or axillary changes  Musculoskeletal:  Right hand with no joint swelling noted, slight discomfort over the second and third MCP joints, no erythema or warmth, slight decreased range of motion on flexion of these joints, left hand with no noted abnormalities, bilateral hands with 5/5 grip strength, bilateral hands are warm and well-perfused  Neurological: She is alert. Gait normal.  Skin: She is not diaphoretic.      Assessment/Plan: Please see individual problem list.  Breast tenderness in female Tenderness in left breast and left axilla. No palpable abnormalities in these areas. We will obtain mammogram and ultrasounds.  Right hand pain Chronic issue with this. Slight decreased range of motion on flexion of the second and third MCP joints. Slight tenderness. We'll obtain an x-ray to evaluate for arthritis or underlying inflammatory arthritis. Could consider referral to orthopedics.   Orders Placed This Encounter  Procedures  . DG Hand Complete Right    Standing Status:   Future    Number of Occurrences:   1    Standing Expiration Date:   04/19/2018    Order Specific Question:   Reason for Exam (SYMPTOM  OR DIAGNOSIS REQUIRED)    Answer:   chronic right hand pain, soreness of 2nd and 3rd MCP joints    Order Specific Question:   Preferred imaging location?    Answer:   [facility] Specific Question:   Radiology Contrast Protocol - do NOT remove file path    Answer:   [email or web address]  . MM DIAG BREAST TOMO BILATERAL    Standing Status:   Future    Standing Expiration Date:   04/19/2018    Order Specific Question:   Reason for Exam (SYMPTOM  OR DIAGNOSIS REQUIRED)    Answer:   left axillary pain and tenderness, left breast upper outer quadrant tenderness, no mass    Order Specific Question:  Preferred imaging location?    Answer:   [facility]  . US BREAST LTD UNI LEFT INC AXILLA    Standing Status:   Future    Standing Expiration Date:   04/19/2018    Order Specific Question:   Reason for Exam (SYMPTOM  OR DIAGNOSIS REQUIRED)    Answer:   left axillary pain and tenderness, left breast upper outer quadrant tenderness, no mass    Order Specific Question:   Preferred imaging location?    Answer:   [facility]  . US BREAST LTD UNI RIGHT INC AXILLA    Standing Status:   Future    Standing Expiration Date:    04/19/2018    Order Specific Question:   Reason for Exam (SYMPTOM  OR DIAGNOSIS REQUIRED)    Answer:   left axillary pain and tenderness, left breast upper outer quadrant tenderness, no mass    Order Specific Question:   Preferred imaging location?    Answer:   [facility]   Tommi Rumps, MD [place]

## 2017-02-17 NOTE — Assessment & Plan Note (Signed)
Chronic issue with this. Slight decreased range of motion on flexion of the second and third MCP joints. Slight tenderness. We'll obtain an x-ray to evaluate for arthritis or underlying inflammatory arthritis. Could consider referral to orthopedics.

## 2017-02-18 ENCOUNTER — Other Ambulatory Visit: Payer: Self-pay | Admitting: Family Medicine

## 2017-02-18 DIAGNOSIS — M1990 Unspecified osteoarthritis, unspecified site: Secondary | ICD-10-CM

## 2017-02-22 ENCOUNTER — Other Ambulatory Visit: Payer: Self-pay | Admitting: Family Medicine

## 2017-02-22 DIAGNOSIS — N644 Mastodynia: Secondary | ICD-10-CM

## 2017-02-25 ENCOUNTER — Other Ambulatory Visit: Payer: Self-pay

## 2017-02-25 DIAGNOSIS — M79622 Pain in left upper arm: Secondary | ICD-10-CM

## 2017-03-05 DIAGNOSIS — B9689 Other specified bacterial agents as the cause of diseases classified elsewhere: Secondary | ICD-10-CM | POA: Diagnosis not present

## 2017-03-05 DIAGNOSIS — J019 Acute sinusitis, unspecified: Secondary | ICD-10-CM | POA: Diagnosis not present

## 2017-03-10 ENCOUNTER — Ambulatory Visit
Admission: RE | Admit: 2017-03-10 | Discharge: 2017-03-10 | Disposition: A | Discharge status: Home / Self Care | Payer: Medicare Other | Source: Ambulatory Visit | Attending: Family Medicine | Admitting: Family Medicine

## 2017-03-10 DIAGNOSIS — R928 Other abnormal and inconclusive findings on diagnostic imaging of breast: Secondary | ICD-10-CM | POA: Diagnosis not present

## 2017-03-10 DIAGNOSIS — N644 Mastodynia: Secondary | ICD-10-CM | POA: Insufficient documentation

## 2017-03-10 DIAGNOSIS — M79622 Pain in left upper arm: Secondary | ICD-10-CM

## 2017-03-10 DIAGNOSIS — N6489 Other specified disorders of breast: Secondary | ICD-10-CM | POA: Diagnosis not present

## 2017-03-17 ENCOUNTER — Telehealth: Payer: Self-pay | Admitting: Family Medicine

## 2017-03-17 NOTE — Telephone Encounter (Signed)
Last OV 02/17/2017 Next OV 04/30/2017 Last refill 02/13/2017

## 2017-03-18 NOTE — Telephone Encounter (Signed)
Patient notified rx was faxed 03/17/17

## 2017-03-18 NOTE — Telephone Encounter (Signed)
Pt has requested to have this refilled today   

## 2017-03-19 NOTE — Telephone Encounter (Signed)
Orders

## 2017-04-10 ENCOUNTER — Emergency Department: Payer: Medicare Other

## 2017-04-10 ENCOUNTER — Emergency Department
Admission: EM | Admit: 2017-04-10 | Discharge: 2017-04-10 | Disposition: A | Discharge status: Home / Self Care | Payer: Medicare Other | Attending: Emergency Medicine | Admitting: Emergency Medicine

## 2017-04-10 DIAGNOSIS — E039 Hypothyroidism, unspecified: Secondary | ICD-10-CM | POA: Diagnosis not present

## 2017-04-10 DIAGNOSIS — Z87891 Personal history of nicotine dependence: Secondary | ICD-10-CM | POA: Diagnosis not present

## 2017-04-10 DIAGNOSIS — Y999 Unspecified external cause status: Secondary | ICD-10-CM | POA: Diagnosis not present

## 2017-04-10 DIAGNOSIS — Y939 Activity, unspecified: Secondary | ICD-10-CM | POA: Diagnosis not present

## 2017-04-10 DIAGNOSIS — X58XXXA Exposure to other specified factors, initial encounter: Secondary | ICD-10-CM | POA: Diagnosis not present

## 2017-04-10 DIAGNOSIS — T148XXA Other injury of unspecified body region, initial encounter: Secondary | ICD-10-CM

## 2017-04-10 DIAGNOSIS — R079 Chest pain, unspecified: Secondary | ICD-10-CM | POA: Diagnosis not present

## 2017-04-10 DIAGNOSIS — Z79899 Other long term (current) drug therapy: Secondary | ICD-10-CM | POA: Diagnosis not present

## 2017-04-10 DIAGNOSIS — R0789 Other chest pain: Secondary | ICD-10-CM | POA: Diagnosis not present

## 2017-04-10 DIAGNOSIS — S29012A Strain of muscle and tendon of back wall of thorax, initial encounter: Secondary | ICD-10-CM | POA: Diagnosis not present

## 2017-04-10 DIAGNOSIS — Y929 Unspecified place or not applicable: Secondary | ICD-10-CM | POA: Insufficient documentation

## 2017-04-10 LAB — CBC
HCT: 42.3 % (ref 35.0–47.0)
HEMOGLOBIN: 14.2 g/dL (ref 12.0–16.0)
MCH: 29.7 pg (ref 26.0–34.0)
MCHC: 33.5 g/dL (ref 32.0–36.0)
MCV: 88.6 fL (ref 80.0–100.0)
PLATELETS: 199 10*3/uL (ref 150–440)
RBC: 4.78 MIL/uL (ref 3.80–5.20)
RDW: 13.3 % (ref 11.5–14.5)
WBC: 5.7 10*3/uL (ref 3.6–11.0)

## 2017-04-10 LAB — BASIC METABOLIC PANEL
ANION GAP: 12 (ref 5–15)
BUN: 25 mg/dL — ABNORMAL HIGH (ref 6–20)
CALCIUM: 9.4 mg/dL (ref 8.9–10.3)
CO2: 24 mmol/L (ref 22–32)
Chloride: 103 mmol/L (ref 101–111)
Creatinine, Ser: 0.74 mg/dL (ref 0.44–1.00)
Glucose, Bld: 82 mg/dL (ref 65–99)
Potassium: 4.1 mmol/L (ref 3.5–5.1)
SODIUM: 139 mmol/L (ref 135–145)

## 2017-04-10 LAB — TROPONIN I: Troponin I: <0.03 ng/mL (ref <0.03)

## 2017-04-10 MED ORDER — IOPAMIDOL (ISOVUE-370) INJECTION 76%
75.0000 mL | Freq: Once | INTRAVENOUS | Status: AC | PRN
  Administered 2017-04-10: 75 mL via INTRAVENOUS

## 2017-04-10 NOTE — ED Notes (Signed)
Ct r/o pe added on for her symptoms from this morning. Pt is not dizzy and able to ambulate to bathroom with no problems. No pain at this time. vss

## 2017-04-10 NOTE — ED Triage Notes (Signed)
Pt states she was brushing her teeth this morning and had sharp pain in her L chest/L shoulder.  Pt states she went to neighbors house and got a non-enteric coated 325mg  of aspirin.  Pt states she laid down afterwards, but states she was feeling dizzy and lightheaded so she decided to come into hospital instead.  Pt states she does have history of anxiety, but also feels like pain is more muscle related.  Pt in NAD, A&Ox4.
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
## 2017-04-10 NOTE — ED Notes (Signed)
Pt brought back to room 9  With chest pain. Pt comfortable at this time and all lab results are back and negative. Pt on monitor awaiting dr .

## 2017-04-10 NOTE — ED Notes (Signed)
NAD noted at time of D/C. Pt denies questions or concerns. Pt ambulatory to the lobby at this time. Pt refused wheelchair to the lobby.  

## 2017-04-14 ENCOUNTER — Encounter: Payer: Self-pay | Admitting: Family Medicine

## 2017-04-15 MED ORDER — CLONAZEPAM 1 MG PO TABS
1.0000 mg | ORAL_TABLET | Freq: Two times a day (BID) | ORAL | 0 refills | Status: DC | PRN
Start: 2017-04-15 — End: 2017-05-13

## 2017-04-15 NOTE — Telephone Encounter (Signed)
Last OV 02/17/17 last filled 03/17/17 60 0rf

## 2017-04-15 NOTE — Telephone Encounter (Signed)
Please fax

## 2017-04-16 NOTE — Telephone Encounter (Signed)
faxed

## 2017-04-21 ENCOUNTER — Other Ambulatory Visit: Payer: Self-pay | Admitting: Internal Medicine

## 2017-04-21 DIAGNOSIS — Z23 Encounter for immunization: Secondary | ICD-10-CM | POA: Diagnosis not present

## 2017-04-30 ENCOUNTER — Ambulatory Visit (INDEPENDENT_AMBULATORY_CARE_PROVIDER_SITE_OTHER): Payer: Medicare Other | Admitting: Family Medicine

## 2017-04-30 ENCOUNTER — Encounter: Payer: Self-pay | Admitting: Family Medicine

## 2017-04-30 VITALS — BP 98/62 | HR 83 | Temp 98.2°F | Ht 62.5 in | Wt 132.8 lb

## 2017-04-30 DIAGNOSIS — E039 Hypothyroidism, unspecified: Secondary | ICD-10-CM | POA: Diagnosis not present

## 2017-04-30 DIAGNOSIS — N952 Postmenopausal atrophic vaginitis: Secondary | ICD-10-CM

## 2017-04-30 DIAGNOSIS — R0789 Other chest pain: Secondary | ICD-10-CM | POA: Insufficient documentation

## 2017-04-30 DIAGNOSIS — Z13220 Encounter for screening for lipoid disorders: Secondary | ICD-10-CM

## 2017-04-30 DIAGNOSIS — E559 Vitamin D deficiency, unspecified: Secondary | ICD-10-CM

## 2017-04-30 DIAGNOSIS — Z0001 Encounter for general adult medical examination with abnormal findings: Secondary | ICD-10-CM | POA: Diagnosis not present

## 2017-04-30 DIAGNOSIS — Z1159 Encounter for screening for other viral diseases: Secondary | ICD-10-CM

## 2017-04-30 DIAGNOSIS — N644 Mastodynia: Secondary | ICD-10-CM | POA: Diagnosis not present

## 2017-04-30 MED ORDER — TETANUS-DIPHTH-ACELL PERTUSSIS 5-2.5-18.5 LF-MCG/0.5 IM SUSP: 0.5000 mL | Freq: Once | INTRAMUSCULAR | 0 refills | Status: AC
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
## 2017-04-30 NOTE — Assessment & Plan Note (Signed)
Physical exam completed.  Breast exam stable from prior.  Recent workup with ultrasound and mammogram with no underlying cause for discomfort.  Encourage diet and exercise.  Lab work as outlined below.  Given vaccination prescription for tetanus vaccine.  Advised to check with her pharmacy regarding this.

## 2017-04-30 NOTE — Patient Instructions (Signed)
Nice to see you. Please return for fasting lab work. Please check with your pharmacy regarding the tetanus vaccination. If you have recurrence of chest discomfort or any different symptoms please be evaluated.

## 2017-04-30 NOTE — Assessment & Plan Note (Signed)
Workup negative.  This was reassuring to her.  She will continue to monitor.

## 2017-04-30 NOTE — Assessment & Plan Note (Signed)
Negative workup in the ED.  Suspect musculoskeletal cause.  She will monitor for recurrence.  Given return precautions.

## 2017-05-02 ENCOUNTER — Other Ambulatory Visit (INDEPENDENT_AMBULATORY_CARE_PROVIDER_SITE_OTHER): Payer: Medicare Other

## 2017-05-02 DIAGNOSIS — E559 Vitamin D deficiency, unspecified: Secondary | ICD-10-CM | POA: Diagnosis not present

## 2017-05-02 DIAGNOSIS — E039 Hypothyroidism, unspecified: Secondary | ICD-10-CM

## 2017-05-02 DIAGNOSIS — Z1159 Encounter for screening for other viral diseases: Secondary | ICD-10-CM | POA: Diagnosis not present

## 2017-05-02 DIAGNOSIS — Z13220 Encounter for screening for lipoid disorders: Secondary | ICD-10-CM | POA: Diagnosis not present

## 2017-05-02 LAB — COMPREHENSIVE METABOLIC PANEL
ALBUMIN: 4.4 g/dL (ref 3.5–5.2)
ALT: 13 U/L (ref 0–35)
AST: 23 U/L (ref 0–37)
Alkaline Phosphatase: 49 U/L (ref 39–117)
BUN: 18 mg/dL (ref 6–23)
CO2: 28 meq/L (ref 19–32)
Calcium: 9.8 mg/dL (ref 8.4–10.5)
Chloride: 104 mEq/L (ref 96–112)
Creatinine, Ser: 0.76 mg/dL (ref 0.40–1.20)
GFR: 79.76 mL/min (ref >60.00)
Glucose, Bld: 86 mg/dL (ref 70–99)
POTASSIUM: 4 meq/L (ref 3.5–5.1)
SODIUM: 140 meq/L (ref 135–145)
Total Bilirubin: 0.6 mg/dL (ref 0.2–1.2)
Total Protein: 7.3 g/dL (ref 6.0–8.3)

## 2017-05-02 LAB — LIPID PANEL
CHOL/HDL RATIO: 3
CHOLESTEROL: 183 mg/dL (ref 0–200)
HDL: 59.3 mg/dL (ref >39.00)
LDL CALC: 97 mg/dL (ref 0–99)
NonHDL: 123.56
Triglycerides: 131 mg/dL (ref 0.0–149.0)
VLDL: 26.2 mg/dL (ref 0.0–40.0)

## 2017-05-02 LAB — VITAMIN D 25 HYDROXY (VIT D DEFICIENCY, FRACTURES): VITD: 28.21 ng/mL — ABNORMAL LOW (ref 30.00–100.00)

## 2017-05-02 LAB — TSH: TSH: 6.22 u[IU]/mL — ABNORMAL HIGH (ref 0.35–4.50)

## 2017-05-03 LAB — HEPATITIS C ANTIBODY
Hepatitis C Ab: NONREACTIVE
SIGNAL TO CUT-OFF: 0.01 (ref <1.00)

## 2017-05-05 ENCOUNTER — Telehealth: Payer: Self-pay | Admitting: Family Medicine

## 2017-05-05 NOTE — Telephone Encounter (Signed)
TSH results given to patient and appointment made for 6 weeks for a recheck.

## 2017-05-06 NOTE — Progress Notes (Signed)
Labs all normal. Please inform patient.   Pam Salazar. [name], MD 05/06/2017 2:45 PM

## 2017-05-09 ENCOUNTER — Ambulatory Visit (INDEPENDENT_AMBULATORY_CARE_PROVIDER_SITE_OTHER): Payer: Medicare Other | Admitting: Obstetrics and Gynecology

## 2017-05-09 ENCOUNTER — Encounter: Payer: Self-pay | Admitting: Obstetrics and Gynecology

## 2017-05-09 VITALS — BP 90/58 | HR 80 | Ht 62.5 in | Wt 133.0 lb

## 2017-05-09 DIAGNOSIS — N951 Menopausal and female climacteric states: Secondary | ICD-10-CM | POA: Insufficient documentation

## 2017-05-09 DIAGNOSIS — N941 Unspecified dyspareunia: Secondary | ICD-10-CM | POA: Diagnosis not present

## 2017-05-09 MED ORDER — PRASTERONE 6.5 MG VA INST: 6.5000 mg | VAGINAL_INSERT | Freq: Every day | VAGINAL | 11 refills | Status: DC

## 2017-05-09 NOTE — Progress Notes (Signed)
Chief Complaint: Vaginal Dryness  HPI:      Pam Salazar is a 71 y.o. [phone] who LMP was No LMP recorded. Patient has had a hysterectomy. She presents today for a problem visit.  She complains of: vaginal dryness and painful intercourse. She reports that she is currently still sexually active. Her husband is still interested in intercourse. They have been having intercourse with replens lubricant, but it is no longer sufficient. She wishes to remain sexually active. Her symptoms of vaginal dryness have progressively worsened over the last year. She denies issues with abnormal odor. Denies vaginal bleeding. Denies vaginal or vulvar itching.  She wished to avoid vaginal estrogen because of familial history of breast cancer.   PMHx: She  has a past medical history of Arthritis, Depression, Hypercholesterolemia, Hypothyroidism, Low blood pressure (10/25/2014), Panic disorder, Seizures ([place]), Shoulder pain, left, Vertigo, and Vitamin D deficiency. Also,  has a past surgical history that includes ORIF fibula fracture (Right, 2003); Tonsillectomy (1974); Cataract extraction w/ intraocular lens  implant, bilateral; Colonoscopy with propofol (N/A, 05/05/2015); Shoulder surgery (12/2015); and Abdominal hysterectomy (1981)., family history includes Arthritis in her father and mother; Breast cancer (age of onset: 46) in her sister; Breast cancer (age of onset: 7) in her maternal aunt; Colon cancer in her maternal uncle; Diabetes in her brother; Heart disease in her brother; Hyperlipidemia in her mother; Prostate cancer in her father.,  reports that she has quit smoking. Her smoking use included cigarettes. She quit smokeless tobacco use about 3 years ago. She reports that she does not drink alcohol or use drugs.  She has a current medication list which includes the following prescription(s): clonazepam, diphenhydramine-acetaminophen, epinephrine, levothyroxine, multiple vitamins-minerals, sertraline, and  simvastatin. Also, is allergic to duloxetine hcl and ezetimibe-simvastatin.  Review of Systems  Constitutional: Negative for chills, fever, malaise/fatigue and weight loss.  HENT: Negative for congestion, hearing loss and sinus pain.   Eyes: Negative for blurred vision and double vision.  Respiratory: Negative for cough, sputum production, shortness of breath and wheezing.   Cardiovascular: Negative for chest pain, palpitations, orthopnea and leg swelling.  Gastrointestinal: Negative for abdominal pain, constipation, diarrhea, nausea and vomiting.  Genitourinary: Negative for dysuria, flank pain, frequency, hematuria and urgency.  Musculoskeletal: Negative for back pain, falls and joint pain.  Skin: Negative for itching and rash.  Neurological: Negative for dizziness and headaches.  Psychiatric/Behavioral: Negative for depression, substance abuse and suicidal ideas. The patient is not nervous/anxious.     Objective: BP (!) 90/58   Pulse 80   Ht 5' 2.5" (1.588 m)   Wt 133 lb (60.3 kg)   BMI 23.94 kg/m  Physical Exam  Constitutional: She is oriented to person, place, and time. She appears well-developed.  Genitourinary: There is no lesion on the right labia. There is no lesion on the left labia. Vagina exhibits rugosity. Vagina exhibits no lesion. No tenderness or bleeding in the vagina. No foreign body in the vagina. No vaginal discharge found. Right adnexum does not display mass. Left adnexum does not display mass and does not display fullness.  Genitourinary Comments: Uterus and cervix surgically absent. No pelvic masses appreciated. Vagina dry, no odor, no abnormal discharge.   HENT:  Head: Normocephalic and atraumatic.  Eyes: EOM are normal.  Neck: No thyromegaly present.  Cardiovascular: Normal rate, regular rhythm and normal heart sounds.  Pulmonary/Chest: Effort normal and breath sounds normal. Right breast exhibits no inverted nipple, no mass, no nipple discharge and no skin  change.  Left breast exhibits no inverted nipple, no mass, no nipple discharge and no skin change.  Abdominal: Soft. Bowel sounds are normal. She exhibits no distension and no mass.  Neurological: She is oriented to person, place, and time.  Skin: Skin is warm and dry.  Psychiatric: She has a normal mood and affect. Her behavior is normal. Judgment and thought content normal.  Vitals reviewed.   ASSESSMENT/PLAN:    Problem List Items Addressed This Visit    Menopausal vaginal dryness   Dyspareunia in female - Primary      71 yo female here for complaints of vaginal dryness and dyspareunia.  Discussed option of lubricants, Intrarosa vaginal applicators, and MonaLisa Touch. Patient was given a 14 day sample of Intrarosa to try and prescription was sent if she finds this therapy effective. Discussed MonaLisa Touch procedure and offered referral. Patient is not interested at this time, but will contact me in the future through MyChart if she decides she would like a referral.

## 2017-05-12 ENCOUNTER — Other Ambulatory Visit: Payer: Self-pay | Admitting: Family Medicine

## 2017-05-13 ENCOUNTER — Other Ambulatory Visit: Payer: Self-pay | Admitting: Family Medicine

## 2017-05-14 NOTE — Telephone Encounter (Signed)
Last Ov 04/30/17 last filled 04/15/17 60 0rf patient states she will be out of medication on friday

## 2017-05-14 NOTE — Telephone Encounter (Signed)
faxed

## 2017-06-11 ENCOUNTER — Telehealth: Payer: Self-pay | Admitting: Radiology

## 2017-06-11 DIAGNOSIS — E039 Hypothyroidism, unspecified: Secondary | ICD-10-CM

## 2017-06-11 NOTE — Telephone Encounter (Signed)
Ordered

## 2017-06-11 NOTE — Telephone Encounter (Signed)
Pt coming in for labs tomorrow, please place future orders. Thank you.  

## 2017-06-11 NOTE — Addendum Note (Signed)
Addended by: Leone Haven on: 06/11/2017 12:34 PM   Modules accepted: Orders

## 2017-06-12 ENCOUNTER — Other Ambulatory Visit: Payer: Self-pay | Admitting: Internal Medicine

## 2017-06-12 ENCOUNTER — Other Ambulatory Visit (INDEPENDENT_AMBULATORY_CARE_PROVIDER_SITE_OTHER): Payer: Medicare Other

## 2017-06-12 DIAGNOSIS — E039 Hypothyroidism, unspecified: Secondary | ICD-10-CM

## 2017-06-12 LAB — TSH: TSH: 5.71 u[IU]/mL — ABNORMAL HIGH (ref 0.35–4.50)

## 2017-06-13 MED ORDER — CLONAZEPAM 1 MG PO TABS
1.0000 mg | ORAL_TABLET | Freq: Two times a day (BID) | ORAL | 0 refills | Status: DC | PRN
Start: 2017-06-13 — End: 2017-07-15

## 2017-06-13 NOTE — Telephone Encounter (Signed)
Last Ov 04/30/17 last filled 05/14/17 60 0rf

## 2017-06-13 NOTE — Telephone Encounter (Signed)
Okay to refill? 

## 2017-06-14 ENCOUNTER — Other Ambulatory Visit: Payer: Self-pay | Admitting: Family Medicine

## 2017-06-14 DIAGNOSIS — E039 Hypothyroidism, unspecified: Secondary | ICD-10-CM

## 2017-06-14 MED ORDER — LEVOTHYROXINE SODIUM 100 MCG PO TABS: 100.0000 ug | ORAL_TABLET | Freq: Every day | ORAL | 3 refills | Status: DC

## 2017-06-22 ENCOUNTER — Other Ambulatory Visit: Payer: Self-pay | Admitting: Internal Medicine

## 2017-06-26 DIAGNOSIS — M25519 Pain in unspecified shoulder: Secondary | ICD-10-CM | POA: Diagnosis not present

## 2017-06-26 DIAGNOSIS — M25512 Pain in left shoulder: Secondary | ICD-10-CM | POA: Diagnosis not present

## 2017-07-14 ENCOUNTER — Other Ambulatory Visit: Payer: Self-pay | Admitting: Family Medicine

## 2017-07-14 NOTE — Telephone Encounter (Signed)
Okay to refill? Last written on: 06/13/17 #60 with no refills.  LOV: 04/30/17 NOV: 10/28/17

## 2017-07-15 ENCOUNTER — Other Ambulatory Visit: Payer: Self-pay | Admitting: Family Medicine

## 2017-07-15 MED ORDER — CLONAZEPAM 1 MG PO TABS: 1.0000 mg | ORAL_TABLET | Freq: Two times a day (BID) | ORAL | 0 refills | Status: DC | PRN

## 2017-07-15 NOTE — Telephone Encounter (Signed)
Copied from [place]. Topic: Quick Communication - See Telephone Encounter >> Jul 15, 2017  2:00 PM Conception Chancy, NT wrote: CRM for notification. See Telephone encounter for:  07/15/17.  Pt is requesting a refill on Clonazepam, she runs out today. Has already contact pharmacy[facility]

## 2017-07-15 NOTE — Telephone Encounter (Signed)
Refill request for Clonazepam, last seen [id number], last filled [id number].  Please advise.

## 2017-07-16 NOTE — Telephone Encounter (Signed)
faxed

## 2017-07-17 ENCOUNTER — Encounter: Payer: Self-pay | Admitting: Family Medicine

## 2017-07-20 ENCOUNTER — Other Ambulatory Visit: Payer: Self-pay | Admitting: Family Medicine

## 2017-07-20 DIAGNOSIS — Z5181 Encounter for therapeutic drug level monitoring: Secondary | ICD-10-CM

## 2017-07-29 ENCOUNTER — Other Ambulatory Visit: Payer: Self-pay

## 2017-07-30 ENCOUNTER — Other Ambulatory Visit (INDEPENDENT_AMBULATORY_CARE_PROVIDER_SITE_OTHER): Payer: Medicare Other

## 2017-07-30 DIAGNOSIS — Z5181 Encounter for therapeutic drug level monitoring: Secondary | ICD-10-CM | POA: Diagnosis not present

## 2017-07-30 DIAGNOSIS — E039 Hypothyroidism, unspecified: Secondary | ICD-10-CM

## 2017-07-30 LAB — BASIC METABOLIC PANEL
BUN: 23 mg/dL (ref 6–23)
CALCIUM: 9.5 mg/dL (ref 8.4–10.5)
CO2: 26 mEq/L (ref 19–32)
Chloride: 105 mEq/L (ref 96–112)
Creatinine, Ser: 0.73 mg/dL (ref 0.40–1.20)
GFR: 83.49 mL/min (ref >60.00)
GLUCOSE: 89 mg/dL (ref 70–99)
Potassium: 4.1 mEq/L (ref 3.5–5.1)
SODIUM: 140 meq/L (ref 135–145)

## 2017-07-30 LAB — TSH: TSH: 1.92 u[IU]/mL (ref 0.35–4.50)

## 2017-08-08 ENCOUNTER — Other Ambulatory Visit: Payer: Self-pay | Admitting: Family Medicine

## 2017-08-12 ENCOUNTER — Other Ambulatory Visit: Payer: Self-pay

## 2017-08-12 MED ORDER — SERTRALINE HCL 50 MG PO TABS: 50.0000 mg | ORAL_TABLET | Freq: Every day | ORAL | 1 refills | Status: DC

## 2017-08-14 ENCOUNTER — Other Ambulatory Visit: Payer: Self-pay | Admitting: Family Medicine

## 2017-08-14 MED ORDER — CLONAZEPAM 1 MG PO TABS: 1.0000 mg | ORAL_TABLET | Freq: Two times a day (BID) | ORAL | 2 refills | Status: DC | PRN

## 2017-08-14 NOTE — Telephone Encounter (Signed)
Last Ov 04/30/17 last filled 06/14/17 90 3rf

## 2017-08-14 NOTE — Telephone Encounter (Signed)
Refilled: 07/15/2017 Last OV: 04/30/2017 Next OV: 10/28/2017

## 2017-08-14 NOTE — Telephone Encounter (Signed)
Please fax

## 2017-08-15 NOTE — Telephone Encounter (Signed)
faxed

## 2017-09-06 ENCOUNTER — Other Ambulatory Visit: Payer: Self-pay | Admitting: Internal Medicine

## 2017-09-08 ENCOUNTER — Telehealth: Payer: Self-pay

## 2017-09-08 NOTE — Telephone Encounter (Signed)
Copied from [place]. Topic: Bill or Statement - Patient/Guarantor Inquiry >> Sep 08, 2017  3:36 PM [place], Tamburri B wrote: Pt received a bill for an appt she had in Nov of 2018. Pt states this her 6 month check up and the way it was coded medicare wont cover the appt but it was coded as a hospital follow up appt.

## 2017-09-09 NOTE — Telephone Encounter (Signed)
I spoke with the patient she is aware that her information has been sent to charge correction for review and possible resubmission to her insurance carrier after coding change .

## 2017-10-06 ENCOUNTER — Ambulatory Visit (INDEPENDENT_AMBULATORY_CARE_PROVIDER_SITE_OTHER): Payer: Medicare Other | Admitting: Family Medicine

## 2017-10-06 ENCOUNTER — Encounter: Payer: Self-pay | Admitting: Family Medicine

## 2017-10-06 VITALS — BP 94/66 | HR 73 | Temp 98.4°F | Wt 133.0 lb

## 2017-10-06 DIAGNOSIS — B9789 Other viral agents as the cause of diseases classified elsewhere: Secondary | ICD-10-CM

## 2017-10-06 DIAGNOSIS — J069 Acute upper respiratory infection, unspecified: Secondary | ICD-10-CM

## 2017-10-06 MED ORDER — PROMETHAZINE-CODEINE 6.25-10 MG/5ML PO SYRP: 5.0000 mL | ORAL_SOLUTION | Freq: Three times a day (TID) | ORAL | 0 refills | Status: DC | PRN

## 2017-10-06 NOTE — Progress Notes (Signed)
Subjective:    Patient ID: Pam Salazar, female    DOB: 24-Nov-1945, 72 y.o.   MRN: [id number]  HPI This is a 72 yo female who presents today with 4 days of sore throat, headache. Started mucinex, warm salt water gargles. Feels weak. Felt a little better this morning. Cough dry, occasional sputum that is thick and green. No wheeze, no SOB. Achy.  No nasal congestion. No diarrhea. No meds for headache. Husband with similar symptoms. Had flu shot in fall.   Past Medical History:  Diagnosis Date  . Arthritis    hips, lower back  . Depression   . Hypercholesterolemia   . Hypothyroidism   . Low blood pressure 10/25/2014  . Panic disorder    previous agoraphobia  . Seizures ([facility])    febrile - as infant  . Shoulder pain, left    bursa  . Vertigo    positional - rare  . Vitamin D deficiency    Past Surgical History:  Procedure Laterality Date  . ABDOMINAL HYSTERECTOMY  1981   partial - secondary to bleeding, ovaries not removed -   . CATARACT EXTRACTION W/ INTRAOCULAR LENS  IMPLANT, BILATERAL    . COLONOSCOPY WITH PROPOFOL N/A 05/05/2015   Procedure: COLONOSCOPY WITH PROPOFOL;  Surgeon: Lucilla Lame, MD;  Location: [facility];  Service: Endoscopy;  Laterality: N/A;  . ORIF FIBULA FRACTURE Right 2003   right lower extremitiy  . SHOULDER SURGERY  12/2015  . TONSILLECTOMY  1974   Family History  Problem Relation Age of Onset  . Arthritis Mother   . Hyperlipidemia Mother   . Arthritis Father   . Prostate cancer Father   . Breast cancer Sister 41  . Heart disease Brother        heart attack  . Diabetes Brother   . Breast cancer Maternal Aunt 85  . Colon cancer Maternal Uncle    Social History   Tobacco Use  . Smoking status: Former Smoker    Types: Cigarettes  . Smokeless tobacco: Former Systems developer    Quit date: 07/26/2013  . Tobacco comment:    Substance Use Topics  . Alcohol use: No    Alcohol/week: 0.0 oz  . Drug use: No      Review of Systems Per HPI      Objective:   Physical Exam  Constitutional: She is oriented to person, place, and time. She appears well-developed and well-nourished. No distress.  HENT:  Head: Normocephalic and atraumatic.  Right Ear: Tympanic membrane, external ear and ear canal normal.  Left Ear: Tympanic membrane, external ear and ear canal normal.  Nose: Mucosal edema and rhinorrhea present.  Mouth/Throat: Posterior oropharyngeal erythema present. No oropharyngeal exudate, posterior oropharyngeal edema or tonsillar abscesses.  Small amount white post nasal discharge.   Eyes: Conjunctivae are normal.  Neck: Normal range of motion. Neck supple.  Cardiovascular: Normal rate, regular rhythm and normal heart sounds.  Pulmonary/Chest: Effort normal and breath sounds normal.  Lymphadenopathy:    She has no cervical adenopathy.  Neurological: She is alert and oriented to person, place, and time.  Skin: Skin is warm and dry. She is not diaphoretic.  Psychiatric: She has a normal mood and affect. Her behavior is normal. Judgment and thought content normal.  Vitals reviewed.     BP 94/66 (BP Location: Right Arm, Patient Position: Sitting, Cuff Size: Normal)   Pulse 73   Temp 98.4 F (36.9 C) (Oral)   Wt 133 lb (60.3 kg)  SpO2 100%   BMI 23.94 kg/m  Wt Readings from Last 3 Encounters:  10/06/17 133 lb (60.3 kg)  05/09/17 133 lb (60.3 kg)  04/30/17 132 lb 12.8 oz (60.2 kg)   BP Readings from Last 3 Encounters:  10/06/17 94/66  05/09/17 (!) 90/58  04/30/17 98/62       Assessment & Plan:  1. Viral URI with cough - Provided written and verbal information regarding diagnosis and treatment. -  Patient Instructions  Good to see you today, I think you have a viral upper respiratory infection  Continue mucinex and salt water gargles.  I have sent in a cough syrup to your pharmacy  For headache, muscle aces, can take ibuprofen 2 tablets every 8 to 12 hours as needed  If not better in 5-7 days, please  follow up     Clarene Reamer, FNP-BC  [facility] [place]  10/08/2017 7:52 AM

## 2017-10-06 NOTE — Patient Instructions (Signed)
Good to see you today, I think you have a viral upper respiratory infection  Continue mucinex and salt water gargles.  I have sent in a cough syrup to your pharmacy  For headache, muscle aces, can take ibuprofen 2 tablets every 8 to 12 hours as needed  If not better in 5-7 days, please follow up

## 2017-10-07 ENCOUNTER — Telehealth: Payer: Self-pay | Admitting: Family Medicine

## 2017-10-07 ENCOUNTER — Other Ambulatory Visit: Payer: Self-pay | Admitting: Family Medicine

## 2017-10-07 MED ORDER — GUAIFENESIN-CODEINE 100-10 MG/5ML PO SYRP: 5.0000 mL | ORAL_SOLUTION | Freq: Three times a day (TID) | ORAL | 0 refills | Status: DC | PRN

## 2017-10-07 NOTE — Telephone Encounter (Signed)
[name] from [facility] calling to inform Pam Salazar that the promethazine-codine syrup is on back order and wanted to see if there was an alternative prescribed. Please advise. CB#: [phone]

## 2017-10-07 NOTE — Telephone Encounter (Signed)
Please call pharmacy and tell them I sent in different cough medicine.

## 2017-10-07 NOTE — Telephone Encounter (Signed)
Copied from [place] [id number]. Topic: Quick Communication - See Telephone Encounter >> Oct 07, 2017  2:38 PM Aurelio Brash B wrote: CRM for notification. See Telephone encounter for: 10/07/17. Ronalee Belts from [facility] called to say promethazine-codeine ([place]) 6.25-10 MG/5ML syrup is on back order and requesting a different rx, his contact number is  [phone]    PT was prescribed this by Tor Netters ,  Clicked Dr Caryl Bis,  her pcp,

## 2017-10-08 ENCOUNTER — Encounter: Payer: Self-pay | Admitting: Family Medicine

## 2017-10-08 NOTE — Telephone Encounter (Signed)
Pharmacy has made the changes nothing further needed at this time.

## 2017-10-10 DIAGNOSIS — B9689 Other specified bacterial agents as the cause of diseases classified elsewhere: Secondary | ICD-10-CM | POA: Diagnosis not present

## 2017-10-10 DIAGNOSIS — B373 Candidiasis of vulva and vagina: Secondary | ICD-10-CM | POA: Diagnosis not present

## 2017-10-10 DIAGNOSIS — J302 Other seasonal allergic rhinitis: Secondary | ICD-10-CM | POA: Diagnosis not present

## 2017-10-10 DIAGNOSIS — J019 Acute sinusitis, unspecified: Secondary | ICD-10-CM | POA: Diagnosis not present

## 2017-10-22 ENCOUNTER — Ambulatory Visit (INDEPENDENT_AMBULATORY_CARE_PROVIDER_SITE_OTHER): Payer: Medicare Other | Admitting: Family Medicine

## 2017-10-22 ENCOUNTER — Other Ambulatory Visit: Payer: Self-pay

## 2017-10-22 ENCOUNTER — Encounter: Payer: Self-pay | Admitting: Family Medicine

## 2017-10-22 ENCOUNTER — Encounter: Payer: Self-pay | Admitting: General Surgery

## 2017-10-22 VITALS — BP 90/58 | HR 79 | Temp 97.8°F | Ht 63.0 in | Wt 133.0 lb

## 2017-10-22 DIAGNOSIS — Z961 Presence of intraocular lens: Secondary | ICD-10-CM | POA: Diagnosis not present

## 2017-10-22 DIAGNOSIS — F329 Major depressive disorder, single episode, unspecified: Secondary | ICD-10-CM | POA: Diagnosis not present

## 2017-10-22 DIAGNOSIS — E039 Hypothyroidism, unspecified: Secondary | ICD-10-CM

## 2017-10-22 DIAGNOSIS — F419 Anxiety disorder, unspecified: Secondary | ICD-10-CM

## 2017-10-22 DIAGNOSIS — H353131 Nonexudative age-related macular degeneration, bilateral, early dry stage: Secondary | ICD-10-CM | POA: Diagnosis not present

## 2017-10-22 DIAGNOSIS — S8991XA Unspecified injury of right lower leg, initial encounter: Secondary | ICD-10-CM

## 2017-10-22 DIAGNOSIS — M7989 Other specified soft tissue disorders: Secondary | ICD-10-CM

## 2017-10-22 MED ORDER — SERTRALINE HCL 100 MG PO TABS
100.0000 mg | ORAL_TABLET | Freq: Every day | ORAL | 1 refills | Status: DC
Start: 2017-10-22 — End: 2018-04-20

## 2017-10-22 NOTE — Assessment & Plan Note (Signed)
Patient with anxiety and depression.  More anxiety at this point.  I believe this is contributing to her insomnia.  Discussed increasing Zoloft to 100 mg daily.  She will continue clonazepam.  She will stop the gabapentin that is not her prescription.  I offered to provide her medication to cover for the next week for her to take it every other day though she opted against me prescribing anything and she will use her husband's 300 mg tablets every other night for 1 week and then discontinue.  Given return precautions.

## 2017-10-22 NOTE — Assessment & Plan Note (Signed)
Stable Continue Synthroid 

## 2017-10-22 NOTE — Assessment & Plan Note (Signed)
Slightly more prominent now.  Discussed referral to general surgery to consider removal.  Referral placed.

## 2017-10-22 NOTE — Patient Instructions (Signed)
Nice to see you. We will increase her Zoloft to 100 mg daily.  Treating her anxiety will help with your sleep. We will get you referred to surgery for the soft tissue lesion in your leg. If you develop thoughts of harming yourself or anyone else please go to the emergency room.
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
## 2017-10-28 ENCOUNTER — Ambulatory Visit: Payer: Self-pay | Admitting: Family Medicine

## 2017-11-12 ENCOUNTER — Other Ambulatory Visit: Payer: Self-pay | Admitting: Family Medicine

## 2017-11-12 MED ORDER — CLONAZEPAM 1 MG PO TABS: 1.0000 mg | ORAL_TABLET | Freq: Two times a day (BID) | ORAL | 2 refills | Status: DC | PRN

## 2017-11-12 NOTE — Telephone Encounter (Signed)
Refill request for Klonopin, last filled on 08/14/17 #60 tab with 2 refills. Pt will be out of medication on Friday 5/24.   LOV: 10/22/17 Dr. Brayton Layman on [facility]

## 2017-11-12 NOTE — Telephone Encounter (Signed)
Please advise 

## 2017-11-12 NOTE — Telephone Encounter (Signed)
Copied from [place] [phone]. Topic: Quick Communication - See Telephone Encounter >> Nov 12, 2017 11:30 AM Ivar Drape wrote: CRM for notification. See Telephone encounter for: 11/12/17. Patient would like a refill on her clonazePAM (KLONOPIN) 1 MG tablet medication.  Please send it to her preferred pharmacy [facility] on [facility]. Patient will be out of the medication on Friday, May 24th.

## 2017-11-25 ENCOUNTER — Other Ambulatory Visit: Payer: Self-pay | Admitting: Family Medicine

## 2017-11-25 DIAGNOSIS — Z1231 Encounter for screening mammogram for malignant neoplasm of breast: Secondary | ICD-10-CM

## 2017-11-27 ENCOUNTER — Other Ambulatory Visit: Payer: Self-pay

## 2017-11-27 ENCOUNTER — Encounter: Payer: Self-pay | Admitting: General Surgery

## 2017-11-27 ENCOUNTER — Ambulatory Visit (INDEPENDENT_AMBULATORY_CARE_PROVIDER_SITE_OTHER): Payer: Medicare Other | Admitting: General Surgery

## 2017-11-27 VITALS — BP 100/72 | HR 78 | Resp 14 | Ht 62.5 in | Wt 130.0 lb

## 2017-11-27 DIAGNOSIS — I8391 Asymptomatic varicose veins of right lower extremity: Secondary | ICD-10-CM | POA: Diagnosis not present

## 2017-11-27 NOTE — Patient Instructions (Signed)
Follow up appointment to be announced. The patient is aware to call back for any questions or concerns. 

## 2017-11-27 NOTE — Progress Notes (Signed)
Patient ID: Pam Salazar, female   DOB: [birth year], 72 y.o.   MRN: [id number]  Chief Complaint  Patient presents with  . Mass    right lower leg    HPI Pam Salazar is a 72 y.o. female here today for a evalaution of a right lower leg fluid filled sacks. She has had this for for about 3 years. It started changing about 6 month becoming blue in color and bunching up together. She reports no pain with the area and no leg swelling or problems with circulation. Mother was Jonnie Kind. HPI  Past Medical History:  Diagnosis Date  . Arthritis    hips, lower back  . Breast lump 10/21/2011   Overview:  Negative diagnostic mammogram 09/2011   . Depression   . Fracture of rib of right side 08/17/2013  . Hypercholesterolemia   . Hypothyroidism   . Low blood pressure 10/25/2014  . Panic disorder    previous agoraphobia  . Seizures ([facility])    febrile - as infant  . Shoulder pain, left    bursa  . Vertigo    positional - rare  . Vitamin D deficiency     Past Surgical History:  Procedure Laterality Date  . ABDOMINAL HYSTERECTOMY  1981   partial - secondary to bleeding, ovaries not removed -   . CATARACT EXTRACTION W/ INTRAOCULAR LENS  IMPLANT, BILATERAL    . COLONOSCOPY WITH PROPOFOL N/A 05/05/2015   Procedure: COLONOSCOPY WITH PROPOFOL;  Surgeon: Lucilla Lame, MD;  Location: [facility];  Service: Endoscopy;  Laterality: N/A;  . ORIF FIBULA FRACTURE Right 2003   right lower extremitiy  . SHOULDER SURGERY  12/2015  . TONSILLECTOMY  1974    Family History  Problem Relation Age of Onset  . Arthritis Mother   . Hyperlipidemia Mother   . Arthritis Father   . Prostate cancer Father   . Breast cancer Sister 49  . Heart disease Brother        heart attack  . Diabetes Brother   . Breast cancer Maternal Aunt 85  . Colon cancer Maternal Uncle     Social History Social History   Tobacco Use  . Smoking status: Former Smoker    Types: Cigarettes  . Smokeless tobacco: Former Systems developer     Quit date: 07/26/2013  . Tobacco comment:    Substance Use Topics  . Alcohol use: No    Alcohol/week: 0.0 oz  . Drug use: No    Allergies  Allergen Reactions  . Duloxetine Hcl Other (See Comments)    depression worsened  . Ezetimibe-Simvastatin Other (See Comments)    Joint pain    Current Outpatient Medications  Medication Sig Dispense Refill  . clonazePAM (KLONOPIN) 1 MG tablet Take 1 tablet (1 mg total) by mouth 2 (two) times daily as needed. for anxiety 60 tablet 2  . Multiple Vitamins-Minerals (PRESERVISION AREDS 2 PO) Take by mouth.    . sertraline (ZOLOFT) 100 MG tablet Take 1 tablet (100 mg total) by mouth daily. 90 tablet 1  . simvastatin (ZOCOR) 20 MG tablet TAKE 1 TABLET BY MOUTH EVERY EVENING 90 tablet 0  . diphenhydramine-acetaminophen (TYLENOL PM) 25-500 MG TABS tablet Take 1 tablet at bedtime as needed by mouth (takes every night).     No current facility-administered medications for this visit.     Review of Systems Review of Systems  Constitutional: Negative.   Respiratory: Negative.   Cardiovascular: Negative.     Blood pressure  100/72, pulse 78, resp. rate 14, height 5' 2.5" (1.588 m), weight 130 lb (59 kg).  Physical Exam Physical Exam  Constitutional: She is oriented to person, place, and time. She appears well-developed and well-nourished.  Cardiovascular: Normal rate, regular rhythm and normal heart sounds.  Pulmonary/Chest: Effort normal and breath sounds normal.  Musculoskeletal:       Legs: Lower extremity show palpable DP and PT pulses, 2+.  No peripheral edema.  No evidence of visual asymmetry.  No palpable cords.  Neurological: She is alert and oriented to person, place, and time.  Skin: Skin is warm and dry.  Psychiatric: She has a normal mood and affect.    Data Reviewed Musculoskeletal ultrasound completed April 13, 2014 by Hulan Saas, DO as part of a lower extremity evaluation in part reported: Patient's right leg the  superficial region just above the dermis has what appears to be a epidermal cyst with some calcific surrounding sac. Only fluid within it that is compressible. No blood flow noted. No mass noted.  Ultrasound examination of the right lower extremity in the area of palpable thickening showed 3 distinct, although apparently connected nodules with evidence of a vein superior to these lesions.  All these areas are noncompressible.  Hypoechoic with posterior acoustic enhancement.  They range in diameter from the inferior aspect of 1.1 x 1.3 x 3.62 cm, mid level 1.0 x 1.3 x 1.5 cm and the most superior nodule measuring 0.9 x 0.93 x 1.1 cm.  These are above the level of the muscular fascia and below the dermis.  The appearance is that of a thrombosed superficial vein.   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    Plan  The area could be excised, but will investigate with vascular whether on noninvasive or less invasive opportunity is present.  The major change over the past several years is at the areas have become firm rather than soft.  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  The patient was concerned about the possibility of embolization.  Considering the small caliber of the outflow vein I think this is very unlikely.   Follow up appointment to be announced. The patient is aware to call back for any questions or concerns.    HPI, Physical Exam, Assessment and Plan have been scribed under the direction and in the presence of Robert Bellow, MD  Concepcion Living, LPN  I have completed the exam and reviewed the above documentation for accuracy and completeness.  I agree with the above.  Haematologist has been used and any errors in dictation or transcription are unintentional.  Hervey Ard, M.D., F.A.C.S.  Forest Gleason Gurshaan Matsuoka 11/29/2017, 8:17 AM

## 2017-11-29 DIAGNOSIS — I8391 Asymptomatic varicose veins of right lower extremity: Secondary | ICD-10-CM | POA: Insufficient documentation

## 2017-11-30 ENCOUNTER — Encounter: Payer: Self-pay | Admitting: Family Medicine

## 2017-12-03 ENCOUNTER — Telehealth: Payer: Self-pay

## 2017-12-03 ENCOUNTER — Other Ambulatory Visit: Payer: Self-pay

## 2017-12-03 DIAGNOSIS — I8391 Asymptomatic varicose veins of right lower extremity: Secondary | ICD-10-CM

## 2017-12-03 NOTE — Telephone Encounter (Signed)
-----   Message from Robert Bellow, MD sent at 12/02/2017  3:35 PM EDT ----- Please arrange referral to Ella Jubilee, MD at [facility] regarding this patient.  Notify the patient that I discussed the case with Dr. Delana Meyer and he may have a less invasive way to manage the problem.  Thank you ----- Message ----- From: Augustin Schooling, CMA Sent: 12/02/2017   8:30 AM To: Robert Bellow, MD

## 2017-12-03 NOTE — Telephone Encounter (Signed)
Patient notified and is amendable to this. Contact for [facility] given to the patient. They should be in contact with her to schedule this.

## 2017-12-05 ENCOUNTER — Other Ambulatory Visit: Payer: Self-pay | Admitting: Family Medicine

## 2017-12-08 DIAGNOSIS — Z86018 Personal history of other benign neoplasm: Secondary | ICD-10-CM | POA: Diagnosis not present

## 2017-12-08 DIAGNOSIS — Z859 Personal history of malignant neoplasm, unspecified: Secondary | ICD-10-CM | POA: Diagnosis not present

## 2017-12-08 DIAGNOSIS — L578 Other skin changes due to chronic exposure to nonionizing radiation: Secondary | ICD-10-CM | POA: Diagnosis not present

## 2017-12-08 DIAGNOSIS — L821 Other seborrheic keratosis: Secondary | ICD-10-CM | POA: Diagnosis not present

## 2017-12-08 DIAGNOSIS — Z1283 Encounter for screening for malignant neoplasm of skin: Secondary | ICD-10-CM | POA: Diagnosis not present

## 2017-12-08 DIAGNOSIS — Z85828 Personal history of other malignant neoplasm of skin: Secondary | ICD-10-CM | POA: Diagnosis not present

## 2017-12-08 DIAGNOSIS — Z872 Personal history of diseases of the skin and subcutaneous tissue: Secondary | ICD-10-CM | POA: Diagnosis not present

## 2017-12-12 ENCOUNTER — Ambulatory Visit
Admission: RE | Admit: 2017-12-12 | Discharge: 2017-12-12 | Disposition: A | Discharge status: Home / Self Care | Payer: Medicare Other | Source: Ambulatory Visit | Attending: Family Medicine | Admitting: Family Medicine

## 2017-12-12 DIAGNOSIS — Z1231 Encounter for screening mammogram for malignant neoplasm of breast: Secondary | ICD-10-CM

## 2017-12-18 ENCOUNTER — Encounter (INDEPENDENT_AMBULATORY_CARE_PROVIDER_SITE_OTHER): Payer: Self-pay

## 2017-12-18 ENCOUNTER — Encounter (INDEPENDENT_AMBULATORY_CARE_PROVIDER_SITE_OTHER): Payer: Self-pay | Admitting: Vascular Surgery

## 2017-12-18 ENCOUNTER — Ambulatory Visit (INDEPENDENT_AMBULATORY_CARE_PROVIDER_SITE_OTHER): Payer: Medicare Other | Admitting: Vascular Surgery

## 2017-12-18 VITALS — BP 85/59 | HR 81 | Resp 16 | Ht 62.5 in | Wt 131.8 lb

## 2017-12-18 DIAGNOSIS — I83811 Varicose veins of right lower extremities with pain: Secondary | ICD-10-CM | POA: Diagnosis not present

## 2017-12-18 DIAGNOSIS — E7800 Pure hypercholesterolemia, unspecified: Secondary | ICD-10-CM | POA: Diagnosis not present

## 2017-12-18 DIAGNOSIS — I8001 Phlebitis and thrombophlebitis of superficial vessels of right lower extremity: Secondary | ICD-10-CM | POA: Diagnosis not present

## 2017-12-18 DIAGNOSIS — I809 Phlebitis and thrombophlebitis of unspecified site: Secondary | ICD-10-CM | POA: Insufficient documentation

## 2017-12-18 NOTE — Progress Notes (Signed)
MRN : [id number]  Pam Salazar is a 72 y.o. ([birth year]) female who presents with chief complaint of  Chief Complaint  Patient presents with  . New Patient (Initial Visit)    ref Tollie Pizza for Varicose Veins  .  History of Present Illness: The patient is seen for evaluation of symptomatic varicose veins. The patient relates an area in her right lateral shin that became hard and painful.  Initially she was told it was a cyst but this didn't seem right and so she saw Dr Bary Castilla who felt it was phlebitis and referred her to me.  There is no history of DVT or PE  There is no history of ulceration or hemorrhage. The patient denies a significant family history of varicose veins.  The patient has not worn graduated compression in the past. At the present time the patient has not been using over-the-counter analgesics. There is no history of prior surgical intervention or sclerotherapy.    Current Meds  Medication Sig  . clonazePAM (KLONOPIN) 1 MG tablet Take 1 tablet (1 mg total) by mouth 2 (two) times daily as needed. for anxiety  . diphenhydramine-acetaminophen (TYLENOL PM) 25-500 MG TABS tablet Take 1 tablet by mouth at bedtime.   Marland Kitchen levothyroxine (SYNTHROID, LEVOTHROID) 100 MCG tablet Take 100 mcg by mouth daily before breakfast.   . Multiple Vitamins-Minerals (PRESERVISION AREDS 2 PO) Take 1 capsule by mouth 2 (two) times daily.   . sertraline (ZOLOFT) 100 MG tablet Take 1 tablet (100 mg total) by mouth daily.  . simvastatin (ZOCOR) 20 MG tablet TAKE 1 TABLET BY MOUTH EVERY EVENING    Past Medical History:  Diagnosis Date  . Arthritis    hips, lower back  . Breast lump 10/21/2011   Overview:  Negative diagnostic mammogram 09/2011   . Depression   . Fracture of rib of right side 08/17/2013  . Hypercholesterolemia   . Hypothyroidism   . Low blood pressure 10/25/2014  . Panic disorder    previous agoraphobia  . Seizures (HCC)    febrile - as infant  . Shoulder pain, left    bursa    . Vertigo    positional - rare  . Vitamin D deficiency     Past Surgical History:  Procedure Laterality Date  . ABDOMINAL HYSTERECTOMY  1981   partial - secondary to bleeding, ovaries not removed -   . CATARACT EXTRACTION W/ INTRAOCULAR LENS  IMPLANT, BILATERAL    . COLONOSCOPY WITH PROPOFOL N/A 05/05/2015   Procedure: COLONOSCOPY WITH PROPOFOL;  Surgeon: Lucilla Lame, MD;  Location: [place];  Service: Endoscopy;  Laterality: N/A;  . ORIF FIBULA FRACTURE Right 2003   right lower extremitiy  . SHOULDER SURGERY  12/2015  . TONSILLECTOMY  1974    Social History Social History   Tobacco Use  . Smoking status: Former Smoker    Types: Cigarettes  . Smokeless tobacco: Former Systems developer    Quit date: 07/26/2013  . Tobacco comment:    Substance Use Topics  . Alcohol use: No    Alcohol/week: 0.0 oz  . Drug use: No    Family History Family History  Problem Relation Age of Onset  . Arthritis Mother   . Hyperlipidemia Mother   . Arthritis Father   . Prostate cancer Father   . Breast cancer Sister 64  . Heart disease Brother        heart attack  . Diabetes Brother   . Breast cancer Maternal Aunt  29  . Colon cancer Maternal Uncle   No family history of bleeding/clotting disorders, porphyria or autoimmune disease   Allergies  Allergen Reactions  . Duloxetine Hcl Other (See Comments)    depression worsened  . Ezetimibe-Simvastatin Other (See Comments)    Joint pain     REVIEW OF SYSTEMS (Negative unless checked)  Constitutional: [] Weight loss  [] Fever  [] Chills Cardiac: [] Chest pain   [] Chest pressure   [] Palpitations   [] Shortness of breath when laying flat   [] Shortness of breath with exertion. Vascular:  [] Pain in legs with walking   [x] Pain in legs at rest  [] History of DVT   [] Phlebitis   [x] Swelling in legs   [] Varicose veins   [] Non-healing ulcers Pulmonary:   [] Uses home oxygen   [] Productive cough   [] Hemoptysis   [] Wheeze  [] COPD   [] Asthma Neurologic:   [] Dizziness   [] Seizures   [] History of stroke   [] History of TIA  [] Aphasia   [] Vissual changes   [] Weakness or numbness in arm   [] Weakness or numbness in leg Musculoskeletal:   [] Joint swelling   [] Joint pain   [] Low back pain Hematologic:  [] Easy bruising  [] Easy bleeding   [] Hypercoagulable state   [] Anemic Gastrointestinal:  [] Diarrhea   [] Vomiting  [] Gastroesophageal reflux/heartburn   [] Difficulty swallowing. Genitourinary:  [] Chronic kidney disease   [] Difficult urination  [] Frequent urination   [] Blood in urine Skin:  [] Rashes   [] Ulcers  Psychological:  [] History of anxiety   []  History of major depression.  Physical Examination  Vitals:   12/18/17 1328  BP: (!) 85/59  Pulse: 81  Resp: 16  Weight: 131 lb 12.8 oz (59.8 kg)  Height: 5' 2.5" (1.588 m)   Body mass index is 23.72 kg/m. Gen: WD/WN, NAD Head: [place]/AT, No temporalis wasting.  Ear/Nose/Throat: Hearing grossly intact, nares w/o erythema or drainage, poor dentition Eyes: PER, EOMI, sclera nonicteric.  Neck: Supple, no masses.  No bruit or JVD.  Pulmonary:  Good air movement, clear to auscultation bilaterally, no use of accessory muscles.  Cardiac: RRR, normal S1, S2, no Murmurs. Vascular: Large varicosities present extensively greater than 5+ mm right lower extremity.  Mild venous stasis changes to the legs bilaterally.  1+ soft pitting edema Vessel Right Left  Radial Palpable Palpable  PT Palpable Palpable  DP Palpable Palpable  Gastrointestinal: soft, non-distended. No guarding/no peritoneal signs.  Musculoskeletal: M/S 5/5 throughout.  No deformity or atrophy.  Neurologic: CN 2-12 intact. Pain and light touch intact in extremities.  Symmetrical.  Speech is fluent. Motor exam as listed above. Psychiatric: Judgment intact, Mood & affect appropriate for pt's clinical situation. Dermatologic: No rashes or ulcers noted.  No changes consistent with cellulitis. Lymph : No Cervical lymphadenopathy, no lichenification or  skin changes of chronic lymphedema.  CBC Lab Results  Component Value Date   WBC 5.7 04/10/2017   HGB 14.2 04/10/2017   HCT 42.3 04/10/2017   MCV 88.6 04/10/2017   PLT 199 04/10/2017    BMET    Component Value Date/Time   NA 140 07/30/2017 0950   K 4.1 07/30/2017 0950   CL 105 07/30/2017 0950   CO2 26 07/30/2017 0950   GLUCOSE 89 07/30/2017 0950   BUN 23 07/30/2017 0950   CREATININE 0.73 07/30/2017 0950   CREATININE 0.87 09/23/2016 1535   CALCIUM 9.5 07/30/2017 0950   GFRNONAA >60 04/10/2017 1203   GFRAA >60 04/10/2017 1203   CrCl cannot be calculated (Patient's most recent lab result is older  than the maximum 21 days allowed.).  COAG No results found for: INR, PROTIME  Radiology Mm 3d Screen Breast Bilateral  Result Date: 12/12/2017 CLINICAL DATA:  Screening. EXAM: DIGITAL SCREENING BILATERAL MAMMOGRAM WITH TOMO AND CAD COMPARISON:  Previous exam(s). ACR Breast Density Category b: There are scattered areas of fibroglandular density. FINDINGS: There are no findings suspicious for malignancy. Images were processed with CAD. IMPRESSION: No mammographic evidence of malignancy. A result letter of this screening mammogram will be mailed directly to the patient. RECOMMENDATION: Screening mammogram in one year. (Code:SM-B-01Y) BI-RADS CATEGORY  1: Negative. Electronically Signed   By: Abelardo Diesel M.D.   On: 12/12/2017 15:00     Assessment/Plan 1. 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  I recommend that she have an excision to relieve her pain and expedite healing.  Risk and benefits were reviewed the patient.  Indications for the procedure were reviewed.  All questions were answered, the patient agrees to proceed.   2. Varicose veins of leg with pain, right  Recommend:  The patient has large symptomatic varicose veins that are painful and associated with swelling.  To treat her STP  see #1  I have had a long discussion with the patient regarding  varicose veins and why they cause symptoms.  Patient will begin wearing graduated compression stockings class 1 on a daily basis, beginning first thing in the morning and removing them in the evening. The patient is instructed specifically not to sleep in the stockings.    The patient  will also begin using over-the-counter analgesics such as Motrin 600 mg po TID to help control the symptoms.    In addition, behavioral modification including elevation during the day will be initiated.    Pending the results of these changes the  patient will be reevaluated in three months.   An  ultrasound of the venous system will be obtained.   Further plans will be based on the ultrasound results and whether conservative therapies are successful at eliminating the pain and swelling.   3. Hypercholesterolemia Continue statin as ordered and reviewed, no changes at this time     Hortencia Pilar, MD  12/18/2017 9:25 PM

## 2017-12-22 ENCOUNTER — Other Ambulatory Visit: Payer: Self-pay

## 2017-12-22 ENCOUNTER — Other Ambulatory Visit (INDEPENDENT_AMBULATORY_CARE_PROVIDER_SITE_OTHER): Payer: Self-pay

## 2017-12-22 ENCOUNTER — Encounter
Admission: RE | Admit: 2017-12-22 | Discharge: 2017-12-22 | Disposition: A | Discharge status: Home / Self Care | Payer: Medicare Other | Source: Ambulatory Visit | Attending: Vascular Surgery | Admitting: Vascular Surgery

## 2017-12-22 ENCOUNTER — Other Ambulatory Visit (INDEPENDENT_AMBULATORY_CARE_PROVIDER_SITE_OTHER): Payer: Self-pay | Admitting: Vascular Surgery

## 2017-12-22 ENCOUNTER — Ambulatory Visit: Payer: Self-pay | Admitting: Family Medicine

## 2017-12-22 DIAGNOSIS — E039 Hypothyroidism, unspecified: Secondary | ICD-10-CM | POA: Diagnosis not present

## 2017-12-22 DIAGNOSIS — E7800 Pure hypercholesterolemia, unspecified: Secondary | ICD-10-CM | POA: Diagnosis not present

## 2017-12-22 DIAGNOSIS — Z87891 Personal history of nicotine dependence: Secondary | ICD-10-CM | POA: Diagnosis not present

## 2017-12-22 DIAGNOSIS — F329 Major depressive disorder, single episode, unspecified: Secondary | ICD-10-CM | POA: Diagnosis not present

## 2017-12-22 DIAGNOSIS — Z01810 Encounter for preprocedural cardiovascular examination: Secondary | ICD-10-CM | POA: Diagnosis not present

## 2017-12-22 DIAGNOSIS — F419 Anxiety disorder, unspecified: Secondary | ICD-10-CM | POA: Diagnosis not present

## 2017-12-22 DIAGNOSIS — I803 Phlebitis and thrombophlebitis of lower extremities, unspecified: Secondary | ICD-10-CM | POA: Diagnosis not present

## 2017-12-22 DIAGNOSIS — I739 Peripheral vascular disease, unspecified: Secondary | ICD-10-CM | POA: Diagnosis not present

## 2017-12-22 DIAGNOSIS — M1990 Unspecified osteoarthritis, unspecified site: Secondary | ICD-10-CM | POA: Diagnosis not present

## 2017-12-22 DIAGNOSIS — M6748 Ganglion, other site: Secondary | ICD-10-CM | POA: Diagnosis not present

## 2017-12-22 DIAGNOSIS — Z888 Allergy status to other drugs, medicaments and biological substances status: Secondary | ICD-10-CM | POA: Diagnosis not present

## 2017-12-22 DIAGNOSIS — Z79899 Other long term (current) drug therapy: Secondary | ICD-10-CM | POA: Diagnosis not present

## 2017-12-22 LAB — PROTIME-INR
INR: 0.96
PROTHROMBIN TIME: 12.7 s (ref 11.4–15.2)

## 2017-12-22 LAB — CBC
HCT: 39.6 % (ref 35.0–47.0)
HEMOGLOBIN: 13.6 g/dL (ref 12.0–16.0)
MCH: 30.3 pg (ref 26.0–34.0)
MCHC: 34.3 g/dL (ref 32.0–36.0)
MCV: 88.5 fL (ref 80.0–100.0)
Platelets: 185 10*3/uL (ref 150–440)
RBC: 4.48 MIL/uL (ref 3.80–5.20)
RDW: 14 % (ref 11.5–14.5)
WBC: 5.2 10*3/uL (ref 3.6–11.0)

## 2017-12-22 LAB — APTT: APTT: 32 s (ref 24–36)

## 2017-12-22 NOTE — Patient Instructions (Addendum)
Your procedure is scheduled on: 12/24/17 Wed Report to Same Day Surgery 2nd floor medical mall [facility] Entrance-take elevator on left to 2nd floor.  Check in with surgery information desk.) To find out your arrival time please call [phone] between 1PM - 3PM on 12/23/17 Tues  Remember: Instructions that are not followed completely may result in serious medical risk, up to and including death, or upon the discretion of your surgeon and anesthesiologist your surgery may need to be rescheduled.    _x___ 1. Do not eat food after midnight the night before your procedure. You may drink clear liquids up to 2 hours before you are scheduled to arrive at the hospital for your procedure.  Do not drink clear liquids within 2 hours of your scheduled arrival to the hospital.  Clear liquids include  --Water or Apple juice without pulp  --Clear carbohydrate beverage such as ClearFast or Gatorade  --Black Coffee or Clear Tea (No milk, no creamers, do not add anything to                  the coffee or Tea Type 1 and type 2 diabetics should only drink water.  No gum chewing or hard candies.     __x__ 2. No Alcohol for 24 hours before or after surgery.   __x__3. No Smoking or e-cigarettes for 24 prior to surgery.  Do not use any chewable tobacco products for at least 6 hour prior to surgery   ____  4. Bring all medications with you on the day of surgery if instructed.    __x__ 5. Notify your doctor if there is any change in your medical condition     (cold, fever, infections).    x___6. On the morning of surgery brush your teeth with toothpaste and water.  You may rinse your mouth with mouth wash if you wish.  Do not swallow any toothpaste or mouthwash.   Do not wear jewelry, make-up, hairpins, clips or nail polish.  Do not wear lotions, powders, or perfumes. You may wear deodorant.  Do not shave 48 hours prior to surgery. Men may shave face and neck.  Do not bring valuables to the hospital.     [facility] is not responsible for any belongings or valuables.               Contacts, dentures or bridgework may not be worn into surgery.  Leave your suitcase in the car. After surgery it may be brought to your room.  For patients admitted to the hospital, discharge time is determined by your                       treatment team.  _  Patients discharged the day of surgery will not be allowed to drive home.  You will need someone to drive you home and stay with you the night of your procedure.    Please read over the following fact sheets that you were given:   [facility] Preparing for Surgery and or MRSA Information   _x___ Take anti-hypertensive listed below, cardiac, seizure, asthma,     anti-reflux and psychiatric medicines. These include:  1. clonazePAM (KLONOPIN) 1 MG tablet  2.levothyroxine (SYNTHROID, LEVOTHROID) 100 MCG tablet  3.sertraline (ZOLOFT) 100 MG tablet  4.  5.  6.  ____Fleets enema or Magnesium Citrate as directed.   _x___ Use CHG Soap or sage wipes as directed on instruction sheet   ____ Use inhalers  on the day of surgery and bring to hospital day of surgery  ____ Stop Metformin and Janumet 2 days prior to surgery.    ____ Take 1/2 of usual insulin dose the night before surgery and none on the morning     surgery.   _x___ Follow recommendations from Cardiologist, Pulmonologist or PCP regarding          stopping Aspirin, Coumadin, Plavix ,Eliquis, Effient, or Pradaxa, and Pletal.  X____Stop Anti-inflammatories such as Advil, Aleve, Ibuprofen, Motrin, Naproxen, Naprosyn, Goodies powders or aspirin products. OK to take Tylenol and                          Celebrex.   _x___ Stop supplements until after surgery.  But may continue Vitamin D, Vitamin B,       and multivitamin.   ____ Bring C-Pap to the hospital.

## 2017-12-23 MED ORDER — CEFAZOLIN SODIUM-DEXTROSE 1-4 GM/50ML-% IV SOLN
1.0000 g | Freq: Once | INTRAVENOUS | Status: AC
  Administered 2017-12-24: 1 g via INTRAVENOUS

## 2017-12-24 ENCOUNTER — Encounter: Payer: Self-pay | Admitting: *Deleted

## 2017-12-24 ENCOUNTER — Ambulatory Visit
Admission: RE | Admit: 2017-12-24 | Discharge: 2017-12-24 | Disposition: A | Discharge status: Home / Self Care | Payer: Medicare Other | Source: Ambulatory Visit | Attending: Vascular Surgery | Admitting: Vascular Surgery

## 2017-12-24 ENCOUNTER — Ambulatory Visit: Admit: 2017-12-24 | Payer: PRIVATE HEALTH INSURANCE | Admitting: Vascular Surgery

## 2017-12-24 ENCOUNTER — Encounter
Admission: RE | Disposition: A | Discharge status: Home / Self Care | Payer: Self-pay | Source: Ambulatory Visit | Attending: Vascular Surgery

## 2017-12-24 ENCOUNTER — Ambulatory Visit: Payer: Medicare Other | Admitting: Anesthesiology

## 2017-12-24 DIAGNOSIS — I739 Peripheral vascular disease, unspecified: Secondary | ICD-10-CM | POA: Diagnosis not present

## 2017-12-24 DIAGNOSIS — Z888 Allergy status to other drugs, medicaments and biological substances status: Secondary | ICD-10-CM | POA: Insufficient documentation

## 2017-12-24 DIAGNOSIS — Z87891 Personal history of nicotine dependence: Secondary | ICD-10-CM | POA: Insufficient documentation

## 2017-12-24 DIAGNOSIS — I809 Phlebitis and thrombophlebitis of unspecified site: Secondary | ICD-10-CM | POA: Diagnosis not present

## 2017-12-24 DIAGNOSIS — F418 Other specified anxiety disorders: Secondary | ICD-10-CM | POA: Diagnosis not present

## 2017-12-24 DIAGNOSIS — E7800 Pure hypercholesterolemia, unspecified: Secondary | ICD-10-CM | POA: Diagnosis not present

## 2017-12-24 DIAGNOSIS — M1990 Unspecified osteoarthritis, unspecified site: Secondary | ICD-10-CM | POA: Insufficient documentation

## 2017-12-24 DIAGNOSIS — E039 Hypothyroidism, unspecified: Secondary | ICD-10-CM | POA: Diagnosis not present

## 2017-12-24 DIAGNOSIS — F419 Anxiety disorder, unspecified: Secondary | ICD-10-CM | POA: Insufficient documentation

## 2017-12-24 DIAGNOSIS — F329 Major depressive disorder, single episode, unspecified: Secondary | ICD-10-CM | POA: Diagnosis not present

## 2017-12-24 DIAGNOSIS — Z79899 Other long term (current) drug therapy: Secondary | ICD-10-CM | POA: Insufficient documentation

## 2017-12-24 DIAGNOSIS — I803 Phlebitis and thrombophlebitis of lower extremities, unspecified: Secondary | ICD-10-CM | POA: Diagnosis not present

## 2017-12-24 DIAGNOSIS — M6748 Ganglion, other site: Secondary | ICD-10-CM | POA: Diagnosis not present

## 2017-12-24 HISTORY — PX: MASS EXCISION: SHX2000

## 2017-12-24 LAB — ABO/RH: ABO/RH(D): O POS

## 2017-12-24 SURGERY — EXCISION MASS
Anesthesia: General | Laterality: Right | Wound class: Clean

## 2017-12-24 SURGERY — LOWER EXTREMITY ANGIOGRAPHY
Anesthesia: Moderate Sedation | Laterality: Right

## 2017-12-24 MED ORDER — FAMOTIDINE 20 MG PO TABS
ORAL_TABLET | ORAL | Status: AC
  Administered 2017-12-24: 20 mg
  Filled 2017-12-24: qty 1

## 2017-12-24 MED ORDER — OXYCODONE HCL 5 MG PO TABS: 5.0000 mg | ORAL_TABLET | Freq: Once | ORAL | Status: DC | PRN

## 2017-12-24 MED ORDER — CEFAZOLIN SODIUM-DEXTROSE 1-4 GM/50ML-% IV SOLN
INTRAVENOUS | Status: AC
  Filled 2017-12-24: qty 50

## 2017-12-24 MED ORDER — SODIUM CHLORIDE 0.9 % IV SOLN
INTRAVENOUS | Status: DC
  Administered 2017-12-24: 12:00:00 via INTRAVENOUS

## 2017-12-24 MED ORDER — MIDAZOLAM HCL 2 MG/2ML IJ SOLN
INTRAMUSCULAR | Status: DC | PRN
  Administered 2017-12-24: 2 mg via INTRAVENOUS

## 2017-12-24 MED ORDER — TRAMADOL HCL 50 MG PO TABS: 50.0000 mg | ORAL_TABLET | Freq: Four times a day (QID) | ORAL | 0 refills | Status: DC | PRN

## 2017-12-24 MED ORDER — LACTATED RINGERS IV SOLN: INTRAVENOUS | Status: DC

## 2017-12-24 MED ORDER — FENTANYL CITRATE (PF) 100 MCG/2ML IJ SOLN: 25.0000 ug | INTRAMUSCULAR | Status: DC | PRN

## 2017-12-24 MED ORDER — PROPOFOL 500 MG/50ML IV EMUL
INTRAVENOUS | Status: DC | PRN
Start: 2017-12-24 — End: 2017-12-24
  Administered 2017-12-24: 100 ug/kg/min via INTRAVENOUS

## 2017-12-24 MED ORDER — FENTANYL CITRATE (PF) 100 MCG/2ML IJ SOLN
INTRAMUSCULAR | Status: AC
  Filled 2017-12-24: qty 2

## 2017-12-24 MED ORDER — BUPIVACAINE-EPINEPHRINE (PF) 0.25% -1:200000 IJ SOLN
INTRAMUSCULAR | Status: AC
  Filled 2017-12-24: qty 30

## 2017-12-24 MED ORDER — LIDOCAINE HCL (PF) 2 % IJ SOLN
INTRAMUSCULAR | Status: AC
  Filled 2017-12-24: qty 10

## 2017-12-24 MED ORDER — MIDAZOLAM HCL 2 MG/2ML IJ SOLN
INTRAMUSCULAR | Status: AC
  Filled 2017-12-24: qty 2

## 2017-12-24 MED ORDER — FAMOTIDINE 20 MG PO TABS: 20.0000 mg | ORAL_TABLET | Freq: Once | ORAL | Status: DC

## 2017-12-24 MED ORDER — OXYCODONE HCL 5 MG/5ML PO SOLN: 5.0000 mg | Freq: Once | ORAL | Status: DC | PRN

## 2017-12-24 MED ORDER — PHENYLEPHRINE HCL 10 MG/ML IJ SOLN
INTRAMUSCULAR | Status: DC | PRN
  Administered 2017-12-24: 100 ug via INTRAVENOUS

## 2017-12-24 MED ORDER — PROPOFOL 500 MG/50ML IV EMUL
INTRAVENOUS | Status: AC
  Filled 2017-12-24: qty 50

## 2017-12-24 MED ORDER — FENTANYL CITRATE (PF) 100 MCG/2ML IJ SOLN
INTRAMUSCULAR | Status: DC | PRN
  Administered 2017-12-24 (×2): 25 ug via INTRAVENOUS

## 2017-12-24 MED ORDER — BUPIVACAINE-EPINEPHRINE (PF) 0.25% -1:200000 IJ SOLN
INTRAMUSCULAR | Status: DC | PRN
  Administered 2017-12-24: 13 mL via PERINEURAL

## 2017-12-24 SURGICAL SUPPLY — 23 items
ADH SKN CLS APL DERMABOND .7 (GAUZE/BANDAGES/DRESSINGS) ×1
BNDG COHESIVE 4X5 TAN STRL (GAUZE/BANDAGES/DRESSINGS) ×3 IMPLANT
CANISTER SUCT 1200ML W/VALVE (MISCELLANEOUS) ×3 IMPLANT
CHLORAPREP W/TINT 26ML (MISCELLANEOUS) ×3 IMPLANT
DERMABOND ADVANCED (GAUZE/BANDAGES/DRESSINGS) ×2
DERMABOND ADVANCED .7 DNX12 (GAUZE/BANDAGES/DRESSINGS) IMPLANT
DRSG TELFA 4X3 1S NADH ST (GAUZE/BANDAGES/DRESSINGS) ×3 IMPLANT
ELECT CAUTERY BLADE 6.4 (BLADE) ×3 IMPLANT
ELECT REM PT RETURN 9FT ADLT (ELECTROSURGICAL) ×3
ELECTRODE REM PT RTRN 9FT ADLT (ELECTROSURGICAL) ×1 IMPLANT
GAUZE SPONGE 4X4 12PLY STRL (GAUZE/BANDAGES/DRESSINGS) ×3 IMPLANT
GLOVE SURG SYN 8.0 (GLOVE) ×3 IMPLANT
GLOVE SURG SYN 8.0 PF PI (GLOVE) ×1 IMPLANT
GOWN STRL REUS W/ TWL XL LVL3 (GOWN DISPOSABLE) ×1 IMPLANT
GOWN STRL REUS W/TWL XL LVL3 (GOWN DISPOSABLE) ×3
KIT TURNOVER KIT A (KITS) ×3 IMPLANT
LABEL OR SOLS (LABEL) ×3 IMPLANT
NS IRRIG 500ML POUR BTL (IV SOLUTION) ×3 IMPLANT
PACK EXTREMITY ARMC (MISCELLANEOUS) ×3 IMPLANT
SOL PREP PVP 2OZ (MISCELLANEOUS) ×3
SOLUTION PREP PVP 2OZ (MISCELLANEOUS) ×1 IMPLANT
SPONGE LAP 18X18 RF (DISPOSABLE) ×3 IMPLANT
STOCKINETTE IMPERV 14X48 (MISCELLANEOUS) ×3 IMPLANT

## 2017-12-24 NOTE — Transfer of Care (Signed)
Immediate Anesthesia Transfer of Care Note  Patient: Pam Salazar  Procedure(s) Performed: EXCISION MASS ( SHIN ) (Right )  Patient Location: PACU  Anesthesia Type:General  Level of Consciousness: awake  Airway & Oxygen Therapy: Patient Spontanous Breathing  Post-op Assessment: Report given to RN and Post -op Vital signs reviewed and stable  Post vital signs: Reviewed and stable  Last Vitals:  Vitals Value Taken Time  BP 84/50 12/24/2017  1:15 PM  Temp 36.9 C 12/24/2017  1:15 PM  Pulse 67 12/24/2017  1:16 PM  Resp 16 12/24/2017  1:16 PM  SpO2 95 % 12/24/2017  1:16 PM  Vitals shown include unvalidated device data.  Last Pain:  Vitals:   12/24/17 1315  PainSc: 0-No pain         Complications: No apparent anesthesia complications

## 2017-12-24 NOTE — Anesthesia Preprocedure Evaluation (Signed)
Anesthesia Evaluation  Patient identified by MRN, date of birth, ID band Patient awake    Reviewed: Allergy & Precautions, H&P , NPO status , Patient's Chart, lab work & pertinent test results  Airway Mallampati: III  TM Distance: >3 FB Neck ROM: full    Dental  (+) Chipped   Pulmonary neg pulmonary ROS, neg shortness of breath, former smoker,           Cardiovascular Exercise Tolerance: Good (-) angina+ Peripheral Vascular Disease  (-) Past MI and (-) DOE      Neuro/Psych Seizures -,  PSYCHIATRIC DISORDERS Anxiety Depression negative psych ROS   GI/Hepatic negative GI ROS, Neg liver ROS,   Endo/Other  Hypothyroidism   Renal/GU negative Renal ROS  negative genitourinary   Musculoskeletal  (+) Arthritis ,   Abdominal   Peds  Hematology negative hematology ROS (+)   Anesthesia Other Findings Past Medical History: No date: Arthritis     Comment:  hips, lower back 10/21/2011: Breast lump     Comment:  Overview:  Negative diagnostic mammogram 09/2011  No date: Depression 08/17/2013: Fracture of rib of right side No date: Hypercholesterolemia No date: Hypothyroidism 10/25/2014: Low blood pressure No date: Panic disorder     Comment:  previous agoraphobia No date: Seizures (HCC)     Comment:  febrile - as infant No date: Shoulder pain, left     Comment:  bursa No date: Vertigo     Comment:  positional - rare No date: Vitamin D deficiency  Past Surgical History: 1981: ABDOMINAL HYSTERECTOMY     Comment:  partial - secondary to bleeding, ovaries not removed -  No date: CATARACT EXTRACTION W/ INTRAOCULAR LENS  IMPLANT, BILATERAL 05/05/2015: COLONOSCOPY WITH PROPOFOL; N/A     Comment:  Procedure: COLONOSCOPY WITH PROPOFOL;  Surgeon: Lucilla Lame, MD;  Location: [place];  Service:               Endoscopy;  Laterality: N/A; 2003: ORIF FIBULA FRACTURE; Right     Comment:  right lower  extremitiy No date: shoulder; Left     Comment:  bone suprs 12/2015: SHOULDER SURGERY 1974: TONSILLECTOMY     Reproductive/Obstetrics negative OB ROS                             Anesthesia Physical Anesthesia Plan  ASA: III  Anesthesia Plan: General   Post-op Pain Management:    Induction: Intravenous  PONV Risk Score and Plan: Propofol infusion and TIVA  Airway Management Planned: Natural Airway and Nasal Cannula  Additional Equipment:   Intra-op Plan:   Post-operative Plan:   Informed Consent: I have reviewed the patients History and Physical, chart, labs and discussed the procedure including the risks, benefits and alternatives for the proposed anesthesia with the patient or authorized representative who has indicated his/her understanding and acceptance.   Dental Advisory Given  Plan Discussed with: Anesthesiologist, CRNA and Surgeon  Anesthesia Plan Comments: (Patient consented for risks of anesthesia including but not limited to:  - adverse reactions to medications - risk of intubation if required - damage to teeth, lips or other oral mucosa - sore throat or hoarseness - Damage to heart, brain, lungs or loss of life  Patient voiced understanding.)        Anesthesia Quick Evaluation

## 2017-12-24 NOTE — Op Note (Signed)
    OPERATIVE NOTE   PROCEDURE: Excision right shin ganglion cyst  PRE-OPERATIVE DIAGNOSIS: Superficial thrombophlebitis right shin  POST-OPERATIVE DIAGNOSIS: Painful ganglion cyst right shin  SURGEON: Hortencia Pilar  ASSISTANT(S): None  ANESTHESIA: MAC  ESTIMATED BLOOD LOSS: Less than 5 cc  FINDING(S): Ganglion cyst approximately 2 cm x 2 cm  SPECIMEN(S): Ganglion cyst and contents to pathology  INDICATIONS:   Pam Salazar is a 72 y.o. female who presents with a painful mass in the right shin.  By examination I was suspicious this was a thrombophlebitis that continues to cause her pain.  She notes that it is being increasing in size and causing her increased symptoms.  We therefore elected to excise the mass.  DESCRIPTION: After full informed written consent was obtained from the patient, the patient was brought back to the operating room and placed supine upon the operating table.  Prior to induction, the patient received IV antibiotics.   After obtaining adequate anesthesia, the patient was then prepped and draped in the standard fashion for a excision of nodule right shin.  Quarter percent Marcaine with epinephrine was infiltrated into the soft tissues and skin and a linear incision was created along the lines of Langer hands.  Upon separating the skin flaps I encountered clear gelatinous fluid consistent with a ganglion cyst.  Contents were collected and included with the specimen.  The wall of the cyst was identified and using a 15 blade scalpel the cyst was excised in its entirety.  Fascia was exposed in the base of the wound but it was not violated.  Once the cyst had been completely excised it was passed off the field and included in the specimen cup.  The wound was irrigated.  Interrupted 3-0 Vicryl sutures were then used to reapproximate the subcutaneous tissues.  Skin was closed with 4-0 Monocryl subcuticular.  Dermabond was applied.     The patient tolerated this  procedure well.   COMPLICATIONS: None  CONDITION: Margaretmary Dys [facility]  Office: [phone]   12/24/2017, 1:43 PM

## 2017-12-24 NOTE — Anesthesia Procedure Notes (Signed)
Date/Time: 12/24/2017 12:20 PM Performed by: Johnna Acosta, CRNA Pre-anesthesia Checklist: Patient identified, Emergency Drugs available, Suction available, Patient being monitored and Timeout performed Patient Re-evaluated:Patient Re-evaluated prior to induction Oxygen Delivery Method: Simple face mask Preoxygenation: Pre-oxygenation with 100% oxygen

## 2017-12-24 NOTE — Anesthesia Post-op Follow-up Note (Signed)
Anesthesia QCDR form completed.        

## 2017-12-24 NOTE — Anesthesia Postprocedure Evaluation (Signed)
Anesthesia Post Note  Patient: Pam Salazar  Procedure(s) Performed: EXCISION MASS ( SHIN ) (Right )  Patient location during evaluation: PACU Anesthesia Type: General Level of consciousness: awake and alert Pain management: pain level controlled Vital Signs Assessment: post-procedure vital signs reviewed and stable Respiratory status: spontaneous breathing, nonlabored ventilation, respiratory function stable and patient connected to nasal cannula oxygen Cardiovascular status: blood pressure returned to baseline and stable Postop Assessment: no apparent nausea or vomiting Anesthetic complications: no     Last Vitals:  Vitals:   12/24/17 1344 12/24/17 1358  BP: (!) 89/56 (!) 94/55  Pulse: (!) 56 (!) 56  Resp: 14 16  Temp:  (!) 36.3 C  SpO2: 95% 98%    Last Pain:  Vitals:   12/24/17 1358  TempSrc: Temporal  PainSc: 0-No pain                 Precious Haws Kennede Lusk

## 2017-12-24 NOTE — Discharge Instructions (Signed)
AMBULATORY SURGERY  °DISCHARGE INSTRUCTIONS ° ° °1) The drugs that you were given will stay in your system until tomorrow so for the next 24 hours you should not: ° °A) Drive an automobile °B) Make any legal decisions °C) Drink any alcoholic beverage ° ° °2) You may resume regular meals tomorrow.  Today it is better to start with liquids and gradually work up to solid foods. ° °You may eat anything you prefer, but it is better to start with liquids, then soup and crackers, and gradually work up to solid foods. ° ° °3) Please notify your doctor immediately if you have any unusual bleeding, trouble breathing, redness and pain at the surgery site, drainage, fever, or pain not relieved by medication. ° ° ° °4) Additional Instructions: ° ° ° ° ° ° ° °Please contact your physician with any problems or Same Day Surgery at [phone], Monday through Friday 6 am to 4 pm, or [place] at [place] Main number at [phone]. °

## 2017-12-24 NOTE — H&P (Signed)
[facility] History & Physical Update  The patient was interviewed and re-examined.  The patient's previous History and Physical has been reviewed and is unchanged.  There is no change in the plan of care. We plan to proceed with the scheduled procedure.  Hortencia Pilar, MD  12/24/2017, 11:12 AM

## 2017-12-25 ENCOUNTER — Encounter: Payer: Self-pay | Admitting: Vascular Surgery

## 2017-12-25 LAB — BPAM RBC
Blood Product Expiration Date: 201907242359
Blood Product Expiration Date: 201907242359
ISSUE DATE / TIME: 201906300733
UNIT TYPE AND RH: 5100
Unit Type and Rh: 5100

## 2017-12-25 LAB — TYPE AND SCREEN
ABO/RH(D): O POS
Antibody Screen: POSITIVE
Unit division: 0
Unit division: 0

## 2017-12-26 LAB — SURGICAL PATHOLOGY

## 2018-01-01 ENCOUNTER — Telehealth: Payer: Self-pay

## 2018-01-01 NOTE — Telephone Encounter (Signed)
Copied from [place] [id number]. Topic: General - Other >> Jan 01, 2018 12:18 PM Keene Breath wrote: Reason for CRM: Patient called to get a [place] appointment.  Please advise.  CB# [phone].

## 2018-01-07 ENCOUNTER — Telehealth (INDEPENDENT_AMBULATORY_CARE_PROVIDER_SITE_OTHER): Payer: Self-pay | Admitting: Vascular Surgery

## 2018-01-07 NOTE — Telephone Encounter (Signed)
Called to see if willing to see KS tomorrow morning. GS will be in surgery tomorrow afternoon.

## 2018-01-08 ENCOUNTER — Ambulatory Visit (INDEPENDENT_AMBULATORY_CARE_PROVIDER_SITE_OTHER): Payer: Medicare Other | Admitting: Vascular Surgery

## 2018-01-12 ENCOUNTER — Ambulatory Visit (INDEPENDENT_AMBULATORY_CARE_PROVIDER_SITE_OTHER): Payer: Medicare Other | Admitting: Vascular Surgery

## 2018-01-13 ENCOUNTER — Ambulatory Visit (INDEPENDENT_AMBULATORY_CARE_PROVIDER_SITE_OTHER): Payer: Medicare Other

## 2018-01-13 VITALS — BP 108/72 | HR 71 | Temp 98.1°F | Resp 14 | Ht 62.5 in | Wt 131.8 lb

## 2018-01-13 DIAGNOSIS — Z0000 Encounter for general adult medical examination without abnormal findings: Secondary | ICD-10-CM | POA: Diagnosis not present

## 2018-01-13 NOTE — Progress Notes (Signed)
Subjective:   Pam Salazar is a 72 y.o. female who presents for Medicare Annual (Subsequent) preventive examination.  Review of Systems:  No ROS.  Medicare Wellness Visit. Additional risk factors are reflected in the social history. Cardiac Risk Factors include: advanced age (>83men, >30 women)     Objective:     Vitals: BP 108/72 (BP Location: Right Arm, Patient Position: Sitting, Cuff Size: Normal)   Pulse 71   Temp 98.1 F (36.7 C) (Oral)   Resp 14   Ht 5' 2.5" (1.588 m)   Wt 131 lb 12.8 oz (59.8 kg)   SpO2 98%   BMI 23.72 kg/m   Body mass index is 23.72 kg/m.  Advanced Directives 01/13/2018 12/22/2017 04/10/2017 07/11/2015  Does Patient Have a Medical Advance Directive? Yes Yes No Yes  Type of Programmer, systems - Healthcare Power of [place];Living will  Does patient want to make changes to medical advance directive? No - Patient declined - - No - Patient declined  Copy of [place] in Chart? No - copy requested - - No - copy requested    Tobacco Social History   Tobacco Use  Smoking Status Former Smoker  . Types: Cigarettes  Smokeless Tobacco Former Systems developer  . Quit date: 07/26/2013  Tobacco Comment         Counseling given: Not Answered Comment:     Clinical Intake:                       Past Medical History:  Diagnosis Date  . Arthritis    hips, lower back  . Breast lump 10/21/2011   Overview:  Negative diagnostic mammogram 09/2011   . Depression   . Fracture of rib of right side 08/17/2013  . Hypercholesterolemia   . Hypothyroidism   . Low blood pressure 10/25/2014  . Panic disorder    previous agoraphobia  . Seizures (HCC)    febrile - as infant  . Shoulder pain, left    bursa  . Vertigo    positional - rare  . Vitamin D deficiency    Past Surgical History:  Procedure Laterality Date  . ABDOMINAL HYSTERECTOMY  1981   partial - secondary to bleeding, ovaries  not removed -   . CATARACT EXTRACTION W/ INTRAOCULAR LENS  IMPLANT, BILATERAL    . COLONOSCOPY WITH PROPOFOL N/A 05/05/2015   Procedure: COLONOSCOPY WITH PROPOFOL;  Surgeon: Lucilla Lame, MD;  Location: [facility];  Service: Endoscopy;  Laterality: N/A;  . MASS EXCISION Right 12/24/2017   Procedure: EXCISION MASS ( SHIN );  Surgeon: Katha Cabal, MD;  Location: [facility];  Service: Vascular;  Laterality: Right;  . ORIF FIBULA FRACTURE Right 2003   right lower extremitiy  . shoulder Left    bone suprs  . SHOULDER SURGERY  12/2015  . TONSILLECTOMY  1974   Family History  Problem Relation Age of Onset  . Arthritis Mother   . Hyperlipidemia Mother   . Arthritis Father   . Prostate cancer Father   . Breast cancer Sister 56  . Heart disease Brother        heart attack  . Diabetes Brother   . Breast cancer Maternal Aunt 85  . Colon cancer Maternal Uncle    Social History   Socioeconomic History  . Marital status: Married    Spouse name: Not on file  . Number of children: Not on  file  . Years of education: Not on file  . Highest education level: Not on file  Occupational History  . Not on file  Social Needs  . Financial resource strain: Not hard at all  . Food insecurity:    Worry: Never true    Inability: Never true  . Transportation needs:    Medical: No    Non-medical: No  Tobacco Use  . Smoking status: Former Smoker    Types: Cigarettes  . Smokeless tobacco: Former Systems developer    Quit date: 07/26/2013  . Tobacco comment:    Substance and Sexual Activity  . Alcohol use: No    Alcohol/week: 0.0 oz  . Drug use: No  . Sexual activity: Yes    Birth control/protection: Post-menopausal  Lifestyle  . Physical activity:    Days per week: 4 days    Minutes per session: 90 min  . Stress: Very much  Relationships  . Social connections:    Talks on phone: More than three times a week    Gets together: Once a week    Attends religious service: More than 4 times per  year    Active member of club or organization: Yes    Attends meetings of clubs or organizations: More than 4 times per year    Relationship status: Married  Other Topics Concern  . Not on file  Social History Narrative  . Not on file    Outpatient Encounter Medications as of 01/13/2018  Medication Sig  . clonazePAM (KLONOPIN) 1 MG tablet Take 1 tablet (1 mg total) by mouth 2 (two) times daily as needed. for anxiety  . diphenhydramine-acetaminophen (TYLENOL PM) 25-500 MG TABS tablet Take 1 tablet by mouth at bedtime.   Marland Kitchen levothyroxine (SYNTHROID, LEVOTHROID) 100 MCG tablet Take 100 mcg by mouth daily before breakfast.   . Melatonin 3 MG CAPS Take 1 capsule by mouth at bedtime as needed.  . Multiple Vitamins-Minerals (PRESERVISION AREDS 2 PO) Take 1 capsule by mouth 2 (two) times daily.   . sertraline (ZOLOFT) 100 MG tablet Take 1 tablet (100 mg total) by mouth daily.  . simvastatin (ZOCOR) 20 MG tablet TAKE 1 TABLET BY MOUTH EVERY EVENING  . [DISCONTINUED] traMADol (ULTRAM) 50 MG tablet Take 1 tablet (50 mg total) by mouth every 6 (six) hours as needed for moderate pain or severe pain.   No facility-administered encounter medications on file as of 01/13/2018.     Activities of Daily Living In your present state of health, do you have any difficulty performing the following activities: 01/13/2018 12/22/2017  Hearing? Y Y  Comment Some difficulty hearing some coversational tones and television programs; follow up declined at this time.  -  Vision? N N  Difficulty concentrating or making decisions? N N  Walking or climbing stairs? N N  Dressing or bathing? N -  Doing errands, shopping? N N  Preparing Food and eating ? N -  Using the Toilet? N -  In the past six months, have you accidently leaked urine? N -  Do you have problems with loss of bowel control? N -  Managing your Medications? N -  Managing your Finances? N -  Housekeeping or managing your Housekeeping? N -  Some recent data  might be hidden    Patient Care Team: Leone Haven, MD as PCP - General (Family Medicine) Christene Lye, MD as Consulting Physician (General Surgery) Einar Pheasant, MD (Internal Medicine)    Assessment:   This  is a routine wellness examination for Pam Salazar.  The goal of the wellness visit is to assist the patient how to close the gaps in care and create a preventative care plan for the patient.   The roster of all physicians providing medical care to patient is listed in the Snapshot section of the chart.  Taking calcium VIT D as appropriate/Osteoporosis risk reviewed.    Safety issues reviewed; Smoke and carbon monoxide detectors in the home. No firearms in the home. Wears seatbelts when driving or riding with others. No violence in the home.  They do not have excessive sun exposure.  Discussed the need for sun protection: hats, long sleeves and the use of sunscreen if there is significant sun exposure.  Patient is alert, normal appearance, oriented to person/place/and time. Correctly identified the president of the [place] and recalls of 3/3 words.Performs simple calculations and can read correct time from watch face. Displays appropriate judgement.  No new identified risk were noted.  No failures at ADL's or IADL's.    BMI- discussed the importance of a healthy diet, water intake and the benefits of aerobic exercise. She tries to have a healthy diet, plans to reduce her sugar intake and has adequate water intake. She plans to increase her physical activity at the gym with structured aerobic classes.   RLE ganglion cyst x4 removed 12/24/17 intact. She states, "it feels better and has improved." Follow up scheduled with Dr. Delana Meyer on 01/29/18.   Zoloft- taking 100mg  as prescribed and reports doing very well.   24 hour diet recall: Lean protein diet  Dental- every 6 months.  Sleep patterns- Sleeps 8 hours at night.  Reports taking tylenol, melatonin and her husbands  gabapentin each night at bedtime. Wakes feeling rested. States she tried to discontinue the gabapentin with taper but was unable to sleep as well. She declined immediate follow up for medication and sleep management. Next follow up in November with plans to come fasting.    TDAP vaccine deferred per patient preference.  Follow up with insurance.  Educational material provided.  Exercise Activities and Dietary recommendations Current Exercise Habits: Home exercise routine, Type of exercise: walking;strength training/weights, Time (Minutes): > 60(90 minutes), Frequency (Times/Week): 3, Weekly Exercise (Minutes/Week): 0, Intensity: Moderate  Goals    . DIET - REDUCE SUGAR INTAKE    . Increase physical activity       Fall Risk Fall Risk  01/13/2018 10/22/2017 04/30/2016 07/11/2015 03/16/2015  Falls in the past year? No No No Yes Yes  Number falls in past yr: - - - 1 1  Injury with Fall? - - - Yes Yes  Risk for fall due to : - - - - -  Follow up - - - Education provided;Falls prevention discussed -   Depression Screen PHQ 2/9 Scores 01/13/2018 04/30/2016 07/11/2015 03/16/2015  PHQ - 2 Score 0 0 0 0     Cognitive Function MMSE - Mini Mental State Exam 01/13/2018 07/11/2015  Orientation to time 5 5  Orientation to Place 5 5  Registration 3 3  Attention/ Calculation 5 5  Recall 3 3  Language- name 2 objects 2 2  Language- repeat 1 1  Language- follow 3 step command 3 3  Language- read & follow direction 1 1  Write a sentence 1 1  Copy design 1 1  Total score 30 30        Immunization History  Administered Date(s) Administered  . Influenza Split 06/28/2013, 05/27/2014, 04/21/2017  .  Influenza-Unspecified 05/25/2015, 05/24/2017  . Pneumococcal Conjugate-13 08/03/2013  . Pneumococcal Polysaccharide-23 09/14/2012, 07/11/2015  . Zoster 06/24/2008    Screening Tests Health Maintenance  Topic Date Due  . TETANUS/TDAP  06[birth year]/1965  . INFLUENZA VACCINE  01/22/2018  . MAMMOGRAM  12/13/2018   . COLONOSCOPY  05/04/2025  . DEXA SCAN  Completed  . Hepatitis C Screening  Completed  . PNA vac Low Risk Adult  Completed      Plan:    End of life planning; Advance aging; Advanced directives discussed. Copy of current HCPOA/Living Will requested.    I have personally reviewed and noted the following in the patient's chart:   . Medical and social history . Use of alcohol, tobacco or illicit drugs  . Current medications and supplements . Functional ability and status . Nutritional status . Physical activity . Advanced directives . List of other physicians . Hospitalizations, surgeries, and ER visits in previous 12 months . Vitals . Screenings to include cognitive, depression, and falls . Referrals and appointments  In addition, I have reviewed and discussed with patient certain preventive protocols, quality metrics, and best practice recommendations. A written personalized care plan for preventive services as well as general preventive health recommendations were provided to patient.     Varney Biles, LPN  1/55/2080

## 2018-01-13 NOTE — Patient Instructions (Addendum)
Pam Salazar , Thank you for taking time to come for your Medicare Wellness Visit. I appreciate your ongoing commitment to your health goals. Please review the following plan we discussed and let me know if I can assist you in the future.   Follow up as needed.    Bring a copy of your [place] and/or Living Will to be scanned into chart.  Have a great day!  These are the goals we discussed: Goals    . DIET - REDUCE SUGAR INTAKE    . Increase physical activity       This is a list of the screening recommended for you and due dates:  Health Maintenance  Topic Date Due  . Tetanus Vaccine  06/03/1965  . Flu Shot  01/22/2018  . Mammogram  12/13/2018  . Colon Cancer Screening  05/04/2025  . DEXA scan (bone density measurement)  Completed  .  Hepatitis C: One time screening is recommended by Center for Disease Control  (CDC) for  adults born from [birth year] through 1965.   Completed  . Pneumonia vaccines  Completed    Hearing Loss Hearing loss is a partial or total loss of the ability to hear. This can be temporary or permanent, and it can happen in one or both ears. Hearing loss may be referred to as deafness. Medical care is necessary to treat hearing loss properly and to prevent the condition from getting worse. Your hearing may partially or completely come back, depending on what caused your hearing loss and how severe it is. In some cases, hearing loss is permanent. What are the causes? Common causes of hearing loss include:  Too much wax in the ear canal.  Infection of the ear canal or middle ear.  Fluid in the middle ear.  Injury to the ear or surrounding area.  An object stuck in the ear.  Prolonged exposure to loud sounds, such as music.  Less common causes of hearing loss include:  Tumors in the ear.  Viral or bacterial infections, such as meningitis.  A hole in the eardrum (perforated eardrum).  Problems with the hearing nerve that sends signals  between the brain and the ear.  Certain medicines.  What are the signs or symptoms? Symptoms of this condition may include:  Difficulty telling the difference between sounds.  Difficulty following a conversation when there is background noise.  Lack of response to sounds in your environment. This may be most noticeable when you do not respond to startling sounds.  Needing to turn up the volume on the television, radio, etc.  Ringing in the ears.  Dizziness.  Pain in the ears.  How is this diagnosed? This condition is diagnosed based on a physical exam and a hearing test (audiometry). The audiometry test will be performed by a hearing specialist (audiologist). You may also be referred to an ear, nose, and throat (ENT) specialist (otolaryngologist). How is this treated? Treatment for recent onset of hearing loss may include:  Ear wax removal.  Being prescribed medicines to prevent infection (antibiotics).  Being prescribed medicines to reduce inflammation (corticosteroids).  Follow these instructions at home:  If you were prescribed an antibiotic medicine, take it as told by your health care provider. Do not stop taking the antibiotic even if you start to feel better.  Take over-the-counter and prescription medicines only as told by your health care provider.  Avoid loud noises.  Return to your normal activities as told by your health  care provider. Ask your health care provider what activities are safe for you.  Keep all follow-up visits as told by your health care provider. This is important. Contact a health care provider if:  You feel dizzy.  You develop new symptoms.  You vomit or feel nauseous.  You have a fever. Get help right away if:  You develop sudden changes in your vision.  You have severe ear pain.  You have new or increased weakness.  You have a severe headache. This information is not intended to replace advice given to you by your health  care provider. Make sure you discuss any questions you have with your health care provider. Document Released: 06/10/2005 Document Revised: 11/16/2015 Document Reviewed: 10/26/2014 Elsevier Interactive Patient Education  2018 [employer].

## 2018-01-29 ENCOUNTER — Ambulatory Visit (INDEPENDENT_AMBULATORY_CARE_PROVIDER_SITE_OTHER): Payer: Medicare Other | Admitting: Vascular Surgery

## 2018-01-29 ENCOUNTER — Encounter (INDEPENDENT_AMBULATORY_CARE_PROVIDER_SITE_OTHER): Payer: Self-pay | Admitting: Vascular Surgery

## 2018-01-29 VITALS — BP 90/53 | HR 78 | Resp 16 | Ht 62.5 in | Wt 132.0 lb

## 2018-01-29 DIAGNOSIS — M6740 Ganglion, unspecified site: Secondary | ICD-10-CM | POA: Diagnosis not present

## 2018-01-29 DIAGNOSIS — I83811 Varicose veins of right lower extremities with pain: Secondary | ICD-10-CM | POA: Diagnosis not present

## 2018-02-04 ENCOUNTER — Encounter (INDEPENDENT_AMBULATORY_CARE_PROVIDER_SITE_OTHER): Payer: Self-pay | Admitting: Vascular Surgery

## 2018-02-04 DIAGNOSIS — M6740 Ganglion, unspecified site: Secondary | ICD-10-CM | POA: Insufficient documentation

## 2018-02-04 NOTE — Progress Notes (Signed)
MRN : [id number]  STEPHENIE Salazar is a 72 y.o. (07-01-1945) female who presents with chief complaint of  Chief Complaint  Patient presents with  . Follow-up    2 week Mass removal f/u  .  History of Present Illness:   The patient is s/p excision of a right shin mass.  She denies pain at the site she notes it has healed nicely.  The swelling has improved and the pain associated with swelling has decreased substantially. There have not been any interval development of a ulcerations or wounds.  Since the previous visit the patient has been wearing graduated compression stockings and has noted little significant improvement in the lymphedema. The patient has been using compression routinely morning until night.  The patient also states elevation during the day and exercise is being done too.     Current Meds  Medication Sig  . clonazePAM (KLONOPIN) 1 MG tablet Take 1 tablet (1 mg total) by mouth 2 (two) times daily as needed. for anxiety  . diphenhydramine-acetaminophen (TYLENOL PM) 25-500 MG TABS tablet Take 1 tablet by mouth at bedtime.   Marland Kitchen levothyroxine (SYNTHROID, LEVOTHROID) 100 MCG tablet Take 100 mcg by mouth daily before breakfast.   . Melatonin 3 MG CAPS Take 1 capsule by mouth at bedtime as needed.  . Multiple Vitamins-Minerals (PRESERVISION AREDS 2 PO) Take 1 capsule by mouth 2 (two) times daily.   . sertraline (ZOLOFT) 100 MG tablet Take 1 tablet (100 mg total) by mouth daily.  . simvastatin (ZOCOR) 20 MG tablet TAKE 1 TABLET BY MOUTH EVERY EVENING    Past Medical History:  Diagnosis Date  . Arthritis    hips, lower back  . Breast lump 10/21/2011   Overview:  Negative diagnostic mammogram 09/2011   . Depression   . Fracture of rib of right side 08/17/2013  . Hypercholesterolemia   . Hypothyroidism   . Low blood pressure 10/25/2014  . Panic disorder    previous agoraphobia  . Seizures (HCC)    febrile - as infant  . Shoulder pain, left    bursa  . Vertigo    positional - rare  . Vitamin D deficiency     Past Surgical History:  Procedure Laterality Date  . ABDOMINAL HYSTERECTOMY  1981   partial - secondary to bleeding, ovaries not removed -   . CATARACT EXTRACTION W/ INTRAOCULAR LENS  IMPLANT, BILATERAL    . COLONOSCOPY WITH PROPOFOL N/A 05/05/2015   Procedure: COLONOSCOPY WITH PROPOFOL;  Surgeon: Lucilla Lame, MD;  Location: [facility];  Service: Endoscopy;  Laterality: N/A;  . MASS EXCISION Right 12/24/2017   Procedure: EXCISION MASS ( SHIN );  Surgeon: Katha Cabal, MD;  Location: [facility];  Service: Vascular;  Laterality: Right;  . ORIF FIBULA FRACTURE Right 2003   right lower extremitiy  . shoulder Left    bone suprs  . SHOULDER SURGERY  12/2015  . TONSILLECTOMY  1974    Social History Social History   Tobacco Use  . Smoking status: Former Smoker    Types: Cigarettes  . Smokeless tobacco: Former Systems developer    Quit date: 07/26/2013  . Tobacco comment:    Substance Use Topics  . Alcohol use: No    Alcohol/week: 0.0 standard drinks  . Drug use: No    Family History Family History  Problem Relation Age of Onset  . Arthritis Mother   . Hyperlipidemia Mother   . Arthritis Father   . Prostate cancer  Father   . Breast cancer Sister 70  . Heart disease Brother        heart attack  . Diabetes Brother   . Breast cancer Maternal Aunt 85  . Colon cancer Maternal Uncle     Allergies  Allergen Reactions  . Duloxetine Hcl Other (See Comments)    depression worsened  . Ezetimibe-Simvastatin Other (See Comments)    Joint pain     REVIEW OF SYSTEMS (Negative unless checked)  Constitutional: [] Weight loss  [] Fever  [] Chills Cardiac: [] Chest pain   [] Chest pressure   [] Palpitations   [] Shortness of breath when laying flat   [] Shortness of breath with exertion. Vascular:  [] Pain in legs with walking   [] Pain in legs at rest  [] History of DVT   [] Phlebitis   [] Swelling in legs   [] Varicose veins   [] Non-healing  ulcers Pulmonary:   [] Uses home oxygen   [] Productive cough   [] Hemoptysis   [] Wheeze  [] COPD   [] Asthma Neurologic:  [] Dizziness   [] Seizures   [] History of stroke   [] History of TIA  [] Aphasia   [] Vissual changes   [] Weakness or numbness in arm   [] Weakness or numbness in leg Musculoskeletal:   [] Joint swelling   [] Joint pain   [] Low back pain Hematologic:  [] Easy bruising  [] Easy bleeding   [] Hypercoagulable state   [] Anemic Gastrointestinal:  [] Diarrhea   [] Vomiting  [] Gastroesophageal reflux/heartburn   [] Difficulty swallowing. Genitourinary:  [] Chronic kidney disease   [] Difficult urination  [] Frequent urination   [] Blood in urine Skin:  [] Rashes   [] Ulcers  Psychological:  [] History of anxiety   []  History of major depression.  Physical Examination  Vitals:   01/29/18 1409  BP: (!) 90/53  Pulse: 78  Resp: 16  Weight: 132 lb (59.9 kg)  Height: 5' 2.5" (1.588 m)   Body mass index is 23.76 kg/m. Gen: WD/WN, NAD Head: [place]/AT, No temporalis wasting.  Ear/Nose/Throat: Hearing grossly intact, nares w/o erythema or drainage Eyes: PER, EOMI, sclera nonicteric.  Neck: Supple, no large masses.   Pulmonary:  Good air movement, no audible wheezing bilaterally, no use of accessory muscles.  Cardiac: RRR, no JVD Vascular: incision CD&I Vessel Right Left  Radial Palpable Palpable  Gastrointestinal: Non-distended. No guarding/no peritoneal signs.  Musculoskeletal: M/S 5/5 throughout.  No deformity or atrophy.  Neurologic: CN 2-12 intact. Symmetrical.  Speech is fluent. Motor exam as listed above. Psychiatric: Judgment intact, Mood & affect appropriate for pt's clinical situation. Dermatologic: No rashes or ulcers noted.  No changes consistent with cellulitis. Lymph : No lichenification or skin changes of chronic lymphedema.  CBC Lab Results  Component Value Date   WBC 5.2 12/22/2017   HGB 13.6 12/22/2017   HCT 39.6 12/22/2017   MCV 88.5 12/22/2017   PLT 185 12/22/2017    BMET     Component Value Date/Time   NA 140 07/30/2017 0950   K 4.1 07/30/2017 0950   CL 105 07/30/2017 0950   CO2 26 07/30/2017 0950   GLUCOSE 89 07/30/2017 0950   BUN 23 07/30/2017 0950   CREATININE 0.73 07/30/2017 0950   CREATININE 0.87 09/23/2016 1535   CALCIUM 9.5 07/30/2017 0950   GFRNONAA >60 04/10/2017 1203   GFRAA >60 04/10/2017 1203   CrCl cannot be calculated (Patient's most recent lab result is older than the maximum 21 days allowed.).  COAG Lab Results  Component Value Date   INR 0.96 12/22/2017    Radiology No results found.   Assessment/Plan 1.  Mucoid cyst of joint Pathology is benign no further treatment  2. Varicose veins of leg with pain, right Recommend:  The patient is complaining of varicose veins.    I have had a long discussion with the patient regarding  varicose veins and why they cause symptoms.  Patient will begin wearing graduated compression stockings on a daily basis, beginning first thing in the morning and removing them in the evening. The patient is instructed specifically not to sleep in the stockings.    The patient  will also begin using over-the-counter analgesics such as Motrin 600 mg po TID to help control the symptoms as needed.    In addition, behavioral modification including elevation during the day will be initiated, utilizing a recliner was recommended.  The patient is also instructed to continue exercising such as walking 4-5 times per week.  At this time the patient wishes to continue conservative therapy and is not interested in more invasive treatments such as laser ablation and sclerotherapy.  The Patient will follow up PRN if the symptoms worsen.    Hortencia Pilar, MD  02/04/2018 5:30 PM

## 2018-02-10 ENCOUNTER — Other Ambulatory Visit: Payer: Self-pay | Admitting: Family Medicine

## 2018-02-10 NOTE — Telephone Encounter (Signed)
Sent to pharmacy.  Controlled substance database reviewed. 

## 2018-02-10 NOTE — Telephone Encounter (Signed)
Last OV 10/22/17 last filled 11/12/17 60 2rf

## 2018-02-13 DIAGNOSIS — L570 Actinic keratosis: Secondary | ICD-10-CM | POA: Diagnosis not present

## 2018-02-14 ENCOUNTER — Encounter (INDEPENDENT_AMBULATORY_CARE_PROVIDER_SITE_OTHER): Payer: Self-pay | Admitting: Vascular Surgery

## 2018-03-05 ENCOUNTER — Other Ambulatory Visit: Payer: Self-pay | Admitting: Family Medicine

## 2018-03-21 DIAGNOSIS — L03213 Periorbital cellulitis: Secondary | ICD-10-CM | POA: Diagnosis not present

## 2018-03-21 DIAGNOSIS — H00022 Hordeolum internum right lower eyelid: Secondary | ICD-10-CM | POA: Diagnosis not present

## 2018-03-27 ENCOUNTER — Ambulatory Visit: Payer: Self-pay | Admitting: Internal Medicine

## 2018-04-13 ENCOUNTER — Other Ambulatory Visit: Payer: Self-pay | Admitting: Family Medicine

## 2018-04-14 ENCOUNTER — Encounter: Payer: Self-pay | Admitting: Family Medicine

## 2018-04-14 NOTE — Telephone Encounter (Signed)
Copied from [place] [id number]. Topic: General - Other >> Apr 14, 2018  9:31 AM Keene Breath wrote: Reason for CRM: Patient called to check the status of her refill request for clonazePAM (KLONOPIN) 1 MG tablet.  Patient stated that she is all out of the medication and really needs it to be filled today.  Please advise.  CB# [phone].

## 2018-04-14 NOTE — Telephone Encounter (Signed)
Last filled 03/13/18 Last office visit 10/22/17

## 2018-04-14 NOTE — Telephone Encounter (Signed)
error 

## 2018-04-20 ENCOUNTER — Other Ambulatory Visit: Payer: Self-pay | Admitting: Family Medicine

## 2018-04-20 ENCOUNTER — Telehealth: Payer: Self-pay

## 2018-04-20 NOTE — Telephone Encounter (Signed)
Noted.  Please offer her an appointment at 430 on Wednesday.  Thanks.

## 2018-04-20 NOTE — Telephone Encounter (Signed)
Please advise 

## 2018-04-20 NOTE — Telephone Encounter (Signed)
Copied from [place] [phone]. Topic: Appointment Scheduling - Scheduling Inquiry for Clinic >> Apr 20, 2018  9:57 AM Reyne Dumas L wrote: Reason for CRM:   Pt calling states she needs to be seen by Dr. Caryl Bis.  States that her depression and insomnia are really taking a hold and she wants to be seen to discuss with provider.  There is nothing prior to the appointment she already has in November, but pt states she feels she needs to be seen by him much sooner and would like to be considered for a work in. Pt states that she does feel safe where she is and is not feeling need for self-harm.  Pt states that she has just lost interest in normal activities, lack of sleep, lack of energy. Pt states that the only day this week she can not be seen is Thursday. Pt can be reached at [phone]

## 2018-04-21 ENCOUNTER — Encounter: Payer: Self-pay | Admitting: *Deleted

## 2018-04-21 NOTE — Telephone Encounter (Signed)
Pt returned call & accepted appt tomorrow 10/30 @ 4:30.

## 2018-04-21 NOTE — Telephone Encounter (Signed)
Sent pt a mychart message & left voicemail to offer appt on Wed 10/30 @ 4:30pm. Okay for PEC to schedule if pt calls back.

## 2018-04-22 ENCOUNTER — Encounter: Payer: Self-pay | Admitting: Family Medicine

## 2018-04-22 ENCOUNTER — Ambulatory Visit (INDEPENDENT_AMBULATORY_CARE_PROVIDER_SITE_OTHER): Payer: Medicare Other | Admitting: Family Medicine

## 2018-04-22 ENCOUNTER — Other Ambulatory Visit: Payer: Self-pay | Admitting: Family Medicine

## 2018-04-22 ENCOUNTER — Ambulatory Visit: Payer: Self-pay | Admitting: Family Medicine

## 2018-04-22 DIAGNOSIS — F419 Anxiety disorder, unspecified: Secondary | ICD-10-CM

## 2018-04-22 DIAGNOSIS — F329 Major depressive disorder, single episode, unspecified: Secondary | ICD-10-CM | POA: Diagnosis not present

## 2018-04-22 DIAGNOSIS — B0229 Other postherpetic nervous system involvement: Secondary | ICD-10-CM | POA: Diagnosis not present

## 2018-04-22 DIAGNOSIS — G4700 Insomnia, unspecified: Secondary | ICD-10-CM | POA: Diagnosis not present

## 2018-04-22 MED ORDER — GABAPENTIN 300 MG PO CAPS: 300.0000 mg | ORAL_CAPSULE | Freq: Every day | ORAL | 1 refills | Status: DC

## 2018-04-22 MED ORDER — SERTRALINE HCL 100 MG PO TABS: 150.0000 mg | ORAL_TABLET | Freq: Every day | ORAL | 1 refills | Status: DC

## 2018-04-22 MED ORDER — TRAZODONE HCL 50 MG PO TABS: 25.0000 mg | ORAL_TABLET | Freq: Every evening | ORAL | 0 refills | Status: DC | PRN

## 2018-04-22 NOTE — Progress Notes (Signed)
Tommi Rumps, MD Phone: [phone]  Pam Salazar is a 72 y.o. female who presents today for f/u.  CC: depression, insomnia, postherpetic neuralgia  Patient notes over the last month or so her depression has been worse.  It is off and on.  She has had difficulty sleeping as well typically only getting about 4 hours of sleep.  Notes that it is interfering with her doing things in her daily life.  She notes there have been no life changes.  Everyone in her family is good.  She notes no SI or HI.  She is on Zoloft and Klonopin.  No drowsiness with the Klonopin.  She has tried melatonin and Tylenol PM.  She notes no caffeine or alcohol intake.  No TV before bed.  She does read before bed.  She started going to the gym which has made her feel little bit better.  Patient reports history of shingles along her right flank previously.  She notes occasional shooting discomfort from postherpetic neuralgia in that area.  She has been taking 300 mg of gabapentin from her husband nightly for this.  Social History   Tobacco Use  Smoking Status Former Smoker  . Types: Cigarettes  Smokeless Tobacco Former Systems developer  . Quit date: 07/26/2013  Tobacco Comment         ROS see history of present illness  Objective  Physical Exam Vitals:   04/22/18 1617  BP: 90/60  Pulse: 63  Temp: 98.5 F (36.9 C)  SpO2: 98%    BP Readings from Last 3 Encounters:  04/22/18 90/60  01/29/18 (!) 90/53  01/13/18 108/72   Wt Readings from Last 3 Encounters:  04/22/18 135 lb 9.6 oz (61.5 kg)  01/29/18 132 lb (59.9 kg)  01/13/18 131 lb 12.8 oz (59.8 kg)    Physical Exam  Constitutional: No distress.  Cardiovascular: Normal rate, regular rhythm and normal heart sounds.  Pulmonary/Chest: Effort normal and breath sounds normal.  Neurological: She is alert.  Skin: Skin is warm and dry. She is not diaphoretic.  No tenderness over her right flank   Assessment/Plan: Please see individual problem list.  Anxiety  and depression Worsened recently.  No SI or HI.  We will increase Zoloft.  We will add trazodone to help with sleep.  She can continue Klonopin.  Return precautions in AVS.  Follow-up in 1 month.  Insomnia Patient with issues with insomnia that are likely affecting her depression.  We will trial trazodone.  Monitor for drowsiness.  Postherpetic neuralgia Patient with intermittent discomfort in the area of her prior shingles.  Gabapentin has been helpful though she has been taking her husband's.  I advised her against taking her husband's medications.  We will provide her with gabapentin to take at night as she has been.    No orders of the defined types were placed in this encounter.   Meds ordered this encounter  Medications  . sertraline (ZOLOFT) 100 MG tablet    Sig: Take 1.5 tablets (150 mg total) by mouth daily.    Dispense:  135 tablet    Refill:  1  . gabapentin (NEURONTIN) 300 MG capsule    Sig: Take 1 capsule (300 mg total) by mouth at bedtime.    Dispense:  90 capsule    Refill:  1  . traZODone (DESYREL) 50 MG tablet    Sig: Take 0.5-1 tablets (25-50 mg total) by mouth at bedtime as needed for sleep.    Dispense:  30 tablet  Refill:  0     Tommi Rumps, MD [place]

## 2018-04-22 NOTE — Assessment & Plan Note (Signed)
Patient with issues with insomnia that are likely affecting her depression.  We will trial trazodone.  Monitor for drowsiness.

## 2018-04-22 NOTE — Assessment & Plan Note (Signed)
Worsened recently.  No SI or HI.  We will increase Zoloft.  We will add trazodone to help with sleep.  She can continue Klonopin.  Return precautions in AVS.  Follow-up in 1 month.

## 2018-04-22 NOTE — Patient Instructions (Signed)
Nice to see you. We will increase your Zoloft. We will add trazodone to see if that will help with your sleep. I have prescribed gabapentin for your postherpetic neuralgia pain. If your depression worsens please let us know.  If you develop thoughts of harming your self or others please go to the emergency room.

## 2018-04-22 NOTE — Assessment & Plan Note (Signed)
Patient with intermittent discomfort in the area of her prior shingles.  Gabapentin has been helpful though she has been taking her husband's.  I advised her against taking her husband's medications.  We will provide her with gabapentin to take at night as she has been.

## 2018-04-29 NOTE — Telephone Encounter (Signed)
Copied from [place] [id number]. Topic: General - Other >> Apr 28, 2018  4:11 PM Marin Olp L wrote: Reason for CRM: Would like a call back from [place] to discuss recent medication changes. Patient will be out of town soon.

## 2018-04-30 ENCOUNTER — Encounter: Payer: Self-pay | Admitting: Family Medicine

## 2018-05-19 ENCOUNTER — Ambulatory Visit (INDEPENDENT_AMBULATORY_CARE_PROVIDER_SITE_OTHER): Payer: Medicare Other | Admitting: Family Medicine

## 2018-05-19 ENCOUNTER — Encounter: Payer: Self-pay | Admitting: Family Medicine

## 2018-05-19 VITALS — BP 108/68 | HR 85 | Temp 97.7°F | Wt 129.8 lb

## 2018-05-19 DIAGNOSIS — E039 Hypothyroidism, unspecified: Secondary | ICD-10-CM | POA: Diagnosis not present

## 2018-05-19 DIAGNOSIS — F419 Anxiety disorder, unspecified: Secondary | ICD-10-CM

## 2018-05-19 DIAGNOSIS — F329 Major depressive disorder, single episode, unspecified: Secondary | ICD-10-CM

## 2018-05-19 DIAGNOSIS — E559 Vitamin D deficiency, unspecified: Secondary | ICD-10-CM

## 2018-05-19 DIAGNOSIS — Z23 Encounter for immunization: Secondary | ICD-10-CM | POA: Diagnosis not present

## 2018-05-19 DIAGNOSIS — M545 Low back pain, unspecified: Secondary | ICD-10-CM

## 2018-05-19 DIAGNOSIS — F32A Depression, unspecified: Secondary | ICD-10-CM

## 2018-05-19 DIAGNOSIS — N644 Mastodynia: Secondary | ICD-10-CM

## 2018-05-19 DIAGNOSIS — E7800 Pure hypercholesterolemia, unspecified: Secondary | ICD-10-CM | POA: Diagnosis not present

## 2018-05-19 DIAGNOSIS — G4700 Insomnia, unspecified: Secondary | ICD-10-CM

## 2018-05-19 LAB — COMPREHENSIVE METABOLIC PANEL
ALBUMIN: 4.6 g/dL (ref 3.5–5.2)
ALK PHOS: 47 U/L (ref 39–117)
ALT: 11 U/L (ref 0–35)
AST: 21 U/L (ref 0–37)
BILIRUBIN TOTAL: 0.5 mg/dL (ref 0.2–1.2)
BUN: 27 mg/dL — AB (ref 6–23)
CO2: 25 mEq/L (ref 19–32)
Calcium: 9.6 mg/dL (ref 8.4–10.5)
Chloride: 104 mEq/L (ref 96–112)
Creatinine, Ser: 0.8 mg/dL (ref 0.40–1.20)
GFR: 74.95 mL/min (ref >60.00)
GLUCOSE: 92 mg/dL (ref 70–99)
POTASSIUM: 3.8 meq/L (ref 3.5–5.1)
Sodium: 139 mEq/L (ref 135–145)
TOTAL PROTEIN: 7.1 g/dL (ref 6.0–8.3)

## 2018-05-19 LAB — LIPID PANEL
CHOLESTEROL: 167 mg/dL (ref 0–200)
HDL: 58.5 mg/dL (ref >39.00)
LDL Cholesterol: 82 mg/dL (ref 0–99)
NONHDL: 108.87
Total CHOL/HDL Ratio: 3
Triglycerides: 135 mg/dL (ref 0.0–149.0)
VLDL: 27 mg/dL (ref 0.0–40.0)

## 2018-05-19 LAB — VITAMIN D 25 HYDROXY (VIT D DEFICIENCY, FRACTURES): VITD: 31.04 ng/mL (ref 30.00–100.00)

## 2018-05-19 LAB — TSH: TSH: 5.32 u[IU]/mL — AB (ref 0.35–4.50)

## 2018-05-19 MED ORDER — TETANUS-DIPHTH-ACELL PERTUSSIS 5-2.5-18.5 LF-MCG/0.5 IM SUSP: 0.5000 mL | Freq: Once | INTRAMUSCULAR | 0 refills | Status: AC

## 2018-05-19 NOTE — Assessment & Plan Note (Signed)
Improved.  She will continue Zoloft.  She will monitor her anxiety.

## 2018-05-19 NOTE — Assessment & Plan Note (Signed)
Resolved.  Negative mammogram.

## 2018-05-19 NOTE — Assessment & Plan Note (Signed)
Much improved.  Likely related to depression and anxiety.  She will continue Zoloft 150 mg daily.

## 2018-05-19 NOTE — Patient Instructions (Signed)
Nice to see you. Please continue with your Zoloft. Please get the tetanus vaccination from your pharmacy. You can get Shingrix from them as well. Please monitor your back pain and if it does not improve over the next 2 to 3 weeks please let us know. Please monitor your anxiety and if it worsens please let us know. We will get lab work today and contact you with the results.

## 2018-05-19 NOTE — Assessment & Plan Note (Signed)
Check vitamin D. 

## 2018-05-19 NOTE — Assessment & Plan Note (Signed)
Check TSH.  Continue Synthroid. 

## 2018-05-19 NOTE — Progress Notes (Signed)
Tommi Rumps, MD Phone: [phone]  Pam Salazar is a 72 y.o. female who presents today for f/u.  CC: depression/anxiety, hld, low back pain, history of breast tenderness  Depression/anxiety: Patient notes her depression is quite a bit better.  She notes no depressive symptoms.  She does get sad this time of year around the holidays given that most of her family is gone.  She notes increasing the Zoloft to 150 mg was very beneficial.  She notes trazodone is not helpful with sleep.  She does take the clonazepam.  She is now getting 7 to 8 hours of sleep and falls asleep fairly quickly.  She does note some anxiety over the last 24 hours though prior to then this was improved.  She notes no SI.  Hyperlipidemia: Taking simvastatin.  No chest pain, right upper quadrant pain, or myalgias.  History of breast tenderness: She notes this has not recurred.  She had a recent negative screening mammogram.  Low back pain: Notes it is a stiffness and tightness.  Does not radiate.  No injury.  No numbness or weakness.  No bowel or bladder incontinence.  No saddle anesthesia.  It gets a little better as the day goes on.  Has been going on for 2 weeks.  No history of cancer.  Social History   Tobacco Use  Smoking Status Former Smoker  . Types: Cigarettes  Smokeless Tobacco Former Systems developer  . Quit date: 07/26/2013  Tobacco Comment         ROS see history of present illness  Objective  Physical Exam Vitals:   05/19/18 1034  BP: 108/68  Pulse: 85  Temp: 97.7 F (36.5 C)  SpO2: 95%    BP Readings from Last 3 Encounters:  05/19/18 108/68  04/22/18 90/60  01/29/18 (!) 90/53   Wt Readings from Last 3 Encounters:  05/19/18 129 lb 12.8 oz (58.9 kg)  04/22/18 135 lb 9.6 oz (61.5 kg)  01/29/18 132 lb (59.9 kg)    Physical Exam  Constitutional: No distress.  Cardiovascular: Normal rate, regular rhythm and normal heart sounds.  Pulmonary/Chest: Effort normal and breath sounds normal.    Musculoskeletal: She exhibits no edema.  Midline spine tenderness, no midline spine step-off, no muscular back tenderness  Neurological: She is alert.  5/5 strength bilateral quads, hamstrings, plantar flexion, and dorsiflexion, sensation light touch intact bilateral lower extremities  Skin: Skin is warm and dry. She is not diaphoretic.     Assessment/Plan: Please see individual problem list.  Hypercholesterolemia Check lipid panel.  Continue simvastatin.  Insomnia Much improved.  Likely related to depression and anxiety.  She will continue Zoloft 150 mg daily.  Vitamin D deficiency Check vitamin D.  Anxiety and depression Improved.  She will continue Zoloft.  She will monitor her anxiety.  Hypothyroidism Check TSH.  Continue Synthroid.  Low back pain Suspect muscular strain.  Mild.  Discussed staying active.  Patient will monitor and if not improving in the next 2 to 3 weeks she will let us know.  Breast tenderness in female Resolved.  Negative mammogram.   Health Maintenance: Patient will get her Tdap and Shingrix at the pharmacy.  Orders Placed This Encounter  Procedures  . Flu vaccine HIGH DOSE PF (Fluzone High dose)  . TSH  . Lipid panel  . Comp Met (CMET)  . Vitamin D (25 hydroxy)    Meds ordered this encounter  Medications  . Tdap (BOOSTRIX) 5-2.5-18.5 LF-MCG/0.5 injection    Sig: Inject 0.5  mLs into the muscle once for 1 dose.    Dispense:  0.5 mL    Refill:  0     Tommi Rumps, MD [place]

## 2018-05-19 NOTE — Assessment & Plan Note (Signed)
Check lipid panel.  Continue simvastatin. 

## 2018-05-19 NOTE — Assessment & Plan Note (Addendum)
Suspect muscular strain.  Mild.  Discussed staying active.  Patient will monitor and if not improving in the next 2 to 3 weeks she will let us know.

## 2018-05-26 ENCOUNTER — Encounter: Payer: Self-pay | Admitting: *Deleted

## 2018-05-28 ENCOUNTER — Telehealth: Payer: Self-pay

## 2018-05-28 ENCOUNTER — Other Ambulatory Visit: Payer: Self-pay | Admitting: Family Medicine

## 2018-05-28 DIAGNOSIS — E039 Hypothyroidism, unspecified: Secondary | ICD-10-CM

## 2018-05-28 NOTE — Telephone Encounter (Signed)
Patient scheduled for Monday , patient may call back , tried to discuss with patient that if she is having 10 diarrhea BM per day she may need to be seen sooner, patient stated she could not talk she [place] [place] , she would call  Back.

## 2018-05-28 NOTE — Telephone Encounter (Signed)
Pam Salazar can you schedule this appt for the patient?  Same day appt   06/01/2018 @ 3:45 PM   Thanks

## 2018-05-28 NOTE — Telephone Encounter (Signed)
Pt is asking for an appt to be seen for diarrhea and left side back pain. Pt stated that she has to use the bathroom like ten times daily and it is all diarrhea.   Pt stated that she was Dx with microscopic colitis and is still dealing with the daily lower back pain near her kidneys and she is starting to be very concern.   Pt has been scheduled for an appt with PCP 06/01/2018

## 2018-05-29 ENCOUNTER — Encounter: Payer: Self-pay | Admitting: Family Medicine

## 2018-06-01 ENCOUNTER — Ambulatory Visit: Payer: Self-pay | Admitting: Family Medicine

## 2018-06-03 ENCOUNTER — Other Ambulatory Visit: Payer: Self-pay | Admitting: Family Medicine

## 2018-06-03 DIAGNOSIS — E039 Hypothyroidism, unspecified: Secondary | ICD-10-CM

## 2018-06-10 ENCOUNTER — Other Ambulatory Visit: Payer: Self-pay | Admitting: Family Medicine

## 2018-06-11 NOTE — Telephone Encounter (Signed)
Controlled substance database reviewed. Sent to pharmacy.   

## 2018-06-18 ENCOUNTER — Encounter: Payer: Self-pay | Admitting: Family Medicine

## 2018-06-18 ENCOUNTER — Ambulatory Visit (INDEPENDENT_AMBULATORY_CARE_PROVIDER_SITE_OTHER): Payer: Medicare Other | Admitting: Family Medicine

## 2018-06-18 VITALS — BP 90/62 | HR 76 | Temp 98.0°F | Ht 65.2 in | Wt 129.6 lb

## 2018-06-18 DIAGNOSIS — R197 Diarrhea, unspecified: Secondary | ICD-10-CM | POA: Diagnosis not present

## 2018-06-18 LAB — CBC WITH DIFFERENTIAL/PLATELET
Basophils Absolute: 0 10*3/uL (ref 0.0–0.1)
Basophils Relative: 0.4 % (ref 0.0–3.0)
Eosinophils Absolute: 0 10*3/uL (ref 0.0–0.7)
Eosinophils Relative: 0 % (ref 0.0–5.0)
HCT: 39.6 % (ref 36.0–46.0)
Hemoglobin: 13.5 g/dL (ref 12.0–15.0)
Lymphocytes Relative: 19.3 % (ref 12.0–46.0)
Lymphs Abs: 1.1 10*3/uL (ref 0.7–4.0)
MCHC: 34 g/dL (ref 30.0–36.0)
MCV: 88.1 fl (ref 78.0–100.0)
MONOS PCT: 10.5 % (ref 3.0–12.0)
Monocytes Absolute: 0.6 10*3/uL (ref 0.1–1.0)
Neutro Abs: 4.1 10*3/uL (ref 1.4–7.7)
Neutrophils Relative %: 69.8 % (ref 43.0–77.0)
Platelets: 210 10*3/uL (ref 150.0–400.0)
RBC: 4.5 Mil/uL (ref 3.87–5.11)
RDW: 13.7 % (ref 11.5–15.5)
WBC: 5.9 10*3/uL (ref 4.0–10.5)

## 2018-06-18 LAB — BASIC METABOLIC PANEL
BUN: 29 mg/dL — ABNORMAL HIGH (ref 6–23)
CO2: 26 meq/L (ref 19–32)
Calcium: 9.2 mg/dL (ref 8.4–10.5)
Chloride: 106 mEq/L (ref 96–112)
Creatinine, Ser: 0.74 mg/dL (ref 0.40–1.20)
GFR: 81.99 mL/min (ref >60.00)
Glucose, Bld: 103 mg/dL — ABNORMAL HIGH (ref 70–99)
Potassium: 4 mEq/L (ref 3.5–5.1)
Sodium: 140 mEq/L (ref 135–145)

## 2018-06-18 NOTE — Progress Notes (Signed)
  Pam Rumps, MD Phone: [phone]  Pam Salazar is a 72 y.o. female who presents today for same-day visit.  CC: Diarrhea  Diarrhea: Patient reports this has been going on intermittently for a couple of months.  She will have an urge to have a bowel movement and then at times she will have projectile watery bowel movements.  Sometimes she will have soft bowel movements.  Up to 10 bowel movements daily.  She will have abdominal cramping in her lower abdomen with this.  No blood in her stool.  Some nausea.  No vomiting.  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  She did not take the Entocort that was prescribed by GI previously.  She has had no medication changes, fevers, travel, or contacts with this.  She has not been drinking out of any streams or [place].  She has no dysuria or vaginal symptoms.  Social History   Tobacco Use  Smoking Status Former Smoker  . Types: Cigarettes  Smokeless Tobacco Former Systems developer  . Quit date: 07/26/2013  Tobacco Comment         ROS see history of present illness  Objective  Physical Exam Vitals:   06/18/18 1345  BP: 90/62  Pulse: 76  Temp: 98 F (36.7 C)  SpO2: 97%    BP Readings from Last 3 Encounters:  06/18/18 90/62  05/19/18 108/68  04/22/18 90/60   Wt Readings from Last 3 Encounters:  06/18/18 129 lb 9.6 oz (58.8 kg)  05/19/18 129 lb 12.8 oz (58.9 kg)  04/22/18 135 lb 9.6 oz (61.5 kg)    Physical Exam Constitutional:      General: She is not in acute distress.    Appearance: She is not diaphoretic.  Cardiovascular:     Rate and Rhythm: Normal rate and regular rhythm.     Heart sounds: Normal heart sounds.  Pulmonary:     Effort: Pulmonary effort is normal.     Breath sounds: Normal breath sounds.  Abdominal:     General: Bowel sounds are normal. There is no distension.     Palpations: Abdomen is soft.     Tenderness: There  is abdominal tenderness (Mild lower abdominal). There is no guarding or rebound.  Skin:    General: Skin is warm and dry.  Neurological:     Mental Status: She is alert.      Assessment/Plan: Please see individual problem list.  Diarrhea This potentially could be related to the patient's lymphocytic colitis though could also represent an infectious cause versus dietary issue.  We will check stool studies to evaluate for infection.  We will check lab work as outlined below.  We will determine management based on her results.  She will stay adequately hydrated.  Return precautions in AVS.   Orders Placed This Encounter  Procedures  . Gastrointestinal Panel by PCR , Stool    Standing Status:   Future    Standing Expiration Date:   06/19/2019  . C Difficile Quick Screen w PCR reflex    Standing Status:   Future    Standing Expiration Date:   06/19/2019  . CBC w/Diff  . Basic Metabolic Panel (BMET)    No orders of the defined types were placed in this encounter.    Pam Rumps, MD [place]

## 2018-06-18 NOTE — Patient Instructions (Signed)
Nice to see you. We will check lab work and stool studies. If you develop blood in your stool or worsening abdominal discomfort please seek medical attention immediately.

## 2018-06-18 NOTE — Assessment & Plan Note (Addendum)
This potentially could be related to the patient's lymphocytic colitis though could also represent an infectious cause versus dietary issue.  We will check stool studies to evaluate for infection.  We will check lab work as outlined below.  We will determine management based on her results.  She will stay adequately hydrated.  Return precautions in AVS.

## 2018-06-19 ENCOUNTER — Other Ambulatory Visit
Admission: RE | Admit: 2018-06-19 | Discharge: 2018-06-19 | Disposition: A | Discharge status: Home / Self Care | Payer: Medicare Other | Source: Ambulatory Visit | Attending: Family Medicine | Admitting: Family Medicine

## 2018-06-19 DIAGNOSIS — R197 Diarrhea, unspecified: Secondary | ICD-10-CM

## 2018-06-19 LAB — GASTROINTESTINAL PANEL BY PCR, STOOL (REPLACES STOOL CULTURE)
Adenovirus F40/41: NOT DETECTED
Astrovirus: NOT DETECTED
Campylobacter species: NOT DETECTED
Cryptosporidium: NOT DETECTED
Cyclospora cayetanensis: NOT DETECTED
ENTAMOEBA HISTOLYTICA: NOT DETECTED
Enteroaggregative E coli (EAEC): NOT DETECTED
Enteropathogenic E coli (EPEC): NOT DETECTED
Enterotoxigenic E coli (ETEC): NOT DETECTED
Giardia lamblia: NOT DETECTED
Norovirus GI/GII: NOT DETECTED
Plesimonas shigelloides: NOT DETECTED
Rotavirus A: NOT DETECTED
SHIGA LIKE TOXIN PRODUCING E COLI (STEC): NOT DETECTED
Salmonella species: NOT DETECTED
Sapovirus (I, II, IV, and V): NOT DETECTED
Shigella/Enteroinvasive E coli (EIEC): NOT DETECTED
Vibrio cholerae: NOT DETECTED
Vibrio species: NOT DETECTED
YERSINIA ENTEROCOLITICA: NOT DETECTED

## 2018-06-19 LAB — C DIFFICILE QUICK SCREEN W PCR REFLEX
C Diff antigen: NEGATIVE
C Diff interpretation: NOT DETECTED
C Diff toxin: NEGATIVE

## 2018-06-29 ENCOUNTER — Encounter: Payer: Self-pay | Admitting: Family Medicine

## 2018-06-30 ENCOUNTER — Ambulatory Visit: Payer: Self-pay | Admitting: *Deleted

## 2018-06-30 DIAGNOSIS — K529 Noninfective gastroenteritis and colitis, unspecified: Secondary | ICD-10-CM

## 2018-06-30 NOTE — Addendum Note (Signed)
Addended by: Leone Haven on: 06/30/2018 04:15 PM   Modules accepted: Orders

## 2018-06-30 NOTE — Telephone Encounter (Signed)
Patient needs all her results and her plan of care- she needs to what to do about her symptoms- she is not getting better- she states the Imodium got to where is was not working so she quit it. Patient is requesting next steps. Please have [place] call her back.  Answer Assessment - Initial Assessment Questions 1. DIARRHEA SEVERITY: "How bad is the diarrhea?" "How many extra stools have you had in the past 24 hours than normal?"    - NO DIARRHEA (SCALE 0)   - MILD (SCALE 1-3): Few loose or mushy BMs; increase of 1-3 stools over normal daily number of stools; mild increase in ostomy output.   -  MODERATE (SCALE 4-7): Increase of 4-6 stools daily over normal; moderate increase in ostomy output. * SEVERE (SCALE 8-10; OR 'WORST POSSIBLE'): Increase of 7 or more stools daily over normal; moderate increase in ostomy output; incontinence.     Patient is at least going 3 times during the night- often during the day 2. ONSET: "When did the diarrhea begin?"      months 3. BM CONSISTENCY: "How loose or watery is the diarrhea?"      watery 4. VOMITING: "Are you also vomiting?" If so, ask: "How many times in the past 24 hours?"      no 5. ABDOMINAL PAIN: "Are you having any abdominal pain?" If yes: "What does it feel like?" (e.g., crampy, dull, intermittent, constant)      Some night she has cramping- urgency to go- sometimes she doesn't quite make it- rumbling after she eats 6. ABDOMINAL PAIN SEVERITY: If present, ask: "How bad is the pain?"  (e.g., Scale 1-10; mild, moderate, or severe)   - MILD (1-3): doesn't interfere with normal activities, abdomen soft and not tender to touch    - MODERATE (4-7): interferes with normal activities or awakens from sleep, tender to touch    - SEVERE (8-10): excruciating pain, doubled over, unable to do any normal activities       Patient is still having tenderness 7. ORAL INTAKE: If vomiting, "Have you been able to drink liquids?" "How much fluids have you had in the past  24 hours?"     Patient is drinking 52 oz daily 8. HYDRATION: "Any signs of dehydration?" (e.g., dry mouth [not just dry lips], too weak to stand, dizziness, new weight loss) "When did you last urinate?"     Weakness, weight loss 9. EXPOSURE: "Have you traveled to a foreign country recently?" "Have you been exposed to anyone with diarrhea?" "Could you have eaten any food that was spoiled?"     n/a 10. ANTIBIOTIC USE: "Are you taking antibiotics now or have you taken antibiotics in the past 2 months?"       no 11. OTHER SYMPTOMS: "Do you have any other symptoms?" (e.g., fever, blood in stool)       no 12. PREGNANCY: "Is there any chance you are pregnant?" "When was your last menstrual period?"       n/a  Protocols used: DIARRHEA-A-AH

## 2018-06-30 NOTE — Telephone Encounter (Signed)
Patient needs to see GI. Referral placed for GI.

## 2018-06-30 NOTE — Telephone Encounter (Signed)
Called and spoke with patient. Advised her that PCP is not in the office but will return on Wednesday. Pt stated that her symptoms seem worse and she can't due much due to her symptoms.   Pt is ready to be referred to see GI doctor does NOT  want to see Dr. Allen Norris will go [place] or [place] to be seen.   Sent to PCP to place referral and suggest any suggested.

## 2018-06-30 NOTE — Telephone Encounter (Signed)
Called and spoke with patient. Pt advised and voiced understanding.  

## 2018-06-30 NOTE — Telephone Encounter (Signed)
Sent to PCP   Called patient earlier and have not recived a call back in regards to the message you sent her on Mychart concerning her results.

## 2018-07-01 ENCOUNTER — Encounter: Payer: Self-pay | Admitting: Family Medicine

## 2018-07-03 ENCOUNTER — Other Ambulatory Visit: Payer: Self-pay

## 2018-07-06 ENCOUNTER — Telehealth: Payer: Self-pay | Admitting: Family Medicine

## 2018-07-06 NOTE — Telephone Encounter (Signed)
Copied from [place] [id number]. Topic: Quick Communication - See Telephone Encounter >> Jul 06, 2018  1:19 PM Blase Mess A wrote: CRM for notification. See Telephone encounter for: 07/06/18.  Patient is calling to speak to referral coordinator Please advise [phone]  Patient did did not want to go to [facility] or Dr. Allen Norris.  Patient is requesting Dr. Angela Nevin in [place], [place]. [phone]

## 2018-08-03 ENCOUNTER — Encounter: Payer: Self-pay | Admitting: Family Medicine

## 2018-08-03 ENCOUNTER — Other Ambulatory Visit (INDEPENDENT_AMBULATORY_CARE_PROVIDER_SITE_OTHER): Payer: Medicare Other

## 2018-08-03 DIAGNOSIS — E039 Hypothyroidism, unspecified: Secondary | ICD-10-CM

## 2018-08-03 LAB — TSH: TSH: 8.15 u[IU]/mL — ABNORMAL HIGH (ref 0.35–4.50)

## 2018-08-05 NOTE — Telephone Encounter (Signed)
Sent to PCP to advise treatment options for high TSH.

## 2018-08-06 MED ORDER — LEVOTHYROXINE SODIUM 112 MCG PO TABS: 112.0000 ug | ORAL_TABLET | Freq: Every day | ORAL | 1 refills | Status: DC

## 2018-08-10 ENCOUNTER — Other Ambulatory Visit: Payer: Self-pay | Admitting: Family Medicine

## 2018-08-10 NOTE — Telephone Encounter (Signed)
Last OV 06/18/2018   Last refilled 06/11/2018 disp 60 with 1 refill   Next OV 01/15/2019   Sent to PCP to advise

## 2018-08-11 DIAGNOSIS — H00015 Hordeolum externum left lower eyelid: Secondary | ICD-10-CM | POA: Diagnosis not present

## 2018-08-20 ENCOUNTER — Encounter: Payer: Self-pay | Admitting: Family Medicine

## 2018-08-20 ENCOUNTER — Ambulatory Visit (INDEPENDENT_AMBULATORY_CARE_PROVIDER_SITE_OTHER): Payer: Medicare Other | Admitting: Family Medicine

## 2018-08-20 VITALS — BP 90/60 | HR 76 | Temp 97.5°F | Resp 16 | Ht 62.5 in | Wt 126.6 lb

## 2018-08-20 DIAGNOSIS — T148XXA Other injury of unspecified body region, initial encounter: Secondary | ICD-10-CM | POA: Diagnosis not present

## 2018-08-20 DIAGNOSIS — M546 Pain in thoracic spine: Secondary | ICD-10-CM

## 2018-08-20 MED ORDER — KETOROLAC TROMETHAMINE 60 MG/2ML IM SOLN
60.0000 mg | Freq: Once | INTRAMUSCULAR | Status: AC
  Administered 2018-08-20: 60 mg via INTRAMUSCULAR

## 2018-08-20 MED ORDER — METHYLPREDNISOLONE ACETATE 40 MG/ML IJ SUSP
40.0000 mg | Freq: Once | INTRAMUSCULAR | Status: AC
  Administered 2018-08-20: 40 mg via INTRAMUSCULAR

## 2018-08-20 NOTE — Progress Notes (Signed)
Subjective:    Patient ID: Pam Salazar, female    DOB: Apr 07, 1946, 73 y.o.   MRN: [id number]  HPI   Patient presents to clinic complaining of right-sided mid back pain.  Pain began about 3 or 4 days ago after she was carrying a large pile of clothing up the stairs, states when carrying the close she had to twist to get herself up the stairs and through doorway.  Believes combination of carrying the large pile of clothing and twisting caused her to tweak her back.  She has not taken anything over-the-counter to help pain.  She has put ice pack on area without much help in reducing pain.  Denies neck pain low back pain.  Denies numbness or tingling in extremities.  Denies difficulty breathing, chest pain or palpitations.  Patient Active Problem List   Diagnosis Date Noted  . Postherpetic neuralgia 04/22/2018  . Mucoid cyst of joint 02/04/2018  . Superficial thrombophlebitis 12/18/2017  . Asymptomatic varicose vein of right lower extremity without complication 67/67/2094  . Menopausal vaginal dryness 05/09/2017  . Dyspareunia in female 05/09/2017  . Muscular chest pain 04/30/2017  . Urticaria 11/17/2015  . Angiodysplasia of intestine with hemorrhage   . Idiopathic colitis   . Diarrhea 02/14/2015  . Low blood pressure 10/25/2014  . Insomnia 09/26/2014  . Low back pain 04/12/2014  . Baker's cyst of knee 01/14/2014  . Cyst of soft tissue 01/14/2014  . Medial epicondylitis of right elbow 01/14/2014  . Varicose veins of leg with pain, right 12/14/2013  . History of colonic polyps 03/11/2013  . Anxiety and depression 03/07/2013  . Panic disorder 03/07/2013  . Hypercholesterolemia 03/07/2013  . Hypothyroidism 03/07/2013  . Vitamin D deficiency 03/07/2013  . Skin cancer 03/07/2013   Social History   Tobacco Use  . Smoking status: Former Smoker    Types: Cigarettes  . Smokeless tobacco: Former Systems developer    Quit date: 07/26/2013  . Tobacco comment:    Substance Use Topics  . Alcohol use:  No    Alcohol/week: 0.0 standard drinks   Review of Systems  Constitutional: Negative for chills, fatigue and fever.  HENT: Negative for congestion, ear pain, sinus pain and sore throat.   Eyes: Negative.   Respiratory: Negative for cough, shortness of breath and wheezing.   Cardiovascular: Negative for chest pain, palpitations and leg swelling.  Gastrointestinal: Negative for abdominal pain, diarrhea, nausea and vomiting.  Genitourinary: Negative for dysuria, frequency and urgency.  Musculoskeletal: +right mid back pain  Skin: Negative for color change, pallor and rash.  Neurological: Negative for syncope, light-headedness and headaches.  Psychiatric/Behavioral: The patient is not nervous/anxious.       Objective:   Physical Exam Vitals signs and nursing note reviewed.  Constitutional:      General: She is not in acute distress.    Appearance: She is not toxic-appearing.  HENT:     Head: Normocephalic and atraumatic.  Eyes:     General: No scleral icterus.    Extraocular Movements: Extraocular movements intact.     Conjunctiva/sclera: Conjunctivae normal.     Pupils: Pupils are equal, round, and reactive to light.  Neck:     Musculoskeletal: Normal range of motion and neck supple. No neck rigidity.  Cardiovascular:     Rate and Rhythm: Normal rate and regular rhythm.  Pulmonary:     Effort: Pulmonary effort is normal. No respiratory distress.     Breath sounds: No wheezing, rhonchi or rales.  Musculoskeletal:  Back:     Comments: Location of right-sided thoracic pain representing black-blue shape on diagram.  Pain with palpation of area.  Patient is able to twist side to side bend forward and backward and lean side to side without issue.  No pain with palpation of spinous process.  Upper extremity strength equal and strong.  Skin:    General: Skin is warm and dry.     Coloration: Skin is not jaundiced or pale.     Findings: No bruising, erythema, lesion or rash.    Neurological:     Mental Status: She is alert and oriented to person, place, and time.  Psychiatric:        Behavior: Behavior normal.    Vitals:   08/20/18 1039  BP: 90/60  Pulse: 76  Resp: 16  Temp: (!) 97.5 F (36.4 C)  SpO2: 95%       Assessment & Plan:   Acute right-sided thoracic pain, muscle strain - patient will get IM Toradol and IM methylprednisolone x1 for treatment of acute pain.  Advised to use a topical rub such as Biofreeze or BenGay on the sore area as well as a heating pad to help soothe muscles.  Discussed different stretching and range of motion exercises she can do to help reduce pain as well.  Patient aware she can use over-the-counter Tylenol or Advil if needed at home for pain, but I believe the topical Biofreeze and a heating pad will be most beneficial to help reduce pain.  Administrations This Visit    ketorolac (TORADOL) injection 60 mg    Admin Date 08/20/2018 Action Given Dose 60 mg Route Intramuscular Administered By Neta Ehlers, RMA       methylPREDNISolone acetate (DEPO-MEDROL) injection 40 mg    Admin Date 08/20/2018 Action Given Dose 40 mg Route Intramuscular Administered By Neta Ehlers, RMA         Patient will keep regularly scheduled follow-up with PCP as planned.  Advised to return to clinic sooner if new issues arise or if current symptoms do not improve as expected.

## 2018-08-20 NOTE — Patient Instructions (Signed)
Use biofreeze and heating pad on sore area  You can take tylenol or advil at home if needed for pain

## 2018-09-01 ENCOUNTER — Other Ambulatory Visit: Payer: Self-pay | Admitting: Family Medicine

## 2018-09-01 DIAGNOSIS — E039 Hypothyroidism, unspecified: Secondary | ICD-10-CM

## 2018-09-08 ENCOUNTER — Other Ambulatory Visit: Payer: Self-pay | Admitting: Family Medicine

## 2018-09-08 NOTE — Telephone Encounter (Signed)
Last OV 08/20/2018   Last refilled 08/11/2018 disp 60 with no refills   Next appt 01/15/2019  Sent to PCP for approval

## 2018-09-14 ENCOUNTER — Encounter: Payer: Self-pay | Admitting: Family Medicine

## 2018-09-16 ENCOUNTER — Other Ambulatory Visit: Payer: Self-pay

## 2018-09-17 ENCOUNTER — Encounter: Payer: Self-pay | Admitting: Family Medicine

## 2018-09-17 NOTE — Telephone Encounter (Signed)
Sent to PCP ?

## 2018-09-18 ENCOUNTER — Other Ambulatory Visit: Payer: Self-pay | Admitting: Family Medicine

## 2018-09-18 ENCOUNTER — Ambulatory Visit (INDEPENDENT_AMBULATORY_CARE_PROVIDER_SITE_OTHER): Payer: Medicare Other | Admitting: Family Medicine

## 2018-09-18 ENCOUNTER — Encounter: Payer: Self-pay | Admitting: Family Medicine

## 2018-09-18 ENCOUNTER — Other Ambulatory Visit: Payer: Self-pay

## 2018-09-18 VITALS — BP 92/70 | HR 72 | Temp 96.0°F | Ht 62.5 in | Wt 126.4 lb

## 2018-09-18 DIAGNOSIS — N649 Disorder of breast, unspecified: Secondary | ICD-10-CM

## 2018-09-18 DIAGNOSIS — R634 Abnormal weight loss: Secondary | ICD-10-CM | POA: Diagnosis not present

## 2018-09-18 LAB — POCT URINALYSIS DIPSTICK
Bilirubin, UA: NEGATIVE
Blood, UA: NEGATIVE
Glucose, UA: NEGATIVE
Ketones, UA: NEGATIVE
Leukocytes, UA: NEGATIVE
Nitrite, UA: NEGATIVE
Protein, UA: NEGATIVE
Spec Grav, UA: 1.025 (ref 1.010–1.025)
Urobilinogen, UA: 0.2 E.U./dL
pH, UA: 5.5 (ref 5.0–8.0)

## 2018-09-18 LAB — COMPREHENSIVE METABOLIC PANEL
ALT: 13 U/L (ref 0–35)
AST: 21 U/L (ref 0–37)
Albumin: 4.8 g/dL (ref 3.5–5.2)
Alkaline Phosphatase: 52 U/L (ref 39–117)
BUN: 28 mg/dL — ABNORMAL HIGH (ref 6–23)
CALCIUM: 9.6 mg/dL (ref 8.4–10.5)
CO2: 28 mEq/L (ref 19–32)
CREATININE: 0.81 mg/dL (ref 0.40–1.20)
Chloride: 104 mEq/L (ref 96–112)
GFR: 69.45 mL/min (ref >60.00)
Glucose, Bld: 142 mg/dL — ABNORMAL HIGH (ref 70–99)
Potassium: 3.9 mEq/L (ref 3.5–5.1)
SODIUM: 140 meq/L (ref 135–145)
Total Bilirubin: 0.5 mg/dL (ref 0.2–1.2)
Total Protein: 7.4 g/dL (ref 6.0–8.3)

## 2018-09-18 LAB — SEDIMENTATION RATE: Sed Rate: 14 mm/hr (ref 0–30)

## 2018-09-18 LAB — CBC WITH DIFFERENTIAL/PLATELET
Basophils Absolute: 0 10*3/uL (ref 0.0–0.1)
Basophils Relative: 0.2 % (ref 0.0–3.0)
Eosinophils Absolute: 0 10*3/uL (ref 0.0–0.7)
Eosinophils Relative: 0 % (ref 0.0–5.0)
HCT: 42.7 % (ref 36.0–46.0)
Hemoglobin: 14.4 g/dL (ref 12.0–15.0)
Lymphocytes Relative: 21.9 % (ref 12.0–46.0)
Lymphs Abs: 1.5 10*3/uL (ref 0.7–4.0)
MCHC: 33.8 g/dL (ref 30.0–36.0)
MCV: 88.9 fl (ref 78.0–100.0)
Monocytes Absolute: 0.5 10*3/uL (ref 0.1–1.0)
Monocytes Relative: 8 % (ref 3.0–12.0)
Neutro Abs: 4.7 10*3/uL (ref 1.4–7.7)
Neutrophils Relative %: 69.9 % (ref 43.0–77.0)
Platelets: 206 10*3/uL (ref 150.0–400.0)
RBC: 4.8 Mil/uL (ref 3.87–5.11)
RDW: 14 % (ref 11.5–15.5)
WBC: 6.7 10*3/uL (ref 4.0–10.5)

## 2018-09-18 LAB — TSH: TSH: 2.47 u[IU]/mL (ref 0.35–4.50)

## 2018-09-18 NOTE — Patient Instructions (Addendum)
Nice to see you. We will get your mammogram set up. If you do not hear about this in the next several days please let us know.

## 2018-09-18 NOTE — Progress Notes (Signed)
Tommi Rumps, MD Phone: [phone]  Pam Salazar is a 73 y.o. female who presents today for same day visit.   Breast lesion: Patient notes she noted this about a month ago to start with.  Notes its in her upper outer quadrant.  Notes it was tender though that is resolved.  Notes she feels it best when she is sitting up.  She notes no injury to this area.  She notes no personal history of breast cancer though does have a family history in her maternal aunt and sister.  She notes no nipple discharge.  Weight loss: Patient notes her weight has trended down about 9 pounds over a number of months.  She has not been trying to lose weight.  She notes no chest pain, shortness of breath, night sweats, abdominal pain, nausea, vomiting, diarrhea, urinary symptoms, depression, or anxiety.  She notes overall she feels quite well and has no symptoms.  We have increased her Synthroid several months ago.  Social History   Tobacco Use  Smoking Status Former Smoker  . Types: Cigarettes  Smokeless Tobacco Former Systems developer  . Quit date: 07/26/2013  Tobacco Comment         ROS see history of present illness  Objective  Physical Exam Vitals:   09/18/18 1326  BP: 92/70  Pulse: 72  Temp: (!) 96 F (35.6 C)  SpO2: 96%    BP Readings from Last 3 Encounters:  09/18/18 92/70  08/20/18 90/60  06/18/18 90/62   Wt Readings from Last 3 Encounters:  09/18/18 126 lb 6.4 oz (57.3 kg)  08/20/18 126 lb 9.6 oz (57.4 kg)  06/18/18 129 lb 9.6 oz (58.8 kg)    Physical Exam Constitutional:      General: She is not in acute distress.    Appearance: She is not diaphoretic.  Cardiovascular:     Rate and Rhythm: Normal rate and regular rhythm.     Heart sounds: Normal heart sounds.  Pulmonary:     Effort: Pulmonary effort is normal.     Breath sounds: Normal breath sounds.  Chest:    Abdominal:     General: Bowel sounds are normal. There is no distension.     Palpations: Abdomen is soft. There is no  mass.     Tenderness: There is no abdominal tenderness. There is no guarding or rebound.  Lymphadenopathy:     Head:     Right side of head: No submental or submandibular adenopathy.     Left side of head: No submental or submandibular adenopathy.     Cervical: No cervical adenopathy.     Upper Body:     Right upper body: No supraclavicular or axillary adenopathy.     Left upper body: No supraclavicular or axillary adenopathy.  Skin:    General: Skin is warm and dry.  Neurological:     Mental Status: She is alert.  Psychiatric:        Mood and Affect: Mood normal.      Assessment/Plan: Please see individual problem list.  Breast lesion Right diagnostic mammogram and ultrasound ordered.  Scheduled for 09/21/2018.  Unintentional weight loss Mild weight loss over a number of months.  This may be related to increasing her Synthroid dose.  She has no obvious other symptoms with the exception of the possible breast lesion.  Relatively benign exam with the exception of the possible breast lesion.  We will obtain lab work as outlined below to start with.   Orders  Placed This Encounter  Procedures  . MM DIAG BREAST TOMO UNI RIGHT    Standing Status:   Future    Standing Expiration Date:   11/18/2019    Order Specific Question:   Reason for Exam (SYMPTOM  OR DIAGNOSIS REQUIRED)    Answer:   right upper outer quadrant tenderness and previously palpable nodule    Order Specific Question:   Preferred imaging location?    Answer:   [facility]  . US BREAST LTD UNI RIGHT INC AXILLA    Standing Status:   Future    Standing Expiration Date:   11/18/2019    Order Specific Question:   Reason for Exam (SYMPTOM  OR DIAGNOSIS REQUIRED)    Answer:   right upper outer quadrant tenderness and previously palpable nodule    Order Specific Question:   Preferred imaging location?    Answer:   [facility]  . CBC w/Diff  . Comp Met (CMET)  . TSH  . Sedimentation rate  . POCT Urinalysis  Dipstick    No orders of the defined types were placed in this encounter.    Tommi Rumps, MD [place]

## 2018-09-18 NOTE — Progress Notes (Unsigned)
  Tommi Rumps, MD Phone: [phone]  Pam Salazar is a 73 y.o. female who presents today for ***  ***  Social History   Tobacco Use  Smoking Status Former Smoker  . Types: Cigarettes  Smokeless Tobacco Former Systems developer  . Quit date: 07/26/2013  Tobacco Comment         ROS see history of present illness  Objective  Physical Exam There were no vitals filed for this visit.  BP Readings from Last 3 Encounters:  09/18/18 92/70  08/20/18 90/60  06/18/18 90/62   Wt Readings from Last 3 Encounters:  09/18/18 126 lb 6.4 oz (57.3 kg)  08/20/18 126 lb 9.6 oz (57.4 kg)  06/18/18 129 lb 9.6 oz (58.8 kg)    Physical Exam   Assessment/Plan: Please see individual problem list.  No problem-specific Assessment & Plan notes found for this encounter.   Health Maintenance: ***  No orders of the defined types were placed in this encounter.   No orders of the defined types were placed in this encounter.    Tommi Rumps, MD [place]

## 2018-09-19 ENCOUNTER — Other Ambulatory Visit: Payer: Self-pay | Admitting: Family Medicine

## 2018-09-19 DIAGNOSIS — N649 Disorder of breast, unspecified: Principal | ICD-10-CM

## 2018-09-19 DIAGNOSIS — R7309 Other abnormal glucose: Secondary | ICD-10-CM

## 2018-09-19 DIAGNOSIS — R0789 Other chest pain: Secondary | ICD-10-CM | POA: Insufficient documentation

## 2018-09-19 DIAGNOSIS — R634 Abnormal weight loss: Secondary | ICD-10-CM | POA: Insufficient documentation

## 2018-09-19 NOTE — Assessment & Plan Note (Signed)
Right diagnostic mammogram and ultrasound ordered.  Scheduled for 09/21/2018.

## 2018-09-19 NOTE — Assessment & Plan Note (Signed)
Mild weight loss over a number of months.  This may be related to increasing her Synthroid dose.  She has no obvious other symptoms with the exception of the possible breast lesion.  Relatively benign exam with the exception of the possible breast lesion.  We will obtain lab work as outlined below to start with.

## 2018-09-21 ENCOUNTER — Ambulatory Visit
Admission: RE | Admit: 2018-09-21 | Discharge: 2018-09-21 | Disposition: A | Discharge status: Home / Self Care | Payer: Medicare Other | Source: Ambulatory Visit | Attending: Family Medicine | Admitting: Family Medicine

## 2018-09-21 ENCOUNTER — Other Ambulatory Visit: Payer: Self-pay

## 2018-09-21 DIAGNOSIS — R922 Inconclusive mammogram: Secondary | ICD-10-CM | POA: Diagnosis not present

## 2018-09-21 DIAGNOSIS — N649 Disorder of breast, unspecified: Secondary | ICD-10-CM

## 2018-09-24 ENCOUNTER — Telehealth: Payer: Self-pay

## 2018-09-24 NOTE — Telephone Encounter (Signed)
Copied from [place] [phone]. Topic: General - Call Back - No Documentation >> Sep 24, 2018  2:41 PM Sheran Luz wrote: Reason for CRM: Patient is returning call to [place].

## 2018-09-25 ENCOUNTER — Other Ambulatory Visit (INDEPENDENT_AMBULATORY_CARE_PROVIDER_SITE_OTHER): Payer: Medicare Other

## 2018-09-25 ENCOUNTER — Other Ambulatory Visit: Payer: Self-pay

## 2018-09-25 DIAGNOSIS — R7309 Other abnormal glucose: Secondary | ICD-10-CM | POA: Diagnosis not present

## 2018-09-25 LAB — HEMOGLOBIN A1C: Hgb A1c MFr Bld: 5.7 % (ref 4.6–6.5)

## 2018-09-25 NOTE — Telephone Encounter (Signed)
Will call pt back to go over MM results

## 2018-10-08 ENCOUNTER — Other Ambulatory Visit: Payer: Self-pay | Admitting: Family Medicine

## 2018-10-09 NOTE — Telephone Encounter (Signed)
Refilled: 09/09/2018 Last OV: 09/18/2018 Next OV: 01/15/2019

## 2018-11-11 ENCOUNTER — Encounter: Payer: Self-pay | Admitting: Family Medicine

## 2018-11-12 NOTE — Telephone Encounter (Signed)
Your A1c is barely in the prediabetic range at 5.7. Any value from 5.7-6.4 is in the prediabetic range. You need to monitor your diet and maintain your physical activity to help prevent this value from worsening. I have included some dietary guidelines below. Please look at them and try to incorporate a few changes.    Per your notes   Sent to PCP to advise if recheck of A1c is needed

## 2018-11-26 ENCOUNTER — Other Ambulatory Visit: Payer: Self-pay | Admitting: Family Medicine

## 2018-11-26 DIAGNOSIS — Z1231 Encounter for screening mammogram for malignant neoplasm of breast: Secondary | ICD-10-CM

## 2018-11-30 ENCOUNTER — Other Ambulatory Visit: Payer: Self-pay | Admitting: Family Medicine

## 2018-11-30 DIAGNOSIS — E039 Hypothyroidism, unspecified: Secondary | ICD-10-CM

## 2018-12-09 ENCOUNTER — Other Ambulatory Visit: Payer: Self-pay | Admitting: Family Medicine

## 2018-12-10 DIAGNOSIS — L821 Other seborrheic keratosis: Secondary | ICD-10-CM | POA: Diagnosis not present

## 2018-12-10 DIAGNOSIS — Z872 Personal history of diseases of the skin and subcutaneous tissue: Secondary | ICD-10-CM | POA: Diagnosis not present

## 2018-12-10 DIAGNOSIS — Z86018 Personal history of other benign neoplasm: Secondary | ICD-10-CM | POA: Diagnosis not present

## 2018-12-10 DIAGNOSIS — Z85828 Personal history of other malignant neoplasm of skin: Secondary | ICD-10-CM | POA: Diagnosis not present

## 2018-12-10 DIAGNOSIS — L578 Other skin changes due to chronic exposure to nonionizing radiation: Secondary | ICD-10-CM | POA: Diagnosis not present

## 2018-12-11 NOTE — Telephone Encounter (Signed)
Patient calling to check status of refills.

## 2018-12-14 DIAGNOSIS — H353131 Nonexudative age-related macular degeneration, bilateral, early dry stage: Secondary | ICD-10-CM | POA: Diagnosis not present

## 2019-01-11 ENCOUNTER — Other Ambulatory Visit: Payer: Self-pay | Admitting: Family Medicine

## 2019-01-11 ENCOUNTER — Telehealth: Payer: Self-pay | Admitting: Family Medicine

## 2019-01-11 NOTE — Telephone Encounter (Signed)
Copied from [place]. Topic: Quick Communication - Rx Refill/Question >> Jan 11, 2019 10:08 AM Yvette Rack wrote: Medication: clonazePAM (KLONOPIN) 1 MG tablet and simvastatin (ZOCOR) 20 MG tablet  Has the patient contacted their pharmacy? no  Preferred Pharmacy (with phone number or street name): [id number] [address], [place] [phone] (Phone)  [phone] (Fax)  Agent: Please be advised that RX refills may take up to 3 business days. We ask that you follow-up with your pharmacy.

## 2019-01-12 MED ORDER — CLONAZEPAM 1 MG PO TABS: ORAL_TABLET | ORAL | 0 refills | Status: DC

## 2019-01-12 NOTE — Telephone Encounter (Signed)
Patient called to say that due to the recent passing of her brother she forgot to call in her refill. Asking if she can possibly get the Rx sent to the pharmacy today please clonazePAM (KLONOPIN) 1 MG tablet

## 2019-01-12 NOTE — Telephone Encounter (Signed)
Called and spoke to patient.  Pt aware that Rx has been sent for Klonopin.  Pt said that she is still having tenderness in the upper quadrant of her right breast.  Pt said that it never went away after she had the diagnostic mammogram back in March.  Pt does not notice any other changes to breast.  Pt said that she is due for her annual mammogram and has a scheduled appt tomorrow.  Pt says that she also has an appt w/ Dr. Caryl Bis on Friday.

## 2019-01-12 NOTE — Telephone Encounter (Signed)
Noted. Please see if the patient is willing to come in for the follow-up visit in the office so I can complete another exam.

## 2019-01-12 NOTE — Telephone Encounter (Signed)
I have sent this to her pharmacy.  Please let her know this.  Please also see if she has had any recurrence of the area in her breast that we evaluated previously.  It looks like she is having a screening mammogram soon.

## 2019-01-13 ENCOUNTER — Ambulatory Visit
Admission: RE | Admit: 2019-01-13 | Discharge: 2019-01-13 | Disposition: A | Discharge status: Home / Self Care | Payer: Medicare Other | Source: Ambulatory Visit | Attending: Family Medicine | Admitting: Family Medicine

## 2019-01-13 ENCOUNTER — Telehealth: Payer: Self-pay

## 2019-01-13 ENCOUNTER — Other Ambulatory Visit: Payer: Self-pay

## 2019-01-13 DIAGNOSIS — Z1231 Encounter for screening mammogram for malignant neoplasm of breast: Secondary | ICD-10-CM | POA: Insufficient documentation

## 2019-01-13 NOTE — Telephone Encounter (Signed)
Pt ok with appt w/ Dr. Caryl Bis on 01/15/19 being in-person.    Pt will drive to clinic and conduct telephone visit for Wellness Visit w/ Denisa at clinic parking lot in her car @ 10:00 am before her scheduled in-person appt w/ Dr. Caryl Bis @ 10:30 am.  Pt completed COVID screening questions. No symptoms.

## 2019-01-13 NOTE — Telephone Encounter (Signed)
It can be made an in person visit.

## 2019-01-13 NOTE — Telephone Encounter (Signed)
This was sent to her pharmacy yesterday.

## 2019-01-13 NOTE — Telephone Encounter (Signed)
Called and spoke to pt.  Pt said that she has an appt scheduled with you on 7/24 @ 10:30 am.  Wants to know if you want to make this an in-person visit?    Noticed that she also has a phone wellness visit with Denisa on the same day @ 10:00 am.

## 2019-01-15 ENCOUNTER — Other Ambulatory Visit: Payer: Self-pay | Admitting: Family Medicine

## 2019-01-15 ENCOUNTER — Ambulatory Visit (INDEPENDENT_AMBULATORY_CARE_PROVIDER_SITE_OTHER): Payer: Medicare Other

## 2019-01-15 ENCOUNTER — Encounter: Payer: Self-pay | Admitting: Family Medicine

## 2019-01-15 ENCOUNTER — Other Ambulatory Visit: Payer: Self-pay

## 2019-01-15 ENCOUNTER — Ambulatory Visit (INDEPENDENT_AMBULATORY_CARE_PROVIDER_SITE_OTHER): Payer: Medicare Other | Admitting: Family Medicine

## 2019-01-15 VITALS — BP 90/60 | HR 68 | Temp 98.1°F | Ht 62.5 in | Wt 126.1 lb

## 2019-01-15 DIAGNOSIS — F419 Anxiety disorder, unspecified: Secondary | ICD-10-CM | POA: Diagnosis not present

## 2019-01-15 DIAGNOSIS — R7303 Prediabetes: Secondary | ICD-10-CM | POA: Diagnosis not present

## 2019-01-15 DIAGNOSIS — N3941 Urge incontinence: Secondary | ICD-10-CM | POA: Diagnosis not present

## 2019-01-15 DIAGNOSIS — Z0000 Encounter for general adult medical examination without abnormal findings: Secondary | ICD-10-CM

## 2019-01-15 DIAGNOSIS — F32A Depression, unspecified: Secondary | ICD-10-CM

## 2019-01-15 DIAGNOSIS — F329 Major depressive disorder, single episode, unspecified: Secondary | ICD-10-CM | POA: Diagnosis not present

## 2019-01-15 DIAGNOSIS — R32 Unspecified urinary incontinence: Secondary | ICD-10-CM | POA: Insufficient documentation

## 2019-01-15 DIAGNOSIS — M1990 Unspecified osteoarthritis, unspecified site: Secondary | ICD-10-CM

## 2019-01-15 DIAGNOSIS — R0789 Other chest pain: Secondary | ICD-10-CM | POA: Diagnosis not present

## 2019-01-15 LAB — POCT GLYCOSYLATED HEMOGLOBIN (HGB A1C): Hemoglobin A1C: 5.2 % (ref 4.0–5.6)

## 2019-01-15 MED ORDER — MELOXICAM 15 MG PO TABS: 15.0000 mg | ORAL_TABLET | Freq: Every day | ORAL | 0 refills | Status: DC | PRN

## 2019-01-15 MED ORDER — TETANUS-DIPHTH-ACELL PERTUSSIS 5-2.5-18.5 LF-MCG/0.5 IM SUSP: 0.5000 mL | Freq: Once | INTRAMUSCULAR | 0 refills | Status: AC

## 2019-01-15 NOTE — Assessment & Plan Note (Signed)
Occasional joint pains respond to as needed Mobic use.  She can continue this.  Discussed taking it with food.  If she starts more frequently she knows that we will need to check her kidney function.

## 2019-01-15 NOTE — Assessment & Plan Note (Signed)
Some anxiety and grief.  Offered support.  She will continue her current regimen.  Offered referral for grief counseling if she desires it in the future.

## 2019-01-15 NOTE — Assessment & Plan Note (Addendum)
Check A1c.  Patient reports she has cut down on sugar intake.

## 2019-01-15 NOTE — Assessment & Plan Note (Signed)
Mild infrequent urge incontinence morning.  Discussed decreasing fluid intake the 1 to 2 hours before bed.  Given infrequent episodes I do not think the benefit of medication would outweigh the risk of medication to the patient.

## 2019-01-15 NOTE — Progress Notes (Signed)
Pam Rumps, MD Phone: [phone]  LATEASHA Salazar is a 73 y.o. female who presents today for follow-up.  Chest tenderness: This has been an ongoing intermittent issue.  She has no pain other than when she presses on her right chest.  She had a possible breast lesion previously on exam though she was unable to find this when she had her diagnostic mammogram and ultrasound and the radiologist was unable to palpate this as well.  She had a negative diagnostic mammogram and ultrasound and a recent negative screening mammogram.  She notes that she is just tender in her right chest.  No lesions.  No discharge.  No personal history of breast cancer but does have a family history of breast cancer.  Urine incontinence: Patient notes this only occurs in the morning when her bladder is full after she gets out of bed.  She will dribble just a little bit when she gets up and then she will get to the bathroom fine.  Has been going on for a couple of months.  Does not occur very frequently.  No dysuria, frequency, urgency, or hematuria.  No stress incontinence.  Osteoarthritis: She has scattered areas of arthritis particularly in her hands.  She has been using meloxicam for this.  She received the meloxicam following a shoulder issue though she rarely takes this.  No stomach upset with this.  Anxiety/grief: She notes she is doing relatively well with this.  Her brother passed away last week from pancreatic cancer.  She has support from his daughters.  Does not think it is depression.  Social History   Tobacco Use  Smoking Status Former Smoker  . Types: Cigarettes  Smokeless Tobacco Former Systems developer  . Quit date: 07/26/2013  Tobacco Comment         ROS see history of present illness  Objective  Physical Exam Vitals:   01/15/19 1041  BP: 90/60  Pulse: 68  Temp: 98.1 F (36.7 C)  SpO2: 96%    BP Readings from Last 3 Encounters:  01/15/19 90/60  09/18/18 92/70  08/20/18 90/60   Wt Readings  from Last 3 Encounters:  01/15/19 126 lb 1.9 oz (57.2 kg)  09/18/18 126 lb 6.4 oz (57.3 kg)  08/20/18 126 lb 9.6 oz (57.4 kg)    Physical Exam Constitutional:      General: She is not in acute distress.    Appearance: She is not diaphoretic.  Cardiovascular:     Rate and Rhythm: Normal rate and regular rhythm.     Heart sounds: Normal heart sounds.  Pulmonary:     Effort: Pulmonary effort is normal.     Breath sounds: Normal breath sounds.  Chest:       Comments: Tender in the area of the line on deep palpation Genitourinary:    Comments: Chaperone used, bilateral breast with no masses, skin changes, or nipple inversion, there is tenderness of the chest wall in the midportion of the right chest, no palpable defects, no axillary masses Skin:    General: Skin is warm and dry.  Neurological:     Mental Status: She is alert.      Assessment/Plan: Please see individual problem list.  Chest wall tenderness I suspect her tenderness is related to chest wall tenderness. She has a benign breast exam and has had negative breast imaging. She will trial mobic for 5 days. She will take this with food. If not improving over the next 1-2 weeks she will let us know  and we could consider CXR to evaluate for an underlying cause.   Anxiety and depression Some anxiety and grief.  Offered support.  She will continue her current regimen.  Offered referral for grief counseling if she desires it in the future.  Urine incontinence Mild infrequent urge incontinence morning.  Discussed decreasing fluid intake the 1 to 2 hours before bed.  Given infrequent episodes I do not think the benefit of medication would outweigh the risk of medication to the patient.  Arthritis Occasional joint pains respond to as needed Mobic use.  She can continue this.  Discussed taking it with food.  If she starts more frequently she knows that we will need to check her kidney function.  Prediabetes Check A1c.  Patient  reports she has cut down on sugar intake.   Orders Placed This Encounter  Procedures  . POCT HgB A1C    Meds ordered this encounter  Medications  . Tdap (BOOSTRIX) 5-2.5-18.5 LF-MCG/0.5 injection    Sig: Inject 0.5 mLs into the muscle once for 1 dose.    Dispense:  0.5 mL    Refill:  0  . meloxicam (MOBIC) 15 MG tablet    Sig: Take 1 tablet (15 mg total) by mouth daily as needed for pain. Take with food.    Dispense:  30 tablet    Refill:  0     Pam Rumps, MD [place]

## 2019-01-15 NOTE — Patient Instructions (Addendum)
Nice to see you. Please try the meloxicam once daily for the next 5 days for your likely chest wall tenderness.  You can then take it as needed for any arthritic pain.  Please take it with food.  If this is not improving over the next couple of weeks please let us know. Please try to decrease her fluid intake to 1 to 2 hours prior to bedtime to see if that helps with your urine issues.

## 2019-01-15 NOTE — Progress Notes (Signed)
Subjective:   Pam Salazar is a 73 y.o. female who presents for Medicare Annual (Subsequent) preventive examination.  Review of Systems:  No ROS.  Medicare Wellness Virtual Visit.  Visual/audio telehealth visit, UTA vital signs.   See social history for additional risk factors.   Cardiac Risk Factors include: advanced age (>32men, >40 women)     Objective:     Vitals: There were no vitals taken for this visit.  There is no height or weight on file to calculate BMI.  Advanced Directives 01/15/2019 01/13/2018 12/22/2017 04/10/2017 07/11/2015  Does Patient Have a Medical Advance Directive? Yes Yes Yes No Yes  Type of Paramedic of [place];Living will Healthcare Power of [place];Living will  Does patient want to make changes to medical advance directive? No - Patient declined No - Patient declined - - No - Patient declined  Copy of [place] in Chart? No - copy requested No - copy requested - - No - copy requested    Tobacco Social History   Tobacco Use  Smoking Status Former Smoker  . Types: Cigarettes  Smokeless Tobacco Former Systems developer  . Quit date: 07/26/2013  Tobacco Comment         Counseling given: Not Answered Comment:     Clinical Intake:  Pre-visit preparation completed: Yes        Diabetes: No  How often do you need to have someone help you when you read instructions, pamphlets, or other written materials from your doctor or pharmacy?: 1 - Never  Interpreter Needed?: No     Past Medical History:  Diagnosis Date  . Arthritis    hips, lower back  . Breast lump 10/21/2011   Overview:  Negative diagnostic mammogram 09/2011   . Depression   . Fracture of rib of right side 08/17/2013  . Hypercholesterolemia   . Hypothyroidism   . Low blood pressure 10/25/2014  . Panic disorder    previous agoraphobia  . Seizures (HCC)    febrile - as infant  . Shoulder  pain, left    bursa  . Vertigo    positional - rare  . Vitamin D deficiency    Past Surgical History:  Procedure Laterality Date  . ABDOMINAL HYSTERECTOMY  1981   partial - secondary to bleeding, ovaries not removed -   . CATARACT EXTRACTION W/ INTRAOCULAR LENS  IMPLANT, BILATERAL    . COLONOSCOPY WITH PROPOFOL N/A 05/05/2015   Procedure: COLONOSCOPY WITH PROPOFOL;  Surgeon: Lucilla Lame, MD;  Location: [facility];  Service: Endoscopy;  Laterality: N/A;  . MASS EXCISION Right 12/24/2017   Procedure: EXCISION MASS ( SHIN );  Surgeon: Katha Cabal, MD;  Location: [facility];  Service: Vascular;  Laterality: Right;  . ORIF FIBULA FRACTURE Right 2003   right lower extremitiy  . shoulder Left    bone suprs  . SHOULDER SURGERY  12/2015  . TONSILLECTOMY  1974   Family History  Problem Relation Age of Onset  . Arthritis Mother   . Hyperlipidemia Mother   . Arthritis Father   . Prostate cancer Father   . Breast cancer Sister 30  . Heart disease Brother        heart attack  . Diabetes Brother   . Breast cancer Maternal Aunt 85  . Colon cancer Maternal Uncle    Social History   Socioeconomic History  . Marital status: Married  Spouse name: Not on file  . Number of children: Not on file  . Years of education: Not on file  . Highest education level: Not on file  Occupational History  . Not on file  Social Needs  . Financial resource strain: Not hard at all  . Food insecurity    Worry: Never true    Inability: Never true  . Transportation needs    Medical: No    Non-medical: No  Tobacco Use  . Smoking status: Former Smoker    Types: Cigarettes  . Smokeless tobacco: Former Systems developer    Quit date: 07/26/2013  . Tobacco comment:    Substance and Sexual Activity  . Alcohol use: No    Alcohol/week: 0.0 standard drinks  . Drug use: No  . Sexual activity: Yes    Birth control/protection: Post-menopausal  Lifestyle  . Physical activity    Days per week: 3 days     Minutes per session: 20 min  . Stress: Very much  Relationships  . Social connections    Talks on phone: More than three times a week    Gets together: Once a week    Attends religious service: More than 4 times per year    Active member of club or organization: Yes    Attends meetings of clubs or organizations: More than 4 times per year    Relationship status: Married  Other Topics Concern  . Not on file  Social History Narrative  . Not on file    Outpatient Encounter Medications as of 01/15/2019  Medication Sig  . clonazePAM (KLONOPIN) 1 MG tablet TAKE 1 TABLET(1 MG) BY MOUTH TWICE DAILY AS NEEDED FOR ANXIETY  . diphenhydramine-acetaminophen (TYLENOL PM) 25-500 MG TABS tablet Take 1 tablet by mouth at bedtime.   Marland Kitchen levothyroxine (SYNTHROID, LEVOTHROID) 112 MCG tablet Take 1 tablet (112 mcg total) by mouth daily before breakfast.  . Melatonin 3 MG CAPS Take 1 capsule by mouth at bedtime as needed.  . Multiple Vitamins-Minerals (PRESERVISION AREDS 2 PO) Take 1 capsule by mouth 2 (two) times daily.   . sertraline (ZOLOFT) 100 MG tablet TAKE 1 AND 1/2 TABLETS(150 MG) BY MOUTH DAILY  . simvastatin (ZOCOR) 20 MG tablet TAKE 1 TABLET BY MOUTH EVERY EVENING  . Vitamin D, Cholecalciferol, 25 MCG (1000 UT) TABS Take 1 capsule by mouth daily.  Marland Kitchen gabapentin (NEURONTIN) 300 MG capsule Take 1 capsule (300 mg total) by mouth at bedtime. (Patient not taking: Reported on 01/15/2019)   No facility-administered encounter medications on file as of 01/15/2019.     Activities of Daily Living In your present state of health, do you have any difficulty performing the following activities: 01/15/2019  Hearing? N  Vision? N  Difficulty concentrating or making decisions? N  Walking or climbing stairs? N  Dressing or bathing? N  Doing errands, shopping? N  Preparing Food and eating ? N  Using the Toilet? N  In the past six months, have you accidently leaked urine? Y  Comment Intermittent leaking at  bedtime  Do you have problems with loss of bowel control? N  Managing your Medications? N  Managing your Finances? N  Housekeeping or managing your Housekeeping? N  Some recent data might be hidden    Patient Care Team: Leone Haven, MD as PCP - General (Family Medicine) Christene Lye, MD as Consulting Physician (General Surgery) Einar Pheasant, MD (Internal Medicine)    Assessment:   This is a routine wellness examination  for Kalan.  I connected with patient 01/15/19 at 10:00 AM EDT by a video/audio enabled telemedicine application and verified that I am speaking with the correct person using two identifiers. Patient stated full name and DOB. Patient gave permission to continue with virtual visit. Patient's location was at home and Nurse's location was at [place] office.   Health Screenings  Mammogram 3D - 12/2018 Colonoscopy - 04/2015 Bone Density - 08/2015 Glaucoma -none Early stages of macular degeneration- yes Hearing -demonstrates normal hearing during visit. Hemoglobin A1C - 09/2018 (5.7) Cholesterol - 04/2018  TSH- 08/2018 Hepatitis C Screening- 04/2017 Dental- visits every 6 months Vision- visits within the last 12 months  Social  Alcohol intake - no        Smoking history- former   Smokers in home? none Illicit drug use? none Exercise - Walking the dog Diet - Protein drinks. Regular diet Sexually Active -yes BMI- discussed the importance of a healthy diet, water intake and the benefits of aerobic exercise.  Educational material provided.   Safety  Patient feels safe at home- yes Patient does have smoke detectors at home- yes Patient does wear sunscreen or protective clothing when in direct sunlight -yes Patient does wear seat belt when in a moving vehicle -yes Patient drives- yes  [id number] precautions and sickness symptoms discussed.   Activities of Daily Living Patient denies needing assistance with: driving, household chores, feeding  themselves, getting from bed to chair, getting to the toilet, bathing/showering, dressing, managing money, or preparing meals.  No new identified risk were noted.    Depression Screen Patient denies losing interest in daily life, feeling hopeless, or crying easily over simple problems.   Medication-taking as directed and without issues.   Fall Screen Patient denies being afraid of falling or falling in the last year.   Memory Screen Patient is alert.  Patient denies difficulty focusing, concentrating or misplacing items. Correctly identified the president of the [place], season and recall. Patient likes to read and play paint by numbers for brain stimulation.  Immunizations The following Immunizations were discussed: Influenza, shingles, pneumonia, and tetanus. She plans to complete tdap at her local pharmacy. Notes Rx has been provided by her doctor.   Other Providers Patient Care Team: Leone Haven, MD as PCP - General (Family Medicine) Christene Lye, MD as Consulting Physician (General Surgery) Einar Pheasant, MD (Internal Medicine)  Exercise Activities and Dietary recommendations Current Exercise Habits: Home exercise routine, Type of exercise: walking, Time (Minutes): 20, Frequency (Times/Week): 3, Weekly Exercise (Minutes/Week): 60, Intensity: Mild  Goals      Patient Stated   . DIET - EAT MORE FRUITS AND VEGETABLES (pt-stated)    . Increase physical activity (pt-stated)     Walk more for exercise Strength training exercises       Fall Risk Fall Risk  01/15/2019 01/13/2018 10/22/2017 04/30/2016 07/11/2015  Falls in the past year? 0 No No No Yes  Number falls in past yr: - - - - 1  Injury with Fall? - - - - Yes  Risk for fall due to : - - - - -  Follow up - - - - Education provided;Falls prevention discussed  Is the patient's home free of loose throw rugs in walkways, pet beds, electrical cords, etc? yes      Grab bars in the bathroom? yes      Handrails on  the stairs? yes      Adequate lighting? yes  Depression Screen PHQ 2/9 Scores 01/15/2019  04/22/2018 01/13/2018 04/30/2016  PHQ - 2 Score 1 4 0 0  PHQ- 9 Score - 13 - -     Cognitive Function MMSE - Mini Mental State Exam 01/13/2018 07/11/2015  Orientation to time 5 5  Orientation to Place 5 5  Registration 3 3  Attention/ Calculation 5 5  Recall 3 3  Language- name 2 objects 2 2  Language- repeat 1 1  Language- follow 3 step command 3 3  Language- read & follow direction 1 1  Write a sentence 1 1  Copy design 1 1  Total score 30 30     6CIT Screen 01/15/2019  What Year? 0 points  What month? 0 points  What time? 0 points  Count back from 20 0 points  Months in reverse 0 points    Immunization History  Administered Date(s) Administered  . Influenza Split 06/28/2013, 05/27/2014, 04/21/2017  . Influenza, High Dose Seasonal PF 05/19/2018  . Influenza-Unspecified 05/25/2015, 05/24/2017  . Pneumococcal Conjugate-13 08/03/2013  . Pneumococcal Polysaccharide-23 09/14/2012, 07/11/2015  . Zoster 06/24/2008   Screening Tests Health Maintenance  Topic Date Due  . TETANUS/TDAP  06/03/1965  . INFLUENZA VACCINE  01/23/2019  . MAMMOGRAM  01/13/2020  . COLONOSCOPY  05/04/2025  . DEXA SCAN  Completed  . Hepatitis C Screening  Completed  . PNA vac Low Risk Adult  Completed   End of life planning; Advance aging; Advanced directives discussed.  Copy of current HCPOA/Living Will requested.    I have personally reviewed and noted the following in the patient's chart:   . Medical and social history . Use of alcohol, tobacco or illicit drugs  . Current medications and supplements . Functional ability and status . Nutritional status . Physical activity . Advanced directives . List of other physicians . Hospitalizations, surgeries, and ER visits in previous 12 months . Vitals . Screenings to include cognitive, depression, and falls . Referrals and appointments  In addition, I  have reviewed and discussed with patient certain preventive protocols, quality metrics, and best practice recommendations. A written personalized care plan for preventive services as well as general preventive health recommendations were provided to patient.     Varney Biles, LPN  [id number]

## 2019-01-15 NOTE — Patient Instructions (Addendum)
  Pam Salazar , Thank you for taking time to come for your Medicare Wellness Visit. I appreciate your ongoing commitment to your health goals. Please review the following plan we discussed and let me know if I can assist you in the future.   These are the goals we discussed: Goals      Patient Stated   . DIET - EAT MORE FRUITS AND VEGETABLES (pt-stated)    . Increase physical activity (pt-stated)     Walk more for exercise Strength training exercises       This is a list of the screening recommended for you and due dates:  Health Maintenance  Topic Date Due  . Tetanus Vaccine  06/03/1965  . Flu Shot  01/23/2019  . Mammogram  01/13/2020  . Colon Cancer Screening  05/04/2025  . DEXA scan (bone density measurement)  Completed  .  Hepatitis C: One time screening is recommended by Center for Disease Control  (CDC) for  adults born from [birth year] through 1965.   Completed  . Pneumonia vaccines  Completed

## 2019-01-15 NOTE — Assessment & Plan Note (Signed)
I suspect her tenderness is related to chest wall tenderness. She has a benign breast exam and has had negative breast imaging. She will trial mobic for 5 days. She will take this with food. If not improving over the next 1-2 weeks she will let us know and we could consider CXR to evaluate for an underlying cause.

## 2019-02-08 ENCOUNTER — Other Ambulatory Visit: Payer: Self-pay | Admitting: Family Medicine

## 2019-02-09 ENCOUNTER — Other Ambulatory Visit: Payer: Self-pay | Admitting: Family Medicine

## 2019-02-10 NOTE — Telephone Encounter (Signed)
Patient calling to check status of refill request for clonazepam. Patient states she will take her last tablet tonight. Please advise.

## 2019-02-11 NOTE — Telephone Encounter (Signed)
Patient is calling to get her medication filled in the absence of her PCP.  Called the office and they are going to get the covering Physician to sign off on medication.  Patient stated she has been without the medication and really needs to get it filled - clonazePAM (KLONOPIN) 1 MG tablet

## 2019-02-25 DIAGNOSIS — Z23 Encounter for immunization: Secondary | ICD-10-CM | POA: Diagnosis not present

## 2019-03-05 ENCOUNTER — Encounter: Payer: Self-pay | Admitting: Family Medicine

## 2019-03-09 ENCOUNTER — Encounter: Payer: Self-pay | Admitting: Family Medicine

## 2019-03-09 DIAGNOSIS — N644 Mastodynia: Secondary | ICD-10-CM

## 2019-03-17 ENCOUNTER — Encounter: Payer: Self-pay | Admitting: Family Medicine

## 2019-03-17 ENCOUNTER — Ambulatory Visit: Payer: Medicare Other | Admitting: Surgery

## 2019-04-01 ENCOUNTER — Other Ambulatory Visit: Payer: Self-pay | Admitting: Family Medicine

## 2019-04-09 ENCOUNTER — Other Ambulatory Visit: Payer: Self-pay | Admitting: Family Medicine

## 2019-04-13 IMAGING — CT CT ANGIO CHEST
4 of 7 series · 18 of 46 positions shown · IV contrast (APPLIED)
Comparison: Abdomen and pelvis CT from 03/16/2015.

CLINICAL DATA: Left chest and shoulder pain. Clinical suspicion for
aortic disease.

EXAM:
CT ANGIOGRAPHY CHEST WITH CONTRAST
TECHNIQUE: Multidetector CT imaging of the chest was performed using the
standard protocol during bolus administration of intravenous
contrast. Multiplanar CT image reconstructions and MIPs were
obtained to evaluate the vascular anatomy.
CONTRAST:  75 cc Isovue 370

[Series 4: axial pre · axial · non-contrast · 0.62mm/px · z∈[-129,+86]mm · 5 of 65 slices shown]
[im 11/65  lung]
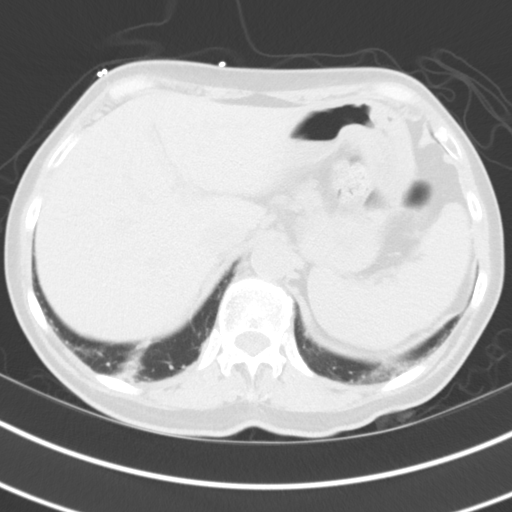
[im 22/65  lung]
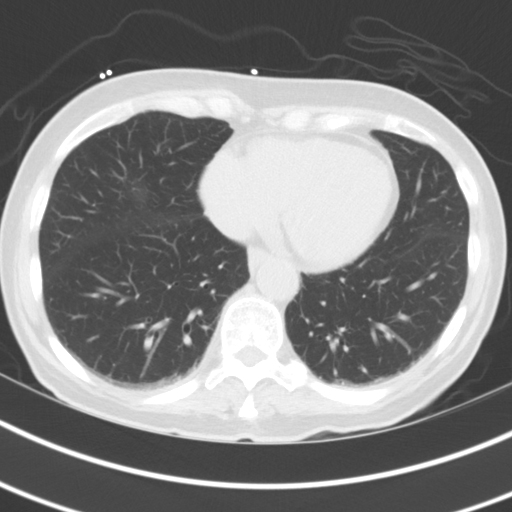
[im 33/65  lung]
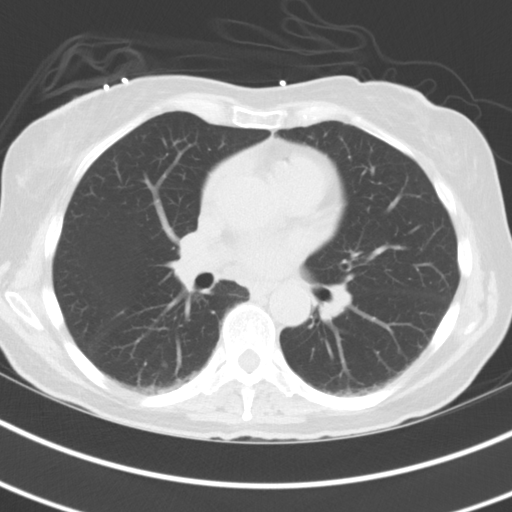
[im 43/65  lung]
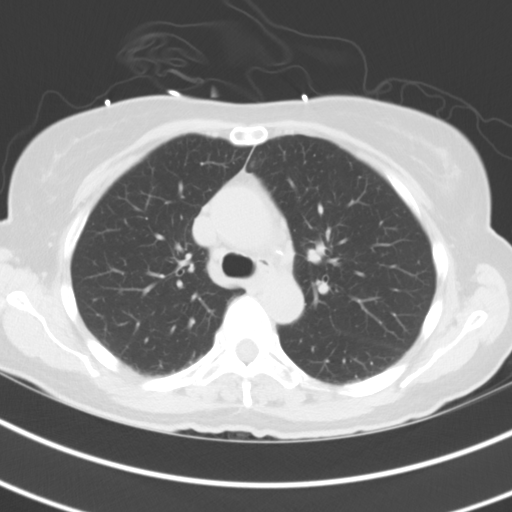
[im 54/65  lung]
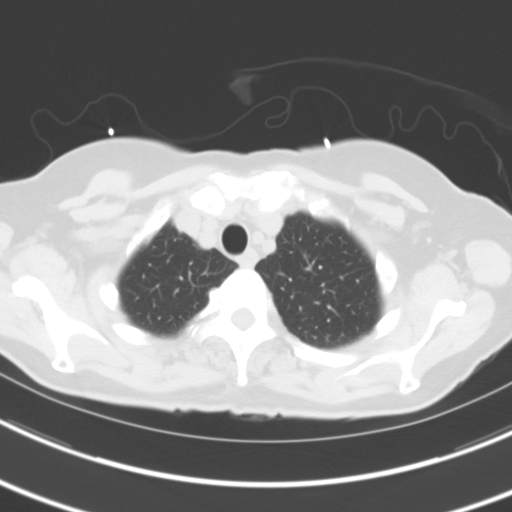

[Series 5: axial arterial · axial · arterial · 0.62mm/px · z∈[-150,+102]mm · 8 of 110 slices shown]
[im 13/110  lung]
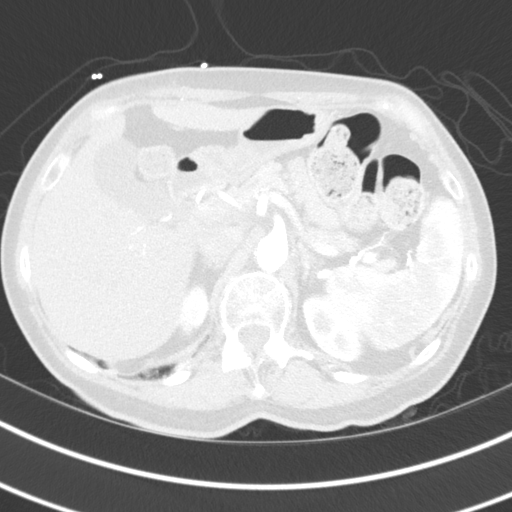
[im 25/110  soft-tissue]
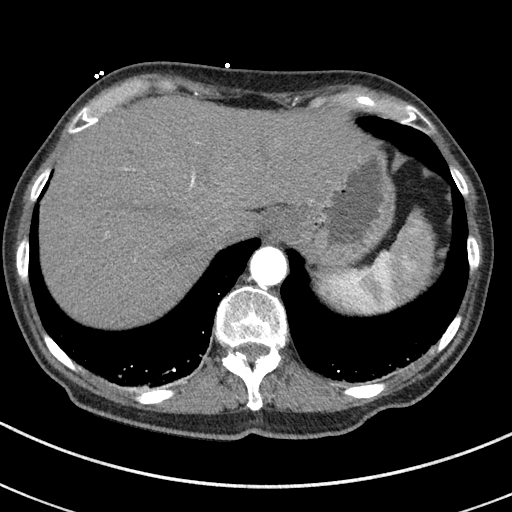
[im 37/110  lung]
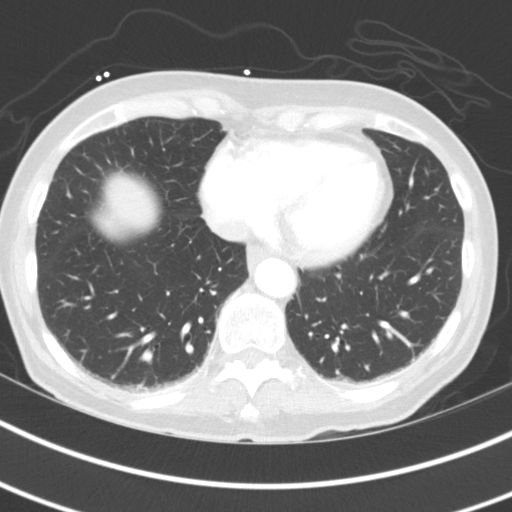
[im 49/110  soft-tissue]
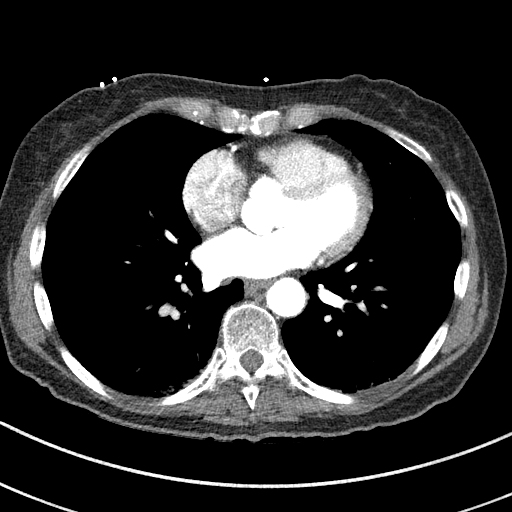
[im 61/110  lung]
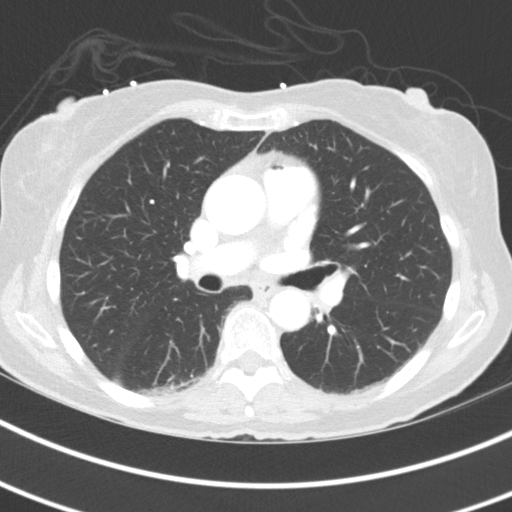
[im 73/110  soft-tissue]
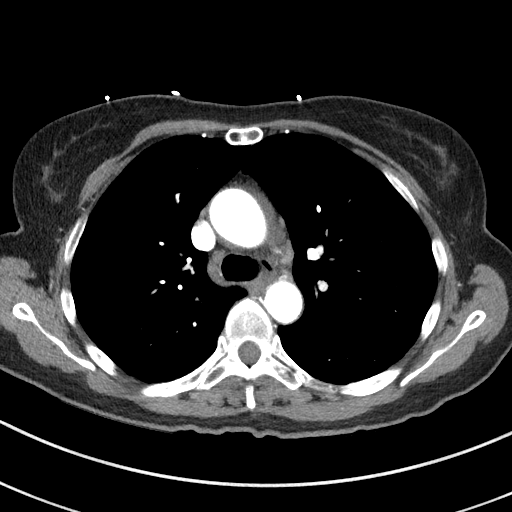
[im 85/110  lung]
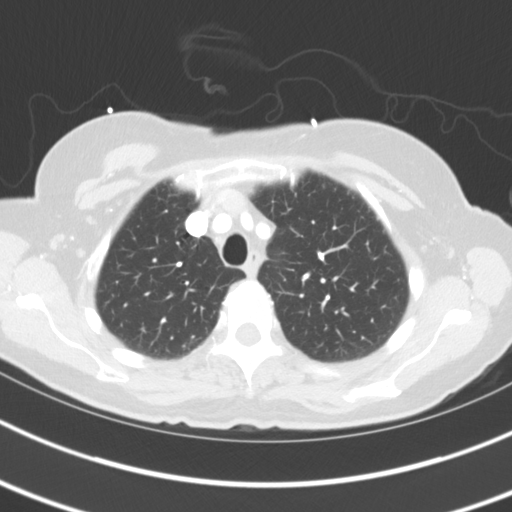
[im 97/110  soft-tissue]
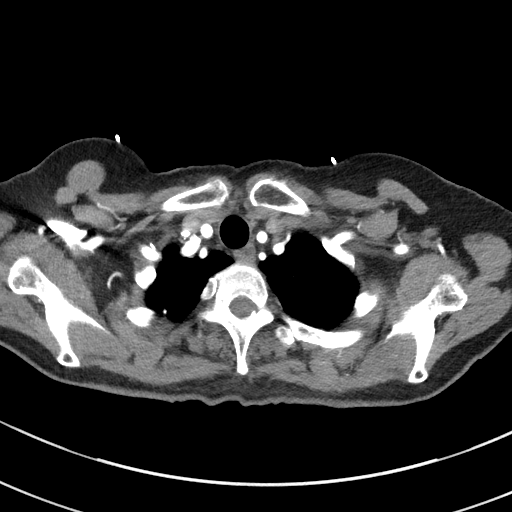

[Series 6: lung · axial · 0.62mm/px · z∈[-163,-117]mm · 2 of 164 slices shown]
[im 12/164  soft-tissue]
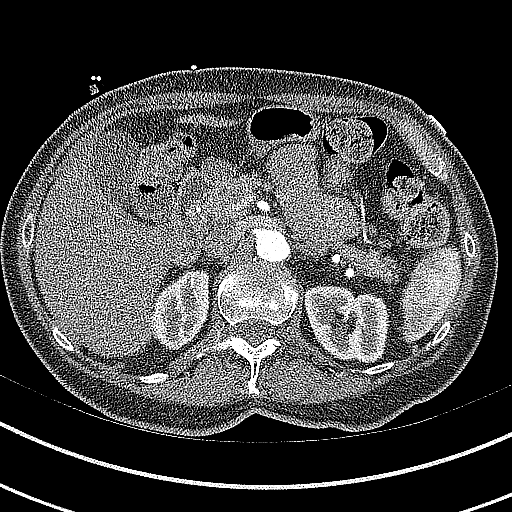
[im 35/164  soft-tissue]
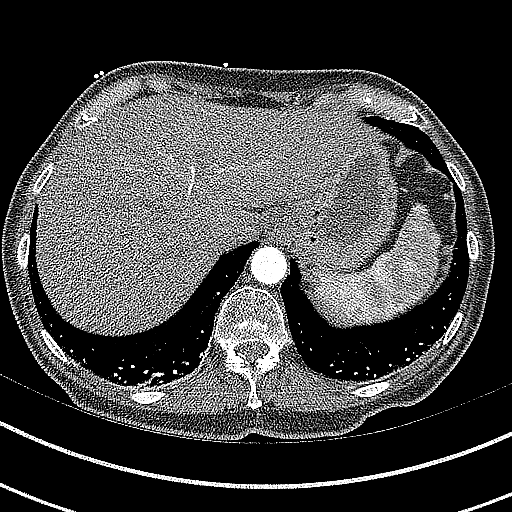

[Series 8: coronals · coronal · 0.68mm/px · 3 of 119 slices shown]
[im 30/119  soft-tissue]
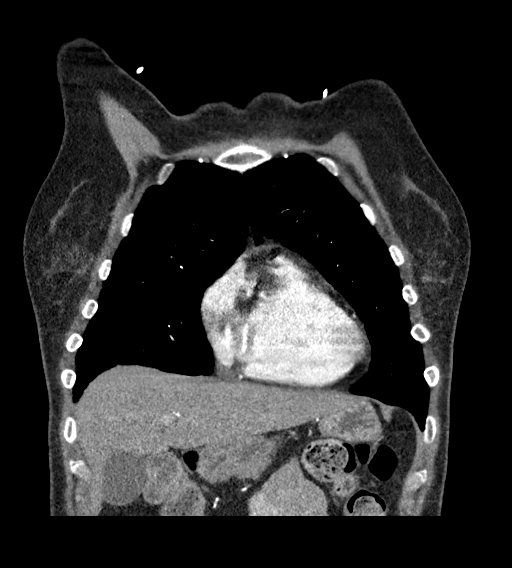
[im 60/119  soft-tissue]
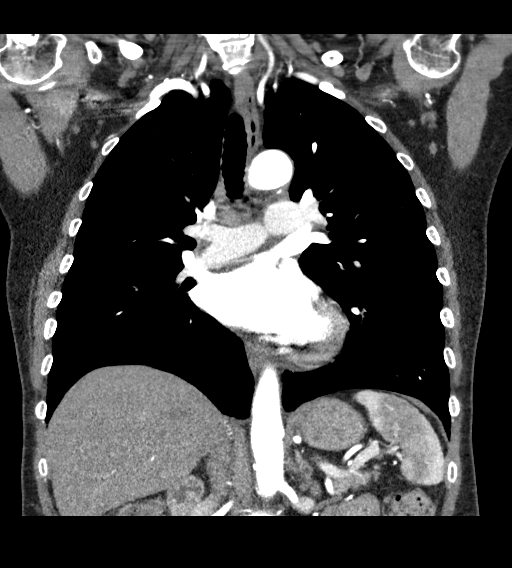
[im 89/119  soft-tissue]
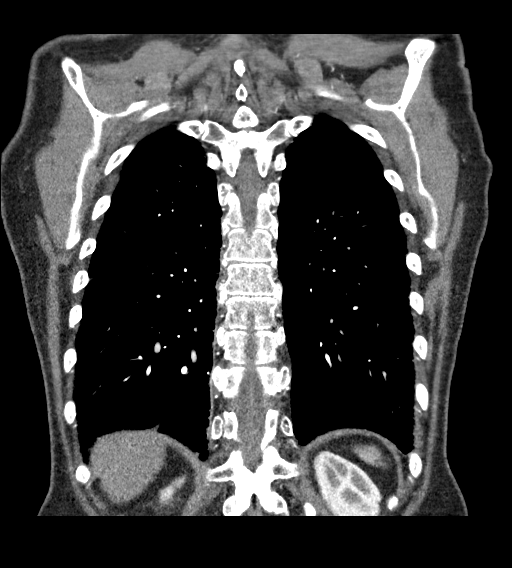

[18 of 46 positions shown; findings below may reference images not displayed]

FINDINGS: Cardiovascular: Precontrast imaging shows no hyperdense crescent in
the wall of the thoracic aorta to suggest acute intramural hematoma.
Imaging after IV contrast material administration shows no
dissection flap within the thoracic aorta. No thoracic aortic
aneurysm with ascending segment measuring 3.6 cm maximum diameter.

Mediastinum/Nodes: No mediastinal lymphadenopathy. There is no hilar
lymphadenopathy. The esophagus has normal imaging features. There is
no axillary lymphadenopathy.

Lungs/Pleura: No focal airspace consolidation. No pulmonary edema or
pleural effusion. Chronic subsegmental atelectasis or scarring in
the posterior lung bases is similar to prior study.

Upper Abdomen: Exophytic cyst interpolar right kidney seen on
abdomen CT is incompletely visualized today.

Musculoskeletal: Bone windows reveal no worrisome lytic or sclerotic
osseous lesions.

Review of the MIP images confirms the above findings.
IMPRESSION: 1. No evidence for thoracic aortic dissection. No acute findings on
today's study to explain the patient's history of chest pain.

## 2019-04-15 ENCOUNTER — Encounter: Payer: Self-pay | Admitting: Family Medicine

## 2019-05-17 ENCOUNTER — Other Ambulatory Visit: Payer: Self-pay

## 2019-05-19 ENCOUNTER — Other Ambulatory Visit: Payer: Self-pay

## 2019-05-19 DIAGNOSIS — Z20828 Contact with and (suspected) exposure to other viral communicable diseases: Secondary | ICD-10-CM | POA: Diagnosis not present

## 2019-05-19 DIAGNOSIS — Z20822 Contact with and (suspected) exposure to covid-19: Secondary | ICD-10-CM

## 2019-05-20 LAB — NOVEL CORONAVIRUS, NAA: SARS-CoV-2, NAA: NOT DETECTED

## 2019-05-28 ENCOUNTER — Other Ambulatory Visit: Payer: Self-pay

## 2019-05-28 DIAGNOSIS — Z20828 Contact with and (suspected) exposure to other viral communicable diseases: Secondary | ICD-10-CM | POA: Diagnosis not present

## 2019-05-28 DIAGNOSIS — Z20822 Contact with and (suspected) exposure to covid-19: Secondary | ICD-10-CM

## 2019-05-29 LAB — NOVEL CORONAVIRUS, NAA: SARS-CoV-2, NAA: NOT DETECTED

## 2019-06-08 ENCOUNTER — Other Ambulatory Visit: Payer: Self-pay | Admitting: Family Medicine

## 2019-06-10 ENCOUNTER — Telehealth: Payer: Self-pay | Admitting: Family Medicine

## 2019-06-10 MED ORDER — CLONAZEPAM 1 MG PO TABS: ORAL_TABLET | ORAL | 1 refills | Status: DC

## 2019-06-10 NOTE — Addendum Note (Signed)
Addended by: Caryl Bis Hayly Litsey G on: 06/10/2019 01:54 PM   Modules accepted: Orders

## 2019-06-10 NOTE — Telephone Encounter (Signed)
Pt wife called about pt needing a refill for clonazePAM (KLONOPIN) 1 MG tablet. Please advise and Thank you!  Pharmacy is [id number] - [address], [place]  Call pt @ [phone].

## 2019-06-15 ENCOUNTER — Other Ambulatory Visit: Payer: Self-pay | Admitting: Family Medicine

## 2019-06-15 ENCOUNTER — Telehealth: Payer: Self-pay

## 2019-06-15 MED ORDER — MELOXICAM 15 MG PO TABS: 15.0000 mg | ORAL_TABLET | Freq: Every day | ORAL | 0 refills | Status: DC | PRN

## 2019-06-15 NOTE — Telephone Encounter (Signed)
Sent to pharmacy 

## 2019-07-08 ENCOUNTER — Encounter: Payer: Self-pay | Admitting: Family Medicine

## 2019-07-11 ENCOUNTER — Other Ambulatory Visit: Payer: Self-pay | Admitting: Family Medicine

## 2019-07-21 ENCOUNTER — Ambulatory Visit: Payer: Medicare Other | Admitting: Family Medicine

## 2019-07-24 ENCOUNTER — Other Ambulatory Visit: Payer: Self-pay | Admitting: Family Medicine

## 2019-07-29 ENCOUNTER — Other Ambulatory Visit: Payer: Self-pay | Admitting: Family Medicine

## 2019-08-06 ENCOUNTER — Ambulatory Visit: Payer: Medicare Other | Attending: Internal Medicine

## 2019-08-06 DIAGNOSIS — Z23 Encounter for immunization: Secondary | ICD-10-CM | POA: Insufficient documentation

## 2019-08-06 NOTE — Progress Notes (Signed)
   Covid-19 Vaccination Clinic  Name:  Pam Salazar    MRN: [id number] DOB: [birth year]  08/06/2019  [name] was observed post Covid-19 immunization for 15 minutes without incidence. She was provided with Vaccine Information Sheet and instruction to access the V-Safe system.   [name] was instructed to call 911 with any severe reactions post vaccine: Marland Kitchen Difficulty breathing  . Swelling of your face and throat  . A fast heartbeat  . A bad rash all over your body  . Dizziness and weakness    Immunizations Administered    Name Date Dose VIS Date Route   Pfizer COVID-19 Vaccine 08/06/2019  8:54 AM 0.3 mL 06/04/2019 Intramuscular   Manufacturer: [place]   Lot: [id number]   [place]: [id number]

## 2019-08-09 ENCOUNTER — Other Ambulatory Visit: Payer: Self-pay | Admitting: Lab

## 2019-08-09 ENCOUNTER — Other Ambulatory Visit: Payer: Self-pay | Admitting: Family Medicine

## 2019-08-10 ENCOUNTER — Other Ambulatory Visit: Payer: Self-pay | Admitting: Family Medicine

## 2019-08-10 NOTE — Telephone Encounter (Signed)
Patient called back stating that she has a dentist appt tomorrow and would like this medication today.  Yancey Pedley,cma

## 2019-08-10 NOTE — Telephone Encounter (Signed)
Pt will be out of clonazepam by tomorrow. Please call into [facility].

## 2019-08-11 ENCOUNTER — Other Ambulatory Visit: Payer: Self-pay | Admitting: Family Medicine

## 2019-08-14 NOTE — Telephone Encounter (Signed)
Medicine was sent to pharmacy 4 days ago.  Pam Salazar,cma

## 2019-08-25 ENCOUNTER — Ambulatory Visit: Payer: Medicare Other | Admitting: Family Medicine

## 2019-08-25 ENCOUNTER — Other Ambulatory Visit: Payer: Self-pay

## 2019-08-25 ENCOUNTER — Ambulatory Visit (INDEPENDENT_AMBULATORY_CARE_PROVIDER_SITE_OTHER): Payer: Medicare Other | Admitting: Family Medicine

## 2019-08-25 ENCOUNTER — Other Ambulatory Visit (HOSPITAL_COMMUNITY)
Admission: RE | Admit: 2019-08-25 | Discharge: 2019-08-25 | Disposition: A | Discharge status: Home / Self Care | Payer: Medicare Other | Source: Ambulatory Visit | Attending: Family Medicine | Admitting: Family Medicine

## 2019-08-25 ENCOUNTER — Encounter: Payer: Self-pay | Admitting: Family Medicine

## 2019-08-25 VITALS — BP 120/70 | HR 87 | Temp 96.2°F | Ht 62.5 in | Wt 129.4 lb

## 2019-08-25 DIAGNOSIS — E7800 Pure hypercholesterolemia, unspecified: Secondary | ICD-10-CM | POA: Diagnosis not present

## 2019-08-25 DIAGNOSIS — E039 Hypothyroidism, unspecified: Secondary | ICD-10-CM

## 2019-08-25 DIAGNOSIS — N898 Other specified noninflammatory disorders of vagina: Secondary | ICD-10-CM | POA: Insufficient documentation

## 2019-08-25 DIAGNOSIS — F419 Anxiety disorder, unspecified: Secondary | ICD-10-CM | POA: Diagnosis not present

## 2019-08-25 DIAGNOSIS — F329 Major depressive disorder, single episode, unspecified: Secondary | ICD-10-CM

## 2019-08-25 DIAGNOSIS — M79675 Pain in left toe(s): Secondary | ICD-10-CM

## 2019-08-25 DIAGNOSIS — N951 Menopausal and female climacteric states: Secondary | ICD-10-CM | POA: Diagnosis not present

## 2019-08-25 LAB — COMPREHENSIVE METABOLIC PANEL
ALT: 15 U/L (ref 0–35)
AST: 25 U/L (ref 0–37)
Albumin: 4.4 g/dL (ref 3.5–5.2)
Alkaline Phosphatase: 55 U/L (ref 39–117)
BUN: 25 mg/dL — ABNORMAL HIGH (ref 6–23)
CO2: 26 mEq/L (ref 19–32)
Calcium: 9.8 mg/dL (ref 8.4–10.5)
Chloride: 105 mEq/L (ref 96–112)
Creatinine, Ser: 0.82 mg/dL (ref 0.40–1.20)
GFR: 68.29 mL/min (ref >60.00)
Glucose, Bld: 90 mg/dL (ref 70–99)
Potassium: 4.2 mEq/L (ref 3.5–5.1)
Sodium: 141 mEq/L (ref 135–145)
Total Bilirubin: 0.6 mg/dL (ref 0.2–1.2)
Total Protein: 6.9 g/dL (ref 6.0–8.3)

## 2019-08-25 LAB — LIPID PANEL
Cholesterol: 183 mg/dL (ref 0–200)
HDL: 64.3 mg/dL (ref >39.00)
LDL Cholesterol: 92 mg/dL (ref 0–99)
NonHDL: 118.67
Total CHOL/HDL Ratio: 3
Triglycerides: 135 mg/dL (ref 0.0–149.0)
VLDL: 27 mg/dL (ref 0.0–40.0)

## 2019-08-25 LAB — TSH: TSH: 7.71 u[IU]/mL — ABNORMAL HIGH (ref 0.35–4.50)

## 2019-08-25 NOTE — Progress Notes (Signed)
Tommi Rumps, MD Phone: [phone]  Pam Salazar Charlot is a 74 y.o. female who presents today for f/u.  Anxiety: Patient notes this is good.  She takes clonazepam with no drowsiness.  She does not drink alcohol with this.  Some depression though not daily and this is mostly related to the pandemic.  She is also on Zoloft.  Vaginal discharge: Patient has noticed the discharge this year.  There is an odor to it.  No history of this in the past.  Describes it is clear.  Its not much.  Its not fishy smelling.  No vaginal bleeding with this.  She is sexually active with one partner and does note some small spots of blood after intercourse.  She does have a history of atrophic vaginitis and saw gynecology previously for this.  Left fourth toe pain: This has been going on for the last month or so.  It is on the fibular aspect of the left fourth toe.  She feels as though her pinky toe is overriding the fourth toe and that may be causing the discomfort.  No injury.  Social History   Tobacco Use  Smoking Status Former Smoker  . Types: Cigarettes  Smokeless Tobacco Former Systems developer  . Quit date: 07/26/2013  Tobacco Comment         ROS see history of present illness  Objective  Physical Exam Vitals:   08/25/19 1108  BP: 120/70  Pulse: 87  Temp: (!) 96.2 F (35.7 C)  SpO2: 98%    BP Readings from Last 3 Encounters:  08/25/19 120/70  01/15/19 90/60  09/18/18 92/70   Wt Readings from Last 3 Encounters:  08/25/19 129 lb 6.4 oz (58.7 kg)  01/15/19 126 lb 1.9 oz (57.2 kg)  09/18/18 126 lb 6.4 oz (57.3 kg)    Physical Exam Constitutional:      General: She is not in acute distress.    Appearance: She is not diaphoretic.  Cardiovascular:     Rate and Rhythm: Normal rate and regular rhythm.     Heart sounds: Normal heart sounds.  Pulmonary:     Effort: Pulmonary effort is normal.     Breath sounds: Normal breath sounds.  Genitourinary:    Comments: Fulton Mole, CMA served as  chaperone, normal labia, mild atrophic vaginal mucosa, there is a vaginal polyp noted, cervix is absent, no discharge noted, no masses or tenderness on bimanual exam Musculoskeletal:     Comments: Left fourth toe is slightly curved to the tibial side, no apparent skin changes, no tenderness  Skin:    General: Skin is warm and dry.  Neurological:     Mental Status: She is alert.      Assessment/Plan: Please see individual problem list.  Hypothyroidism Check TSH.  Menopausal vaginal dryness Patient does have some atrophic vaginitis.  Will refer back to GYN.  Anxiety and depression Relatively stable.  She will continue her current regimen.  Vaginal discharge Checking for BV, yeast, trichomonas, chlamydia, and gonorrhea.  She declines HIV screening.  Toe pain, left Likely related to the fifth toe overriding the fourth toe.  Discussed getting Dr. Felicie Morn pads to put between the toes and seeing if that helps.  If that is not beneficial we can refer to podiatry.   Orders Placed This Encounter  Procedures  . TSH  . Comp Met (CMET)  . Lipid panel  . Ambulatory referral to Gynecology    Referral Priority:   Routine    Referral Type:  Consultation    Referral Reason:   Specialty Services Required    Requested Specialty:   Gynecology    Number of Visits Requested:   1    No orders of the defined types were placed in this encounter.   This visit occurred during the SARS-CoV-2 public health emergency.  Safety protocols were in place, including screening questions prior to the visit, additional usage of staff PPE, and extensive cleaning of exam room while observing appropriate contact time as indicated for disinfecting solutions.    Tommi Rumps, MD [place]

## 2019-08-25 NOTE — Patient Instructions (Signed)
Nice to see you. We will call you with your swab results and lab results. Please try a Dr. Felicie Morn pad between your toes to see if that helps with the discomfort.

## 2019-08-26 DIAGNOSIS — N898 Other specified noninflammatory disorders of vagina: Secondary | ICD-10-CM | POA: Insufficient documentation

## 2019-08-26 DIAGNOSIS — M79675 Pain in left toe(s): Secondary | ICD-10-CM | POA: Insufficient documentation

## 2019-08-26 LAB — CERVICOVAGINAL ANCILLARY ONLY
Bacterial Vaginitis (gardnerella): NEGATIVE
Candida Glabrata: NEGATIVE
Candida Vaginitis: NEGATIVE
Chlamydia: NEGATIVE
Comment: NEGATIVE
Comment: NEGATIVE
Comment: NEGATIVE
Comment: NEGATIVE
Comment: NEGATIVE
Comment: NORMAL
Neisseria Gonorrhea: NEGATIVE
Trichomonas: NEGATIVE

## 2019-08-26 NOTE — Assessment & Plan Note (Signed)
Relatively stable.  She will continue her current regimen.

## 2019-08-26 NOTE — Assessment & Plan Note (Signed)
Likely related to the fifth toe overriding the fourth toe.  Discussed getting Dr. Felicie Morn pads to put between the toes and seeing if that helps.  If that is not beneficial we can refer to podiatry.

## 2019-08-26 NOTE — Assessment & Plan Note (Signed)
Patient does have some atrophic vaginitis.  Will refer back to GYN.

## 2019-08-26 NOTE — Assessment & Plan Note (Signed)
Check TSH 

## 2019-08-26 NOTE — Assessment & Plan Note (Signed)
Checking for BV, yeast, trichomonas, chlamydia, and gonorrhea.  She declines HIV screening.

## 2019-08-27 ENCOUNTER — Telehealth: Payer: Self-pay | Admitting: Obstetrics & Gynecology

## 2019-08-27 NOTE — Telephone Encounter (Signed)
[facility] referring for atrophic vaginitis, vaginal polyp. Called and left voicemail for patient to call back to be schedule

## 2019-08-31 ENCOUNTER — Telehealth: Payer: Self-pay

## 2019-08-31 ENCOUNTER — Ambulatory Visit: Payer: Medicare Other | Attending: Internal Medicine

## 2019-08-31 DIAGNOSIS — Z23 Encounter for immunization: Secondary | ICD-10-CM | POA: Insufficient documentation

## 2019-08-31 DIAGNOSIS — E039 Hypothyroidism, unspecified: Secondary | ICD-10-CM

## 2019-08-31 DIAGNOSIS — R7989 Other specified abnormal findings of blood chemistry: Secondary | ICD-10-CM

## 2019-08-31 NOTE — Telephone Encounter (Signed)
-----   Message from Leone Haven, MD sent at 08/26/2019  2:58 PM EST ----- She could have her labs recheck in 2 weeks after coming off the biotin.

## 2019-08-31 NOTE — Progress Notes (Signed)
   Covid-19 Vaccination Clinic  Name:  Savita Neis    MRN: [id number] DOB: 07-03-1945  08/31/2019  Ms. Evangelista was observed post Covid-19 immunization for 15 minutes without incident. She was provided with Vaccine Information Sheet and instruction to access the V-Safe system.   Ms. Nowlen was instructed to call 911 with any severe reactions post vaccine: Marland Kitchen Difficulty breathing  . Swelling of face and throat  . A fast heartbeat  . A bad rash all over body  . Dizziness and weakness   Immunizations Administered    Name Date Dose VIS Date Route   Pfizer COVID-19 Vaccine 08/31/2019  8:56 AM 0.3 mL 06/04/2019 Intramuscular   Manufacturer: [place]   Lot: [id number]   [place]: [id number]

## 2019-08-31 NOTE — Telephone Encounter (Signed)
Called and left voice mail for patient to call back to be schedule °

## 2019-09-01 NOTE — Telephone Encounter (Signed)
Called and left voicemail for patient to call back to be schedule. Contacted PCP unable reach patient to schedule appointment

## 2019-09-06 ENCOUNTER — Telehealth: Payer: Self-pay

## 2019-09-06 NOTE — Telephone Encounter (Signed)
I called the patient  And left a message informing  her that the referral was placed and Dorice Lam[facility] has been trying to reach her to schedule a visit.  Rebbecca Osuna,cma

## 2019-09-07 ENCOUNTER — Other Ambulatory Visit: Payer: Self-pay | Admitting: Family Medicine

## 2019-09-09 ENCOUNTER — Ambulatory Visit: Payer: Medicare Other | Admitting: Obstetrics and Gynecology

## 2019-09-29 ENCOUNTER — Encounter: Payer: Self-pay | Admitting: Obstetrics and Gynecology

## 2019-09-29 ENCOUNTER — Other Ambulatory Visit: Payer: Self-pay

## 2019-09-29 ENCOUNTER — Ambulatory Visit (INDEPENDENT_AMBULATORY_CARE_PROVIDER_SITE_OTHER): Payer: Medicare Other | Admitting: Obstetrics and Gynecology

## 2019-09-29 VITALS — BP 102/62 | Ht 62.5 in | Wt 130.0 lb

## 2019-09-29 DIAGNOSIS — M7581 Other shoulder lesions, right shoulder: Secondary | ICD-10-CM

## 2019-09-29 DIAGNOSIS — N941 Unspecified dyspareunia: Secondary | ICD-10-CM

## 2019-09-29 DIAGNOSIS — N898 Other specified noninflammatory disorders of vagina: Secondary | ICD-10-CM

## 2019-09-29 DIAGNOSIS — R0789 Other chest pain: Secondary | ICD-10-CM

## 2019-09-29 MED ORDER — INTRAROSA 6.5 MG VA INST: 6.5000 mg | VAGINAL_INSERT | Freq: Every day | VAGINAL | 11 refills | Status: DC

## 2019-09-29 NOTE — Progress Notes (Signed)
Patient ID: Pam Salazar, female   DOB: 1945/10/31, 74 y.o.   MRN: [id number]  Reason for Consult: Referral (C/o vaginal odor, some discharge )   Referred by Leone Haven, MD  Subjective:     HPI:  Pam Salazar is a 74 y.o. female she is here today with complaints of the vaginal vaginal odor and discharge that has been present for about 3 months.  She denies any vaginal pain or itching.  She reports that she does have a foul vaginal odor.  She says that the discharge is sometimes yellowish sometimes clear in color.  She is sexually active once week with her husband.  She has pain with intercourse.  She used intrarosa last year for a month with good success and is happy taking this medication.  However her insurance did not cover it and she did not elect to continue the medicine.  She would like for [place] to try a prior authorization for this medication.  She notes that she has been having pain in her right breast for about a year.  She reports that she has had ultrasounds and mammograms which have been normal and her primary care physician has not been able to determine the cause.  She would like a breast exam today.  The pain extends from her breast all the way to her shoulder.   Past Medical History:  Diagnosis Date  . Arthritis    hips, lower back  . Breast lump 10/21/2011   Overview:  Negative diagnostic mammogram 09/2011   . Depression   . Fracture of rib of right side 08/17/2013  . Hypercholesterolemia   . Hypothyroidism   . Low blood pressure 10/25/2014  . Panic disorder    previous agoraphobia  . Seizures (HCC)    febrile - as infant  . Shoulder pain, left    bursa  . Vertigo    positional - rare  . Vitamin D deficiency    Family History  Problem Relation Age of Onset  . Arthritis Mother   . Hyperlipidemia Mother   . Arthritis Father   . Prostate cancer Father   . Breast cancer Sister 59  . Heart disease Brother        heart attack  . Diabetes Brother    . Breast cancer Maternal Aunt 85  . Colon cancer Maternal Uncle    Past Surgical History:  Procedure Laterality Date  . ABDOMINAL HYSTERECTOMY  1981   partial - secondary to bleeding, ovaries not removed -   . CATARACT EXTRACTION W/ INTRAOCULAR LENS  IMPLANT, BILATERAL    . COLONOSCOPY WITH PROPOFOL N/A 05/05/2015   Procedure: COLONOSCOPY WITH PROPOFOL;  Surgeon: Lucilla Lame, MD;  Location: [place];  Service: Endoscopy;  Laterality: N/A;  . MASS EXCISION Right 12/24/2017   Procedure: EXCISION MASS ( SHIN );  Surgeon: Katha Cabal, MD;  Location: [facility];  Service: Vascular;  Laterality: Right;  . ORIF FIBULA FRACTURE Right 2003   right lower extremitiy  . shoulder Left    bone suprs  . SHOULDER SURGERY  12/2015  . TONSILLECTOMY  1974    Short Social History:  Social History   Tobacco Use  . Smoking status: Former Smoker    Types: Cigarettes  . Smokeless tobacco: Former Systems developer    Quit date: 07/26/2013  . Tobacco comment:    Substance Use Topics  . Alcohol use: No    Alcohol/week: 0.0 standard drinks    Allergies  Allergen Reactions  . Duloxetine Hcl Other (See Comments)    depression worsened  . Ezetimibe-Simvastatin Other (See Comments)    Joint pain    Current Outpatient Medications  Medication Sig Dispense Refill  . clonazePAM (KLONOPIN) 1 MG tablet TAKE 1 TABLET(1 MG) BY MOUTH TWICE DAILY AS NEEDED FOR ANXIETY 60 tablet 0  . diphenhydramine-acetaminophen (TYLENOL PM) 25-500 MG TABS tablet Take 1 tablet by mouth at bedtime.     Marland Kitchen levothyroxine (SYNTHROID) 112 MCG tablet TAKE 1 TABLET(112 MCG) BY MOUTH DAILY BEFORE BREAKFAST 90 tablet 1  . Melatonin 3 MG CAPS Take 1 capsule by mouth at bedtime as needed.    . meloxicam (MOBIC) 15 MG tablet TAKE 1 TABLET(15 MG) BY MOUTH DAILY WITH FOOD AS NEEDED FOR PAIN 30 tablet 0  . Multiple Vitamins-Minerals (PRESERVISION AREDS 2 PO) Take 1 capsule by mouth 2 (two) times daily.     . sertraline (ZOLOFT) 100 MG  tablet TAKE 1 AND 1/2 TABLETS(150 MG) BY MOUTH DAILY 135 tablet 1  . simvastatin (ZOCOR) 20 MG tablet TAKE 1 TABLET BY MOUTH EVERY EVENING 90 tablet 1  . Vitamin D, Cholecalciferol, 25 MCG (1000 UT) TABS Take 1 capsule by mouth daily.    . Prasterone (INTRAROSA) 6.5 MG INST Place 6.5 mg vaginally at bedtime. 30 each 11   No current facility-administered medications for this visit.    Review of Systems  Constitutional: Negative for chills, fatigue, fever and unexpected weight change.  HENT: Negative for trouble swallowing.  Eyes: Negative for loss of vision.  Respiratory: Negative for cough, shortness of breath and wheezing.  Cardiovascular: Negative for chest pain, leg swelling, palpitations and syncope.  GI: Negative for abdominal pain, blood in stool, diarrhea, nausea and vomiting.  GU: Negative for difficulty urinating, dysuria, frequency and hematuria.  Musculoskeletal: Negative for back pain, leg pain and joint pain.  Skin: Negative for rash.  Neurological: Negative for dizziness, headaches, light-headedness, numbness and seizures.  Psychiatric: Negative for behavioral problem, confusion, depressed mood and sleep disturbance.      Objective:  Objective   Vitals:   09/29/19 1336  BP: 102/62  Weight: 130 lb (59 kg)  Height: 5' 2.5" (1.588 m)   Body mass index is 23.4 kg/m.  Physical Exam Vitals and nursing note reviewed.  Constitutional:      Appearance: She is well-developed.  HENT:     Head: Normocephalic and atraumatic.  Eyes:     Pupils: Pupils are equal, round, and reactive to light.  Cardiovascular:     Rate and Rhythm: Normal rate and regular rhythm.  Pulmonary:     Effort: Pulmonary effort is normal. No respiratory distress.  Genitourinary:    Comments: External: Normal appearing vulva. No lesions noted.  Speculum examination: Normal cuff with polyp. Patient reports polyp has been there since the 90s.  No blood in the vaginal vault. Scant discharge.    Bimanual examination: Uterus absent.  No adnexal masses. No adnexal tenderness. Pelvis not fixed.  Skin:    General: Skin is warm and dry.  Neurological:     Mental Status: She is alert and oriented to person, place, and time.  Psychiatric:        Behavior: Behavior normal.        Thought Content: Thought content normal.        Judgment: Judgment normal.        Assessment/Plan:     74 yo  1.  Vaginal discharge- Likely atrophic vaginitis-discussed options for  management with the patient.  We will send MDL swab to confirm.  Recommended once weekly douching with iodine to reduce discharge.  Recommended washing with warm water and applying Vaseline to the labia each night.  Patient has previously declined estrogen therapy related to concerns for family history of cancer.  Patient declines genetic testing for inherited cancers. 2.  Dyspareunia- Patient given samples of Intrarosa will send prior authorization. 3.  Right chest pain likely related to pectoral muscle.  Patient is tender on palpation along her whole pectoral muscle.  Recommended the patient to try physical therapy for her pain.  Patient declined referral today but would like to try home PT via YouTube.  Patient was given a handout on home exercises for pectoral tears.     More than 40 minutes were spent face to face with the patient in the room, reviewing the medical record, labs and images, and coordinating care for the patient. The plan of management was discussed in detail and counseling was provided.    Adrian Prows MD [facility] 09/29/2019 2:21 PM

## 2019-09-29 NOTE — Patient Instructions (Signed)
Pectoralis Major Tear Rehab Ask your health care provider which exercises are safe for you. Do exercises exactly as told by your health care provider and adjust them as directed. It is normal to feel mild stretching, pulling, tightness, or discomfort as you do these exercises. Stop right away if you feel sudden pain or your pain gets worse. Do not begin these exercises until told by your health care provider. Stretching and range-of-motion exercises These exercises warm up your muscles and joints and improve the movement and flexibility of your shoulder. These exercises can also help to relieve pain, numbness, and tingling. Pendulum This is a shoulder exercise in which you let the injured arm dangle toward the floor and then swing it like a clock pendulum. 1. Stand near a wall or a surface that you can hold onto for balance. 2. Bend at the waist and let your left / right arm hang straight down. Use your other arm to keep your balance. 3. Relax your arm and shoulder muscles, and move your hips and your trunk so your left / right arm swings freely. Your arm should swing because of the motion of your body, not because you are using your arm or shoulder muscles. 4. Keep moving your hips and trunk so your arm swings in the following directions, as told by your health care provider: ? Side to side. ? Forward and backward. ? In clockwise and counterclockwise circles. 5. Slowly return to the starting position. Repeat __________ times. Complete this exercise __________ times a day. Standing shoulder abduction, passive In this exercise, the injured shoulder relaxes (passive) while you use the healthy arm to push it away from your body (abduction). 1. Stand and hold a broomstick, a cane, or a similar object. Place your hands a little more than shoulder width apart on the object. Your left / right hand should be palm-up, and your other hand should be palm-down. 2. While keeping your elbow straight and your  shoulder muscles relaxed, push the stick across your body toward your left / right side. Raise your left / right arm to the side of your body and then over your head until you feel a stretch in your shoulder. ? Stop when you reach the angle that is recommended by your health care provider. ? Avoid shrugging your shoulder while you raise your arm. Keep your shoulder blade tucked down toward the middle of your spine. 3. Hold for __________ seconds. 4. Slowly return to the starting position. Repeat __________ times. Complete this exercise __________ times a day. Supine wand shoulder flexion, passive In this exercise, the injured shoulder relaxes (passive) while you use the healthy arm to move it (flexion). 1. Lie on your back (supine position). You may bend your knees for comfort. 2. Hold a broomstick, a cane, or a similar object so that your hands are about shoulder width apart on the object. Your palms should face toward your feet. 3. Raise your left / right arm in front of your face, then behind your head (toward the floor). Use your other hand to help you do this. Stop when you feel a gentle stretch in your shoulder, or when you reach the angle that is recommended by your health care provider. 4. Hold for __________ seconds. 5. Use the stick and your other arm to help you return your left / right arm to the starting position. Repeat __________ times. Complete this exercise __________ times a day. Wand shoulder external rotation, passive In this exercise, the injured  shoulder relaxes (passive) while you use the healthy arm to push it away to your side (external rotation). 1. Stand and hold a broomstick, a cane, or a similar object so your hands are about shoulder width apart on the object. 2. Start with your arms hanging down, then bend both elbows to a 90-degree angle (right angle). 3. Keep your left / right elbow at your side. Use your other hand to push the stick so your left / right forearm  moves away from your body, out to your side. ? Keep your left / right elbow bent to 90 degrees and keep it against your side. ? Stop when you feel a gentle stretch in your shoulder, or when you reach the angle recommended by your health care provider. 4. Hold for __________ seconds. 5. Use the stick to help you return your left / right arm to the starting position. Repeat __________ times. Complete this exercise __________ times a day. Strengthening exercises These exercises build strength and endurance in your shoulder. Endurance is the ability to use your muscles for a [place]tti time, even after your muscles get tired. Scapular protraction, standing  1. Stand so you are facing a wall. Place your feet about one arm-length away from the wall. 2. Place your hands on the wall and straighten your elbows. 3. Keep your hands on the wall as you push your upper back away from the wall. You should feel your shoulder blades (scapulae) sliding forward (protraction) around your rib cage. Keep your elbows and your head still. ? If you are not sure that you are doing this exercise correctly, ask your health care provider for more instructions. 4. Hold for __________ seconds. 5. Slowly return to the starting position. Let your muscles relax completely before you repeat this exercise. Repeat __________ times. Complete this exercise __________ times a day. Scapular retraction, seated This exercise is also called shoulder blade squeezes. 1. Sit with good posture in a stable chair without armrests. Do not let your back touch the back of the chair. 2. Your arms should be at your sides with your elbows bent. You may rest your forearms on a pillow if that is more comfortable. 3. Squeeze your shoulder blades (scapulae) together. Bring them down and back (retraction). ? Keep your shoulders level. ? Do not lift your shoulders up toward your ears. 4. Hold for __________ seconds. 5. Return to the starting  position. Repeat __________ times. Complete this exercise __________ times a day. This information is not intended to replace advice given to you by your health care provider. Make sure you discuss any questions you have with your health care provider. Document Revised: 10/01/2018 Document Reviewed: 06/08/2018 Elsevier Patient Education  [place].

## 2019-10-04 ENCOUNTER — Other Ambulatory Visit: Payer: Self-pay | Admitting: Family Medicine

## 2019-10-04 NOTE — Telephone Encounter (Signed)
Refill request for klonopin, last seen 08-25-19, last filled 09-08-19.  Please advise.

## 2019-10-11 ENCOUNTER — Other Ambulatory Visit: Payer: Self-pay

## 2019-10-11 ENCOUNTER — Other Ambulatory Visit (INDEPENDENT_AMBULATORY_CARE_PROVIDER_SITE_OTHER): Payer: Medicare Other

## 2019-10-11 DIAGNOSIS — E039 Hypothyroidism, unspecified: Secondary | ICD-10-CM

## 2019-10-11 LAB — TSH: TSH: 9.19 u[IU]/mL — ABNORMAL HIGH (ref 0.35–4.50)

## 2019-10-12 ENCOUNTER — Telehealth: Payer: Self-pay | Admitting: Obstetrics and Gynecology

## 2019-10-12 NOTE — Telephone Encounter (Signed)
Patient is calling for labs results. Please advise. Patient says it's ok to leave detailed message

## 2019-10-13 ENCOUNTER — Telehealth: Payer: Self-pay

## 2019-10-13 ENCOUNTER — Encounter: Payer: Self-pay | Admitting: Family Medicine

## 2019-10-13 DIAGNOSIS — E039 Hypothyroidism, unspecified: Secondary | ICD-10-CM

## 2019-10-13 MED ORDER — LEVOTHYROXINE SODIUM 125 MCG PO TABS: ORAL_TABLET | ORAL | 1 refills | Status: DC

## 2019-10-13 NOTE — Telephone Encounter (Signed)
-----   Message from Leone Haven, MD sent at 10/13/2019  2:48 PM EDT ----- Please let the patient know her TSH is elevated. I would like to increase her synthroid dose to 125 mcg by mouth once daily. This can be sent in with 90 tablets and 1 refill. She needs a repeat TSH in 6 weeks for a diagnosis of hypothyroidism. Please place this as a future order.

## 2019-10-18 NOTE — Telephone Encounter (Signed)
Patient is calling to follow up on her results. Please advise

## 2019-11-08 ENCOUNTER — Telehealth (INDEPENDENT_AMBULATORY_CARE_PROVIDER_SITE_OTHER): Payer: Medicare Other | Admitting: Family Medicine

## 2019-11-08 DIAGNOSIS — F329 Major depressive disorder, single episode, unspecified: Secondary | ICD-10-CM | POA: Diagnosis not present

## 2019-11-08 DIAGNOSIS — F419 Anxiety disorder, unspecified: Secondary | ICD-10-CM | POA: Diagnosis not present

## 2019-11-08 MED ORDER — SERTRALINE HCL 100 MG PO TABS: 200.0000 mg | ORAL_TABLET | Freq: Every day | ORAL | 1 refills | Status: DC

## 2019-11-08 NOTE — Assessment & Plan Note (Signed)
Depression is worsened.  Offered support.  She was given the name of a local counselor and the phone number to call to schedule therapy if she would like to do that.  We will increase her Zoloft.  I will research supplements and let her know what I think about them though I did advise that generally I do not recommend supplements over-the-counter given that they are not regulated.  Advised to seek medical attention in the emergency room if she developed suicidal thoughts.  Follow-up in 3 to 4 weeks.

## 2019-11-08 NOTE — Progress Notes (Signed)
Virtual Visit via video Note  This visit type was conducted due to national recommendations for restrictions regarding the COVID-19 pandemic (e.g. social distancing).  This format is felt to be most appropriate for this patient at this time.  All issues noted in this document were discussed and addressed.  No physical exam was performed (except for noted visual exam findings with Video Visits).   I connected with Pam Salazar today at 11:30 AM EDT by a video enabled telemedicine application and verified that I am speaking with the correct person using two identifiers. Location patient: home Location provider: work Persons participating in the virtual visit: patient, provider  I discussed the limitations, risks, security and privacy concerns of performing an evaluation and management service by telephone and the availability of in person appointments. I also discussed with the patient that there may be a patient responsible charge related to this service. The patient expressed understanding and agreed to proceed.  Reason for visit: f/u.  HPI: Depression: Patient notes this is gotten worse over the last couple of weeks.  She notes it is a response to her granddaughter being in a treatment center for depression and bulimia.  She notes her granddaughter is thinking about leaving AMA and the patient told her today that she would not pick her up if she left AMA.  The patient notes its an effort for her to do anything.  1 minute she will be crying and the next minute she is agitated.  She notes she is not herself.  She is forgetting more things.  No SI.  She wonders about taking 5 HTP or L-theanine.  She notes in the past she has not been helped by therapy though she had a bad experience with a therapist in [place].   ROS: See pertinent positives and negatives per HPI.  Past Medical History:  Diagnosis Date  . Arthritis    hips, lower back  . Breast lump 10/21/2011   Overview:  Negative  diagnostic mammogram 09/2011   . Depression   . Fracture of rib of right side 08/17/2013  . Hypercholesterolemia   . Hypothyroidism   . Low blood pressure 10/25/2014  . Panic disorder    previous agoraphobia  . Seizures (HCC)    febrile - as infant  . Shoulder pain, left    bursa  . Vertigo    positional - rare  . Vitamin D deficiency     Past Surgical History:  Procedure Laterality Date  . ABDOMINAL HYSTERECTOMY  1981   partial - secondary to bleeding, ovaries not removed -   . CATARACT EXTRACTION W/ INTRAOCULAR LENS  IMPLANT, BILATERAL    . COLONOSCOPY WITH PROPOFOL N/A 05/05/2015   Procedure: COLONOSCOPY WITH PROPOFOL;  Surgeon: Lucilla Lame, MD;  Location: [place];  Service: Endoscopy;  Laterality: N/A;  . MASS EXCISION Right 12/24/2017   Procedure: EXCISION MASS ( SHIN );  Surgeon: Katha Cabal, MD;  Location: [facility];  Service: Vascular;  Laterality: Right;  . ORIF FIBULA FRACTURE Right 2003   right lower extremitiy  . shoulder Left    bone suprs  . SHOULDER SURGERY  12/2015  . TONSILLECTOMY  1974    Family History  Problem Relation Age of Onset  . Arthritis Mother   . Hyperlipidemia Mother   . Arthritis Father   . Prostate cancer Father   . Breast cancer Sister 30  . Heart disease Brother        heart attack  .  Diabetes Brother   . Breast cancer Maternal Aunt 85  . Colon cancer Maternal Uncle     SOCIAL HX: Former smoker   Current Outpatient Medications:  .  clonazePAM (KLONOPIN) 1 MG tablet, TAKE 1 TABLET(1 MG) BY MOUTH TWICE DAILY AS NEEDED FOR ANXIETY, Disp: 60 tablet, Rfl: 1 .  diphenhydramine-acetaminophen (TYLENOL PM) 25-500 MG TABS tablet, Take 1 tablet by mouth at bedtime. , Disp: , Rfl:  .  levothyroxine (SYNTHROID) 125 MCG tablet, TAKE 1 TABLET(125 MCG) BY MOUTH DAILY BEFORE BREAKFAST, Disp: 90 tablet, Rfl: 1 .  Melatonin 3 MG CAPS, Take 1 capsule by mouth at bedtime as needed., Disp: , Rfl:  .  meloxicam (MOBIC) 15 MG tablet, TAKE  1 TABLET(15 MG) BY MOUTH DAILY WITH FOOD AS NEEDED FOR PAIN, Disp: 30 tablet, Rfl: 0 .  Multiple Vitamins-Minerals (PRESERVISION AREDS 2 PO), Take 1 capsule by mouth 2 (two) times daily. , Disp: , Rfl:  .  sertraline (ZOLOFT) 100 MG tablet, Take 2 tablets (200 mg total) by mouth daily., Disp: 180 tablet, Rfl: 1 .  simvastatin (ZOCOR) 20 MG tablet, TAKE 1 TABLET BY MOUTH EVERY EVENING, Disp: 90 tablet, Rfl: 1  EXAM:  VITALS per patient if applicable:  GENERAL: alert, oriented, appears well and in no acute distress  HEENT: atraumatic, conjunttiva clear, no obvious abnormalities on inspection of external nose and ears  NECK: normal movements of the head and neck  LUNGS: on inspection no signs of respiratory distress, breathing rate appears normal, no obvious gross SOB, gasping or wheezing  CV: no obvious cyanosis  MS: moves all visible extremities without noticeable abnormality  PSYCH/NEURO: pleasant and cooperative, affect flat, speech and thought processing grossly intact  ASSESSMENT AND PLAN:  Discussed the following assessment and plan:  Anxiety and depression Depression is worsened.  Offered support.  She was given the name of a local counselor and the phone number to call to schedule therapy if she would like to do that.  We will increase her Zoloft.  I will research supplements and let her know what I think about them though I did advise that generally I do not recommend supplements over-the-counter given that they are not regulated.  Advised to seek medical attention in the emergency room if she developed suicidal thoughts.  Follow-up in 3 to 4 weeks.   No orders of the defined types were placed in this encounter.   Meds ordered this encounter  Medications  . sertraline (ZOLOFT) 100 MG tablet    Sig: Take 2 tablets (200 mg total) by mouth daily.    Dispense:  180 tablet    Refill:  1     I discussed the assessment and treatment plan with the patient. The patient was  provided an opportunity to ask questions and all were answered. The patient agreed with the plan and demonstrated an understanding of the instructions.   The patient was advised to call back or seek an in-person evaluation if the symptoms worsen or if the condition fails to improve as anticipated.    Tommi Rumps, MD

## 2019-11-25 ENCOUNTER — Other Ambulatory Visit: Payer: Self-pay

## 2019-11-25 ENCOUNTER — Other Ambulatory Visit (INDEPENDENT_AMBULATORY_CARE_PROVIDER_SITE_OTHER): Payer: Medicare Other

## 2019-11-25 DIAGNOSIS — E039 Hypothyroidism, unspecified: Secondary | ICD-10-CM

## 2019-11-25 LAB — TSH: TSH: 2.89 u[IU]/mL (ref 0.35–4.50)

## 2019-11-26 ENCOUNTER — Encounter: Payer: Self-pay | Admitting: Family Medicine

## 2019-11-26 ENCOUNTER — Telehealth (INDEPENDENT_AMBULATORY_CARE_PROVIDER_SITE_OTHER): Payer: Medicare Other | Admitting: Family Medicine

## 2019-11-26 DIAGNOSIS — F419 Anxiety disorder, unspecified: Secondary | ICD-10-CM

## 2019-11-26 DIAGNOSIS — F329 Major depressive disorder, single episode, unspecified: Secondary | ICD-10-CM | POA: Diagnosis not present

## 2019-11-26 DIAGNOSIS — F32A Depression, unspecified: Secondary | ICD-10-CM

## 2019-11-26 DIAGNOSIS — E039 Hypothyroidism, unspecified: Secondary | ICD-10-CM | POA: Diagnosis not present

## 2019-11-26 NOTE — Assessment & Plan Note (Addendum)
Chronic issue. Continue Synthroid. Plan to recheck TSH in 3 to 6 months.

## 2019-11-26 NOTE — Assessment & Plan Note (Addendum)
Chronic issue. Improving. Discussed that she would likely notice further improvement over the next 4 weeks or so. Discussed adding in some exercises that may be beneficial for her mental health as well. She'll continue Zoloft and clonazepam. Follow-up in 3 months.

## 2019-11-26 NOTE — Progress Notes (Signed)
Virtual Visit via video Note  This visit type was conducted due to national recommendations for restrictions regarding the COVID-19 pandemic (e.g. social distancing).  This format is felt to be most appropriate for this patient at this time.  All issues noted in this document were discussed and addressed.  No physical exam was performed (except for noted visual exam findings with Video Visits).   I connected with Pam Salazar today at 10:30 AM EDT by a video enabled telemedicine application and verified that I am speaking with the correct person using two identifiers. Location patient: home Location provider: work Persons participating in the virtual visit: patient, provider  I discussed the limitations, risks, security and privacy concerns of performing an evaluation and management service by telephone and the availability of in person appointments. I also discussed with the patient that there may be a patient responsible charge related to this service. The patient expressed understanding and agreed to proceed.  Reason for visit: follow-up  HPI: Anxiety/depression: Patient notes the depression is better. Anxiety is rare. She notes its not as hard to get out of bed. The situation with her granddaughter hasn't changed though the patient has learned to accept that there is not much she'll be able to do. Patient denies SI. Notes she has not been exercising much.  Hypothyroidism: TSH recently returned in the normal range. She is no longer on biotin. She continues on Synthroid.   ROS: See pertinent positives and negatives per HPI.  Past Medical History:  Diagnosis Date  . Arthritis    hips, lower back  . Breast lump 10/21/2011   Overview:  Negative diagnostic mammogram 09/2011   . Depression   . Fracture of rib of right side 08/17/2013  . Hypercholesterolemia   . Hypothyroidism   . Low blood pressure 10/25/2014  . Panic disorder    previous agoraphobia  . Seizures (HCC)    febrile - as  infant  . Shoulder pain, left    bursa  . Vertigo    positional - rare  . Vitamin D deficiency     Past Surgical History:  Procedure Laterality Date  . ABDOMINAL HYSTERECTOMY  1981   partial - secondary to bleeding, ovaries not removed -   . CATARACT EXTRACTION W/ INTRAOCULAR LENS  IMPLANT, BILATERAL    . COLONOSCOPY WITH PROPOFOL N/A 05/05/2015   Procedure: COLONOSCOPY WITH PROPOFOL;  Surgeon: Lucilla Lame, MD;  Location: [place];  Service: Endoscopy;  Laterality: N/A;  . MASS EXCISION Right 12/24/2017   Procedure: EXCISION MASS ( SHIN );  Surgeon: Katha Cabal, MD;  Location: [facility];  Service: Vascular;  Laterality: Right;  . ORIF FIBULA FRACTURE Right 2003   right lower extremitiy  . shoulder Left    bone suprs  . SHOULDER SURGERY  12/2015  . TONSILLECTOMY  1974    Family History  Problem Relation Age of Onset  . Arthritis Mother   . Hyperlipidemia Mother   . Arthritis Father   . Prostate cancer Father   . Breast cancer Sister 93  . Heart disease Brother        heart attack  . Diabetes Brother   . Breast cancer Maternal Aunt 85  . Colon cancer Maternal Uncle     SOCIAL HX: Former smoker   Current Outpatient Medications:  .  clonazePAM (KLONOPIN) 1 MG tablet, TAKE 1 TABLET(1 MG) BY MOUTH TWICE DAILY AS NEEDED FOR ANXIETY, Disp: 60 tablet, Rfl: 1 .  diphenhydramine-acetaminophen (TYLENOL PM)  25-500 MG TABS tablet, Take 1 tablet by mouth at bedtime. , Disp: , Rfl:  .  levothyroxine (SYNTHROID) 125 MCG tablet, TAKE 1 TABLET(125 MCG) BY MOUTH DAILY BEFORE BREAKFAST, Disp: 90 tablet, Rfl: 1 .  Melatonin 3 MG CAPS, Take 1 capsule by mouth at bedtime as needed., Disp: , Rfl:  .  meloxicam (MOBIC) 15 MG tablet, TAKE 1 TABLET(15 MG) BY MOUTH DAILY WITH FOOD AS NEEDED FOR PAIN, Disp: 30 tablet, Rfl: 0 .  Multiple Vitamins-Minerals (PRESERVISION AREDS 2 PO), Take 1 capsule by mouth 2 (two) times daily. , Disp: , Rfl:  .  sertraline (ZOLOFT) 100 MG tablet, Take  2 tablets (200 mg total) by mouth daily., Disp: 180 tablet, Rfl: 1 .  simvastatin (ZOCOR) 20 MG tablet, TAKE 1 TABLET BY MOUTH EVERY EVENING, Disp: 90 tablet, Rfl: 1  EXAM:  VITALS per patient if applicable:  GENERAL: alert, oriented, appears well and in no acute distress  HEENT: atraumatic, conjunttiva clear, no obvious abnormalities on inspection of external nose and ears  NECK: normal movements of the head and neck  LUNGS: on inspection no signs of respiratory distress, breathing rate appears normal, no obvious gross SOB, gasping or wheezing  CV: no obvious cyanosis  MS: moves all visible extremities without noticeable abnormality  PSYCH/NEURO: pleasant and cooperative, no obvious depression or anxiety, speech and thought processing grossly intact  ASSESSMENT AND PLAN:  Discussed the following assessment and plan:  Anxiety and depression Improving. Discussed that she would likely notice further improvement over the next 4 weeks or so. Discussed adding in some exercises that may be beneficial for her mental health as well. She'll continue Zoloft and clonazepam. Follow-up in 3 months.  Hypothyroidism Continue Synthroid. Plan to recheck TSH in 3 to 6 months.   No orders of the defined types were placed in this encounter.   No orders of the defined types were placed in this encounter.    I discussed the assessment and treatment plan with the patient. The patient was provided an opportunity to ask questions and all were answered. The patient agreed with the plan and demonstrated an understanding of the instructions.   The patient was advised to call back or seek an in-person evaluation if the symptoms worsen or if the condition fails to improve as anticipated.    Tommi Rumps, MD

## 2019-11-30 ENCOUNTER — Telehealth: Payer: Self-pay | Admitting: Family Medicine

## 2019-11-30 ENCOUNTER — Other Ambulatory Visit: Payer: Self-pay | Admitting: Family Medicine

## 2019-11-30 DIAGNOSIS — Z1231 Encounter for screening mammogram for malignant neoplasm of breast: Secondary | ICD-10-CM

## 2019-11-30 NOTE — Telephone Encounter (Signed)
Pt scheduled appt 03/06/20 @ 2:15pm.

## 2019-11-30 NOTE — Telephone Encounter (Signed)
LVM for pt to schedule 16m follow up for anxiety with PCP

## 2019-12-06 ENCOUNTER — Other Ambulatory Visit: Payer: Self-pay | Admitting: Family Medicine

## 2019-12-06 NOTE — Telephone Encounter (Signed)
Refill request for  klonopin, last seen 11-26-19, last filled 10-04-19.  Please advise.

## 2020-01-07 ENCOUNTER — Encounter: Payer: Self-pay | Admitting: Family Medicine

## 2020-01-12 DIAGNOSIS — N951 Menopausal and female climacteric states: Secondary | ICD-10-CM | POA: Diagnosis not present

## 2020-01-12 DIAGNOSIS — E038 Other specified hypothyroidism: Secondary | ICD-10-CM | POA: Diagnosis not present

## 2020-01-17 DIAGNOSIS — E038 Other specified hypothyroidism: Secondary | ICD-10-CM | POA: Diagnosis not present

## 2020-01-17 DIAGNOSIS — F5101 Primary insomnia: Secondary | ICD-10-CM | POA: Diagnosis not present

## 2020-01-17 DIAGNOSIS — R5382 Chronic fatigue, unspecified: Secondary | ICD-10-CM | POA: Diagnosis not present

## 2020-01-17 DIAGNOSIS — R768 Other specified abnormal immunological findings in serum: Secondary | ICD-10-CM | POA: Diagnosis not present

## 2020-01-17 DIAGNOSIS — E063 Autoimmune thyroiditis: Secondary | ICD-10-CM | POA: Diagnosis not present

## 2020-01-17 DIAGNOSIS — N898 Other specified noninflammatory disorders of vagina: Secondary | ICD-10-CM | POA: Diagnosis not present

## 2020-01-17 DIAGNOSIS — N958 Other specified menopausal and perimenopausal disorders: Secondary | ICD-10-CM | POA: Diagnosis not present

## 2020-01-17 DIAGNOSIS — M2559 Pain in other specified joint: Secondary | ICD-10-CM | POA: Diagnosis not present

## 2020-01-17 DIAGNOSIS — R6882 Decreased libido: Secondary | ICD-10-CM | POA: Diagnosis not present

## 2020-01-18 ENCOUNTER — Ambulatory Visit
Admission: RE | Admit: 2020-01-18 | Discharge: 2020-01-18 | Disposition: A | Discharge status: Home / Self Care | Payer: Medicare Other | Source: Ambulatory Visit | Attending: Family Medicine | Admitting: Family Medicine

## 2020-01-18 ENCOUNTER — Ambulatory Visit: Payer: Medicare Other

## 2020-01-18 DIAGNOSIS — Z1231 Encounter for screening mammogram for malignant neoplasm of breast: Secondary | ICD-10-CM | POA: Diagnosis not present

## 2020-02-01 ENCOUNTER — Ambulatory Visit (INDEPENDENT_AMBULATORY_CARE_PROVIDER_SITE_OTHER): Payer: Medicare Other

## 2020-02-01 VITALS — Ht 62.5 in | Wt 122.0 lb

## 2020-02-01 DIAGNOSIS — Z0000 Encounter for general adult medical examination without abnormal findings: Secondary | ICD-10-CM | POA: Diagnosis not present

## 2020-02-01 NOTE — Progress Notes (Signed)
Subjective:   Pam Salazar is a 74 y.o. female who presents for Medicare Annual (Subsequent) preventive examination.  Review of Systems    No ROS.  Medicare Wellness Virtual Visit.   Cardiac Risk Factors include: advanced age (>29men, >68 women)     Objective:    Today's Vitals   02/01/20 1237  Weight: 122 lb (55.3 kg)  Height: 5' 2.5" (1.588 m)   Body mass index is 21.96 kg/m.  Advanced Directives 02/01/2020 01/15/2019 01/13/2018 12/22/2017 04/10/2017 07/11/2015  Does Patient Have a Medical Advance Directive? Yes Yes Yes Yes No Yes  Type of Paramedic of [place];Living will [place];Living will Healthcare Power of [place];Living will  Does patient want to make changes to medical advance directive? No - Patient declined No - Patient declined No - Patient declined - - No - Patient declined  Copy of [place] in Chart? No - copy requested No - copy requested No - copy requested - - No - copy requested    Current Medications (verified) Outpatient Encounter Medications as of 02/01/2020  Medication Sig  . clonazePAM (KLONOPIN) 1 MG tablet TAKE 1 TABLET(1 MG) BY MOUTH TWICE DAILY AS NEEDED FOR ANXIETY  . diphenhydramine-acetaminophen (TYLENOL PM) 25-500 MG TABS tablet Take 1 tablet by mouth at bedtime.   Marland Kitchen levothyroxine (SYNTHROID) 125 MCG tablet TAKE 1 TABLET(125 MCG) BY MOUTH DAILY BEFORE BREAKFAST  . Melatonin 3 MG CAPS Take 1 capsule by mouth at bedtime as needed.  . meloxicam (MOBIC) 15 MG tablet TAKE 1 TABLET(15 MG) BY MOUTH DAILY WITH FOOD AS NEEDED FOR PAIN  . Multiple Vitamins-Minerals (PRESERVISION AREDS 2 PO) Take 1 capsule by mouth 2 (two) times daily.   . sertraline (ZOLOFT) 100 MG tablet Take 2 tablets (200 mg total) by mouth daily.  . simvastatin (ZOCOR) 20 MG tablet TAKE 1 TABLET BY MOUTH EVERY EVENING   No facility-administered encounter  medications on file as of 02/01/2020.    Allergies (verified) Duloxetine hcl and Ezetimibe-simvastatin   History: Past Medical History:  Diagnosis Date  . Arthritis    hips, lower back  . Breast lump 10/21/2011   Overview:  Negative diagnostic mammogram 09/2011   . Depression   . Fracture of rib of right side 08/17/2013  . Hypercholesterolemia   . Hypothyroidism   . Low blood pressure 10/25/2014  . Panic disorder    previous agoraphobia  . Seizures (HCC)    febrile - as infant  . Shoulder pain, left    bursa  . Vertigo    positional - rare  . Vitamin D deficiency    Past Surgical History:  Procedure Laterality Date  . ABDOMINAL HYSTERECTOMY  1981   partial - secondary to bleeding, ovaries not removed -   . CATARACT EXTRACTION W/ INTRAOCULAR LENS  IMPLANT, BILATERAL    . COLONOSCOPY WITH PROPOFOL N/A 05/05/2015   Procedure: COLONOSCOPY WITH PROPOFOL;  Surgeon: Lucilla Lame, MD;  Location: [place];  Service: Endoscopy;  Laterality: N/A;  . MASS EXCISION Right 12/24/2017   Procedure: EXCISION MASS ( SHIN );  Surgeon: Katha Cabal, MD;  Location: [facility];  Service: Vascular;  Laterality: Right;  . ORIF FIBULA FRACTURE Right 2003   right lower extremitiy  . shoulder Left    bone suprs  . SHOULDER SURGERY  12/2015  . TONSILLECTOMY  1974   Family History  Problem Relation Age of  Onset  . Arthritis Mother   . Hyperlipidemia Mother   . Arthritis Father   . Prostate cancer Father   . Breast cancer Sister 33  . Heart disease Brother        heart attack  . Diabetes Brother   . Breast cancer Maternal Aunt 85  . Colon cancer Maternal Uncle    Social History   Socioeconomic History  . Marital status: Married    Spouse name: Not on file  . Number of children: Not on file  . Years of education: Not on file  . Highest education level: Not on file  Occupational History  . Not on file  Tobacco Use  . Smoking status: Former Smoker    Types: Cigarettes  .  Smokeless tobacco: Former Systems developer    Quit date: 07/26/2013  . Tobacco comment:    Vaping Use  . Vaping Use: Never used  Substance and Sexual Activity  . Alcohol use: No    Alcohol/week: 0.0 standard drinks  . Drug use: No  . Sexual activity: Yes    Birth control/protection: Post-menopausal  Other Topics Concern  . Not on file  Social History Narrative  . Not on file   Social Determinants of Health   Financial Resource Strain: Low Risk   . Difficulty of Paying Living Expenses: Not hard at all  Food Insecurity: No Food Insecurity  . Worried About Charity fundraiser in the Last Year: Never true  . Ran Out of Food in the Last Year: Never true  Transportation Needs: No Transportation Needs  . Lack of Transportation (Medical): No  . Lack of Transportation (Non-Medical): No  Physical Activity:   . Days of Exercise per Week:   . Minutes of Exercise per Session:   Stress: No Stress Concern Present  . Feeling of Stress : Not at all  Social Connections: Socially Integrated  . Frequency of Communication with Friends and Family: More than three times a week  . Frequency of Social Gatherings with Friends and Family: More than three times a week  . Attends Religious Services: More than 4 times per year  . Active Member of Clubs or Organizations: Yes  . Attends Archivist Meetings: More than 4 times per year  . Marital Status: Married    Tobacco Counseling Counseling given: Not Answered Comment:     Clinical Intake:  Pre-visit preparation completed: Yes        Diabetes: No  How often do you need to have someone help you when you read instructions, pamphlets, or other written materials from your doctor or pharmacy?: 1 - Never   Interpreter Needed?: No      Activities of Daily Living In your present state of health, do you have any difficulty performing the following activities: 02/01/2020  Hearing? N  Vision? N  Difficulty concentrating or making decisions? N    Walking or climbing stairs? N  Dressing or bathing? N  Doing errands, shopping? N  Preparing Food and eating ? N  Using the Toilet? N  In the past six months, have you accidently leaked urine? N  Do you have problems with loss of bowel control? N  Managing your Medications? N  Managing your Finances? N  Housekeeping or managing your Housekeeping? N  Some recent data might be hidden    Patient Care Team: Leone Haven, MD as PCP - General (Family Medicine) Christene Lye, MD as Consulting Physician (General Surgery) Einar Pheasant, MD (Internal  Medicine)  Indicate any recent Medical Services you may have received from other than [facility] providers in the past year (date may be approximate).     Assessment:   This is a routine wellness examination for Tenishia.  I connected with Areana today by telephone and verified that I am speaking with the correct person using two identifiers. Location patient: home Location provider: work Persons participating in the virtual visit: patient, Marine scientist.    I discussed the limitations, risks, security and privacy concerns of performing an evaluation and management service by telephone and the availability of in person appointments. The patient expressed understanding and verbally consented to this telephonic visit.    Interactive audio and video telecommunications were attempted between this provider and patient, however failed, due to patient having technical difficulties OR patient did not have access to video capability.  We continued and completed visit with audio only.  Some vital signs may be absent or patient reported.   Hearing/Vision screen  Hearing Screening   125Hz  250Hz  500Hz  1000Hz  2000Hz  3000Hz  4000Hz  6000Hz  8000Hz   Right ear:           Left ear:           Comments: Patient is able to hear conversational tones without difficulty.  No issues reported.  Vision Screening Comments: Wears corrective lenses Visual acuity not  assessed, virtual visit.  They have seen their ophthalmologist in the last 12 months.    Dietary issues and exercise activities discussed: Current Exercise Habits: Home exercise routine, Type of exercise: walking, Intensity: Mild  She tries to have a healthy diet Good water intake  Goals      Patient Stated   .  Eat more fruits and vegetables (pt-stated)    .  Increase physical activity (pt-stated)      Walk more for exercise       Depression Screen PHQ 2/9 Scores 02/01/2020 11/08/2019 08/25/2019 01/15/2019 04/22/2018 01/13/2018 04/30/2016  PHQ - 2 Score 0 3 0 1 4 0 0  PHQ- 9 Score - 15 - - 13 - -    Fall Risk Fall Risk  02/01/2020 11/08/2019 08/25/2019 05/17/2019 01/15/2019  Falls in the past year? 0 0 0 0 0  Comment - - - Emmi Telephone Survey: data to providers prior to load -  Number falls in past yr: - - 0 - -  Injury with Fall? - - - - -  Risk for fall due to : - - - - -  Follow up - Falls evaluation completed Falls evaluation completed - -   Handrails in use when climbing stairs? Yes  Home free of loose throw rugs in walkways, pet beds, electrical cords, etc? Yes  Adequate lighting in your home to reduce risk of falls? Yes   ASSISTIVE DEVICES UTILIZED TO PREVENT FALLS:  Life alert? No  Use of a cane, walker or w/c? No   TIMED UP AND GO:  Was the test performed? No . Virtual visit.   Cognitive Function: MMSE - Mini Mental State Exam 01/13/2018 07/11/2015  Orientation to time 5 5  Orientation to Place 5 5  Registration 3 3  Attention/ Calculation 5 5  Recall 3 3  Language- name 2 objects 2 2  Language- repeat 1 1  Language- follow 3 step command 3 3  Language- read & follow direction 1 1  Write a sentence 1 1  Copy design 1 1  Total score 30 30     6CIT Screen 01/15/2019  What Year? 0 points  What month? 0 points  What time? 0 points  Count back from 20 0 points  Months in reverse 0 points    Immunizations Immunization History  Administered Date(s)  Administered  . Influenza Split 06/28/2013, 05/27/2014, 04/21/2017  . Influenza, High Dose Seasonal PF 05/19/2018, 02/25/2019  . Influenza-Unspecified 05/25/2015, 05/24/2017  . PFIZER SARS-COV-2 Vaccination 08/06/2019, 08/31/2019  . Pneumococcal Conjugate-13 08/03/2013  . Pneumococcal Polysaccharide-23 09/14/2012, 07/11/2015  . Tdap 02/25/2019  . Zoster 06/24/2008    Health Maintenance Health Maintenance  Topic Date Due  . INFLUENZA VACCINE  01/23/2020  . MAMMOGRAM  01/17/2021  . COLONOSCOPY  05/04/2025  . TETANUS/TDAP  02/24/2029  . DEXA SCAN  Completed  . COVID-19 Vaccine  Completed  . Hepatitis C Screening  Completed  . PNA vac Low Risk Adult  Completed    Dental Screening: Recommended annual dental exams for proper oral hygiene. Visits every 6 months.   Community Resource Referral / Chronic Care Management: CRR required this visit?  No   CCM required this visit?  No      Plan:   Keep all routine maintenance appointments.   Follow up 03/06/20 @ 2:15  I have personally reviewed and noted the following in the patient's chart:   . Medical and social history . Use of alcohol, tobacco or illicit drugs  . Current medications and supplements . Functional ability and status . Nutritional status . Physical activity . Advanced directives . List of other physicians . Hospitalizations, surgeries, and ER visits in previous 12 months . Vitals . Screenings to include cognitive, depression, and falls . Referrals and appointments  In addition, I have reviewed and discussed with patient certain preventive protocols, quality metrics, and best practice recommendations. A written personalized care plan for preventive services as well as general preventive health recommendations were provided to patient via mychart.     Varney Biles, LPN   [id number]

## 2020-02-01 NOTE — Patient Instructions (Addendum)
Pam Salazar , Thank you for taking time to come for your Medicare Wellness Visit. I appreciate your ongoing commitment to your health goals. Please review the following plan we discussed and let me know if I can assist you in the future.   These are the goals we discussed: Goals      Patient Stated   .  Eat more fruits and vegetables (pt-stated)    .  Increase physical activity (pt-stated)      Walk more for exercise        This is a list of the screening recommended for you and due dates:  Health Maintenance  Topic Date Due  . Flu Shot  01/23/2020  . Mammogram  01/17/2021  . Colon Cancer Screening  05/04/2025  . Tetanus Vaccine  02/24/2029  . DEXA scan (bone density measurement)  Completed  . COVID-19 Vaccine  Completed  .  Hepatitis C: One time screening is recommended by Center for Disease Control  (CDC) for  adults born from [birth year] through 1965.   Completed  . Pneumonia vaccines  Completed    Immunizations Immunization History  Administered Date(s) Administered  . Influenza Split 06/28/2013, 05/27/2014, 04/21/2017  . Influenza, High Dose Seasonal PF 05/19/2018, 02/25/2019  . Influenza-Unspecified 05/25/2015, 05/24/2017  . PFIZER SARS-COV-2 Vaccination 08/06/2019, 08/31/2019  . Pneumococcal Conjugate-13 08/03/2013  . Pneumococcal Polysaccharide-23 09/14/2012, 07/11/2015  . Tdap 02/25/2019  . Zoster 06/24/2008   Keep all routine maintenance appointments.   Follow up 03/06/20 @ 2:15  Advanced directives: End of life planning; Advance aging; Advanced directives discussed.  Copy of current HCPOA/Living Will requested.    Conditions/risks identified: none new.  Follow up in one year for your annual wellness visit.   Preventive Care 74 Years and Older, Female Preventive care refers to lifestyle choices and visits with your health care provider that can promote health and wellness. What does preventive care include?  A yearly physical exam. This is also called an annual  well check.  Dental exams once or twice a year.  Routine eye exams. Ask your health care provider how often you should have your eyes checked.  Personal lifestyle choices, including:  Daily care of your teeth and gums.  Regular physical activity.  Eating a healthy diet.  Avoiding tobacco and drug use.  Limiting alcohol use.  Practicing safe sex.  Taking low-dose aspirin every day.  Taking vitamin and mineral supplements as recommended by your health care provider. What happens during an annual well check? The services and screenings done by your health care provider during your annual well check will depend on your age, overall health, lifestyle risk factors, and family history of disease. Counseling  Your health care provider may ask you questions about your:  Alcohol use.  Tobacco use.  Drug use.  Emotional well-being.  Home and relationship well-being.  Sexual activity.  Eating habits.  History of falls.  Memory and ability to understand (cognition).  Work and work Statistician.  Reproductive health. Screening  You may have the following tests or measurements:  Height, weight, and BMI.  Blood pressure.  Lipid and cholesterol levels. These may be checked every 5 years, or more frequently if you are over 62 years old.  Skin check.  Lung cancer screening. You may have this screening every year starting at age 80 if you have a 30-pack-year history of smoking and currently smoke or have quit within the past 15 years.  Fecal occult blood test (FOBT) of the stool. You  may have this test every year starting at age 25.  Flexible sigmoidoscopy or colonoscopy. You may have a sigmoidoscopy every 5 years or a colonoscopy every 10 years starting at age 40.  Hepatitis C blood test.  Hepatitis B blood test.  Sexually transmitted disease (STD) testing.  Diabetes screening. This is done by checking your blood sugar (glucose) after you have not eaten for a while  (fasting). You may have this done every 1-3 years.  Bone density scan. This is done to screen for osteoporosis. You may have this done starting at age 20.  Mammogram. This may be done every 1-2 years. Talk to your health care provider about how often you should have regular mammograms. Talk with your health care provider about your test results, treatment options, and if necessary, the need for more tests. Vaccines  Your health care provider may recommend certain vaccines, such as:  Influenza vaccine. This is recommended every year.  Tetanus, diphtheria, and acellular pertussis (Tdap, Td) vaccine. You may need a Td booster every 10 years.  Zoster vaccine. You may need this after age 24.  Pneumococcal 13-valent conjugate (PCV13) vaccine. One dose is recommended after age 65.  Pneumococcal polysaccharide (PPSV23) vaccine. One dose is recommended after age 25. Talk to your health care provider about which screenings and vaccines you need and how often you need them. This information is not intended to replace advice given to you by your health care provider. Make sure you discuss any questions you have with your health care provider. Document Released: 07/07/2015 Document Revised: 02/28/2016 Document Reviewed: 04/11/2015 Elsevier Interactive Patient Education  2017 [place] Prevention in the Home Falls can cause injuries. They can happen to people of all ages. There are many things you can do to make your home safe and to help prevent falls. What can I do on the outside of my home?  Regularly fix the edges of walkways and driveways and fix any cracks.  Remove anything that might make you trip as you walk through a door, such as a raised step or threshold.  Trim any bushes or trees on the path to your home.  Use bright outdoor lighting.  Clear any walking paths of anything that might make someone trip, such as rocks or tools.  Regularly check to see if handrails are loose  or broken. Make sure that both sides of any steps have handrails.  Any raised decks and porches should have guardrails on the edges.  Have any leaves, snow, or ice cleared regularly.  Use sand or salt on walking paths during winter.  Clean up any spills in your garage right away. This includes oil or grease spills. What can I do in the bathroom?  Use night lights.  Install grab bars by the toilet and in the tub and shower. Do not use towel bars as grab bars.  Use non-skid mats or decals in the tub or shower.  If you need to sit down in the shower, use a plastic, non-slip stool.  Keep the floor dry. Clean up any water that spills on the floor as soon as it happens.  Remove soap buildup in the tub or shower regularly.  Attach bath mats securely with double-sided non-slip rug tape.  Do not have throw rugs and other things on the floor that can make you trip. What can I do in the bedroom?  Use night lights.  Make sure that you have a light by your bed that is  easy to reach.  Do not use any sheets or blankets that are too big for your bed. They should not hang down onto the floor.  Have a firm chair that has side arms. You can use this for support while you get dressed.  Do not have throw rugs and other things on the floor that can make you trip. What can I do in the kitchen?  Clean up any spills right away.  Avoid walking on wet floors.  Keep items that you use a lot in easy-to-reach places.  If you need to reach something above you, use a strong step stool that has a grab bar.  Keep electrical cords out of the way.  Do not use floor polish or wax that makes floors slippery. If you must use wax, use non-skid floor wax.  Do not have throw rugs and other things on the floor that can make you trip. What can I do with my stairs?  Do not leave any items on the stairs.  Make sure that there are handrails on both sides of the stairs and use them. Fix handrails that are  broken or loose. Make sure that handrails are as long as the stairways.  Check any carpeting to make sure that it is firmly attached to the stairs. Fix any carpet that is loose or worn.  Avoid having throw rugs at the top or bottom of the stairs. If you do have throw rugs, attach them to the floor with carpet tape.  Make sure that you have a light switch at the top of the stairs and the bottom of the stairs. If you do not have them, ask someone to add them for you. What else can I do to help prevent falls?  Wear shoes that:  Do not have high heels.  Have rubber bottoms.  Are comfortable and fit you well.  Are closed at the toe. Do not wear sandals.  If you use a stepladder:  Make sure that it is fully opened. Do not climb a closed stepladder.  Make sure that both sides of the stepladder are locked into place.  Ask someone to hold it for you, if possible.  Clearly mark and make sure that you can see:  Any grab bars or handrails.  First and last steps.  Where the edge of each step is.  Use tools that help you move around (mobility aids) if they are needed. These include:  Canes.  Walkers.  Scooters.  Crutches.  Turn on the lights when you go into a dark area. Replace any light bulbs as soon as they burn out.  Set up your furniture so you have a clear path. Avoid moving your furniture around.  If any of your floors are uneven, fix them.  If there are any pets around you, be aware of where they are.  Review your medicines with your doctor. Some medicines can make you feel dizzy. This can increase your chance of falling. Ask your doctor what other things that you can do to help prevent falls. This information is not intended to replace advice given to you by your health care provider. Make sure you discuss any questions you have with your health care provider. Document Released: 04/06/2009 Document Revised: 11/16/2015 Document Reviewed: 07/15/2014 Elsevier  Interactive Patient Education  2017 [employer].

## 2020-02-02 ENCOUNTER — Other Ambulatory Visit: Payer: Self-pay | Admitting: Family Medicine

## 2020-02-02 ENCOUNTER — Other Ambulatory Visit: Payer: Self-pay

## 2020-02-04 ENCOUNTER — Ambulatory Visit: Payer: Medicare Other | Admitting: Nurse Practitioner

## 2020-02-14 DIAGNOSIS — L821 Other seborrheic keratosis: Secondary | ICD-10-CM | POA: Diagnosis not present

## 2020-03-06 ENCOUNTER — Other Ambulatory Visit: Payer: Self-pay | Admitting: Family Medicine

## 2020-03-06 ENCOUNTER — Telehealth (INDEPENDENT_AMBULATORY_CARE_PROVIDER_SITE_OTHER): Payer: Medicare Other | Admitting: Family Medicine

## 2020-03-06 ENCOUNTER — Ambulatory Visit: Payer: Medicare Other | Admitting: Family Medicine

## 2020-03-06 ENCOUNTER — Encounter: Payer: Self-pay | Admitting: Family Medicine

## 2020-03-06 ENCOUNTER — Other Ambulatory Visit: Payer: Self-pay

## 2020-03-06 VITALS — Ht 62.5 in | Wt 122.0 lb

## 2020-03-06 DIAGNOSIS — F419 Anxiety disorder, unspecified: Secondary | ICD-10-CM

## 2020-03-06 DIAGNOSIS — Z9189 Other specified personal risk factors, not elsewhere classified: Secondary | ICD-10-CM | POA: Diagnosis not present

## 2020-03-06 DIAGNOSIS — F329 Major depressive disorder, single episode, unspecified: Secondary | ICD-10-CM | POA: Diagnosis not present

## 2020-03-06 DIAGNOSIS — E039 Hypothyroidism, unspecified: Secondary | ICD-10-CM

## 2020-03-06 NOTE — Assessment & Plan Note (Signed)
Well-controlled.  We will continue Zoloft and clonazepam at this time.

## 2020-03-06 NOTE — Assessment & Plan Note (Signed)
Plan for TSH at next visit.  Deferred at this time given COVID-19 pandemic.  Continue Synthroid.

## 2020-03-06 NOTE — Assessment & Plan Note (Signed)
Encouraged adding in some additional fruits and vegetables.  Encouraged getting back to walking.

## 2020-03-06 NOTE — Progress Notes (Signed)
Virtual Visit via telephone Note  This visit type was conducted due to national recommendations for restrictions regarding the COVID-19 pandemic (e.g. social distancing).  This format is felt to be most appropriate for this patient at this time.  All issues noted in this document were discussed and addressed.  No physical exam was performed (except for noted visual exam findings with Video Visits).   I connected with Pam Salazar today at  2:15 PM EDT by telephone and verified that I am speaking with the correct person using two identifiers. Location patient: home Location provider: work Persons participating in the virtual visit: patient, provider  I discussed the limitations, risks, security and privacy concerns of performing an evaluation and management service by telephone and the availability of in person appointments. I also discussed with the patient that there may be a patient responsible charge related to this service. The patient expressed understanding and agreed to proceed.  Interactive audio and video telecommunications were attempted between this provider and patient, however failed, due to patient having technical difficulties OR patient did not have access to video capability.  We continued and completed visit with audio only.   Reason for visit: f/u.  HPI: Hypothyroidism: taking synthroid. Doing well on this. Due for TSH in the near future.   Anxiety/depression: Patient notes this is very well controlled currently.  Increasing the dose of Zoloft was quite helpful.  She continues on clonazepam 1 tablet twice daily.  She has been on that since 1993.  A couple years ago she tried to taper down and had a significant panic attack.  No drowsiness with the clonazepam.  Patient notes she wants to get back to walking for exercise now that it is cooler.  She notes her diet is not terribly healthy as she does not eat very many fruits or vegetables.  She does eat grapes, blueberries, and  bananas.  The only vegetable she notes she can tolerate is baby spinach.  She knows she needs to add more fruits and vegetables.   ROS: See pertinent positives and negatives per HPI.  Past Medical History:  Diagnosis Date  . Arthritis    hips, lower back  . Breast lump 10/21/2011   Overview:  Negative diagnostic mammogram 09/2011   . Depression   . Fracture of rib of right side 08/17/2013  . Hypercholesterolemia   . Hypothyroidism   . Low blood pressure 10/25/2014  . Panic disorder    previous agoraphobia  . Seizures (HCC)    febrile - as infant  . Shoulder pain, left    bursa  . Vertigo    positional - rare  . Vitamin D deficiency     Past Surgical History:  Procedure Laterality Date  . ABDOMINAL HYSTERECTOMY  1981   partial - secondary to bleeding, ovaries not removed -   . CATARACT EXTRACTION W/ INTRAOCULAR LENS  IMPLANT, BILATERAL    . COLONOSCOPY WITH PROPOFOL N/A 05/05/2015   Procedure: COLONOSCOPY WITH PROPOFOL;  Surgeon: Lucilla Lame, MD;  Location: [facility];  Service: Endoscopy;  Laterality: N/A;  . MASS EXCISION Right 12/24/2017   Procedure: EXCISION MASS ( SHIN );  Surgeon: Katha Cabal, MD;  Location: [facility];  Service: Vascular;  Laterality: Right;  . ORIF FIBULA FRACTURE Right 2003   right lower extremitiy  . shoulder Left    bone suprs  . SHOULDER SURGERY  12/2015  . TONSILLECTOMY  1974    Family History  Problem Relation Age of  Onset  . Arthritis Mother   . Hyperlipidemia Mother   . Arthritis Father   . Prostate cancer Father   . Breast cancer Sister 44  . Heart disease Brother        heart attack  . Diabetes Brother   . Breast cancer Maternal Aunt 85  . Colon cancer Maternal Uncle     SOCIAL HX: Former smoker   Current Outpatient Medications:  .  clonazePAM (KLONOPIN) 1 MG tablet, TAKE 1 TABLET(1 MG) BY MOUTH TWICE DAILY AS NEEDED FOR ANXIETY, Disp: 60 tablet, Rfl: 0 .  diphenhydramine-acetaminophen (TYLENOL PM) 25-500 MG TABS  tablet, Take 1 tablet by mouth at bedtime. , Disp: , Rfl:  .  levothyroxine (SYNTHROID) 125 MCG tablet, TAKE 1 TABLET(125 MCG) BY MOUTH DAILY BEFORE BREAKFAST, Disp: 90 tablet, Rfl: 1 .  Melatonin 3 MG CAPS, Take 1 capsule by mouth at bedtime as needed., Disp: , Rfl:  .  meloxicam (MOBIC) 15 MG tablet, TAKE 1 TABLET(15 MG) BY MOUTH DAILY WITH FOOD AS NEEDED FOR PAIN, Disp: 30 tablet, Rfl: 0 .  Multiple Vitamins-Minerals (PRESERVISION AREDS 2 PO), Take 1 capsule by mouth 2 (two) times daily. , Disp: , Rfl:  .  progesterone (PROMETRIUM) 100 MG capsule, Take 100 mg by mouth at bedtime., Disp: , Rfl:  .  sertraline (ZOLOFT) 100 MG tablet, Take 2 tablets (200 mg total) by mouth daily., Disp: 180 tablet, Rfl: 1 .  simvastatin (ZOCOR) 20 MG tablet, TAKE 1 TABLET BY MOUTH EVERY EVENING, Disp: 90 tablet, Rfl: 1  EXAM: This is a telephone visit and thus no physical exam was completed.  ASSESSMENT AND PLAN:  Discussed the following assessment and plan:  Hypothyroidism Plan for TSH at next visit.  Deferred at this time given COVID-19 pandemic.  Continue Synthroid.  Anxiety and depression Well-controlled.  We will continue Zoloft and clonazepam at this time.  Has poorly balanced diet Encouraged adding in some additional fruits and vegetables.  Encouraged getting back to walking.    Orders Placed This Encounter  Procedures  . TSH    Standing Status:   Future    Standing Expiration Date:   03/06/2021    No orders of the defined types were placed in this encounter.    I discussed the assessment and treatment plan with the patient. The patient was provided an opportunity to ask questions and all were answered. The patient agreed with the plan and demonstrated an understanding of the instructions.   The patient was advised to call back or seek an in-person evaluation if the symptoms worsen or if the condition fails to improve as anticipated.  I provided 16 minutes of non-face-to-face time during  this encounter.   Tommi Rumps, MD

## 2020-03-09 ENCOUNTER — Encounter: Payer: Self-pay | Admitting: Family Medicine

## 2020-03-09 ENCOUNTER — Other Ambulatory Visit: Payer: Self-pay | Admitting: Family Medicine

## 2020-03-09 MED ORDER — MELOXICAM 15 MG PO TABS: 15.0000 mg | ORAL_TABLET | Freq: Every day | ORAL | 0 refills | Status: DC | PRN

## 2020-03-13 DIAGNOSIS — Z23 Encounter for immunization: Secondary | ICD-10-CM | POA: Diagnosis not present

## 2020-03-27 ENCOUNTER — Other Ambulatory Visit: Payer: Self-pay | Admitting: Family Medicine

## 2020-04-05 ENCOUNTER — Other Ambulatory Visit: Payer: Self-pay | Admitting: Family Medicine

## 2020-04-07 ENCOUNTER — Other Ambulatory Visit: Payer: Self-pay | Admitting: Family Medicine

## 2020-04-07 NOTE — Telephone Encounter (Signed)
Pt states that she needs refill of clonazePAM (KLONOPIN) 1 MG tablet ASAP and would also like 90 day supply so she doesn't get worried every month about getting them in time

## 2020-04-29 ENCOUNTER — Other Ambulatory Visit: Payer: Self-pay | Admitting: Family Medicine

## 2020-05-04 ENCOUNTER — Encounter: Payer: Self-pay | Admitting: Nurse Practitioner

## 2020-05-04 ENCOUNTER — Ambulatory Visit (INDEPENDENT_AMBULATORY_CARE_PROVIDER_SITE_OTHER): Payer: Medicare Other | Admitting: Nurse Practitioner

## 2020-05-04 ENCOUNTER — Other Ambulatory Visit: Payer: Self-pay | Admitting: Family Medicine

## 2020-05-04 ENCOUNTER — Other Ambulatory Visit: Payer: Self-pay

## 2020-05-04 ENCOUNTER — Ambulatory Visit (INDEPENDENT_AMBULATORY_CARE_PROVIDER_SITE_OTHER): Payer: Medicare Other

## 2020-05-04 VITALS — BP 102/70 | HR 73 | Temp 97.6°F | Ht 62.5 in | Wt 125.0 lb

## 2020-05-04 DIAGNOSIS — M7062 Trochanteric bursitis, left hip: Secondary | ICD-10-CM | POA: Insufficient documentation

## 2020-05-04 DIAGNOSIS — M25552 Pain in left hip: Secondary | ICD-10-CM | POA: Insufficient documentation

## 2020-05-04 MED ORDER — CLONAZEPAM 1 MG PO TABS
ORAL_TABLET | ORAL | 0 refills | Status: DC
Start: 2020-05-04 — End: 2020-05-04

## 2020-05-04 MED ORDER — CLONAZEPAM 1 MG PO TABS
ORAL_TABLET | ORAL | 0 refills | Status: DC
Start: 2020-05-04 — End: 2020-05-31

## 2020-05-04 NOTE — Progress Notes (Signed)
Established Patient Office Visit  Subjective:  Patient ID: Pam Salazar, female    DOB: Nov 13, 1945  Age: 74 y.o. MRN: [id number]  CC:  Chief Complaint  Patient presents with  . Acute Visit    left hip pain    HPI Pam Salazar is a 74 yo who presents for left hip pain onset yesterday.  Her left hip was mildly achy yesterday but not enough to slow her down.  She did start with Mobic 15 mg yesterday.  She got up this morning, and could barely bear weight on her left leg.  The hip was so painful.  She gingerly has a walked today with a limp. Certain positions, such as raising her leg to put on her shoes is excruciating in the left hip. No radiation to the back or knee or leg. She took another dose of Mobic today and it has helped some.  She has had no falls injury or excessive activity to cause pain in the hip.  She does get arthritis from time to time in her right hand but has not really had it in her left hip.  In the distant past,  she can recall having some right hip pain.     Past Medical History:  Diagnosis Date  . Arthritis    hips, lower back  . Breast lump 10/21/2011   Overview:  Negative diagnostic mammogram 09/2011   . Depression   . Fracture of rib of right side 08/17/2013  . Hypercholesterolemia   . Hypothyroidism   . Low blood pressure 10/25/2014  . Panic disorder    previous agoraphobia  . Seizures (HCC)    febrile - as infant  . Shoulder pain, left    bursa  . Vertigo    positional - rare  . Vitamin D deficiency     Past Surgical History:  Procedure Laterality Date  . ABDOMINAL HYSTERECTOMY  1981   partial - secondary to bleeding, ovaries not removed -   . CATARACT EXTRACTION W/ INTRAOCULAR LENS  IMPLANT, BILATERAL    . COLONOSCOPY WITH PROPOFOL N/A 05/05/2015   Procedure: COLONOSCOPY WITH PROPOFOL;  Surgeon: Lucilla Lame, MD;  Location: [place];  Service: Endoscopy;  Laterality: N/A;  . MASS EXCISION Right 12/24/2017   Procedure:  EXCISION MASS ( SHIN );  Surgeon: Katha Cabal, MD;  Location: [facility];  Service: Vascular;  Laterality: Right;  . ORIF FIBULA FRACTURE Right 2003   right lower extremitiy  . shoulder Left    bone suprs  . SHOULDER SURGERY  12/2015  . TONSILLECTOMY  1974    Family History  Problem Relation Age of Onset  . Arthritis Mother   . Hyperlipidemia Mother   . Arthritis Father   . Prostate cancer Father   . Breast cancer Sister 55  . Heart disease Brother        heart attack  . Diabetes Brother   . Breast cancer Maternal Aunt 85  . Colon cancer Maternal Uncle     Social History   Socioeconomic History  . Marital status: Married    Spouse name: Not on file  . Number of children: Not on file  . Years of education: Not on file  . Highest education level: Not on file  Occupational History  . Not on file  Tobacco Use  . Smoking status: Former Smoker    Types: Cigarettes  . Smokeless tobacco: Former Systems developer    Quit date: 07/26/2013  . Tobacco  comment:    Vaping Use  . Vaping Use: Never used  Substance and Sexual Activity  . Alcohol use: No    Alcohol/week: 0.0 standard drinks  . Drug use: No  . Sexual activity: Yes    Birth control/protection: Post-menopausal  Other Topics Concern  . Not on file  Social History Narrative  . Not on file   Social Determinants of Health   Financial Resource Strain: Low Risk   . Difficulty of Paying Living Expenses: Not hard at all  Food Insecurity: No Food Insecurity  . Worried About Charity fundraiser in the Last Year: Never true  . Ran Out of Food in the Last Year: Never true  Transportation Needs: No Transportation Needs  . Lack of Transportation (Medical): No  . Lack of Transportation (Non-Medical): No  Physical Activity:   . Days of Exercise per Week: Not on file  . Minutes of Exercise per Session: Not on file  Stress: No Stress Concern Present  . Feeling of Stress : Not at all  Social Connections: Socially Integrated  .  Frequency of Communication with Friends and Family: More than three times a week  . Frequency of Social Gatherings with Friends and Family: More than three times a week  . Attends Religious Services: More than 4 times per year  . Active Member of Clubs or Organizations: Yes  . Attends Archivist Meetings: More than 4 times per year  . Marital Status: Married  Human resources officer Violence: Not At Risk  . Fear of Current or Ex-Partner: No  . Emotionally Abused: No  . Physically Abused: No  . Sexually Abused: No    Outpatient Medications Prior to Visit  Medication Sig Dispense Refill  . clonazePAM (KLONOPIN) 1 MG tablet TAKE 1 TABLET(1 MG) BY MOUTH TWICE DAILY AS NEEDED FOR ANXIETY 60 tablet 0  . diphenhydramine-acetaminophen (TYLENOL PM) 25-500 MG TABS tablet Take 1 tablet by mouth at bedtime.     Marland Kitchen levothyroxine (SYNTHROID) 125 MCG tablet TAKE 1 TABLET(125 MCG) BY MOUTH DAILY BEFORE BREAKFAST 90 tablet 1  . Melatonin 3 MG CAPS Take 1 capsule by mouth at bedtime as needed.    . meloxicam (MOBIC) 15 MG tablet TAKE 1 TABLET(15 MG) BY MOUTH DAILY AS NEEDED FOR PAIN 30 tablet 0  . Multiple Vitamins-Minerals (PRESERVISION AREDS 2 PO) Take 1 capsule by mouth 2 (two) times daily.     . progesterone (PROMETRIUM) 100 MG capsule Take 100 mg by mouth at bedtime.    . sertraline (ZOLOFT) 100 MG tablet TAKE 2 TABLETS(200 MG) BY MOUTH DAILY 180 tablet 1  . simvastatin (ZOCOR) 20 MG tablet TAKE 1 TABLET BY MOUTH EVERY EVENING 90 tablet 1   No facility-administered medications prior to visit.    Allergies  Allergen Reactions  . Duloxetine Hcl Other (See Comments)    depression worsened  . Ezetimibe-Simvastatin Other (See Comments)    Joint pain    Review of Systems Pertinent positives noted in history of present illness and otherwise negative.   Objective:    Physical Exam Vitals reviewed.  Constitutional:      Appearance: She is normal weight.  HENT:     Head: Normocephalic.    Cardiovascular:     Rate and Rhythm: Normal rate and regular rhythm.     Pulses: Normal pulses.     Heart sounds: Normal heart sounds.  Pulmonary:     Effort: Pulmonary effort is normal.     Breath sounds: Normal  breath sounds.  Musculoskeletal:     Cervical back: Normal range of motion and neck supple.     Comments: Left hip tender and suspect trochanteric bursitis. Very good ROM hip. Neg back tenderness and normal ROM with neg SLR. Normal knee ROM. Walks with limp.   Skin:    General: Skin is warm and dry.  Neurological:     General: No focal deficit present.     Mental Status: She is alert and oriented to person, place, and time.  Psychiatric:        Mood and Affect: Mood normal.        Behavior: Behavior normal.     BP 102/70 (BP Location: Left Arm, Patient Position: Sitting, Cuff Size: Normal)   Pulse 73   Temp 97.6 F (36.4 C) (Oral)   Ht 5' 2.5" (1.588 m)   Wt 125 lb (56.7 kg)   SpO2 99%   BMI 22.50 kg/m  Wt Readings from Last 3 Encounters:  05/04/20 125 lb (56.7 kg)  03/06/20 122 lb (55.3 kg)  02/01/20 122 lb (55.3 kg)   Pulse Readings from Last 3 Encounters:  05/04/20 73  08/25/19 87  01/15/19 68    BP Readings from Last 3 Encounters:  05/04/20 102/70  09/29/19 102/62  08/25/19 120/70    Lab Results  Component Value Date   CHOL 183 08/25/2019   HDL 64.30 08/25/2019   LDLCALC 92 08/25/2019   TRIG 135.0 08/25/2019   CHOLHDL 3 08/25/2019      There are no preventive care reminders to display for this patient.  There are no preventive care reminders to display for this patient.  Lab Results  Component Value Date   TSH 2.89 11/25/2019   Lab Results  Component Value Date   WBC 6.7 09/18/2018   HGB 14.4 09/18/2018   HCT 42.7 09/18/2018   MCV 88.9 09/18/2018   PLT 206.0 09/18/2018   Lab Results  Component Value Date   NA 141 08/25/2019   K 4.2 08/25/2019   CO2 26 08/25/2019   GLUCOSE 90 08/25/2019   BUN 25 (H) 08/25/2019   CREATININE  0.82 08/25/2019   BILITOT 0.6 08/25/2019   ALKPHOS 55 08/25/2019   AST 25 08/25/2019   ALT 15 08/25/2019   PROT 6.9 08/25/2019   ALBUMIN 4.4 08/25/2019   CALCIUM 9.8 08/25/2019   ANIONGAP 12 04/10/2017   GFR 68.29 08/25/2019   Lab Results  Component Value Date   CHOL 183 08/25/2019   Lab Results  Component Value Date   HDL 64.30 08/25/2019   Lab Results  Component Value Date   LDLCALC 92 08/25/2019   Lab Results  Component Value Date   TRIG 135.0 08/25/2019   Lab Results  Component Value Date   CHOLHDL 3 08/25/2019   Lab Results  Component Value Date   HGBA1C 5.2 01/15/2019      Assessment & Plan:   Problem List Items Addressed This Visit      Musculoskeletal and Integument   Trochanteric bursitis of left hip     Other   Left hip pain - Primary   Relevant Orders   DG Hip Unilat W OR W/O Pelvis 2-3 Views Left   Ambulatory referral to Orthopedic Surgery      No orders of the defined types were placed in this encounter.  To Xray today.   I placed referral in to Dr. Mardelle Matte as requested.   Take your Mobic daily as needed. Tylenol Arthritis as  needed.  Salon pass- pain Lidocaine patch over the counter  Follow up in 2 weeks if no improvement.   Contact a health care provider if:  You cannot put weight on your leg.  Your pain or swelling continues or gets worse after one week.  It gets harder to walk.  You have a fever. Get help right away if:  You fall.  You have a sudden increase in pain and swelling in your hip.  Your hip is red or swollen or very tender to touch. Follow-up: Return in about 2 weeks (around 05/18/2020), or if symptoms worsen or fail to improve.  This visit occurred during the SARS-CoV-2 public health emergency.  Safety protocols were in place, including screening questions prior to the visit, additional usage of staff PPE, and extensive cleaning of exam room while observing appropriate contact time as indicated for  disinfecting solutions.   Denice Paradise, NP

## 2020-05-04 NOTE — Patient Instructions (Addendum)
To Xray today.   I placed referral in to Dr. Mardelle Matte as requested.   Take your Mobic daily as needed. Tylenol Arthritis as needed.  Salon pass- pain Lidocaine patch over the counter  Follow up in 2 weeks if no improvement.   Contact a health care provider if:  You cannot put weight on your leg.  Your pain or swelling continues or gets worse after one week.  It gets harder to walk.  You have a fever. Get help right away if:  You fall.  You have a sudden increase in pain and swelling in your hip.  Your hip is red or swollen or very tender to touch.  Hip Pain The hip is the joint between the upper legs and the lower pelvis. The bones, cartilage, tendons, and muscles of your hip joint support your body and allow you to move around. Hip pain can range from a minor ache to severe pain in one or both of your hips. The pain may be felt on the inside of the hip joint near the groin, or on the outside near the buttocks and upper thigh. You may also have swelling or stiffness in your hip area. Follow these instructions at home: Managing pain, stiffness, and swelling      If directed, put ice on the painful area. To do this: ? Put ice in a plastic bag. ? Place a towel between your skin and the bag. ? Leave the ice on for 20 minutes, 2-3 times a day.  If directed, apply heat to the affected area as often as told by your health care provider. Use the heat source that your health care provider recommends, such as a moist heat pack or a heating pad. ? Place a towel between your skin and the heat source. ? Leave the heat on for 20-30 minutes. ? Remove the heat if your skin turns bright red. This is especially important if you are unable to feel pain, heat, or cold. You may have a greater risk of getting burned. Activity  Do exercises as told by your health care provider.  Avoid activities that cause pain. General instructions   Take over-the-counter and prescription medicines only  as told by your health care provider.  Keep a journal of your symptoms. Write down: ? How often you have hip pain. ? The location of your pain. ? What the pain feels like. ? What makes the pain worse.  Sleep with a pillow between your legs on your most comfortable side.  Keep all follow-up visits as told by your health care provider. This is important. Contact a health care provider if:  You cannot put weight on your leg.  Your pain or swelling continues or gets worse after one week.  It gets harder to walk.  You have a fever. Get help right away if:  You fall.  You have a sudden increase in pain and swelling in your hip.  Your hip is red or swollen or very tender to touch. Summary  Hip pain can range from a minor ache to severe pain in one or both of your hips.  The pain may be felt on the inside of the hip joint near the groin, or on the outside near the buttocks and upper thigh.  Avoid activities that cause pain.  Write down how often you have hip pain, the location of the pain, what makes it worse, and what it feels like. This information is not intended to replace advice  given to you by your health care provider. Make sure you discuss any questions you have with your health care provider. Document Revised: 10/26/2018 Document Reviewed: 10/26/2018 Elsevier Patient Education  [place] is inflammation and irritation of a bursa, which is one of the small, fluid-filled sacs that cushion and protect the moving parts of your body. These sacs are located between bones and muscles, bones and muscle attachments, or bones and skin areas that are next to bones. A bursa protects those structures from the wear and tear that results from frequent movement. An inflamed bursa causes pain and swelling. Fluid may build up inside the sac. Bursitis is most common near joints, especially the knees, elbows, hips, and shoulders. What are the causes? This  condition may be caused by:  Injury from: ? A direct hit (blow), like falling on your knee or elbow. ? Overuse of a joint (repetitive stress).  Infection. This can happen if bacteria get into a bursa through a cut or scrape near a joint.  Diseases that cause joint inflammation, such as gout and rheumatoid arthritis. What increases the risk? You are more likely to develop this condition if you:  Have a job or hobby that involves a lot of repetitive stress on your joints.  Have a condition that weakens your body's defense system (immune system), such as diabetes, cancer, or HIV.  Do any of these often: ? Lift and reach overhead. ? Kneel or lean on hard surfaces. ? Run or walk. What are the signs or symptoms? The most common symptoms of this condition include:  Pain that gets worse when you move the affected body part or use it to support (bear) your body weight.  Inflammation.  Stiffness. Other symptoms include:  Redness.  Swelling.  Tenderness.  Warmth.  Pain that continues after rest.  Fever or chills. These may occur in bursitis that is caused by infection. How is this diagnosed? This condition may be diagnosed based on:  Your medical history and a physical exam.  Imaging tests, such as an MRI.  A procedure to drain fluid from the bursa with a needle (aspiration). The fluid may be checked for signs of infection or gout.  Blood tests to rule out other causes of inflammation. How is this treated? This condition can usually be treated at home with rest, ice, applying pressure (compression), and raising the body part that is affected (elevation). This is called RICE therapy. For mild bursitis, RICE therapy may be all you need. Other treatments may include:  NSAIDs to treat pain and inflammation.  Corticosteroid medicines to fight inflammation. These medicines may be injected into and around the area of bursitis.  Aspiration of fluid from the bursa to relieve  pain and improve movement.  Antibiotic medicine to treat an infected bursa.  A splint, brace, or walking aid, such as a cane.  Physical therapy if you continue to have pain or limited movement.  Surgery to remove a damaged or infected bursa. This may be needed if other treatments have not worked. Follow these instructions at home: Medicines  Take over-the-counter and prescription medicines only as told by your health care provider.  If you were prescribed an antibiotic medicine, take it as told by your health care provider. Do not stop taking the antibiotic even if you start to feel better. General instructions   Rest the affected area as told by your health care provider. ? If possible, raise (elevate) the affected area above  the level of your heart while you are sitting or lying down. ? Avoid activities that make pain worse.  Use splints, braces, pads, or walking aids as told by your health care provider.  If directed, put ice on the affected area: ? If you have a removable splint or brace, remove it as told by your health care provider. ? Put ice in a plastic bag. ? Place a towel between your skin and the bag or between your splint or brace and the bag. ? Leave the ice on for 20 minutes, 2-3 times a day.  Keep all follow-up visits as told by your health care provider. This is important. Preventing future episodes Take actions to help prevent future episodes of bursitis.  Wear knee pads if you kneel often.  Wear sturdy running or walking shoes that fit you well.  Take breaks regularly from repetitive activity.  Warm up by stretching before doing any activity that takes a lot of effort.  Maintain a healthy weight or lose weight as recommended by your health care provider. If you need help doing this, ask your health care provider.  Exercise regularly. Start any new physical activity gradually. Contact a health care provider if you:  Have a fever.  Have  chills.  Have bursitis that is not getting better with treatment or home care. Summary  Bursitis is inflammation and irritation of a bursa, which is one of the small, fluid-filled sacs that cushion and protect the moving parts of your body.  An inflamed bursa causes pain and swelling.  Bursitis is commonly diagnosed with a physical exam, but other tests are sometimes needed.  This condition can usually be treated at home with rest, ice, applying pressure (compression), and raising the body part that is affected (elevation). This is called RICE therapy. This information is not intended to replace advice given to you by your health care provider. Make sure you discuss any questions you have with your health care provider. Document Revised: 05/23/2017 Document Reviewed: 04/25/2017 Elsevier Patient Education  2020 [employer].

## 2020-05-11 ENCOUNTER — Other Ambulatory Visit: Payer: Self-pay | Admitting: Family Medicine

## 2020-05-12 DIAGNOSIS — M7062 Trochanteric bursitis, left hip: Secondary | ICD-10-CM | POA: Diagnosis not present

## 2020-05-12 DIAGNOSIS — M160 Bilateral primary osteoarthritis of hip: Secondary | ICD-10-CM | POA: Diagnosis not present

## 2020-05-31 ENCOUNTER — Other Ambulatory Visit: Payer: Self-pay | Admitting: Family Medicine

## 2020-06-05 ENCOUNTER — Encounter: Payer: Self-pay | Admitting: Family Medicine

## 2020-06-05 ENCOUNTER — Other Ambulatory Visit: Payer: Self-pay

## 2020-06-05 ENCOUNTER — Ambulatory Visit (INDEPENDENT_AMBULATORY_CARE_PROVIDER_SITE_OTHER): Payer: Medicare Other | Admitting: Family Medicine

## 2020-06-05 VITALS — BP 90/65 | HR 75 | Temp 97.7°F | Ht 62.5 in | Wt 126.2 lb

## 2020-06-05 DIAGNOSIS — F32A Depression, unspecified: Secondary | ICD-10-CM | POA: Diagnosis not present

## 2020-06-05 DIAGNOSIS — F419 Anxiety disorder, unspecified: Secondary | ICD-10-CM | POA: Diagnosis not present

## 2020-06-05 DIAGNOSIS — E039 Hypothyroidism, unspecified: Secondary | ICD-10-CM

## 2020-06-05 DIAGNOSIS — R2689 Other abnormalities of gait and mobility: Secondary | ICD-10-CM | POA: Insufficient documentation

## 2020-06-05 LAB — COMPREHENSIVE METABOLIC PANEL
ALT: 11 U/L (ref 0–35)
AST: 19 U/L (ref 0–37)
Albumin: 4.3 g/dL (ref 3.5–5.2)
Alkaline Phosphatase: 53 U/L (ref 39–117)
BUN: 37 mg/dL — ABNORMAL HIGH (ref 6–23)
CO2: 28 mEq/L (ref 19–32)
Calcium: 9 mg/dL (ref 8.4–10.5)
Chloride: 105 mEq/L (ref 96–112)
Creatinine, Ser: 0.88 mg/dL (ref 0.40–1.20)
GFR: 64.93 mL/min (ref >60.00)
Glucose, Bld: 89 mg/dL (ref 70–99)
Potassium: 4.1 mEq/L (ref 3.5–5.1)
Sodium: 141 mEq/L (ref 135–145)
Total Bilirubin: 0.5 mg/dL (ref 0.2–1.2)
Total Protein: 6.4 g/dL (ref 6.0–8.3)

## 2020-06-05 LAB — CBC
HCT: 38.2 % (ref 36.0–46.0)
Hemoglobin: 12.6 g/dL (ref 12.0–15.0)
MCHC: 33 g/dL (ref 30.0–36.0)
MCV: 87.2 fl (ref 78.0–100.0)
Platelets: 182 10*3/uL (ref 150.0–400.0)
RBC: 4.38 Mil/uL (ref 3.87–5.11)
RDW: 14.2 % (ref 11.5–15.5)
WBC: 5.1 10*3/uL (ref 4.0–10.5)

## 2020-06-05 LAB — TSH: TSH: 3.64 u[IU]/mL (ref 0.35–4.50)

## 2020-06-05 LAB — VITAMIN B12: Vitamin B-12: 560 pg/mL (ref 211–911)

## 2020-06-05 NOTE — Assessment & Plan Note (Signed)
Check TSH. Continue synthroid 125 mcg daily.

## 2020-06-05 NOTE — Progress Notes (Signed)
  Tommi Rumps, MD Phone: [phone]  Pam Salazar is a 74 y.o. female who presents today for f/u.  HYPOTHYROIDISM Disease Monitoring Weight changes: no  Skin Changes: no  Heat/Cold intolerance: no  Medication Monitoring Compliance:  Taking synthroid   Last TSH:   Lab Results  Component Value Date   TSH 2.89 11/25/2019   Anxiety/depression: notes this is remarkably good. Taking zoloft daily and klonopin BID. No drowsiness with this. No alcohol intake. No SI.  Balance issue: notes for the past couple of weeks she has felt as though her balance is off when she walks. It feels like she veers to either side. No falls. No vertiginous symptoms. No light headedness. No numbness, weakness, or vision changes. No new medications or supplements.   Social History   Tobacco Use  Smoking Status Former Smoker  . Types: Cigarettes  Smokeless Tobacco Former Systems developer  . Quit date: 07/26/2013  Tobacco Comment         ROS see history of present illness  Objective  Physical Exam Vitals:   06/05/20 1110  BP: 90/65  Pulse: 75  Temp: 97.7 F (36.5 C)  SpO2: 99%    BP Readings from Last 3 Encounters:  06/05/20 90/65  05/04/20 102/70  09/29/19 102/62   Wt Readings from Last 3 Encounters:  06/05/20 126 lb 3.2 oz (57.2 kg)  05/04/20 125 lb (56.7 kg)  03/06/20 122 lb (55.3 kg)    Physical Exam Constitutional:      General: She is not in acute distress.    Appearance: She is not diaphoretic.  Cardiovascular:     Rate and Rhythm: Normal rate and regular rhythm.     Heart sounds: Normal heart sounds.  Pulmonary:     Effort: Pulmonary effort is normal.     Breath sounds: Normal breath sounds.  Musculoskeletal:        General: No edema.  Skin:    General: Skin is warm and dry.  Neurological:     Mental Status: She is alert.     Comments: PERRL, EOMI, V1-V3 sensation intact bilaterally, hearing intact bilaterally, shoulder shrug intact bilaterally, opens and closes eyes  equally, 5/5 strength in bilateral biceps, triceps, grip, quads, hamstrings, plantar and dorsiflexion, sensation to light touch intact in bilateral UE and LE, gait with very mild veering to either side, finger to nose intact bilaterally, rapid alternating movements intact bilaterally, negative rhomberg, no pronator drift      Assessment/Plan: Please see individual problem list.  Problem List Items Addressed This Visit    Anxiety and depression    Well controlled. Continue zoloft 200 mg daily and klonopin 1 mg BID prn.       Balance problem - Primary    Undetermined cause. Exam is reassuring. We will check labs for possible causes. Discussed the potential for imaging if negative lab work up.       Relevant Orders   CBC   Comp Met (CMET)   B12   Hypothyroidism    Check TSH. Continue synthroid 125 mcg daily.       Relevant Orders   TSH         This visit occurred during the SARS-CoV-2 public health emergency.  Safety protocols were in place, including screening questions prior to the visit, additional usage of staff PPE, and extensive cleaning of exam room while observing appropriate contact time as indicated for disinfecting solutions.    Tommi Rumps, MD [place]

## 2020-06-05 NOTE — Assessment & Plan Note (Signed)
Well controlled. Continue zoloft 200 mg daily and klonopin 1 mg BID prn.

## 2020-06-05 NOTE — Patient Instructions (Signed)
Nice to see you.  We will check labs today to look for a cause of your balance issues.

## 2020-06-05 NOTE — Assessment & Plan Note (Signed)
Undetermined cause. Exam is reassuring. We will check labs for possible causes. Discussed the potential for imaging if negative lab work up.

## 2020-06-07 ENCOUNTER — Other Ambulatory Visit: Payer: Self-pay | Admitting: Family Medicine

## 2020-06-09 ENCOUNTER — Telehealth: Payer: Self-pay | Admitting: Family Medicine

## 2020-06-09 NOTE — Telephone Encounter (Signed)
LMTCB

## 2020-06-09 NOTE — Telephone Encounter (Signed)
Patient was returning call 

## 2020-06-09 NOTE — Telephone Encounter (Signed)
Pt called back returning your call about lab work

## 2020-06-12 ENCOUNTER — Encounter: Payer: Self-pay | Admitting: Family Medicine

## 2020-06-12 DIAGNOSIS — R2689 Other abnormalities of gait and mobility: Secondary | ICD-10-CM

## 2020-06-12 DIAGNOSIS — R413 Other amnesia: Secondary | ICD-10-CM

## 2020-06-12 NOTE — Telephone Encounter (Signed)
Spoke with patient about lab results.

## 2020-06-19 ENCOUNTER — Telehealth: Payer: Self-pay | Admitting: Family Medicine

## 2020-06-19 NOTE — Telephone Encounter (Signed)
She currently takes klonopin for anxiety. That is similar to what I would prescribe for the MRI, so I would advise that she take 1 clonazepam 1 hour prior to the MRI.  She will need to have somebody drive her to and from the MRI.

## 2020-06-19 NOTE — Telephone Encounter (Signed)
I spoke with pt about her MRI in [facility]. I asked pt if she was claustrophobic she stated yes. Pt wanted to schedule MRI before the end of the year due to her deductible. I advised to pt that if we change the appt to a different facility she may not get a appt before the end of the year. Pt wanted to keep the appt and wanted to know if she can get something to relax her. Pt appt is on 06/21/2020. Please advise and Thank you!

## 2020-06-19 NOTE — Telephone Encounter (Signed)
Ok, I was able to get pt scheduled on 06/21/2020 arriving at 2 at [facility]. I lft pt a vm with the information. Thank you!

## 2020-06-19 NOTE — Telephone Encounter (Signed)
Patient was wondering if it was possible to have the brain MRI done before the end of year. She has meant her out of pocket expenses for the year.

## 2020-06-21 ENCOUNTER — Other Ambulatory Visit: Payer: Self-pay

## 2020-06-21 ENCOUNTER — Ambulatory Visit (HOSPITAL_COMMUNITY)
Admission: RE | Admit: 2020-06-21 | Discharge: 2020-06-21 | Disposition: A | Discharge status: Home / Self Care | Payer: Medicare Other | Source: Ambulatory Visit | Attending: Family Medicine | Admitting: Family Medicine

## 2020-06-21 DIAGNOSIS — G319 Degenerative disease of nervous system, unspecified: Secondary | ICD-10-CM | POA: Diagnosis not present

## 2020-06-21 DIAGNOSIS — R2689 Other abnormalities of gait and mobility: Secondary | ICD-10-CM | POA: Insufficient documentation

## 2020-06-21 DIAGNOSIS — I6782 Cerebral ischemia: Secondary | ICD-10-CM | POA: Diagnosis not present

## 2020-06-21 DIAGNOSIS — R413 Other amnesia: Secondary | ICD-10-CM | POA: Diagnosis not present

## 2020-06-21 DIAGNOSIS — R270 Ataxia, unspecified: Secondary | ICD-10-CM | POA: Diagnosis not present

## 2020-06-21 NOTE — Telephone Encounter (Signed)
Spoke with pt to let her that Dr. Birdie Sons stated that she would take 1 clonazepam 1 hour before her MRI. Pt gave a verbal understanding.

## 2020-06-26 ENCOUNTER — Other Ambulatory Visit: Payer: Self-pay | Admitting: Family Medicine

## 2020-06-26 DIAGNOSIS — R270 Ataxia, unspecified: Secondary | ICD-10-CM

## 2020-06-27 ENCOUNTER — Encounter: Payer: Self-pay | Admitting: Neurology

## 2020-07-03 DIAGNOSIS — M25561 Pain in right knee: Secondary | ICD-10-CM | POA: Diagnosis not present

## 2020-07-04 ENCOUNTER — Other Ambulatory Visit: Payer: Self-pay | Admitting: Family Medicine

## 2020-07-04 NOTE — Telephone Encounter (Signed)
Refill request for Clonazepam 1 mg tabs.  LOV - 06/05/20 Next OV - not scheduled Last refilled - 05/31/20

## 2020-07-07 ENCOUNTER — Telehealth: Payer: Self-pay | Admitting: Family Medicine

## 2020-07-07 NOTE — Telephone Encounter (Signed)
Pt called and asked if Dr. Caryl Bis was in. I said yes he is in clinic right now and she said "well yea if he is working".. I asked her what was the message she would like to get back and she said never mind and hung up

## 2020-08-03 ENCOUNTER — Other Ambulatory Visit: Payer: Self-pay | Admitting: Family Medicine

## 2020-08-10 ENCOUNTER — Other Ambulatory Visit: Payer: Self-pay

## 2020-08-10 ENCOUNTER — Encounter: Payer: Self-pay | Admitting: Internal Medicine

## 2020-08-10 ENCOUNTER — Ambulatory Visit (INDEPENDENT_AMBULATORY_CARE_PROVIDER_SITE_OTHER): Payer: Medicare Other | Admitting: Internal Medicine

## 2020-08-10 ENCOUNTER — Telehealth: Payer: Self-pay | Admitting: Family Medicine

## 2020-08-10 VITALS — BP 110/70 | HR 60 | Temp 97.9°F | Ht 62.0 in | Wt 126.0 lb

## 2020-08-10 DIAGNOSIS — M79604 Pain in right leg: Secondary | ICD-10-CM | POA: Diagnosis not present

## 2020-08-10 DIAGNOSIS — M160 Bilateral primary osteoarthritis of hip: Secondary | ICD-10-CM | POA: Diagnosis not present

## 2020-08-10 DIAGNOSIS — M25561 Pain in right knee: Secondary | ICD-10-CM | POA: Diagnosis not present

## 2020-08-10 DIAGNOSIS — M25461 Effusion, right knee: Secondary | ICD-10-CM | POA: Diagnosis not present

## 2020-08-10 MED ORDER — HYDROCODONE-ACETAMINOPHEN 5-300 MG PO TABS: 1.0000 | ORAL_TABLET | Freq: Two times a day (BID) | ORAL | 0 refills | Status: DC | PRN

## 2020-08-10 MED ORDER — HYDROCODONE-ACETAMINOPHEN 5-325 MG PO TABS
1.0000 | ORAL_TABLET | Freq: Two times a day (BID) | ORAL | 0 refills | Status: DC | PRN
Start: 2020-08-10 — End: 2020-09-04

## 2020-08-10 NOTE — Telephone Encounter (Signed)
It looks like you saw her for this.

## 2020-08-10 NOTE — Progress Notes (Signed)
Chief Complaint  Patient presents with  . Knee Pain    R knee pain, radiates up to hip and down towards calf. Pain 8/10. Had kenalog injection Jan. 10 2021, pt stated she still did not have much relief.    F/u  1. B/l hip pain previous Xray moderate to severe right hip OA and mild left hip OA hip Xray 05/04/20 she has radiating pain down her thigh [facility] ortho Dr. Mardelle Matte states she will need replacement in the future  Of note  Xray low back 04/12/14  with There is about 5 mm anterolisthesis L3 on L4 vertebral body. Patent degenerative changes L4 and L5 level. Disc space flattening at L3-L4 and L5-S1 level.    2. Reason for visit today she was up until 3 am in aching pain in right lower leg/knee pain and swelling 10/10 tried 2 tylenol, iced x 20 min w/o relief trouble with gait and weight bearing right lower leg/walking no h/o trauma. Pain in hte back of right knee to posterior thigh to right buttock though see does have #1 above going on. She saw [facility] 07/03/20 had Xray with ? Effusion right knee and was given steroid injection which lasted x 2 weeks and told to f/u in 4 weeks.  Pain is 8/10. She has not had an injury and before this was active walking  She has had prior surgery to remove cysts in right lateral lower leg but there is still one present general surgery Dr. Bary Castilla did this    Review of Systems  Constitutional: Negative for weight loss.  Respiratory: Negative for shortness of breath.   Cardiovascular: Negative for chest pain.  Musculoskeletal: Positive for joint pain. Negative for falls.   Past Medical History:  Diagnosis Date  . Arthritis    hips, lower back  . Breast lump 10/21/2011   Overview:  Negative diagnostic mammogram 09/2011   . Depression   . Fracture of rib of right side 08/17/2013  . Hypercholesterolemia   . Hypothyroidism   . Low blood pressure 10/25/2014  . Panic disorder    previous agoraphobia  . Seizures (HCC)    febrile - as infant  . Shoulder pain, left     bursa  . Vertigo    positional - rare  . Vitamin D deficiency    Past Surgical History:  Procedure Laterality Date  . ABDOMINAL HYSTERECTOMY  1981   partial - secondary to bleeding, ovaries not removed -   . CATARACT EXTRACTION W/ INTRAOCULAR LENS  IMPLANT, BILATERAL    . COLONOSCOPY WITH PROPOFOL N/A 05/05/2015   Procedure: COLONOSCOPY WITH PROPOFOL;  Surgeon: Lucilla Lame, MD;  Location: [facility];  Service: Endoscopy;  Laterality: N/A;  . MASS EXCISION Right 12/24/2017   Procedure: EXCISION MASS ( SHIN );  Surgeon: Katha Cabal, MD;  Location: [facility];  Service: Vascular;  Laterality: Right;  . ORIF FIBULA FRACTURE Right 2003   right lower extremitiy  . shoulder Left    bone suprs  . SHOULDER SURGERY  12/2015  . TONSILLECTOMY  1974   Family History  Problem Relation Age of Onset  . Arthritis Mother   . Hyperlipidemia Mother   . Arthritis Father   . Prostate cancer Father   . Breast cancer Sister 77  . Heart disease Brother        heart attack  . Diabetes Brother   . Breast cancer Maternal Aunt 85  . Colon cancer Maternal Uncle    Social History  Socioeconomic History  . Marital status: Married    Spouse name: Not on file  . Number of children: Not on file  . Years of education: Not on file  . Highest education level: Not on file  Occupational History  . Not on file  Tobacco Use  . Smoking status: Former Smoker    Types: Cigarettes  . Smokeless tobacco: Former Systems developer    Quit date: 07/26/2013  . Tobacco comment:    Vaping Use  . Vaping Use: Never used  Substance and Sexual Activity  . Alcohol use: No    Alcohol/week: 0.0 standard drinks  . Drug use: No  . Sexual activity: Yes    Birth control/protection: Post-menopausal  Other Topics Concern  . Not on file  Social History Narrative   Former Marine scientist   Social Determinants of Health   Financial Resource Strain: [place]   . Difficulty of Paying Living Expenses: Not hard at all  Food  Insecurity: No Food Insecurity  . Worried About Charity fundraiser in the Last Year: Never true  . Ran Out of Food in the Last Year: Never true  Transportation Needs: No Transportation Needs  . Lack of Transportation (Medical): No  . Lack of Transportation (Non-Medical): No  Physical Activity: Not on file  Stress: No Stress Concern Present  . Feeling of Stress : Not at all  Social Connections: Socially Integrated  . Frequency of Communication with Friends and Family: More than three times a week  . Frequency of Social Gatherings with Friends and Family: More than three times a week  . Attends Religious Services: More than 4 times per year  . Active Member of Clubs or Organizations: Yes  . Attends Archivist Meetings: More than 4 times per year  . Marital Status: Married  Human resources officer Violence: Not At Risk  . Fear of Current or Ex-Partner: No  . Emotionally Abused: No  . Physically Abused: No  . Sexually Abused: No   Current Meds  Medication Sig  . clonazePAM (KLONOPIN) 1 MG tablet TAKE 1 TABLET(1 MG) BY MOUTH TWICE DAILY AS NEEDED FOR ANXIETY  . diphenhydramine-acetaminophen (TYLENOL PM) 25-500 MG TABS tablet Take 1 tablet by mouth at bedtime.  Marland Kitchen HYDROcodone-acetaminophen (NORCO/VICODIN) 5-325 MG tablet Take 1 tablet by mouth 2 (two) times daily as needed for moderate pain.  Marland Kitchen levothyroxine (SYNTHROID) 125 MCG tablet TAKE 1 TABLET(125 MCG) BY MOUTH DAILY BEFORE BREAKFAST  . Melatonin 3 MG CAPS Take 1 capsule by mouth at bedtime as needed.  . meloxicam (MOBIC) 15 MG tablet TAKE 1 TABLET(15 MG) BY MOUTH DAILY AS NEEDED FOR PAIN  . Multiple Vitamins-Minerals (PRESERVISION AREDS 2 PO) Take 1 capsule by mouth 2 (two) times daily.   . sertraline (ZOLOFT) 100 MG tablet TAKE 2 TABLETS(200 MG) BY MOUTH DAILY  . simvastatin (ZOCOR) 20 MG tablet TAKE 1 TABLET BY MOUTH EVERY EVENING  . [DISCONTINUED] HYDROcodone-Acetaminophen 5-300 MG TABS Take 1 tablet by mouth 2 (two) times  daily as needed.   Allergies  Allergen Reactions  . Duloxetine Hcl Other (See Comments)    depression worsened  . Ezetimibe-Simvastatin Other (See Comments)    Joint pain   Recent Results (from the past 2160 hour(s))  TSH     Status: None   Collection Time: 06/05/20 11:24 AM  Result Value Ref Range   TSH 3.64 0.35 - 4.50 uIU/mL  CBC     Status: None   Collection Time: 06/05/20 11:24 AM  Result Value Ref Range   WBC 5.1 4.0 - 10.5 K/uL   RBC 4.38 3.87 - 5.11 Mil/uL   Platelets 182.0 150.0 - 400.0 K/uL   Hemoglobin 12.6 12.0 - 15.0 g/dL   HCT 38.2 36.0 - 46.0 %   MCV 87.2 78.0 - 100.0 fl   MCHC 33.0 30.0 - 36.0 g/dL   RDW 14.2 11.5 - 15.5 %  Comp Met (CMET)     Status: Abnormal   Collection Time: 06/05/20 11:24 AM  Result Value Ref Range   Sodium 141 135 - 145 mEq/L   Potassium 4.1 3.5 - 5.1 mEq/L   Chloride 105 96 - 112 mEq/L   CO2 28 19 - 32 mEq/L   Glucose, Bld 89 70 - 99 mg/dL   BUN 37 (H) 6 - 23 mg/dL   Creatinine, Ser 0.88 0.40 - 1.20 mg/dL   Total Bilirubin 0.5 0.2 - 1.2 mg/dL   Alkaline Phosphatase 53 39 - 117 U/L   AST 19 0 - 37 U/L   ALT 11 0 - 35 U/L   Total Protein 6.4 6.0 - 8.3 g/dL   Albumin 4.3 3.5 - 5.2 g/dL   GFR 64.93 >60.00 mL/min    Comment: Calculated using the CKD-EPI Creatinine Equation (2021)   Calcium 9.0 8.4 - 10.5 mg/dL  B12     Status: None   Collection Time: 06/05/20 11:24 AM  Result Value Ref Range   Vitamin B-12 560 211 - 911 pg/mL   Objective  Body mass index is 23.05 kg/m. Wt Readings from Last 3 Encounters:  08/10/20 126 lb (57.2 kg)  06/05/20 126 lb 3.2 oz (57.2 kg)  05/04/20 125 lb (56.7 kg)   Temp Readings from Last 3 Encounters:  08/10/20 97.9 F (36.6 C)  06/05/20 97.7 F (36.5 C) (Oral)  05/04/20 97.6 F (36.4 C) (Oral)   BP Readings from Last 3 Encounters:  08/10/20 110/70  06/05/20 90/65  05/04/20 102/70   Pulse Readings from Last 3 Encounters:  08/10/20 60  06/05/20 75  05/04/20 73    Physical  Exam Vitals and nursing note reviewed.  Constitutional:      Appearance: Normal appearance. She is well-developed and well-groomed.  HENT:     Head: Normocephalic and atraumatic.  Eyes:     Conjunctiva/sclera: Conjunctivae normal.     Pupils: Pupils are equal, round, and reactive to light.  Cardiovascular:     Rate and Rhythm: Normal rate and regular rhythm.     Heart sounds: Normal heart sounds. No murmur heard.   Pulmonary:     Effort: Pulmonary effort is normal.     Breath sounds: Normal breath sounds.  Musculoskeletal:     Right knee: Swelling present. Decreased range of motion. Tenderness present over the medial joint line and lateral joint line.       Legs:  Skin:    General: Skin is warm and dry.       Neurological:     General: No focal deficit present.     Mental Status: She is alert and oriented to person, place, and time. Mental status is at baseline.     Gait: Gait abnormal.  Psychiatric:        Attention and Perception: Attention and perception normal.        Mood and Affect: Mood and affect normal.        Speech: Speech normal.        Behavior: Behavior normal. Behavior is cooperative.  Thought Content: Thought content normal.        Cognition and Memory: Cognition and memory normal.        Judgment: Judgment normal.     Assessment  Plan  Acute pain of right knee Xray 07/03/20 and ? Effusion pt still has pain since 2 weeks after steroid injection 07/03/20 - Plan: MR Knee Right Wo Contrast, Ambulatory referral to Physical Therapy [facility], HYDROcodone-acetaminophen (NORCO/VICODIN) 5-325 MG tablet F/u Dr. Mardelle Matte  Rx right knee brace   Right leg pain r/o dvt/bakers cyst ? Right lower leg cyst s/p prior lateral leg cysts removal - Plan: US Venous Img Lower Unilateral Right  Primary osteoarthritis of both hips - Plan: Ambulatory referral to Physical Therapy, HYDROcodone-acetaminophen (NORCO/VICODIN) 5-325 MG tablet  F/u Dr. Mardelle Matte    Provider: Dr. Olivia Mackie  [facility]

## 2020-08-10 NOTE — Telephone Encounter (Signed)
[facility] called they do not carry the HYDROcodone-Acetaminophen 5-300 MG TABS They only have the 5-325mg 

## 2020-08-10 NOTE — Patient Instructions (Signed)
voltaren gel 4 x per day  theraworx leg cramps  Knee brace  Knee Exercises Ask your health care provider which exercises are safe for you. Do exercises exactly as told by your health care provider and adjust them as directed. It is normal to feel mild stretching, pulling, tightness, or discomfort as you do these exercises. Stop right away if you feel sudden pain or your pain gets worse. Do not begin these exercises until told by your health care provider. Stretching and range-of-motion exercises These exercises warm up your muscles and joints and improve the movement and flexibility of your knee. These exercises also help to relieve pain and swelling. Knee extension, prone 1. Lie on your abdomen (prone position) on a bed. 2. Place your left / right knee just beyond the edge of the surface so your knee is not on the bed. You can put a towel under your left / right thigh just above your kneecap for comfort. 3. Relax your leg muscles and allow gravity to straighten your knee (extension). You should feel a stretch behind your left / right knee. 4. Hold this position for __________ seconds. 5. Scoot up so your knee is supported between repetitions. Repeat __________ times. Complete this exercise __________ times a day. Knee flexion, active 1. Lie on your back with both legs straight. If this causes back discomfort, bend your left / right knee so your foot is flat on the floor. 2. Slowly slide your left / right heel back toward your buttocks. Stop when you feel a gentle stretch in the front of your knee or thigh (flexion). 3. Hold this position for __________ seconds. 4. Slowly slide your left / right heel back to the starting position. Repeat __________ times. Complete this exercise __________ times a day.   Quadriceps stretch, prone 1. Lie on your abdomen on a firm surface, such as a bed or padded floor. 2. Bend your left / right knee and hold your ankle. If you cannot reach your ankle or pant  leg, loop a belt around your foot and grab the belt instead. 3. Gently pull your heel toward your buttocks. Your knee should not slide out to the side. You should feel a stretch in the front of your thigh and knee (quadriceps). 4. Hold this position for __________ seconds. Repeat __________ times. Complete this exercise __________ times a day.   Hamstring, supine 1. Lie on your back (supine position). 2. Loop a belt or towel over the ball of your left / right foot. The ball of your foot is on the walking surface, right under your toes. 3. Straighten your left / right knee and slowly pull on the belt to raise your leg until you feel a gentle stretch behind your knee (hamstring). ? Do not let your knee bend while you do this. ? Keep your other leg flat on the floor. 4. Hold this position for __________ seconds. Repeat __________ times. Complete this exercise __________ times a day. Strengthening exercises These exercises build strength and endurance in your knee. Endurance is the ability to use your muscles for a long time, even after they get tired. Quadriceps, isometric This exercise stretches the muscles in front of your thigh (quadriceps) without moving your knee joint (isometric). 1. Lie on your back with your left / right leg extended and your other knee bent. Put a rolled towel or small pillow under your knee if told by your health care provider. 2. Slowly tense the muscles in the front of  your left / right thigh. You should see your kneecap slide up toward your hip or see increased dimpling just above the knee. This motion will push the back of the knee toward the floor. 3. For __________ seconds, hold the muscle as tight as you can without increasing your pain. 4. Relax the muscles slowly and completely. Repeat __________ times. Complete this exercise __________ times a day.   Straight leg raises This exercise stretches the muscles in front of your thigh (quadriceps) and the muscles that  move your hips (hip flexors). 1. Lie on your back with your left / right leg extended and your other knee bent. 2. Tense the muscles in the front of your left / right thigh. You should see your kneecap slide up or see increased dimpling just above the knee. Your thigh may even shake a bit. 3. Keep these muscles tight as you raise your leg 4-6 inches (10-15 cm) off the floor. Do not let your knee bend. 4. Hold this position for __________ seconds. 5. Keep these muscles tense as you lower your leg. 6. Relax your muscles slowly and completely after each repetition. Repeat __________ times. Complete this exercise __________ times a day. Hamstring, isometric 1. Lie on your back on a firm surface. 2. Bend your left / right knee about __________ degrees. 3. Dig your left / right heel into the surface as if you are trying to pull it toward your buttocks. Tighten the muscles in the back of your thighs (hamstring) to "dig" as hard as you can without increasing any pain. 4. Hold this position for __________ seconds. 5. Release the tension gradually and allow your muscles to relax completely for __________ seconds after each repetition. Repeat __________ times. Complete this exercise __________ times a day. Hamstring curls If told by your health care provider, do this exercise while wearing ankle weights. Begin with __________ lb weights. Then increase the weight by 1 lb (0.5 kg) increments. Do not wear ankle weights that are more than __________ lb. 1. Lie on your abdomen with your legs straight. 2. Bend your left / right knee as far as you can without feeling pain. Keep your hips flat against the floor. 3. Hold this position for __________ seconds. 4. Slowly lower your leg to the starting position. Repeat __________ times. Complete this exercise __________ times a day.   Squats This exercise strengthens the muscles in front of your thigh and knee (quadriceps). 1. Stand in front of a table, with your  feet and knees pointing straight ahead. You may rest your hands on the table for balance but not for support. 2. Slowly bend your knees and lower your hips like you are going to sit in a chair. ? Keep your weight over your heels, not over your toes. ? Keep your lower legs upright so they are parallel with the table legs. ? Do not let your hips go lower than your knees. ? Do not bend lower than told by your health care provider. ? If your knee pain increases, do not bend as low. 3. Hold the squat position for __________ seconds. 4. Slowly push with your legs to return to standing. Do not use your hands to pull yourself to standing. Repeat __________ times. Complete this exercise __________ times a day. Wall slides This exercise strengthens the muscles in front of your thigh and knee (quadriceps). 1. Lean your back against a smooth wall or door, and walk your feet out 18-24 inches (46-61 cm) from it.  2. Place your feet hip-width apart. 3. Slowly slide down the wall or door until your knees bend __________ degrees. Keep your knees over your heels, not over your toes. Keep your knees in line with your hips. 4. Hold this position for __________ seconds. Repeat __________ times. Complete this exercise __________ times a day.   Straight leg raises This exercise strengthens the muscles that rotate the leg at the hip and move it away from your body (hip abductors). 1. Lie on your side with your left / right leg in the top position. Lie so your head, shoulder, knee, and hip line up. You may bend your bottom knee to help you keep your balance. 2. Roll your hips slightly forward so your hips are stacked directly over each other and your left / right knee is facing forward. 3. Leading with your heel, lift your top leg 4-6 inches (10-15 cm). You should feel the muscles in your outer hip lifting. ? Do not let your foot drift forward. ? Do not let your knee roll toward the ceiling. 4. Hold this position for  __________ seconds. 5. Slowly return your leg to the starting position. 6. Let your muscles relax completely after each repetition. Repeat __________ times. Complete this exercise __________ times a day.   Straight leg raises This exercise stretches the muscles that move your hips away from the front of the pelvis (hip extensors). 1. Lie on your abdomen on a firm surface. You can put a pillow under your hips if that is more comfortable. 2. Tense the muscles in your buttocks and lift your left / right leg about 4-6 inches (10-15 cm). Keep your knee straight as you lift your leg. 3. Hold this position for __________ seconds. 4. Slowly lower your leg to the starting position. 5. Let your leg relax completely after each repetition. Repeat __________ times. Complete this exercise __________ times a day. This information is not intended to replace advice given to you by your health care provider. Make sure you discuss any questions you have with your health care provider. Document Revised: 03/31/2018 Document Reviewed: 03/31/2018 Elsevier Patient Education  2021 [employer].

## 2020-08-17 ENCOUNTER — Ambulatory Visit: Payer: Medicare Other

## 2020-08-18 ENCOUNTER — Telehealth: Payer: Self-pay

## 2020-08-18 ENCOUNTER — Ambulatory Visit
Admission: RE | Admit: 2020-08-18 | Discharge: 2020-08-18 | Disposition: A | Discharge status: Home / Self Care | Payer: Medicare Other | Source: Ambulatory Visit | Attending: Internal Medicine | Admitting: Internal Medicine

## 2020-08-18 ENCOUNTER — Other Ambulatory Visit: Payer: Self-pay

## 2020-08-18 DIAGNOSIS — M79604 Pain in right leg: Secondary | ICD-10-CM | POA: Diagnosis not present

## 2020-08-18 NOTE — Telephone Encounter (Signed)
Caryl Pina with Radiology called to let Dr [place] know that [place] results are in for right leg

## 2020-08-18 NOTE — Telephone Encounter (Signed)
For your information  

## 2020-08-22 ENCOUNTER — Ambulatory Visit: Payer: Medicare Other | Admitting: Neurology

## 2020-08-23 DIAGNOSIS — M25561 Pain in right knee: Secondary | ICD-10-CM | POA: Diagnosis not present

## 2020-08-25 ENCOUNTER — Ambulatory Visit
Admission: RE | Admit: 2020-08-25 | Discharge: 2020-08-25 | Disposition: A | Discharge status: Home / Self Care | Payer: Medicare Other | Source: Ambulatory Visit | Attending: Internal Medicine | Admitting: Internal Medicine

## 2020-08-25 ENCOUNTER — Other Ambulatory Visit: Payer: Self-pay

## 2020-08-25 DIAGNOSIS — M25561 Pain in right knee: Secondary | ICD-10-CM | POA: Diagnosis not present

## 2020-08-26 ENCOUNTER — Encounter: Payer: Self-pay | Admitting: Internal Medicine

## 2020-08-28 ENCOUNTER — Encounter: Payer: Self-pay | Admitting: Internal Medicine

## 2020-08-30 DIAGNOSIS — M1711 Unilateral primary osteoarthritis, right knee: Secondary | ICD-10-CM | POA: Diagnosis not present

## 2020-08-30 DIAGNOSIS — M25561 Pain in right knee: Secondary | ICD-10-CM | POA: Diagnosis not present

## 2020-08-30 DIAGNOSIS — Z01812 Encounter for preprocedural laboratory examination: Secondary | ICD-10-CM | POA: Diagnosis not present

## 2020-08-30 DIAGNOSIS — R791 Abnormal coagulation profile: Secondary | ICD-10-CM | POA: Diagnosis not present

## 2020-09-02 ENCOUNTER — Telehealth: Payer: Self-pay | Admitting: Family Medicine

## 2020-09-02 NOTE — Telephone Encounter (Signed)
I received a surgical clearance for this patient.  Please get her scheduled for a visit.  Her tentative surgery date is 09/28/2020.  Please try to get her in in the next couple of weeks.  Thanks.

## 2020-09-04 ENCOUNTER — Other Ambulatory Visit: Payer: Self-pay | Admitting: Internal Medicine

## 2020-09-04 DIAGNOSIS — M25561 Pain in right knee: Secondary | ICD-10-CM

## 2020-09-04 DIAGNOSIS — M160 Bilateral primary osteoarthritis of hip: Secondary | ICD-10-CM

## 2020-09-04 MED ORDER — HYDROCODONE-ACETAMINOPHEN 5-325 MG PO TABS: 1.0000 | ORAL_TABLET | Freq: Two times a day (BID) | ORAL | 0 refills | Status: DC | PRN

## 2020-09-04 NOTE — Telephone Encounter (Signed)
Pt states that she has been crying due to the pain in knee. She states that she is only taking Tylenol but it is not touching the pain. She wants to ask PCP if she may please have something stronger just to help her until surgery. Please advise asap.  Margarie Mcguirt,cma

## 2020-09-04 NOTE — Telephone Encounter (Signed)
Please contact the patient and encourage her to reach out to Dr Mardelle Matte for pain control options. If he does not provide any she should let us know.

## 2020-09-04 NOTE — Telephone Encounter (Signed)
Has she discussed pain medication with her orthopedic physician? They would be a good resource to reach out to for pain medication for this issue. I will forward to Dr [facility] as she saw the patient for this issue to determine if she will consider a refill of the previously prescribed pain medication.

## 2020-09-04 NOTE — Telephone Encounter (Signed)
I saw pt back in 07/2020 for this issue and will release only 10 more pain pills  This is orthopedic issue and has ortho Dr. Mardelle Matte for pain control  as well as PCP can follow up with patient

## 2020-09-04 NOTE — Telephone Encounter (Signed)
Pt states that she has been crying due to the pain in knee. She states that she is only taking Tylenol but it is not touching the pain. She wants to ask PCP if she may please have something stronger just to help her until surgery. Please advise asap

## 2020-09-04 NOTE — Telephone Encounter (Signed)
That is fine 

## 2020-09-04 NOTE — Telephone Encounter (Signed)
I called and the patient is scheduled for surgical clearance, I had to put her in the hospital f/up slot due to you not having any other openings by her surgery date.  Nyoka Alcoser,cma

## 2020-09-05 NOTE — Telephone Encounter (Signed)
I called and spoke with the patient and she stated she would reach out to Dr. Mardelle Matte.  Jaran Sainz,cma

## 2020-09-06 ENCOUNTER — Other Ambulatory Visit: Payer: Self-pay | Admitting: Family Medicine

## 2020-09-13 ENCOUNTER — Other Ambulatory Visit: Payer: Medicare Other

## 2020-09-13 DIAGNOSIS — M1711 Unilateral primary osteoarthritis, right knee: Secondary | ICD-10-CM | POA: Diagnosis not present

## 2020-09-14 ENCOUNTER — Other Ambulatory Visit: Payer: Self-pay

## 2020-09-18 ENCOUNTER — Ambulatory Visit (INDEPENDENT_AMBULATORY_CARE_PROVIDER_SITE_OTHER): Payer: Medicare Other | Admitting: Family Medicine

## 2020-09-18 ENCOUNTER — Encounter: Payer: Self-pay | Admitting: Family Medicine

## 2020-09-18 ENCOUNTER — Other Ambulatory Visit: Payer: Self-pay

## 2020-09-18 DIAGNOSIS — Z01818 Encounter for other preprocedural examination: Secondary | ICD-10-CM | POA: Diagnosis not present

## 2020-09-18 NOTE — Progress Notes (Signed)
Tommi Rumps, MD Phone: [phone]  Pam Salazar is a 75 y.o. female who presents today for preop evaluation.   Pt is a 75 y.o. female who is here for preoperative clearance for right total knee arthroplasty.  1) High Risk Cardiac Conditions  1) Recent MI - No.  2) Decompensated Heart Failure - No.  3) unstable angina - No.  4) Symptomatic arrythmia - No.  5) Sx Valvular Disease - No.  2) Intermediate Risk Factors - DM, CKD, CVA, CHF, CAD - No.  2) Functional Status - > 4 mets ( Walk, run, climb stairs ) Yes.    3) Surgery Specific Risk - Intermediate (Orthopaedic )         4) Further Noninvasive evaluation -   1) EKG - No.  2) Echo - No.  3) Stress Testing - No.  5) Need for medical therapy - already on statin  Reports no history of personal or family history with anesthesia.  Does have a history of PVCs in college though has not had any symptoms since then.  No history of seizures.  No history of asthma or bronchitis.  Patient is managing pain with Tylenol.  She does have Vicodin on hand to take as needed.  Social History   Tobacco Use  Smoking Status Former Smoker  . Types: Cigarettes  Smokeless Tobacco Former Systems developer  . Quit date: 07/26/2013  Tobacco Comment        Current Outpatient Medications on File Prior to Visit  Medication Sig Dispense Refill  . clonazePAM (KLONOPIN) 1 MG tablet TAKE 1 TABLET(1 MG) BY MOUTH TWICE DAILY AS NEEDED FOR ANXIETY 60 tablet 1  . diphenhydramine-acetaminophen (TYLENOL PM) 25-500 MG TABS tablet Take 1 tablet by mouth at bedtime.    Marland Kitchen HYDROcodone-acetaminophen (NORCO/VICODIN) 5-325 MG tablet Take 1 tablet by mouth 2 (two) times daily as needed for moderate pain. 10 tablet 0  . levothyroxine (SYNTHROID) 125 MCG tablet TAKE 1 TABLET(125 MCG) BY MOUTH DAILY BEFORE BREAKFAST 90 tablet 1  . Melatonin 3 MG CAPS Take 1 capsule by mouth at bedtime as needed.    . meloxicam (MOBIC) 15 MG tablet TAKE 1 TABLET(15 MG) BY MOUTH DAILY AS  NEEDED FOR PAIN 90 tablet 0  . Multiple Vitamins-Minerals (PRESERVISION AREDS 2 PO) Take 1 capsule by mouth 2 (two) times daily.     . sertraline (ZOLOFT) 100 MG tablet TAKE 2 TABLETS(200 MG) BY MOUTH DAILY 180 tablet 1  . simvastatin (ZOCOR) 20 MG tablet TAKE 1 TABLET BY MOUTH EVERY EVENING 90 tablet 1   No current facility-administered medications on file prior to visit.     ROS see history of present illness  Objective  Physical Exam Vitals:   09/18/20 1137  BP: 100/60  Pulse: (!) 52  Temp: (!) 97.4 F (36.3 C)  SpO2: 98%    BP Readings from Last 3 Encounters:  09/18/20 100/60  08/10/20 110/70  06/05/20 90/65   Wt Readings from Last 3 Encounters:  09/18/20 123 lb 9.6 oz (56.1 kg)  08/10/20 126 lb (57.2 kg)  06/05/20 126 lb 3.2 oz (57.2 kg)    Physical Exam Constitutional:      General: She is not in acute distress.    Appearance: She is not diaphoretic.  Cardiovascular:     Rate and Rhythm: Normal rate and regular rhythm.     Heart sounds: Normal heart sounds.  Pulmonary:     Effort: Pulmonary effort is normal.     Breath  sounds: Normal breath sounds.  Abdominal:     General: Bowel sounds are normal. There is no distension.     Palpations: Abdomen is soft.     Tenderness: There is no abdominal tenderness. There is no guarding or rebound.  Musculoskeletal:     Right lower leg: No edema.     Left lower leg: No edema.  Skin:    General: Skin is warm and dry.  Neurological:     Mental Status: She is alert.      Assessment/Plan: Please see individual problem list.  Problem List Items Addressed This Visit    Preoperative examination    Preoperative evaluation completed today.  Patient is low risk for cardiovascular event.  She is having a spinal block as her anesthesia.  She had labs done through her orthopedist office and we will request those records.  Once those are reviewed I will fill out her paperwork for her to have surgery.          This  visit occurred during the SARS-CoV-2 public health emergency.  Safety protocols were in place, including screening questions prior to the visit, additional usage of staff PPE, and extensive cleaning of exam room while observing appropriate contact time as indicated for disinfecting solutions.    Tommi Rumps, MD [place]

## 2020-09-18 NOTE — Patient Instructions (Signed)
Nice to see you. Good luck with your surgery. We will look at your labs and let you know if you need anything additionally before your surgery.

## 2020-09-18 NOTE — Assessment & Plan Note (Signed)
Preoperative evaluation completed today.  Patient is low risk for cardiovascular event.  She is having a spinal block as her anesthesia.  She had labs done through her orthopedist office and we will request those records.  Once those are reviewed I will fill out her paperwork for her to have surgery.

## 2020-09-20 ENCOUNTER — Other Ambulatory Visit: Payer: Self-pay | Admitting: Family Medicine

## 2020-09-20 ENCOUNTER — Telehealth: Payer: Self-pay

## 2020-09-20 NOTE — Telephone Encounter (Signed)
Sherry with [facility] called and needs surgical clearance notes from pre op appt with PCP on 09/18/20. Please fax to [phone]. If you need to call her-her direct number is [phone]

## 2020-09-20 NOTE — Telephone Encounter (Signed)
I called and lvm for the surgical team to fax the patients labs over and we will sign the form and fax it to them.  Meredith Mells,cma

## 2020-09-21 NOTE — Telephone Encounter (Signed)
Labs for the patient was received from [facility] and attached to form and placed in sign basket.  Leomia Blake,cma

## 2020-09-22 NOTE — Telephone Encounter (Signed)
Reviewed.  Clearance signed.  Please fax.  Thanks.

## 2020-09-22 NOTE — Telephone Encounter (Signed)
Paper work has been faxed

## 2020-09-25 ENCOUNTER — Telehealth: Payer: Self-pay | Admitting: Family Medicine

## 2020-09-25 NOTE — Telephone Encounter (Signed)
What nightly supplement is she referring to? I would not recommend taking an extra klonopin for sleep.

## 2020-09-25 NOTE — Telephone Encounter (Signed)
Pt called she was taken off her nightly supplements that help her sleep because she is having surgery on 4/8. She wanted to know if she could take an extra clonazePAM (KLONOPIN) 1 MG tablet to help her sleep

## 2020-09-25 NOTE — Telephone Encounter (Signed)
I called and LVM informing the patient that the provider does not recommend that she take an extra Klonopin for sleep and she would call back with questions.  Addalynne Golding,cma

## 2020-09-28 DIAGNOSIS — M1711 Unilateral primary osteoarthritis, right knee: Secondary | ICD-10-CM | POA: Diagnosis not present

## 2020-09-28 DIAGNOSIS — G8918 Other acute postprocedural pain: Secondary | ICD-10-CM | POA: Diagnosis not present

## 2020-09-28 HISTORY — PX: REPLACEMENT TOTAL KNEE: SUR1224

## 2020-09-29 ENCOUNTER — Other Ambulatory Visit: Payer: Self-pay

## 2020-09-29 DIAGNOSIS — Z87891 Personal history of nicotine dependence: Secondary | ICD-10-CM | POA: Diagnosis not present

## 2020-09-29 DIAGNOSIS — Z96651 Presence of right artificial knee joint: Secondary | ICD-10-CM | POA: Diagnosis not present

## 2020-09-29 DIAGNOSIS — E7800 Pure hypercholesterolemia, unspecified: Secondary | ICD-10-CM | POA: Diagnosis not present

## 2020-09-29 DIAGNOSIS — F410 Panic disorder [episodic paroxysmal anxiety] without agoraphobia: Secondary | ICD-10-CM | POA: Diagnosis not present

## 2020-09-29 DIAGNOSIS — Z471 Aftercare following joint replacement surgery: Secondary | ICD-10-CM | POA: Diagnosis not present

## 2020-09-29 DIAGNOSIS — E039 Hypothyroidism, unspecified: Secondary | ICD-10-CM | POA: Diagnosis not present

## 2020-09-29 DIAGNOSIS — F32A Depression, unspecified: Secondary | ICD-10-CM | POA: Diagnosis not present

## 2020-09-29 DIAGNOSIS — H3530 Unspecified macular degeneration: Secondary | ICD-10-CM | POA: Diagnosis not present

## 2020-09-29 MED ORDER — CLONAZEPAM 1 MG PO TABS
ORAL_TABLET | ORAL | 1 refills | Status: DC
Start: 2020-09-29 — End: 2020-11-29

## 2020-09-30 ENCOUNTER — Other Ambulatory Visit: Payer: Self-pay | Admitting: Family Medicine

## 2020-10-03 DIAGNOSIS — E039 Hypothyroidism, unspecified: Secondary | ICD-10-CM | POA: Diagnosis not present

## 2020-10-03 DIAGNOSIS — E7800 Pure hypercholesterolemia, unspecified: Secondary | ICD-10-CM | POA: Diagnosis not present

## 2020-10-03 DIAGNOSIS — F32A Depression, unspecified: Secondary | ICD-10-CM | POA: Diagnosis not present

## 2020-10-03 DIAGNOSIS — F410 Panic disorder [episodic paroxysmal anxiety] without agoraphobia: Secondary | ICD-10-CM | POA: Diagnosis not present

## 2020-10-03 DIAGNOSIS — H3530 Unspecified macular degeneration: Secondary | ICD-10-CM | POA: Diagnosis not present

## 2020-10-03 DIAGNOSIS — Z471 Aftercare following joint replacement surgery: Secondary | ICD-10-CM | POA: Diagnosis not present

## 2020-10-06 DIAGNOSIS — E7800 Pure hypercholesterolemia, unspecified: Secondary | ICD-10-CM | POA: Diagnosis not present

## 2020-10-06 DIAGNOSIS — Z471 Aftercare following joint replacement surgery: Secondary | ICD-10-CM | POA: Diagnosis not present

## 2020-10-06 DIAGNOSIS — F410 Panic disorder [episodic paroxysmal anxiety] without agoraphobia: Secondary | ICD-10-CM | POA: Diagnosis not present

## 2020-10-06 DIAGNOSIS — F32A Depression, unspecified: Secondary | ICD-10-CM | POA: Diagnosis not present

## 2020-10-06 DIAGNOSIS — E039 Hypothyroidism, unspecified: Secondary | ICD-10-CM | POA: Diagnosis not present

## 2020-10-06 DIAGNOSIS — H3530 Unspecified macular degeneration: Secondary | ICD-10-CM | POA: Diagnosis not present

## 2020-10-07 DIAGNOSIS — E7800 Pure hypercholesterolemia, unspecified: Secondary | ICD-10-CM | POA: Diagnosis not present

## 2020-10-07 DIAGNOSIS — E039 Hypothyroidism, unspecified: Secondary | ICD-10-CM | POA: Diagnosis not present

## 2020-10-07 DIAGNOSIS — H3530 Unspecified macular degeneration: Secondary | ICD-10-CM | POA: Diagnosis not present

## 2020-10-07 DIAGNOSIS — F410 Panic disorder [episodic paroxysmal anxiety] without agoraphobia: Secondary | ICD-10-CM | POA: Diagnosis not present

## 2020-10-07 DIAGNOSIS — F32A Depression, unspecified: Secondary | ICD-10-CM | POA: Diagnosis not present

## 2020-10-07 DIAGNOSIS — Z471 Aftercare following joint replacement surgery: Secondary | ICD-10-CM | POA: Diagnosis not present

## 2020-10-09 DIAGNOSIS — F410 Panic disorder [episodic paroxysmal anxiety] without agoraphobia: Secondary | ICD-10-CM | POA: Diagnosis not present

## 2020-10-09 DIAGNOSIS — E039 Hypothyroidism, unspecified: Secondary | ICD-10-CM | POA: Diagnosis not present

## 2020-10-09 DIAGNOSIS — Z471 Aftercare following joint replacement surgery: Secondary | ICD-10-CM | POA: Diagnosis not present

## 2020-10-09 DIAGNOSIS — F32A Depression, unspecified: Secondary | ICD-10-CM | POA: Diagnosis not present

## 2020-10-09 DIAGNOSIS — E7800 Pure hypercholesterolemia, unspecified: Secondary | ICD-10-CM | POA: Diagnosis not present

## 2020-10-09 DIAGNOSIS — H3530 Unspecified macular degeneration: Secondary | ICD-10-CM | POA: Diagnosis not present

## 2020-10-11 DIAGNOSIS — M1711 Unilateral primary osteoarthritis, right knee: Secondary | ICD-10-CM | POA: Diagnosis not present

## 2020-10-12 DIAGNOSIS — M25561 Pain in right knee: Secondary | ICD-10-CM | POA: Diagnosis not present

## 2020-10-12 DIAGNOSIS — R2689 Other abnormalities of gait and mobility: Secondary | ICD-10-CM | POA: Diagnosis not present

## 2020-10-18 DIAGNOSIS — M25561 Pain in right knee: Secondary | ICD-10-CM | POA: Diagnosis not present

## 2020-10-18 DIAGNOSIS — R2689 Other abnormalities of gait and mobility: Secondary | ICD-10-CM | POA: Diagnosis not present

## 2020-10-20 DIAGNOSIS — M25561 Pain in right knee: Secondary | ICD-10-CM | POA: Diagnosis not present

## 2020-10-20 DIAGNOSIS — R2689 Other abnormalities of gait and mobility: Secondary | ICD-10-CM | POA: Diagnosis not present

## 2020-10-23 DIAGNOSIS — R2689 Other abnormalities of gait and mobility: Secondary | ICD-10-CM | POA: Diagnosis not present

## 2020-10-23 DIAGNOSIS — M25561 Pain in right knee: Secondary | ICD-10-CM | POA: Diagnosis not present

## 2020-10-24 ENCOUNTER — Other Ambulatory Visit: Payer: Self-pay | Admitting: Family Medicine

## 2020-10-26 DIAGNOSIS — M25561 Pain in right knee: Secondary | ICD-10-CM | POA: Diagnosis not present

## 2020-10-26 DIAGNOSIS — R2689 Other abnormalities of gait and mobility: Secondary | ICD-10-CM | POA: Diagnosis not present

## 2020-10-31 DIAGNOSIS — M25561 Pain in right knee: Secondary | ICD-10-CM | POA: Diagnosis not present

## 2020-10-31 DIAGNOSIS — R2689 Other abnormalities of gait and mobility: Secondary | ICD-10-CM | POA: Diagnosis not present

## 2020-11-01 ENCOUNTER — Encounter: Payer: Self-pay | Admitting: Family Medicine

## 2020-11-03 DIAGNOSIS — M25561 Pain in right knee: Secondary | ICD-10-CM | POA: Diagnosis not present

## 2020-11-03 DIAGNOSIS — R2689 Other abnormalities of gait and mobility: Secondary | ICD-10-CM | POA: Diagnosis not present

## 2020-11-06 DIAGNOSIS — M25561 Pain in right knee: Secondary | ICD-10-CM | POA: Diagnosis not present

## 2020-11-06 DIAGNOSIS — R2689 Other abnormalities of gait and mobility: Secondary | ICD-10-CM | POA: Diagnosis not present

## 2020-11-08 DIAGNOSIS — M1711 Unilateral primary osteoarthritis, right knee: Secondary | ICD-10-CM | POA: Diagnosis not present

## 2020-11-09 DIAGNOSIS — M25561 Pain in right knee: Secondary | ICD-10-CM | POA: Diagnosis not present

## 2020-11-09 DIAGNOSIS — R2689 Other abnormalities of gait and mobility: Secondary | ICD-10-CM | POA: Diagnosis not present

## 2020-11-13 ENCOUNTER — Telehealth: Payer: Self-pay | Admitting: Family Medicine

## 2020-11-13 NOTE — Telephone Encounter (Signed)
Patient called in wanted to know if it was ok to have the second booster now that she has had her knee replacement

## 2020-11-14 NOTE — Telephone Encounter (Signed)
She can have the second booster vaccine based on it being greater than 4 months since her first booster vaccine.  I would recommend proceeding with the second COVID booster vaccine.

## 2020-11-14 NOTE — Telephone Encounter (Signed)
Patient called back and I informed her that as long as its been 4 months since her 1st booster she is okay to get a second booster.  Patient stated she would look at her card and precede.  Rashonda Warrior,cma

## 2020-11-14 NOTE — Telephone Encounter (Signed)
I called and LVM for the patient to call back. Silvia Hightower,cma   

## 2020-11-15 DIAGNOSIS — R2689 Other abnormalities of gait and mobility: Secondary | ICD-10-CM | POA: Diagnosis not present

## 2020-11-15 DIAGNOSIS — M25561 Pain in right knee: Secondary | ICD-10-CM | POA: Diagnosis not present

## 2020-11-22 DIAGNOSIS — M25561 Pain in right knee: Secondary | ICD-10-CM | POA: Diagnosis not present

## 2020-11-22 DIAGNOSIS — R2689 Other abnormalities of gait and mobility: Secondary | ICD-10-CM | POA: Diagnosis not present

## 2020-11-29 ENCOUNTER — Other Ambulatory Visit: Payer: Self-pay | Admitting: Family Medicine

## 2020-11-29 DIAGNOSIS — R2689 Other abnormalities of gait and mobility: Secondary | ICD-10-CM | POA: Diagnosis not present

## 2020-11-29 DIAGNOSIS — M25561 Pain in right knee: Secondary | ICD-10-CM | POA: Diagnosis not present

## 2020-11-29 NOTE — Telephone Encounter (Signed)
RX Refill:klonopin Last Seen:09-18-20 Last ordered:09-29-20

## 2020-11-30 DIAGNOSIS — Z23 Encounter for immunization: Secondary | ICD-10-CM | POA: Diagnosis not present

## 2020-12-07 ENCOUNTER — Encounter: Payer: Self-pay | Admitting: Family Medicine

## 2020-12-08 ENCOUNTER — Other Ambulatory Visit: Payer: Self-pay

## 2020-12-08 ENCOUNTER — Encounter: Payer: Self-pay | Admitting: Family Medicine

## 2020-12-08 ENCOUNTER — Ambulatory Visit (INDEPENDENT_AMBULATORY_CARE_PROVIDER_SITE_OTHER): Payer: Medicare Other | Admitting: Family Medicine

## 2020-12-08 DIAGNOSIS — H8111 Benign paroxysmal vertigo, right ear: Secondary | ICD-10-CM | POA: Insufficient documentation

## 2020-12-08 MED ORDER — MECLIZINE HCL 12.5 MG PO TABS: 12.5000 mg | ORAL_TABLET | Freq: Three times a day (TID) | ORAL | 0 refills | Status: DC | PRN

## 2020-12-08 NOTE — Assessment & Plan Note (Signed)
The patient has vertigo related to right-sided BPPV.  Dix-Hallpike positive.  We will give her modified Epley maneuver to complete for the right side.  I discussed this exercise with the patient.  She can also use meclizine to help with dizziness.  Discussed risk of drowsiness with this.  I advised her not to drive until the vertiginous symptoms have resolved.  Discussed we could refer her for vestibular rehab if her symptoms are not improving over the next several days to a week.  Discussed seeking medical attention immediately if she develops persistent vertigo.

## 2020-12-08 NOTE — Patient Instructions (Signed)
Benign Positional Vertigo Vertigo is the feeling that you or your surroundings are moving when they are not. Benign positional vertigo is the most common form of vertigo. This is usually a harmless condition (benign). This condition is positional. This means that symptoms are triggered bycertain movements and positions. This condition can be dangerous if it occurs while you are doing something that could cause harm to yourself or others. This includes activities such asdriving or operating machinery. What are the causes? The inner ear has fluid-filled canals that help your brain sense movement and balance. When the fluid moves, the brain receives messages about your body'sposition. With benign positional vertigo, calcium crystals in the inner ear break free and disturb the inner ear area. This causes your brain to receive confusingmessages about your body's position. What increases the risk? You are more likely to develop this condition if: You are a woman. You are 31 years of age or older. You have recently had a head injury. You have an inner ear disease. What are the signs or symptoms? Symptoms of this condition usually happen when you move your head or your eyes in different directions. Symptoms may start suddenly and usually last for less than a minute. They include: Loss of balance and falling. Feeling like you are spinning or moving. Feeling like your surroundings are spinning or moving. Nausea and vomiting. Blurred vision. Dizziness. Involuntary eye movement (nystagmus). Symptoms can be mild and cause only minor problems, or they can be severe and interfere with daily life. Episodes of benign positional vertigo may return (recur) over time. Symptoms may also improve over time. How is this diagnosed? This condition may be diagnosed based on: Your medical history. A physical exam of the head, neck, and ears. Positional tests to check for or stimulate vertigo. You may be asked to turn  your head and change positions, such as going from sitting to lying down. A health care provider will watch for symptoms of vertigo. You may be referred to a health care provider who specializes in ear, nose, and throat problems (ENT or otolaryngologist) or a provider who specializes in disorders of the nervous system (neurologist). How is this treated?  This condition may be treated in a session in which your health care provider moves your head in specific positions to help the displaced crystals in your inner ear move. Treatment for this condition may take several sessions. Surgerymay be needed in severe cases, but this is rare. In some cases, benign positional vertigo may resolve on its own in 2-4 weeks. Follow these instructions at home: Safety Move slowly. Avoid sudden body or head movements or certain positions, as told by your health care provider. Avoid driving or operating machinery until your health care provider says it is safe. Avoid doing any tasks that would be dangerous to you or others if vertigo occurs. If you have trouble walking or keeping your balance, try using a cane for stability. If you feel dizzy or unstable, sit down right away. Return to your normal activities as told by your health care provider. Ask your health care provider what activities are safe for you. General instructions Take over-the-counter and prescription medicines only as told by your health care provider. Drink enough fluid to keep your urine pale yellow. Keep all follow-up visits. This is important. Contact a health care provider if: You have a fever. Your condition gets worse or you develop new symptoms. Your family or friends notice any behavioral changes. You have nausea or vomiting  that gets worse. You have numbness or a prickling and tingling sensation. Get help right away if you: Have difficulty speaking or moving. Are always dizzy or faint. Develop severe headaches. Have weakness in your  legs or arms. Have changes in your hearing or vision. Develop a stiff neck. Develop sensitivity to light. These symptoms may represent a serious problem that is an emergency. Do not wait to see if the symptoms will go away. Get medical help right away. Call your local emergency services (911 in the [place] Do not drive yourself to the hospital. Summary Vertigo is the feeling that you or your surroundings are moving when they are not. Benign positional vertigo is the most common form of vertigo. This condition is caused by calcium crystals in the inner ear that become displaced. This causes a disturbance in an area of the inner ear that helps your brain sense movement and balance. Symptoms include loss of balance and falling, feeling that you or your surroundings are moving, nausea and vomiting, and blurred vision. This condition can be diagnosed based on symptoms, a physical exam, and positional tests. Follow safety instructions as told by your health care provider and keep all follow-up visits. This is important. This information is not intended to replace advice given to you by your health care provider. Make sure you discuss any questions you have with your healthcare provider. Document Revised: 05/10/2020 Document Reviewed: 05/10/2020 Elsevier Patient Education  2022 [employer].

## 2020-12-08 NOTE — Progress Notes (Signed)
Tommi Rumps, MD Phone: [phone]  Pam Salazar is a 75 y.o. female who presents today for same-day visit.  Dizziness: Patient notes she has been having some dizziness over the last several weeks that got worse 2 to 3 days ago with vertiginous symptoms.  She feels as though the room is spinning around her.  She notes occasional lightheadedness with this.  No tinnitus.  No ear fullness.  No change to her hearing.  She notes it got worse yesterday and she had a fall back in the bed.  She notes the symptoms improve within a minute of keeping her head still.  She has long history of vertigo in the past.  She had an MRI in December that was negative for any cause of vertiginous symptoms or balance issues.  Social History   Tobacco Use  Smoking Status Former   Pack years: 0.00   Types: Cigarettes  Smokeless Tobacco Former   Quit date: 07/26/2013  Tobacco Comments        Current Outpatient Medications on File Prior to Visit  Medication Sig Dispense Refill   clonazePAM (KLONOPIN) 1 MG tablet TAKE 1 TABLET(1 MG) BY MOUTH TWICE DAILY AS NEEDED FOR ANXIETY 60 tablet 0   diphenhydramine-acetaminophen (TYLENOL PM) 25-500 MG TABS tablet Take 1 tablet by mouth at bedtime.     levothyroxine (SYNTHROID) 125 MCG tablet TAKE 1 TABLET(125 MCG) BY MOUTH DAILY BEFORE BREAKFAST 90 tablet 1   Melatonin 3 MG CAPS Take 1 capsule by mouth at bedtime as needed.     Multiple Vitamins-Minerals (PRESERVISION AREDS 2 PO) Take 1 capsule by mouth 2 (two) times daily.      sennosides-docusate sodium (SENOKOT-S) 8.6-50 MG tablet Take 2 tablets by mouth daily while taking pain medication     sertraline (ZOLOFT) 100 MG tablet TAKE 2 TABLETS(200 MG) BY MOUTH DAILY 180 tablet 1   simvastatin (ZOCOR) 20 MG tablet TAKE 1 TABLET BY MOUTH EVERY EVENING 90 tablet 1   ZOFRAN 4 MG tablet Take 1 tablet by mouth every 8 hours as needed for nausea and vomiting     aspirin EC 325 MG tablet Take 325 mg by mouth 2 (two) times  daily.     oxyCODONE (OXY IR/ROXICODONE) 5 MG immediate release tablet Take 5 mg by mouth every 4 (four) hours as needed. for pain (Patient not taking: Reported on 12/08/2020)     No current facility-administered medications on file prior to visit.     ROS see history of present illness  Objective  Physical Exam Vitals:   12/08/20 0841  BP: 118/70  Pulse: 85  Temp: 97.9 F (36.6 C)  SpO2: 99%    BP Readings from Last 3 Encounters:  12/08/20 118/70  09/18/20 100/60  08/10/20 110/70   Wt Readings from Last 3 Encounters:  12/08/20 123 lb (55.8 kg)  09/18/20 123 lb 9.6 oz (56.1 kg)  08/10/20 126 lb (57.2 kg)    Physical Exam Constitutional:      General: She is not in acute distress.    Appearance: She is not diaphoretic.  HENT:     Right Ear: Tympanic membrane normal.     Left Ear: Tympanic membrane normal.     Ears:     Comments: Positive Dix-Hallpike on the right with vertical nystagmus noted, negative Dix-Hallpike on the left Cardiovascular:     Rate and Rhythm: Normal rate and regular rhythm.     Heart sounds: Normal heart sounds.  Pulmonary:  Effort: Pulmonary effort is normal.     Breath sounds: Normal breath sounds.  Skin:    General: Skin is warm and dry.  Neurological:     Mental Status: She is alert.     Assessment/Plan: Please see individual problem list.  Problem List Items Addressed This Visit     BPPV (benign paroxysmal positional vertigo), right    The patient has vertigo related to right-sided BPPV.  Dix-Hallpike positive.  We will give her modified Epley maneuver to complete for the right side.  I discussed this exercise with the patient.  She can also use meclizine to help with dizziness.  Discussed risk of drowsiness with this.  I advised her not to drive until the vertiginous symptoms have resolved.  Discussed we could refer her for vestibular rehab if her symptoms are not improving over the next several days to a week.  Discussed seeking  medical attention immediately if she develops persistent vertigo.       Relevant Medications   meclizine (ANTIVERT) 12.5 MG tablet    Return if symptoms worsen or fail to improve.  This visit occurred during the SARS-CoV-2 public health emergency.  Safety protocols were in place, including screening questions prior to the visit, additional usage of staff PPE, and extensive cleaning of exam room while observing appropriate contact time as indicated for disinfecting solutions.    Tommi Rumps, MD [place]

## 2020-12-11 DIAGNOSIS — M1711 Unilateral primary osteoarthritis, right knee: Secondary | ICD-10-CM | POA: Diagnosis not present

## 2020-12-20 DIAGNOSIS — D225 Melanocytic nevi of trunk: Secondary | ICD-10-CM | POA: Diagnosis not present

## 2020-12-20 DIAGNOSIS — Z859 Personal history of malignant neoplasm, unspecified: Secondary | ICD-10-CM | POA: Diagnosis not present

## 2020-12-20 DIAGNOSIS — Z872 Personal history of diseases of the skin and subcutaneous tissue: Secondary | ICD-10-CM | POA: Diagnosis not present

## 2020-12-20 DIAGNOSIS — L578 Other skin changes due to chronic exposure to nonionizing radiation: Secondary | ICD-10-CM | POA: Diagnosis not present

## 2020-12-20 DIAGNOSIS — L821 Other seborrheic keratosis: Secondary | ICD-10-CM | POA: Diagnosis not present

## 2020-12-20 DIAGNOSIS — L570 Actinic keratosis: Secondary | ICD-10-CM | POA: Diagnosis not present

## 2020-12-28 ENCOUNTER — Other Ambulatory Visit: Payer: Self-pay | Admitting: Family Medicine

## 2020-12-28 DIAGNOSIS — Z1231 Encounter for screening mammogram for malignant neoplasm of breast: Secondary | ICD-10-CM

## 2020-12-28 NOTE — Telephone Encounter (Signed)
RX Refill:klonopin Last Seen:12-08-20 Last ordered:11-29-20

## 2020-12-29 NOTE — Telephone Encounter (Signed)
Pt called demanding a call back from [facility]. Pt wouldn't tell me what it was about I told her that I had to know in order to send a message back. She told me that it wasn't any of my concern, then she finally told me it was for the refill. She stated that the front never gets the message that she needs to the back in a timely manner.

## 2020-12-29 NOTE — Telephone Encounter (Signed)
Patient has no Klonopin to last until Sunday only . Last Seen:12-08-20 Last ordered:11-29-20

## 2021-01-10 ENCOUNTER — Telehealth: Payer: Self-pay | Admitting: Family Medicine

## 2021-01-10 NOTE — Telephone Encounter (Signed)
Patient called in stating she will need antibiotics for her upcoming dentist visit.She said this is due to having metal and plastic in her knee from the knee replacement in April.She would like to know if Dr.Sonnenberg can give her antibiotics or the dentist.Please advise patient.

## 2021-01-11 NOTE — Telephone Encounter (Signed)
Patient has already received abx from another physician

## 2021-01-11 NOTE — Telephone Encounter (Signed)
She should check with her dentist first to see if they will provide antibiotics.  If not she should let me know.

## 2021-01-23 ENCOUNTER — Telehealth: Payer: Self-pay | Admitting: Family Medicine

## 2021-01-23 NOTE — Telephone Encounter (Signed)
Pt would like all refills to go to [facility] now

## 2021-01-26 ENCOUNTER — Telehealth: Payer: Self-pay | Admitting: Family Medicine

## 2021-01-26 MED ORDER — CLONAZEPAM 1 MG PO TABS: ORAL_TABLET | ORAL | 0 refills | Status: DC

## 2021-01-26 NOTE — Addendum Note (Signed)
Addended by: Caryl Bis, Shafin Pollio G on: 01/26/2021 11:14 AM   Modules accepted: Orders

## 2021-01-26 NOTE — Telephone Encounter (Signed)
Patient is requesting a refill on her clonazePAM (KLONOPIN) 1 MG tablet. Patient will run out of medication on Sunday Evening.

## 2021-01-26 NOTE — Telephone Encounter (Signed)
Refill sent to pharmacy.   

## 2021-01-26 NOTE — Telephone Encounter (Signed)
I called and informed the patient that her refill was sent to the pharmacy and she understood.  Petina Muraski,cma

## 2021-02-01 ENCOUNTER — Ambulatory Visit: Payer: Medicare Other

## 2021-02-01 ENCOUNTER — Ambulatory Visit
Admission: RE | Admit: 2021-02-01 | Discharge: 2021-02-01 | Disposition: A | Discharge status: Home / Self Care | Payer: Medicare Other | Source: Ambulatory Visit | Attending: Family Medicine | Admitting: Family Medicine

## 2021-02-01 ENCOUNTER — Other Ambulatory Visit: Payer: Self-pay

## 2021-02-01 DIAGNOSIS — Z1231 Encounter for screening mammogram for malignant neoplasm of breast: Secondary | ICD-10-CM | POA: Insufficient documentation

## 2021-02-02 ENCOUNTER — Ambulatory Visit: Payer: Medicare Other

## 2021-02-22 ENCOUNTER — Telehealth: Payer: Self-pay

## 2021-02-22 NOTE — Telephone Encounter (Signed)
Pt states that she is having an arthritis flare up and is in between insurances. She asks that Dr Caryl Bis just call in about 10 pills of Meloxicam. She states that she does not need them often but she can not pay full price for a full supply.  Rayelynn Loyal,cma

## 2021-02-22 NOTE — Telephone Encounter (Signed)
Pt states that she is having an arthritis flare up and is in between insurances. She asks that Dr Caryl Bis just call in about 10 pills of Meloxicam. She states that she does not need them often but she can not pay full price for a full supply

## 2021-02-23 MED ORDER — MELOXICAM 7.5 MG PO TABS: 7.5000 mg | ORAL_TABLET | Freq: Every day | ORAL | 0 refills | Status: DC

## 2021-02-23 NOTE — Telephone Encounter (Signed)
I called and LVM for the patient that her medication was sent to the pharmacy. Katalina Magri,cma

## 2021-02-23 NOTE — Telephone Encounter (Signed)
10 tablets of meloxicam sent to pharmacy.

## 2021-02-27 ENCOUNTER — Telehealth: Payer: Self-pay | Admitting: Family Medicine

## 2021-02-27 MED ORDER — LEVOTHYROXINE SODIUM 125 MCG PO TABS: ORAL_TABLET | ORAL | 1 refills | Status: DC

## 2021-02-27 MED ORDER — SIMVASTATIN 20 MG PO TABS: 20.0000 mg | ORAL_TABLET | Freq: Every evening | ORAL | 1 refills | Status: DC

## 2021-02-27 NOTE — Telephone Encounter (Signed)
simvastatin and Levothyroxine have been refilled. Pt has an appointment with PCP scheduled for 03/21/21. Pt questioned the Mobic medication sent in. They state that it was supposed to be '15mg'$  instead of 7.5. Please advise.  Pt requesting a refill of Klonopin. Last refilled on 01/26/21

## 2021-02-27 NOTE — Addendum Note (Signed)
Addended by: Ezequiel Ganser on: 02/27/2021 04:18 PM   Modules accepted: Orders

## 2021-02-27 NOTE — Telephone Encounter (Signed)
Patient has changed pharmacy to [facility], in [place]. Please refill clonazePAM (KLONOPIN) 1 MG tablet and simvastatin (ZOCOR) 20 MG tablet, and levothyroxine (SYNTHROID) 125 MCG tablet. She does need the Klonopin. Patient also stated that when her meloxicam (MOBIC) 7.5 MG tablet was refilled it should have been '15mg'$  . She just wanted the office to know for next refill.

## 2021-02-28 MED ORDER — CLONAZEPAM 1 MG PO TABS: ORAL_TABLET | ORAL | 0 refills | Status: DC

## 2021-02-28 MED ORDER — MELOXICAM 15 MG PO TABS: 15.0000 mg | ORAL_TABLET | Freq: Every day | ORAL | 0 refills | Status: DC

## 2021-02-28 NOTE — Telephone Encounter (Signed)
I sent the 15 mg dose of the meloxicam then.  I also refilled her clonazepam.

## 2021-02-28 NOTE — Telephone Encounter (Signed)
Pt called to check on refill of Klonopin

## 2021-02-28 NOTE — Addendum Note (Signed)
Addended by: Caryl Bis, Orvis Stann G on: 02/28/2021 01:08 PM   Modules accepted: Orders

## 2021-03-07 ENCOUNTER — Other Ambulatory Visit: Payer: Self-pay | Admitting: Family Medicine

## 2021-03-09 ENCOUNTER — Encounter: Payer: Self-pay | Admitting: Family Medicine

## 2021-03-12 DIAGNOSIS — Z23 Encounter for immunization: Secondary | ICD-10-CM | POA: Diagnosis not present

## 2021-03-16 ENCOUNTER — Ambulatory Visit (INDEPENDENT_AMBULATORY_CARE_PROVIDER_SITE_OTHER): Payer: Medicare Other

## 2021-03-16 VITALS — Ht 62.0 in | Wt 123.0 lb

## 2021-03-16 DIAGNOSIS — Z0000 Encounter for general adult medical examination without abnormal findings: Secondary | ICD-10-CM

## 2021-03-16 NOTE — Progress Notes (Signed)
Subjective:   Pam Salazar is a 75 y.o. female who presents for Medicare Annual (Subsequent) preventive examination.  Review of Systems    No ROS.  Medicare Wellness Virtual Visit.  Visual/audio telehealth visit, UTA vital signs.   See social history for additional risk factors.   Cardiac Risk Factors include: advanced age (>63men, >55 women)     Objective:    Today's Vitals   03/16/21 1532  Weight: 123 lb (55.8 kg)  Height: 5\' 2"  (1.575 m)   Body mass index is 22.5 kg/m.  Advanced Directives 03/16/2021 02/01/2020 01/15/2019 01/13/2018 12/22/2017 04/10/2017 07/11/2015  Does Patient Have a Medical Advance Directive? Yes Yes Yes Yes Yes No Yes  Type of Paramedic of [place];Living will [place];Living will [place];Living will Healthcare Power of [place];Living will  Does patient want to make changes to medical advance directive? No - Patient declined No - Patient declined No - Patient declined No - Patient declined - - No - Patient declined  Copy of [place] in Chart? No - copy requested No - copy requested No - copy requested No - copy requested - - No - copy requested    Current Medications (verified) Outpatient Encounter Medications as of 03/16/2021  Medication Sig   clonazePAM (KLONOPIN) 1 MG tablet TAKE 1 TABLET(1 MG) BY MOUTH TWICE DAILY AS NEEDED FOR ANXIETY   diphenhydramine-acetaminophen (TYLENOL PM) 25-500 MG TABS tablet Take 1 tablet by mouth at bedtime.   levothyroxine (SYNTHROID) 125 MCG tablet TAKE 1 TABLET(125 MCG) BY MOUTH DAILY BEFORE BREAKFAST   meclizine (ANTIVERT) 12.5 MG tablet Take 1 tablet (12.5 mg total) by mouth 3 (three) times daily as needed for dizziness.   Melatonin 3 MG CAPS Take 1 capsule by mouth at bedtime as needed.   meloxicam (MOBIC) 15 MG tablet TAKE ONE TABLET BY MOUTH DAILY   Multiple  Vitamins-Minerals (PRESERVISION AREDS 2 PO) Take 1 capsule by mouth 2 (two) times daily.    sertraline (ZOLOFT) 100 MG tablet TAKE 2 TABLETS(200 MG) BY MOUTH DAILY   simvastatin (ZOCOR) 20 MG tablet Take 1 tablet (20 mg total) by mouth every evening.   [DISCONTINUED] aspirin EC 325 MG tablet Take 325 mg by mouth 2 (two) times daily.   [DISCONTINUED] oxyCODONE (OXY IR/ROXICODONE) 5 MG immediate release tablet Take 5 mg by mouth every 4 (four) hours as needed. for pain (Patient not taking: Reported on 12/08/2020)   [DISCONTINUED] sennosides-docusate sodium (SENOKOT-S) 8.6-50 MG tablet Take 2 tablets by mouth daily while taking pain medication   [DISCONTINUED] ZOFRAN 4 MG tablet Take 1 tablet by mouth every 8 hours as needed for nausea and vomiting   No facility-administered encounter medications on file as of 03/16/2021.    Allergies (verified) Duloxetine hcl and Ezetimibe-simvastatin   History: Past Medical History:  Diagnosis Date   Arthritis    hips, lower back   Breast lump 10/21/2011   Overview:  Negative diagnostic mammogram 09/2011    Depression    Fracture of rib of right side 08/17/2013   Hypercholesterolemia    Hypothyroidism    Low blood pressure 10/25/2014   Panic disorder    previous agoraphobia   Seizures (HCC)    febrile - as infant   Shoulder pain, left    bursa   Vertigo    positional - rare   Vitamin D deficiency    Past Surgical History:  Procedure Laterality Date   ABDOMINAL HYSTERECTOMY  1981   partial - secondary to bleeding, ovaries not removed -    CATARACT EXTRACTION W/ INTRAOCULAR LENS  IMPLANT, BILATERAL     COLONOSCOPY WITH PROPOFOL N/A 05/05/2015   Procedure: COLONOSCOPY WITH PROPOFOL;  Surgeon: Lucilla Lame, MD;  Location: [place];  Service: Endoscopy;  Laterality: N/A;   MASS EXCISION Right 12/24/2017   Procedure: EXCISION MASS ( SHIN );  Surgeon: Katha Cabal, MD;  Location: [facility];  Service: Vascular;  Laterality: Right;   ORIF  FIBULA FRACTURE Right 2003   right lower extremitiy   shoulder Left    bone suprs   SHOULDER SURGERY  12/2015   TONSILLECTOMY  1974   Family History  Problem Relation Age of Onset   Arthritis Mother    Hyperlipidemia Mother    Arthritis Father    Prostate cancer Father    Breast cancer Sister 37   Heart disease Brother        heart attack   Diabetes Brother    Breast cancer Maternal Aunt 85   Colon cancer Maternal Uncle    Social History   Socioeconomic History   Marital status: Married    Spouse name: Not on file   Number of children: Not on file   Years of education: Not on file   Highest education level: Not on file  Occupational History   Not on file  Tobacco Use   Smoking status: Former    Types: Cigarettes   Smokeless tobacco: Former    Quit date: 07/26/2013   Tobacco comments:       Vaping Use   Vaping Use: Never used  Substance and Sexual Activity   Alcohol use: No    Alcohol/week: 0.0 standard drinks   Drug use: No   Sexual activity: Yes    Birth control/protection: Post-menopausal  Other Topics Concern   Not on file  Social History Narrative   Former Marine scientist   Social Determinants of Health   Financial Resource Strain: Low Risk    Difficulty of Paying Living Expenses: Not hard at all  Food Insecurity: No Food Insecurity   Worried About Charity fundraiser in the Last Year: Never true   Arboriculturist in the Last Year: Never true  Transportation Needs: No Transportation Needs   Lack of Transportation (Medical): No   Lack of Transportation (Non-Medical): No  Physical Activity: Insufficiently Active   Days of Exercise per Week: 3 days   Minutes of Exercise per Session: 20 min  Stress: No Stress Concern Present   Feeling of Stress : Not at all  Social Connections: Socially Integrated   Frequency of Communication with Friends and Family: More than three times a week   Frequency of Social Gatherings with Friends and Family: More than three times a  week   Attends Religious Services: More than 4 times per year   Active Member of [employer] or Organizations: Yes   Attends Music therapist: More than 4 times per year   Marital Status: Married    Tobacco Counseling Counseling given: Not Answered Tobacco comments:     Clinical Intake:  Pre-visit preparation completed: Yes        Diabetes: No  How often do you need to have someone help you when you read instructions, pamphlets, or other written materials from your doctor or pharmacy?: 1 - Never   Interpreter Needed?: No      Activities  of Daily Living In your present state of health, do you have any difficulty performing the following activities: 03/16/2021  Hearing? N  Vision? N  Difficulty concentrating or making decisions? N  Walking or climbing stairs? N  Dressing or bathing? N  Doing errands, shopping? N  Preparing Food and eating ? N  Using the Toilet? N  In the past six months, have you accidently leaked urine? Y  Comment Managed with daily pad/liner  Do you have problems with loss of bowel control? N  Managing your Medications? N  Managing your Finances? N  Housekeeping or managing your Housekeeping? N  Some recent data might be hidden   Patient Care Team: Leone Haven, MD as PCP - General (Family Medicine) Christene Lye, MD as Consulting Physician (General Surgery) Einar Pheasant, MD (Internal Medicine)  Indicate any recent Medical Services you may have received from other than [facility] providers in the past year (date may be approximate).     Assessment:   This is a routine wellness examination for Arnetha.  I connected with Artis today by telephone and verified that I am speaking with the correct person using two identifiers. Location patient: home Location provider: work Persons participating in the virtual visit: patient, Marine scientist.    I discussed the limitations, risks, security and privacy concerns of performing an evaluation and  management service by telephone and the availability of in person appointments. The patient expressed understanding and verbally consented to this telephonic visit.    Interactive audio and video telecommunications were attempted between this provider and patient, however failed, due to patient having technical difficulties OR patient did not have access to video capability.  We continued and completed visit with audio only.  Some vital signs may be absent or patient reported.   Hearing/Vision screen Hearing Screening - Comments:: Patient has difficulty hearing television programs. Level of difficulty varies when having conversation with others. Audiologic testing deferred in the last year per patient preference. Plans to pursue in the future.  Vision Screening - Comments:: Followed by [facility]  Wears corrective lenses  Annual visits  Age related macular degeneration; taking areds 2 daily  Cataract extraction, bilateral  They have regular follow up with the ophthalmologist  Dietary issues and exercise activities discussed:   Regular diet   Goals Addressed               This Visit's Progress     Patient Stated     Eat less sugar (pt-stated)        Increase physical activity (pt-stated)        Increase walking more for exercise        Depression Screen PHQ 2/9 Scores 03/16/2021 08/10/2020 06/05/2020 02/01/2020 11/08/2019 08/25/2019 01/15/2019  PHQ - 2 Score 0 0 0 0 3 0 1  PHQ- 9 Score - 4 - - 15 - -    Fall Risk Fall Risk  03/16/2021 09/18/2020 06/05/2020 02/01/2020 11/08/2019  Falls in the past year? 1 0 0 0 0  Comment - - - - -  Number falls in past yr: 0 0 0 - -  Injury with Fall? 0 - - - -  Comment Pulled when walking her dog and fell on knee. Reports no medical follow up needed. - - - -  Risk for fall due to : - - - - -  Follow up Falls evaluation completed Falls evaluation completed Falls evaluation completed - Falls evaluation completed   [place]  PERTAINING TO THE HOME: Adequate lighting in your home to reduce risk of falls? Yes   ASSISTIVE DEVICES UTILIZED TO PREVENT FALLS: Use of a cane, walker or w/c? No   TIMED UP AND GO: Was the test performed? No .   Cognitive Function: Patient is alert and oriented x3. MMSE - Mini Mental State Exam 01/13/2018 07/11/2015  Orientation to time 5 5  Orientation to Place 5 5  Registration 3 3  Attention/ Calculation 5 5  Recall 3 3  Language- name 2 objects 2 2  Language- repeat 1 1  Language- follow 3 step command 3 3  Language- read & follow direction 1 1  Write a sentence 1 1  Copy design 1 1  Total score 30 30     6CIT Screen 01/15/2019  What Year? 0 points  What month? 0 points  What time? 0 points  Count back from 20 0 points  Months in reverse 0 points    Immunizations Immunization History  Administered Date(s) Administered   Influenza Split 06/28/2013, 05/27/2014, 04/21/2017   Influenza, High Dose Seasonal PF 05/19/2018, 02/25/2019, 03/12/2021   Influenza-Unspecified 05/25/2015, 05/24/2017, 03/13/2020   PFIZER(Purple Top)SARS-COV-2 Vaccination 08/06/2019, 08/31/2019, 03/13/2020, 11/30/2020, 03/12/2021   Pneumococcal Conjugate-13 08/03/2013   Pneumococcal Polysaccharide-23 09/14/2012, 07/11/2015   Tdap 02/25/2019   Unspecified SARS-COV-2 Vaccination 03/13/2020   Zoster, Live 06/24/2008   Shingrix vaccine-  Due, Education has been provided regarding the importance of this vaccine. Advised may receive this vaccine at local pharmacy or Health Dept. Aware to provide a copy of the vaccination record if obtained from local pharmacy or Health Dept. Verbalized acceptance and understanding. Deferred.   Health Maintenance Health Maintenance  Topic Date Due   Zoster Vaccines- Shingrix (1 of 2) 06/15/2021 (Originally 06/03/1965)   MAMMOGRAM  02/01/2022   COLONOSCOPY (Pts 45-50yrs Insurance coverage will need to be confirmed)  05/04/2025   TETANUS/TDAP  02/24/2029   INFLUENZA  VACCINE  Completed   DEXA SCAN  Completed   COVID-19 Vaccine  Completed   Hepatitis C Screening  Completed   HPV VACCINES  Aged Out   Vision Screening: Recommended annual ophthalmology exams for early detection of glaucoma and other disorders of the eye.  Dental Screening: Recommended annual dental exams for proper oral hygiene  Community Resource Referral / Chronic Care Management: CRR required this visit?  No   CCM required this visit?  No      Plan:   Keep all routine maintenance appointments.   I have personally reviewed and noted the following in the patient's chart:   Medical and social history Use of alcohol, tobacco or illicit drugs  Current medications and supplements including opioid prescriptions. Not taking opioid. Functional ability and status Nutritional status Physical activity Advanced directives List of other physicians Hospitalizations, surgeries, and ER visits in previous 12 months Vitals Screenings to include cognitive, depression, and falls Referrals and appointments  In addition, I have reviewed and discussed with patient certain preventive protocols, quality metrics, and best practice recommendations. A written personalized care plan for preventive services as well as general preventive health recommendations were provided to patient via mychart.     Varney Biles, LPN   3/00/7622

## 2021-03-16 NOTE — Patient Instructions (Addendum)
  Pam Salazar , Thank you for taking time to come for your Medicare Wellness Visit. I appreciate your ongoing commitment to your health goals. Please review the following plan we discussed and let me know if I can assist you in the future.   These are the goals we discussed:  Goals       Patient Stated     Eat less sugar (pt-stated)      Increase physical activity (pt-stated)      Increase walking more for exercise         This is a list of the screening recommended for you and due dates:  Health Maintenance  Topic Date Due   Zoster (Shingles) Vaccine (1 of 2) 06/15/2021*   Mammogram  02/01/2022   Colon Cancer Screening  05/04/2025   Tetanus Vaccine  02/24/2029   Flu Shot  Completed   DEXA scan (bone density measurement)  Completed   COVID-19 Vaccine  Completed   Hepatitis C Screening: USPSTF Recommendation to screen - Ages 28-79 yo.  Completed   HPV Vaccine  Aged Out  *Topic was postponed. The date shown is not the original due date.

## 2021-03-21 ENCOUNTER — Ambulatory Visit (INDEPENDENT_AMBULATORY_CARE_PROVIDER_SITE_OTHER): Payer: Medicare Other | Admitting: Family Medicine

## 2021-03-21 ENCOUNTER — Other Ambulatory Visit: Payer: Self-pay

## 2021-03-21 ENCOUNTER — Encounter: Payer: Self-pay | Admitting: Family Medicine

## 2021-03-21 VITALS — Ht 62.0 in | Wt 123.0 lb

## 2021-03-21 DIAGNOSIS — J069 Acute upper respiratory infection, unspecified: Secondary | ICD-10-CM | POA: Insufficient documentation

## 2021-03-21 DIAGNOSIS — F4321 Adjustment disorder with depressed mood: Secondary | ICD-10-CM | POA: Diagnosis not present

## 2021-03-21 DIAGNOSIS — J3489 Other specified disorders of nose and nasal sinuses: Secondary | ICD-10-CM | POA: Diagnosis not present

## 2021-03-21 DIAGNOSIS — R238 Other skin changes: Secondary | ICD-10-CM | POA: Diagnosis not present

## 2021-03-21 MED ORDER — VALACYCLOVIR HCL 1 G PO TABS: 1000.0000 mg | ORAL_TABLET | Freq: Three times a day (TID) | ORAL | 0 refills | Status: AC

## 2021-03-21 NOTE — Assessment & Plan Note (Signed)
Her symptoms are likely related to a viral illness versus allergies.  Discussed the potential for COVID.  She will have a COVID PCR test done today.  She was advised to quarantine at least until we get the test result back and contact her with the results.  She will monitor her symptoms.

## 2021-03-21 NOTE — Assessment & Plan Note (Signed)
Related to recent loss of her best friend.  I offered support.  Discussed hospice grief counseling if she would like.  She can also talk to her pastor.  She will monitor.

## 2021-03-21 NOTE — Progress Notes (Signed)
Virtual Visit via telephone Note  This visit type was conducted due to national recommendations for restrictions regarding the COVID-19 pandemic (e.g. social distancing).  This format is felt to be most appropriate for this patient at this time.  All issues noted in this document were discussed and addressed.  No physical exam was performed (except for noted visual exam findings with Video Visits).   I connected with Pam Salazar today at 11:00 AM EDT by telephone and verified that I am speaking with the correct person using two identifiers. Location patient: car Location provider: work  Persons participating in the virtual visit: patient, provider  I discussed the limitations, risks, security and privacy concerns of performing an evaluation and management service by telephone and the availability of in person appointments. I also discussed with the patient that there may be a patient responsible charge related to this service. The patient expressed understanding and agreed to proceed.  Interactive audio and video telecommunications were attempted between this provider and patient, however failed, due to patient having technical difficulties OR patient did not have access to video capability.  We continued and completed visit with audio only.   Reason for visit: f/u.  HPI: Upper respiratory infection: Patient notes onset of rhinorrhea, headache, and sore throat yesterday.  She had some chills but did not check her temperature.  She notes no cough or congestion.  She notes difficulty smelling though no loss of taste.  No known COVID exposures though she did go to a small funeral on Saturday.  She has had a lack of energy.  She has been vaccinated x4 for COVID.  Skin sensitivity: This is occurring in her right upper chest.  There is no rash.  Notes it is tender to touch and it started yesterday.  She does have a history of shingles.  Grief: The patient notes the recent funeral she went to was for  her best friend of 76 years.  Her friend had cancer all over the place so  the patient knows she is in a better place.  She notes no depression.  She is on Zoloft and clonazepam for chronic anxiety issues.  She does have a pastor she can reach out to if needed to discuss grief counseling.   ROS: See pertinent positives and negatives per HPI.  Past Medical History:  Diagnosis Date   Arthritis    hips, lower back   Breast lump 10/21/2011   Overview:  Negative diagnostic mammogram 09/2011    Depression    Fracture of rib of right side 08/17/2013   Hypercholesterolemia    Hypothyroidism    Low blood pressure 10/25/2014   Panic disorder    previous agoraphobia   Seizures (HCC)    febrile - as infant   Shoulder pain, left    bursa   Vertigo    positional - rare   Vitamin D deficiency     Past Surgical History:  Procedure Laterality Date   ABDOMINAL HYSTERECTOMY  1981   partial - secondary to bleeding, ovaries not removed -    CATARACT EXTRACTION W/ INTRAOCULAR LENS  IMPLANT, BILATERAL     COLONOSCOPY WITH PROPOFOL N/A 05/05/2015   Procedure: COLONOSCOPY WITH PROPOFOL;  Surgeon: Lucilla Lame, MD;  Location: [place];  Service: Endoscopy;  Laterality: N/A;   MASS EXCISION Right 12/24/2017   Procedure: EXCISION MASS ( SHIN );  Surgeon: Katha Cabal, MD;  Location: [facility];  Service: Vascular;  Laterality: Right;   ORIF  FIBULA FRACTURE Right 2003   right lower extremitiy   shoulder Left    bone suprs   SHOULDER SURGERY  12/2015   TONSILLECTOMY  1974    Family History  Problem Relation Age of Onset   Arthritis Mother    Hyperlipidemia Mother    Arthritis Father    Prostate cancer Father    Breast cancer Sister 31   Heart disease Brother        heart attack   Diabetes Brother    Breast cancer Maternal Aunt 85   Colon cancer Maternal Uncle     SOCIAL HX: former smoker   Current Outpatient Medications:    clonazePAM (KLONOPIN) 1 MG tablet, TAKE 1 TABLET(1 MG)  BY MOUTH TWICE DAILY AS NEEDED FOR ANXIETY, Disp: 60 tablet, Rfl: 0   diphenhydramine-acetaminophen (TYLENOL PM) 25-500 MG TABS tablet, Take 1 tablet by mouth at bedtime., Disp: , Rfl:    levothyroxine (SYNTHROID) 125 MCG tablet, TAKE 1 TABLET(125 MCG) BY MOUTH DAILY BEFORE BREAKFAST, Disp: 90 tablet, Rfl: 1   meclizine (ANTIVERT) 12.5 MG tablet, Take 1 tablet (12.5 mg total) by mouth 3 (three) times daily as needed for dizziness., Disp: 20 tablet, Rfl: 0   Melatonin 3 MG CAPS, Take 1 capsule by mouth at bedtime as needed., Disp: , Rfl:    meloxicam (MOBIC) 15 MG tablet, TAKE ONE TABLET BY MOUTH DAILY, Disp: 10 tablet, Rfl: 0   Multiple Vitamins-Minerals (PRESERVISION AREDS 2 PO), Take 1 capsule by mouth 2 (two) times daily. , Disp: , Rfl:    sertraline (ZOLOFT) 100 MG tablet, TAKE 2 TABLETS(200 MG) BY MOUTH DAILY, Disp: 180 tablet, Rfl: 1   simvastatin (ZOCOR) 20 MG tablet, Take 1 tablet (20 mg total) by mouth every evening., Disp: 90 tablet, Rfl: 1   valACYclovir (VALTREX) 1000 MG tablet, Take 1 tablet (1,000 mg total) by mouth 3 (three) times daily for 7 days., Disp: 21 tablet, Rfl: 0  EXAM: This was a telephone visit and thus no exam was completed.  ASSESSMENT AND PLAN:  Discussed the following assessment and plan:  Problem List Items Addressed This Visit     Grief    Related to recent loss of her best friend.  I offered support.  Discussed hospice grief counseling if she would like.  She can also talk to her pastor.  She will monitor.      Skin sensitivity    Possibly early shingles based on her description.  We will go ahead and start her on Valtrex.  She will monitor for development of rash.  If she has any worsening symptoms she will let us know.      Relevant Medications   valACYclovir (VALTREX) 1000 MG tablet   Viral upper respiratory illness    Her symptoms are likely related to a viral illness versus allergies.  Discussed the potential for COVID.  She will have a COVID PCR  test done today.  She was advised to quarantine at least until we get the test result back and contact her with the results.  She will monitor her symptoms.      Relevant Medications   valACYclovir (VALTREX) 1000 MG tablet   Other Visit Diagnoses     Rhinorrhea    -  Primary   Relevant Orders   Novel Coronavirus, NAA ([facility])       No follow-ups on file.   I discussed the assessment and treatment plan with the patient. The patient was provided an opportunity to  ask questions and all were answered. The patient agreed with the plan and demonstrated an understanding of the instructions.   The patient was advised to call back or seek an in-person evaluation if the symptoms worsen or if the condition fails to improve as anticipated.  I provided 17 minutes of non-face-to-face time during this encounter.   Tommi Rumps, MD

## 2021-03-21 NOTE — Assessment & Plan Note (Signed)
Possibly early shingles based on her description.  We will go ahead and start her on Valtrex.  She will monitor for development of rash.  If she has any worsening symptoms she will let us know.

## 2021-03-22 LAB — SARS-COV-2, NAA 2 DAY TAT

## 2021-03-22 LAB — NOVEL CORONAVIRUS, NAA: SARS-CoV-2, NAA: NOT DETECTED

## 2021-03-28 ENCOUNTER — Other Ambulatory Visit: Payer: Self-pay | Admitting: Family Medicine

## 2021-03-29 ENCOUNTER — Other Ambulatory Visit: Payer: Self-pay | Admitting: Family Medicine

## 2021-03-30 ENCOUNTER — Other Ambulatory Visit: Payer: Self-pay

## 2021-04-11 ENCOUNTER — Telehealth: Payer: Self-pay | Admitting: Family Medicine

## 2021-04-11 ENCOUNTER — Ambulatory Visit: Payer: Medicare Other

## 2021-04-11 NOTE — Telephone Encounter (Signed)
Spoke with Patient advised I could not do an ear lavage without patient being evaluated by a doctor and being DX with impacted cerumen, patient stated she was wanting her ears cleaned prior to going for  a hearing test tomorrow. I again advised I could set her up with  an appointment with provider and then if ear lavage was advised we could do this . Patient stated never mind she would find some else to clean her ears. Advised again I would be glad to schedule her with a provider for evaluation she declined.

## 2021-04-11 NOTE — Telephone Encounter (Signed)
Noted  

## 2021-04-13 DIAGNOSIS — H6123 Impacted cerumen, bilateral: Secondary | ICD-10-CM | POA: Diagnosis not present

## 2021-05-03 ENCOUNTER — Other Ambulatory Visit: Payer: Self-pay | Admitting: Family Medicine

## 2021-05-10 ENCOUNTER — Telehealth: Payer: Self-pay | Admitting: Family Medicine

## 2021-05-10 NOTE — Telephone Encounter (Signed)
This has been off & on. Pt is scheduled tomorrow with Dr. Caryl Bis at 1:15p.

## 2021-05-10 NOTE — Telephone Encounter (Signed)
I called patient & she has had bleeding from the right ear. She has not been able to wear right hearing aid in about two weeks. She does not know if there has been a sore down in her ear that is scratched or what has happened. She did have an NP as [facility] clean right much hard wax build up out of her ear prior to this occurring. She still was unable to wear the hearing aid due to it being uncomfortable. She was advised to see PCP due to bleeding.

## 2021-05-10 NOTE — Telephone Encounter (Signed)
Pt called in regards to having blood clots in her ears. Pt states she needs to be seen due to this because she just received hearing aids and is unable to use one because of her ear.

## 2021-05-10 NOTE — Telephone Encounter (Signed)
She could be scheduled with me or any other available provider to have this evaluated. If she is having active bleeding from her ear I would suggest getting this looked at today.

## 2021-05-11 ENCOUNTER — Ambulatory Visit (INDEPENDENT_AMBULATORY_CARE_PROVIDER_SITE_OTHER): Payer: Medicare Other | Admitting: Family Medicine

## 2021-05-11 ENCOUNTER — Other Ambulatory Visit: Payer: Self-pay

## 2021-05-11 ENCOUNTER — Encounter: Payer: Self-pay | Admitting: Family Medicine

## 2021-05-11 VITALS — BP 99/60 | HR 72 | Temp 98.0°F | Ht 62.0 in | Wt 126.4 lb

## 2021-05-11 DIAGNOSIS — S00411A Abrasion of right ear, initial encounter: Secondary | ICD-10-CM

## 2021-05-11 DIAGNOSIS — S00419A Abrasion of unspecified ear, initial encounter: Secondary | ICD-10-CM | POA: Insufficient documentation

## 2021-05-11 DIAGNOSIS — E039 Hypothyroidism, unspecified: Secondary | ICD-10-CM | POA: Diagnosis not present

## 2021-05-11 DIAGNOSIS — E7800 Pure hypercholesterolemia, unspecified: Secondary | ICD-10-CM

## 2021-05-11 NOTE — Patient Instructions (Signed)
Nice to see you. I suspect the area of concern in your ear canal will heal up over the next several weeks.  We will have you come in briefly for recheck in a few weeks.

## 2021-05-11 NOTE — Progress Notes (Signed)
Tommi Rumps, MD Phone: [phone]  Pam Salazar is a 75 y.o. female who presents today for same-day visit.  Right ear canal abrasion: [facility].  She had it irrigated and was sore afterwards.  She reports there was a lot of cerumen and it was very hard.  She had trouble wearing her hearing aids since then.  She went to the hearing aid place yesterday and they saw evidence of prior bleeding.  The patient notes no pain.  She notes some mild blood on her pillow at times.  Social History   Tobacco Use  Smoking Status Former   Types: Cigarettes  Smokeless Tobacco Former   Quit date: 07/26/2013  Tobacco Comments        Current Outpatient Medications on File Prior to Visit  Medication Sig Dispense Refill   clonazePAM (KLONOPIN) 1 MG tablet TAKE ONE TABLET BY MOUTH TWICE A DAY AS NEEDED FOR ANXEITY 60 tablet 1   diphenhydramine-acetaminophen (TYLENOL PM) 25-500 MG TABS tablet Take 1 tablet by mouth at bedtime.     levothyroxine (SYNTHROID) 125 MCG tablet TAKE 1 TABLET(125 MCG) BY MOUTH DAILY BEFORE BREAKFAST 90 tablet 1   meclizine (ANTIVERT) 12.5 MG tablet Take 1 tablet (12.5 mg total) by mouth 3 (three) times daily as needed for dizziness. 20 tablet 0   Melatonin 3 MG CAPS Take 1 capsule by mouth at bedtime as needed.     meloxicam (MOBIC) 15 MG tablet TAKE ONE TABLET BY MOUTH DAILY 10 tablet 0   Multiple Vitamins-Minerals (PRESERVISION AREDS 2 PO) Take 1 capsule by mouth 2 (two) times daily.      sertraline (ZOLOFT) 100 MG tablet TAKE TWO TABLETS BY MOUTH DAILY 180 tablet 1   simvastatin (ZOCOR) 20 MG tablet Take 1 tablet (20 mg total) by mouth every evening. 90 tablet 1   No current facility-administered medications on file prior to visit.     ROS see history of present illness  Objective  Physical Exam Vitals:   05/11/21 1331  BP: 99/60  Pulse: 72  Temp: 98 F (36.7 C)  SpO2: 99%    BP Readings from  Last 3 Encounters:  05/11/21 99/60  12/08/20 118/70  09/18/20 100/60   Wt Readings from Last 3 Encounters:  05/11/21 126 lb 6.4 oz (57.3 kg)  03/21/21 123 lb (55.8 kg)  03/16/21 123 lb (55.8 kg)    Physical Exam Constitutional:      General: She is not in acute distress.    Appearance: She is not diaphoretic.  HENT:     Ears:     Comments: Posterior right ear canal near the opening with dried blood, no evidence of active bleeding, bilateral TMs appear normal, left ear canal appears normal Neurological:     Mental Status: She is alert.     Assessment/Plan: Please see individual problem list.  Problem List Items Addressed This Visit     Ear canal abrasion    I suspect this is related to the firmness of her cerumen and the prior irrigation.  Discussed that this should heal over the next several weeks.  We will bring her back in 3 weeks for recheck.  If not healed at that time would consider ENT evaluation.      Hypercholesterolemia   Relevant Orders   Comp Met (CMET)   Lipid panel   Hypothyroidism - Primary   Relevant Orders   TSH    Return in  3 weeks (on 06/01/2021) for At 4:15 PM, labs the same day.  This visit occurred during the SARS-CoV-2 public health emergency.  Safety protocols were in place, including screening questions prior to the visit, additional usage of staff PPE, and extensive cleaning of exam room while observing appropriate contact time as indicated for disinfecting solutions.    Tommi Rumps, MD [place]

## 2021-05-11 NOTE — Assessment & Plan Note (Signed)
I suspect this is related to the firmness of her cerumen and the prior irrigation.  Discussed that this should heal over the next several weeks.  We will bring her back in 3 weeks for recheck.  If not healed at that time would consider ENT evaluation.

## 2021-05-22 ENCOUNTER — Telehealth: Payer: Self-pay | Admitting: Family Medicine

## 2021-05-22 NOTE — Telephone Encounter (Signed)
Patient called and stated she thinks she has C-diff. On Thanksgiving Day she had explosive diarrhea, it has gotten some what better with medication, but her stools are yellow. No appointments available today. Patient was transferred to Access Nurse.

## 2021-05-25 ENCOUNTER — Telehealth: Payer: Self-pay

## 2021-05-25 NOTE — Telephone Encounter (Signed)
Access nurse sent a record that the patient is having yellow stools and needed to be seen I called and spoke with the patient to see a provider at urgent care across the street tomorrow to be evaluated she was agreeable to that and will see them.  Keaundre Thelin,cma

## 2021-05-26 ENCOUNTER — Ambulatory Visit: Payer: Medicare Other

## 2021-05-30 ENCOUNTER — Telehealth: Payer: Self-pay | Admitting: Family Medicine

## 2021-05-30 ENCOUNTER — Telehealth (INDEPENDENT_AMBULATORY_CARE_PROVIDER_SITE_OTHER): Payer: Medicare Other | Admitting: Physician Assistant

## 2021-05-30 DIAGNOSIS — R194 Change in bowel habit: Secondary | ICD-10-CM | POA: Diagnosis not present

## 2021-05-30 DIAGNOSIS — R197 Diarrhea, unspecified: Secondary | ICD-10-CM

## 2021-05-30 MED ORDER — CLONAZEPAM 1 MG PO TABS: ORAL_TABLET | ORAL | 1 refills | Status: DC

## 2021-05-30 NOTE — Telephone Encounter (Signed)
Pt is requesting refill for clonazePAM (KLONOPIN) 1 MG tablet Pt uses [facility] in [place]

## 2021-05-30 NOTE — Telephone Encounter (Signed)
Sent to pharmacy 

## 2021-05-30 NOTE — Telephone Encounter (Signed)
RX Refill: klonopin Last Seen: 05-11-21 Last Ordered: 03-30-21 Next Appt: NA

## 2021-05-30 NOTE — Telephone Encounter (Signed)
Patient is  completley out of her Klonopin

## 2021-05-30 NOTE — Progress Notes (Signed)
Virtual Visit via Video Note  I connected with  Pam Salazar  on 05/30/21 at 11:45 AM EST by a video enabled telemedicine application and verified that I am speaking with the correct person using two identifiers.  Location: Patient: home Provider: Therapist, music at [facility] present: Patient and myself   I discussed the limitations of evaluation and management by telemedicine and the availability of in person appointments. The patient expressed understanding and agreed to proceed.   History of Present Illness:  Chief complaint: Diarrhea  Symptom onset: 05/17/21 started with urgent bowel movements, incontinence usually every 1/2 hour, Imodium helped. 05/27/21 - constipated, started Miralax, next day had a regular BM.  Yesterday, 05/29/21, had incontinence of stool and urgency again.   Pertinent positives: Some intermittent abdominal cramping & nausea only minimal. Foul odor to stool. Some weakness and fatigue.  Pertinent negatives: No blood or tarry stools. No fever or chills. No weight changes. No vomiting.  Treatments tried: Imodium, Powerade  Vaccine status: UTD  Sick exposure: COVID exposure a few weeks ago, but has not had any acute sickness and it was over lunch   Cheerios with almond milk yesterday and triggered loose stools again. Stopped with imodium. Had a bagel this morning and so far only pencil thin stool.   Benign polyps on last colonoscopy 05/05/2015   Observations/Objective:   Gen: Awake, alert, no acute distress Resp: Breathing is even and non-labored Psych: calm/pleasant demeanor Neuro: Alert and Oriented x 3, + facial symmetry, speech is clear.   Assessment and Plan: 1. Diarrhea, unspecified type 2. Change in bowel habits -Plan for CBC CMP FOBT GI profile stool labs today -Pending results, treat as needed -Encouraged pushing fluids, keeping hydrated, BRATY diet -F/up with PCP or GI if worse or no improvement    Follow Up  Instructions:    I discussed the assessment and treatment plan with the patient. The patient was provided an opportunity to ask questions and all were answered. The patient agreed with the plan and demonstrated an understanding of the instructions.   The patient was advised to call back or seek an in-person evaluation if the symptoms worsen or if the condition fails to improve as anticipated.  19 minutes 25 seconds total time on phone. Video connection was lost at >50% of the duration of the visit, at which time the remainder of the visit was completed via audio only.   Olon Russ M English Tomer, PA-C

## 2021-05-31 ENCOUNTER — Ambulatory Visit: Payer: Medicare Other | Admitting: Adult Health

## 2021-06-03 ENCOUNTER — Encounter: Payer: Self-pay | Admitting: Family Medicine

## 2021-06-04 ENCOUNTER — Other Ambulatory Visit (INDEPENDENT_AMBULATORY_CARE_PROVIDER_SITE_OTHER): Payer: Medicare Other

## 2021-06-04 ENCOUNTER — Other Ambulatory Visit: Payer: Self-pay

## 2021-06-04 DIAGNOSIS — R197 Diarrhea, unspecified: Secondary | ICD-10-CM

## 2021-06-04 DIAGNOSIS — E039 Hypothyroidism, unspecified: Secondary | ICD-10-CM

## 2021-06-04 DIAGNOSIS — R194 Change in bowel habit: Secondary | ICD-10-CM

## 2021-06-04 DIAGNOSIS — E7800 Pure hypercholesterolemia, unspecified: Secondary | ICD-10-CM | POA: Diagnosis not present

## 2021-06-05 ENCOUNTER — Encounter: Payer: Self-pay | Admitting: Physician Assistant

## 2021-06-05 LAB — LIPID PANEL
Cholesterol: 153 mg/dL (ref 0–200)
HDL: 53.6 mg/dL (ref >39.00)
LDL Cholesterol: 62 mg/dL (ref 0–99)
NonHDL: 99.86
Total CHOL/HDL Ratio: 3
Triglycerides: 189 mg/dL — ABNORMAL HIGH (ref 0.0–149.0)
VLDL: 37.8 mg/dL (ref 0.0–40.0)

## 2021-06-05 LAB — COMPREHENSIVE METABOLIC PANEL
ALT: 10 U/L (ref 0–35)
AST: 20 U/L (ref 0–37)
Albumin: 4.5 g/dL (ref 3.5–5.2)
Alkaline Phosphatase: 60 U/L (ref 39–117)
BUN: 45 mg/dL — ABNORMAL HIGH (ref 6–23)
CO2: 26 mEq/L (ref 19–32)
Calcium: 9.2 mg/dL (ref 8.4–10.5)
Chloride: 105 mEq/L (ref 96–112)
Creatinine, Ser: 1.02 mg/dL (ref 0.40–1.20)
GFR: 54.01 mL/min — ABNORMAL LOW (ref >60.00)
Glucose, Bld: 101 mg/dL — ABNORMAL HIGH (ref 70–99)
Potassium: 4.2 mEq/L (ref 3.5–5.1)
Sodium: 141 mEq/L (ref 135–145)
Total Bilirubin: 0.5 mg/dL (ref 0.2–1.2)
Total Protein: 6.5 g/dL (ref 6.0–8.3)

## 2021-06-05 LAB — TSH: TSH: 0.41 u[IU]/mL (ref 0.35–5.50)

## 2021-06-06 ENCOUNTER — Other Ambulatory Visit: Payer: Self-pay

## 2021-06-06 ENCOUNTER — Other Ambulatory Visit: Payer: Medicare Other

## 2021-06-06 ENCOUNTER — Other Ambulatory Visit: Payer: Self-pay | Admitting: *Deleted

## 2021-06-06 ENCOUNTER — Encounter: Payer: Self-pay | Admitting: Physician Assistant

## 2021-06-06 DIAGNOSIS — R197 Diarrhea, unspecified: Secondary | ICD-10-CM

## 2021-06-06 NOTE — Telephone Encounter (Signed)
Pt is returning callback from a 12pm call that she had missed from our office. Pt stated there was no message left. Pt was wondering if there is any update on the sample. Pt requesting callback.

## 2021-06-08 LAB — GI PROFILE, STOOL, PCR

## 2021-06-08 LAB — SPECIMEN STATUS REPORT

## 2021-06-13 ENCOUNTER — Other Ambulatory Visit: Payer: Self-pay | Admitting: Family Medicine

## 2021-06-13 DIAGNOSIS — R197 Diarrhea, unspecified: Secondary | ICD-10-CM

## 2021-06-19 ENCOUNTER — Telehealth: Payer: Self-pay | Admitting: Family Medicine

## 2021-06-19 DIAGNOSIS — R197 Diarrhea, unspecified: Secondary | ICD-10-CM

## 2021-06-19 NOTE — Telephone Encounter (Signed)
Patient stills would like to see GI, but she does not care to see Dr Lucilla Lame. Patient was wondering if there is any appointments in the [place] area.

## 2021-06-19 NOTE — Addendum Note (Signed)
Addended by: Caryl Bis, Romani Wilbon G on: 06/19/2021 01:11 PM   Modules accepted: Orders

## 2021-06-19 NOTE — Telephone Encounter (Signed)
The other option in [place] is [facility]. I will place a referral to them.

## 2021-06-20 ENCOUNTER — Ambulatory Visit (INDEPENDENT_AMBULATORY_CARE_PROVIDER_SITE_OTHER): Payer: Medicare Other | Admitting: Gastroenterology

## 2021-06-20 ENCOUNTER — Encounter: Payer: Self-pay | Admitting: Gastroenterology

## 2021-06-20 VITALS — BP 84/55 | HR 93 | Temp 97.7°F | Ht 62.0 in | Wt 125.0 lb

## 2021-06-20 DIAGNOSIS — K529 Noninfective gastroenteritis and colitis, unspecified: Secondary | ICD-10-CM | POA: Diagnosis not present

## 2021-06-20 MED ORDER — CLENPIQ 10-3.5-12 MG-GM -GM/160ML PO SOLN: 1.0000 | ORAL | 0 refills | Status: DC

## 2021-06-20 NOTE — Progress Notes (Signed)
Jonathon Bellows MD, MRCP(U.K) [address]  [place]  [place], [place] [address]  Main: [phone]  Fax: [phone]   Gastroenterology Consultation  Referring Provider:     Leone Haven, MD Primary Care Physician:  Leone Haven, MD Primary Gastroenterologist:  Dr. Jonathon Bellows  Reason for Consultation:     Diarrhea    HPI:   Pam Salazar is a 75 y.o. y/o female referred for consultation & management  by Dr. Caryl Bis, Angela Adam, MD.    She is here today to see me for diarrhea. Last colonoscopy was back in November 2016 with Dr. Maryruth Bun also for chronic diarrhea at that time.  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  Ablated with APC.  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  06/05/2019 2 GI PCR negative, TSH normal, CMP at baseline.  She states that she has had a history of diarrhea on and off for years.  It was doing well till Thanksgiving when she developed diarrhea followed by constipation which then resolved and recurred around Christmas time.  She has been taking meloxicam on and off.  Last taken a week back.  Denies any PPI use.  She has been on sertraline for years.  Denies any use of any artificial sugars or sweeteners in her diet.  No other complaints. Past Medical History:  Diagnosis Date   Arthritis    hips, lower back   Breast lump 10/21/2011   Overview:  Negative diagnostic mammogram 09/2011    Depression    Fracture of rib of right side 08/17/2013   Hypercholesterolemia    Hypothyroidism    Low blood pressure 10/25/2014   Panic disorder    previous agoraphobia   Seizures (HCC)    febrile - as infant   Shoulder pain, left    bursa   Vertigo    positional - rare   Vitamin D deficiency     Past Surgical History:  Procedure Laterality Date   ABDOMINAL HYSTERECTOMY  1981   partial - secondary to bleeding, ovaries not removed -    CATARACT EXTRACTION W/ INTRAOCULAR LENS  IMPLANT, BILATERAL     COLONOSCOPY WITH PROPOFOL N/A 05/05/2015    Procedure: COLONOSCOPY WITH PROPOFOL;  Surgeon: Lucilla Lame, MD;  Location: [facility];  Service: Endoscopy;  Laterality: N/A;   MASS EXCISION Right 12/24/2017   Procedure: EXCISION MASS ( SHIN );  Surgeon: Katha Cabal, MD;  Location: [facility];  Service: Vascular;  Laterality: Right;   ORIF FIBULA FRACTURE Right 2003   right lower extremitiy   shoulder Left    bone suprs   SHOULDER SURGERY  12/2015   TONSILLECTOMY  1974    Prior to Admission medications   Medication Sig Start Date End Date Taking? Authorizing Provider  clonazePAM (KLONOPIN) 1 MG tablet TAKE ONE TABLET BY MOUTH TWICE A DAY AS NEEDED FOR ANXEITY 05/30/21   Leone Haven, MD  diphenhydramine-acetaminophen (TYLENOL PM) 25-500 MG TABS tablet Take 1 tablet by mouth at bedtime.    [provider]  levothyroxine (SYNTHROID) 125 MCG tablet TAKE 1 TABLET(125 MCG) BY MOUTH DAILY BEFORE BREAKFAST 03/29/21   Leone Haven, MD  Melatonin 3 MG CAPS Take 1 capsule by mouth at bedtime as needed.    [provider]  meloxicam (MOBIC) 15 MG tablet TAKE ONE TABLET BY MOUTH DAILY 03/12/21   Leone Haven, MD  Multiple Vitamins-Minerals (PRESERVISION AREDS 2 PO) Take 1 capsule by mouth 2 (two) times  daily.     [provider]  sertraline (ZOLOFT) 100 MG tablet TAKE TWO TABLETS BY MOUTH DAILY 05/03/21   Leone Haven, MD  simvastatin (ZOCOR) 20 MG tablet Take 1 tablet (20 mg total) by mouth every evening. 02/27/21   Leone Haven, MD    Family History  Problem Relation Age of Onset   Arthritis Mother    Hyperlipidemia Mother    Arthritis Father    Prostate cancer Father    Breast cancer Sister 33   Heart disease Brother        heart attack   Diabetes Brother    Breast cancer Maternal Aunt 85   Colon cancer Maternal Uncle      Social History   Tobacco Use   Smoking status: Former    Types: Cigarettes   Smokeless tobacco: Former    Quit date: 07/26/2013   Tobacco comments:        Vaping Use   Vaping Use: Never used  Substance Use Topics   Alcohol use: No    Alcohol/week: 0.0 standard drinks   Drug use: No    Allergies as of 06/20/2021 - Review Complete 06/20/2021  Allergen Reaction Noted   Duloxetine hcl Other (See Comments) 04/14/2015   Ezetimibe-simvastatin Other (See Comments) 09/11/2011    Review of Systems:    All systems reviewed and negative except where noted in HPI.   Physical Exam:  BP (!) 84/55 (BP Location: Left Arm, Patient Position: Sitting, Cuff Size: Normal)    Pulse 93    Temp 97.7 F (36.5 C) (Oral)    Ht 5\' 2"  (1.575 m)    Wt 125 lb (56.7 kg)    BMI 22.86 kg/m  No LMP recorded. Patient has had a hysterectomy. Psych:  Alert and cooperative. Normal mood and affect. General:   Alert,  Well-developed, well-nourished, pleasant and cooperative in NAD Head:  Normocephalic and atraumatic. Eyes:  Sclera clear, no icterus.   Conjunctiva pink. Ears:  Normal auditory acuity.    Neurologic:  Alert and oriented x3;  grossly normal neurologically. Psych:  Alert and cooperative. Normal mood and affect.  Imaging Studies: No results found.  Assessment and Plan:   Pam Salazar is a 75 y.o. y/o female has been referred for chronic diarrhea.  Review of her chart shows that back in 2016 she had a colonoscopy also for the same indication at our practice and was found to have lymphocytic colitis.  It is very likely that she has had recurrence of the lymphocytic colitis at this point of time.  NSAIDs are associated with lymphocytic colitis along with medication such as sertraline.  As a first step we will stop the meloxicam and see if she feels any better.  Plan 1.  Fecal calprotectin to confirm inflammation 2.  Suggest repeat colonoscopy with biopsies to evaluate for lymphocytic colitis and I shall perform this next week 3.  Stop all NSAIDs. 4.  Can commence on Imodium 1 mg up to 4 times a day as needed.  She is a retired Merchandiser, retail.  I  am reluctant to go to a higher dose as she has also been having issues with constipation.  I have discussed alternative options, risks & benefits,  which include, but are not limited to, bleeding, infection, perforation,respiratory complication & drug reaction.  The patient agrees with this plan & written consent will be obtained.       Follow up in 2 to 3 weeks  Dr  Jonathon Bellows MD,MRCP(U.K)

## 2021-06-22 ENCOUNTER — Encounter: Payer: Self-pay | Admitting: Gastroenterology

## 2021-06-23 LAB — CALPROTECTIN, FECAL: Calprotectin, Fecal: <16 ug/g (ref 0–120)

## 2021-06-26 ENCOUNTER — Ambulatory Visit
Admission: RE | Admit: 2021-06-26 | Discharge: 2021-06-26 | Disposition: A | Discharge status: Home / Self Care | Payer: Medicare Other | Attending: Gastroenterology | Admitting: Gastroenterology

## 2021-06-26 ENCOUNTER — Ambulatory Visit: Payer: Medicare Other | Admitting: Certified Registered Nurse Anesthetist

## 2021-06-26 ENCOUNTER — Encounter: Payer: Self-pay | Admitting: Gastroenterology

## 2021-06-26 ENCOUNTER — Encounter
Admission: RE | Disposition: A | Discharge status: Home / Self Care | Payer: Self-pay | Source: Home / Self Care | Attending: Gastroenterology

## 2021-06-26 DIAGNOSIS — Z87891 Personal history of nicotine dependence: Secondary | ICD-10-CM | POA: Diagnosis not present

## 2021-06-26 DIAGNOSIS — K5730 Diverticulosis of large intestine without perforation or abscess without bleeding: Secondary | ICD-10-CM | POA: Diagnosis not present

## 2021-06-26 DIAGNOSIS — E7800 Pure hypercholesterolemia, unspecified: Secondary | ICD-10-CM | POA: Diagnosis not present

## 2021-06-26 DIAGNOSIS — K529 Noninfective gastroenteritis and colitis, unspecified: Secondary | ICD-10-CM

## 2021-06-26 DIAGNOSIS — Z1211 Encounter for screening for malignant neoplasm of colon: Secondary | ICD-10-CM | POA: Diagnosis not present

## 2021-06-26 DIAGNOSIS — E039 Hypothyroidism, unspecified: Secondary | ICD-10-CM | POA: Diagnosis not present

## 2021-06-26 DIAGNOSIS — Z86010 Personal history of colonic polyps: Secondary | ICD-10-CM | POA: Diagnosis not present

## 2021-06-26 HISTORY — PX: COLONOSCOPY WITH PROPOFOL: SHX5780

## 2021-06-26 SURGERY — COLONOSCOPY WITH PROPOFOL
Anesthesia: General

## 2021-06-26 MED ORDER — LIDOCAINE HCL (CARDIAC) PF 100 MG/5ML IV SOSY
PREFILLED_SYRINGE | INTRAVENOUS | Status: DC | PRN
  Administered 2021-06-26: 40 mg via INTRAVENOUS

## 2021-06-26 MED ORDER — PROPOFOL 500 MG/50ML IV EMUL
INTRAVENOUS | Status: DC | PRN
  Administered 2021-06-26: 150 ug/kg/min via INTRAVENOUS

## 2021-06-26 MED ORDER — EPHEDRINE 5 MG/ML INJ
INTRAVENOUS | Status: AC
  Filled 2021-06-26: qty 5

## 2021-06-26 MED ORDER — PROPOFOL 10 MG/ML IV BOLUS
INTRAVENOUS | Status: DC | PRN
  Administered 2021-06-26: 60 mg via INTRAVENOUS

## 2021-06-26 MED ORDER — SODIUM CHLORIDE 0.9 % IV SOLN: INTRAVENOUS | Status: DC

## 2021-06-26 MED ORDER — EPHEDRINE SULFATE 50 MG/ML IJ SOLN
INTRAMUSCULAR | Status: DC | PRN
  Administered 2021-06-26 (×2): 5 mg via INTRAVENOUS

## 2021-06-26 NOTE — Transfer of Care (Signed)
Immediate Anesthesia Transfer of Care Note  Patient: Pam Salazar  Procedure(s) Performed: COLONOSCOPY WITH PROPOFOL  Patient Location: Endoscopy Unit  Anesthesia Type:General  Level of Consciousness: sedated  Airway & Oxygen Therapy: Patient Spontanous Breathing  Post-op Assessment: Report given to RN and Post -op Vital signs reviewed and stable  Post vital signs: Reviewed and stable  Last Vitals:  Vitals Value Taken Time  BP 97/55 06/26/21 1114  Temp    Pulse 76 06/26/21 1114  Resp 16 06/26/21 1114  SpO2 100 % 06/26/21 1114  Vitals shown include unvalidated device data.  Last Pain:  Vitals:   06/26/21 1014  TempSrc: Temporal         Complications: No notable events documented.

## 2021-06-26 NOTE — Anesthesia Preprocedure Evaluation (Signed)
Anesthesia Evaluation  Patient identified by MRN, date of birth, ID band Patient awake    Reviewed: Allergy & Precautions, NPO status , Patient's Chart, lab work & pertinent test results  Airway Mallampati: III  TM Distance: >3 FB Neck ROM: full    Dental  (+) Chipped   Pulmonary neg shortness of breath, former smoker,    Pulmonary exam normal        Cardiovascular Exercise Tolerance: Good (-) anginanegative cardio ROS Normal cardiovascular exam     Neuro/Psych Seizures -,  negative psych ROS   GI/Hepatic negative GI ROS, Neg liver ROS, neg GERD  ,  Endo/Other  Hypothyroidism   Renal/GU negative Renal ROS  negative genitourinary   Musculoskeletal   Abdominal   Peds  Hematology negative hematology ROS (+)   Anesthesia Other Findings Past Medical History: No date: Arthritis     Comment:  hips, lower back 10/21/2011: Breast lump     Comment:  Overview:  Negative diagnostic mammogram 09/2011  No date: Depression 08/17/2013: Fracture of rib of right side No date: Hypercholesterolemia No date: Hypothyroidism 10/25/2014: Low blood pressure No date: Panic disorder     Comment:  previous agoraphobia No date: Seizures ([place])     Comment:  febrile - as infant No date: Shoulder pain, left     Comment:  bursa No date: Vertigo     Comment:  positional - rare No date: Vitamin D deficiency  Past Surgical History: 1981: ABDOMINAL HYSTERECTOMY     Comment:  partial - secondary to bleeding, ovaries not removed -                vaginal No date: CATARACT EXTRACTION W/ INTRAOCULAR LENS  IMPLANT, BILATERAL 05/05/2015: COLONOSCOPY WITH PROPOFOL; N/A     Comment:  Procedure: COLONOSCOPY WITH PROPOFOL;  Surgeon: Lucilla Lame, MD;  Location: [facility];  Service:               Endoscopy;  Laterality: N/A; 12/24/2017: MASS EXCISION; Right     Comment:  Procedure: EXCISION MASS ( SHIN );  Surgeon: Katha Cabal, MD;  Location: [facility];  Service: Vascular;                Laterality: Right; 2003: ORIF FIBULA FRACTURE; Right     Comment:  right lower extremitiy 09/28/2020: REPLACEMENT TOTAL KNEE; Right No date: shoulder; Left     Comment:  bone suprs 12/2015: SHOULDER SURGERY 1974: TONSILLECTOMY  BMI    Body Mass Index: 22.86 kg/m      Reproductive/Obstetrics negative OB ROS                             Anesthesia Physical Anesthesia Plan  ASA: 3  Anesthesia Plan: General   Post-op Pain Management:    Induction: Intravenous  PONV Risk Score and Plan: Propofol infusion and TIVA  Airway Management Planned: Natural Airway and Nasal Cannula  Additional Equipment:   Intra-op Plan:   Post-operative Plan:   Informed Consent: I have reviewed the patients History and Physical, chart, labs and discussed the procedure including the risks, benefits and alternatives for the proposed anesthesia with the patient or authorized representative who has indicated his/her understanding and acceptance.     Dental Advisory Given  Plan Discussed  with: Anesthesiologist, CRNA and Surgeon  Anesthesia Plan Comments: (Patient consented for risks of anesthesia including but not limited to:  - adverse reactions to medications - risk of airway placement if required - damage to eyes, teeth, lips or other oral mucosa - nerve damage due to positioning  - sore throat or hoarseness - Damage to heart, brain, nerves, lungs, other parts of body or loss of life  Patient voiced understanding.)        Anesthesia Quick Evaluation

## 2021-06-26 NOTE — Op Note (Signed)
[facility] Patient Name: Pam Salazar Procedure Date: 06/26/2021 10:44 AM MRN: [id number] Account #: [id number] Date of Birth: 1946/01/01 Admit Type: Outpatient Age: 76 Room: [facility] ENDO ROOM 2 Gender: Female Note Status: Finalized Instrument Name: Jasper Riling [id number] Procedure:             Colonoscopy Indications:           Chronic diarrhea Providers:             Jonathon Bellows MD, MD Referring MD:          Angela Adam. Caryl Bis (Referring MD) Medicines:             Monitored Anesthesia Care Complications:         No immediate complications. Procedure:             Pre-Anesthesia Assessment:                        - Prior to the procedure, a History and Physical was                         performed, and patient medications, allergies and                         sensitivities were reviewed. The patient's tolerance                         of previous anesthesia was reviewed.                        - The risks and benefits of the procedure and the                         sedation options and risks were discussed with the                         patient. All questions were answered and informed                         consent was obtained.                        - ASA Grade Assessment: II - A patient with mild                         systemic disease.                        After obtaining informed consent, the colonoscope was                         passed under direct vision. Throughout the procedure,                         the patient's blood pressure, pulse, and oxygen                         saturations were monitored continuously. The                         Colonoscope was introduced through the anus  and                         advanced to the the cecum, identified by the                         appendiceal orifice. The colonoscopy was performed                         with ease. The patient tolerated the procedure well.                         The  quality of the bowel preparation was good. Findings:      The perianal and digital rectal examinations were normal.      The colon (entire examined portion) appeared normal. Biopsies were taken       with a cold forceps for histology.      The retroflexed view of the distal rectum and anal verge was normal and       showed no anal or rectal abnormalities. Impression:            - The entire examined colon is normal. Biopsied.                        - The distal rectum and anal verge are normal on                         retroflexion view. Recommendation:        - Discharge patient to home (with escort).                        - Resume previous diet.                        - Continue present medications.                        - Await pathology results.                        - Return to my office as previously scheduled. Procedure Code(s):     --- Professional ---                        [phone], Colonoscopy, flexible; with biopsy, single or                         multiple Diagnosis Code(s):     --- Professional ---                        K52.9, Noninfective gastroenteritis and colitis,                         unspecified CPT copyright 2019 American Medical Association. All rights reserved. The codes documented in this report are preliminary and upon coder review may  be revised to meet current compliance requirements. Jonathon Bellows, MD Jonathon Bellows MD, MD 06/26/2021 11:12:58 AM This report has been signed electronically. Number of Addenda: 0 Note Initiated On: 06/26/2021 10:44 AM Scope Withdrawal Time: 0 hours 11 minutes 22 seconds  Total Procedure Duration:  0 hours 19 minutes 20 seconds  Estimated Blood Loss:  Estimated blood loss: none.      [facility]

## 2021-06-26 NOTE — Anesthesia Postprocedure Evaluation (Signed)
Anesthesia Post Note  Patient: Pam Salazar  Procedure(s) Performed: COLONOSCOPY WITH PROPOFOL  Patient location during evaluation: Endoscopy Anesthesia Type: General Level of consciousness: awake and alert Pain management: pain level controlled Vital Signs Assessment: post-procedure vital signs reviewed and stable Respiratory status: spontaneous breathing, nonlabored ventilation, respiratory function stable and patient connected to nasal cannula oxygen Cardiovascular status: blood pressure returned to baseline and stable Postop Assessment: no apparent nausea or vomiting Anesthetic complications: no   No notable events documented.   Last Vitals:  Vitals:   06/26/21 1123 06/26/21 1133  BP: 100/61 97/61  Pulse:    Resp:    Temp:    SpO2:      Last Pain:  Vitals:   06/26/21 1133  TempSrc:   PainSc: 0-No pain                 Pam Salazar

## 2021-06-26 NOTE — H&P (Signed)
Jonathon Bellows, MD [address] [place], [place], [place], [address] [address], [place], [place], [place], [address] Phone: [phone]  Fax: [phone]  Primary Care Physician:  Leone Haven, MD   Pre-Procedure History & Physical: HPI:  Pam Salazar is a 76 y.o. female is here for an colonoscopy.   Past Medical History:  Diagnosis Date   Arthritis    hips, lower back   Breast lump 10/21/2011   Overview:  Negative diagnostic mammogram 09/2011    Depression    Fracture of rib of right side 08/17/2013   Hypercholesterolemia    Hypothyroidism    Low blood pressure 10/25/2014   Panic disorder    previous agoraphobia   Seizures (HCC)    febrile - as infant   Shoulder pain, left    bursa   Vertigo    positional - rare   Vitamin D deficiency     Past Surgical History:  Procedure Laterality Date   ABDOMINAL HYSTERECTOMY  1981   partial - secondary to bleeding, ovaries not removed -  vaginal   CATARACT EXTRACTION W/ INTRAOCULAR LENS  IMPLANT, BILATERAL     COLONOSCOPY WITH PROPOFOL N/A 05/05/2015   Procedure: COLONOSCOPY WITH PROPOFOL;  Surgeon: Lucilla Lame, MD;  Location: [facility];  Service: Endoscopy;  Laterality: N/A;   MASS EXCISION Right 12/24/2017   Procedure: EXCISION MASS ( SHIN );  Surgeon: Katha Cabal, MD;  Location: [facility];  Service: Vascular;  Laterality: Right;   ORIF FIBULA FRACTURE Right 2003   right lower extremitiy   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   shoulder Left    bone suprs   SHOULDER SURGERY  12/2015   TONSILLECTOMY  1974    Prior to Admission medications   Medication Sig Start Date End Date Taking? Authorizing Provider  clonazePAM (KLONOPIN) 1 MG tablet TAKE ONE TABLET BY MOUTH TWICE A DAY AS NEEDED FOR ANXEITY 05/30/21  Yes Leone Haven, MD  levothyroxine (SYNTHROID) 125 MCG tablet TAKE 1 TABLET(125 MCG) BY MOUTH DAILY BEFORE BREAKFAST 03/29/21  Yes Leone Haven, MD  Melatonin 3 MG CAPS  Take 1 capsule by mouth at bedtime as needed.   Yes [provider]  sertraline (ZOLOFT) 100 MG tablet TAKE TWO TABLETS BY MOUTH DAILY 05/03/21  Yes Leone Haven, MD  simvastatin (ZOCOR) 20 MG tablet Take 1 tablet (20 mg total) by mouth every evening. 02/27/21  Yes Leone Haven, MD  diphenhydramine-acetaminophen (TYLENOL PM) 25-500 MG TABS tablet Take 1 tablet by mouth at bedtime.    [provider]  meloxicam (MOBIC) 15 MG tablet TAKE ONE TABLET BY MOUTH DAILY Patient not taking: Reported on 06/26/2021 03/12/21   Leone Haven, MD  Multiple Vitamins-Minerals (PRESERVISION AREDS 2 PO) Take 1 capsule by mouth 2 (two) times daily.     [provider]  Sod Picosulfate-Mag Ox-Cit Acd (CLENPIQ) 10-3.5-12 MG-GM -GM/160ML SOLN Take 1 kit by mouth as directed. At 5 PM evening before procedure, drink 1 bottle of Clenpiq, hydrate, drink (5) 8 oz of water. Then do the same thing 5 hours prior to your procedure. 06/20/21   Jonathon Bellows, MD    Allergies as of 06/20/2021 - Review Complete 06/20/2021  Allergen Reaction Noted   Duloxetine hcl Other (See Comments) 04/14/2015   Ezetimibe-simvastatin Other (See Comments) 09/11/2011    Family History  Problem Relation Age of Onset   Arthritis Mother    Hyperlipidemia Mother  Arthritis Father    Prostate cancer Father    Breast cancer Sister 38   Heart disease Brother        heart attack   Diabetes Brother    Breast cancer Maternal Aunt 85   Colon cancer Maternal Uncle     Social History   Socioeconomic History   Marital status: Married    Spouse name: Not on file   Number of children: Not on file   Years of education: Not on file   Highest education level: Not on file  Occupational History   Not on file  Tobacco Use   Smoking status: Former    Types: Cigarettes   Smokeless tobacco: Former    Quit date: 07/26/2013   Tobacco comments:       Vaping Use   Vaping Use: Never used  Substance and Sexual  Activity   Alcohol use: No    Alcohol/week: 0.0 standard drinks   Drug use: No   Sexual activity: Yes    Birth control/protection: Post-menopausal  Other Topics Concern   Not on file  Social History Narrative   Former Marine scientist   Social Determinants of Health   Financial Resource Strain: Low Risk    Difficulty of Paying Living Expenses: Not hard at all  Food Insecurity: No Food Insecurity   Worried About Charity fundraiser in the Last Year: Never true   Arboriculturist in the Last Year: Never true  Transportation Needs: No Transportation Needs   Lack of Transportation (Medical): No   Lack of Transportation (Non-Medical): No  Physical Activity: Insufficiently Active   Days of Exercise per Week: 3 days   Minutes of Exercise per Session: 20 min  Stress: No Stress Concern Present   Feeling of Stress : Not at all  Social Connections: Socially Integrated   Frequency of Communication with Friends and Family: More than three times a week   Frequency of Social Gatherings with Friends and Family: More than three times a week   Attends Religious Services: More than 4 times per year   Active Member of [employer] or Organizations: Yes   Attends Music therapist: More than 4 times per year   Marital Status: Married  Human resources officer Violence: Not At Risk   Fear of Current or Ex-Partner: No   Emotionally Abused: No   Physically Abused: No   Sexually Abused: No    Review of Systems: See HPI, otherwise negative ROS  Physical Exam: BP 100/75    Pulse 74    Temp (!) 96.6 F (35.9 C) (Temporal)    Resp 14    Ht '5\' 2"'  (1.575 m)    Wt 56.7 kg    SpO2 100%    BMI 22.86 kg/m  General:   Alert,  pleasant and cooperative in NAD Head:  Normocephalic and atraumatic. Neck:  Supple; no masses or thyromegaly. Lungs:  Clear throughout to auscultation, normal respiratory effort.    Heart:  +S1, +S2, Regular rate and rhythm, No edema. Abdomen:  Soft, nontender and nondistended. Normal bowel  sounds, without guarding, and without rebound.   Neurologic:  Alert and  oriented x4;  grossly normal neurologically.  Impression/Plan: Willow Ora is here for an colonoscopy to be performed for diarrhea . Risks, benefits, limitations, and alternatives regarding  colonoscopy have been reviewed with the patient.  Questions have been answered.  All parties agreeable.   Jonathon Bellows, MD  06/26/2021, 10:42 AM

## 2021-06-27 ENCOUNTER — Encounter: Payer: Self-pay | Admitting: Gastroenterology

## 2021-06-28 LAB — SURGICAL PATHOLOGY

## 2021-07-02 ENCOUNTER — Other Ambulatory Visit: Payer: Self-pay

## 2021-07-02 ENCOUNTER — Ambulatory Visit (INDEPENDENT_AMBULATORY_CARE_PROVIDER_SITE_OTHER): Payer: Medicare Other | Admitting: Gastroenterology

## 2021-07-02 ENCOUNTER — Encounter: Payer: Self-pay | Admitting: Gastroenterology

## 2021-07-02 VITALS — BP 88/57 | HR 75 | Temp 97.9°F | Ht 62.0 in | Wt 125.8 lb

## 2021-07-02 DIAGNOSIS — K52831 Collagenous colitis: Secondary | ICD-10-CM | POA: Diagnosis not present

## 2021-07-02 NOTE — Progress Notes (Signed)
°  °Pam Anna MD, MRCP(U.K) °[address]  °[address]  °[place],  [address]  °Main: [phone]  °Fax: [phone] ° ° °Primary Care Physician: Sonnenberg, Eric G, MD ° °Primary Gastroenterologist:  Dr. Kiran Salazar  ° ° °C.c : Follow up diarrhea  ° ° °HPI: Pam Salazar is a 76 y.o. female ° ° °Summary of history : ° °Initially referred and see on 06/20/2021 for diarrhea. She states that she has had a history of diarrhea on and off for years.  It was doing well till Thanksgiving when she developed diarrhea followed by constipation which then resolved and recurred around Christmas time.  She has been taking meloxicam on and off.  Last taken a week back.  Denies any PPI use.  She has been on sertraline for years.  Denies any use of any artificial sugars or sweeteners in her diet.  No other complaints. ° °Last colonoscopy was back in November 2016 with Dr. Woon also for chronic diarrhea at that time.  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  Ablated with APC.  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 °  °06/05/2019 2 GI PCR negative, TSH normal, CMP at baseline. °  ° ° °Interval history   06/20/2021-07/02/2021 ° °06/26/2021: Colonoscopy : Normal appearance of colon , random colon bx showed mild active colitis and findings consistent with collagenous colitis.  ° °After her last visit she stopped taking the meloxicam and all her diarrhea resolved.  Presently not taking any Imodium.  Continues to take sertraline.  No other complaints presently.  She has 1 formed bowel movement per day. ° °Current Outpatient Medications  °Medication Sig Dispense Refill  ° clonazePAM (KLONOPIN) 1 MG tablet TAKE ONE TABLET BY MOUTH TWICE A DAY AS NEEDED FOR ANXEITY 60 tablet 1  ° diphenhydramine-acetaminophen (TYLENOL PM) 25-500 MG TABS tablet Take 1 tablet by mouth at bedtime.    ° levothyroxine (SYNTHROID) 125 MCG tablet TAKE 1 TABLET(125 MCG) BY MOUTH DAILY BEFORE BREAKFAST 90 tablet 1  ° Melatonin 3 MG CAPS Take 1 capsule by  mouth at bedtime as needed.    ° meloxicam (MOBIC) 15 MG tablet TAKE ONE TABLET BY MOUTH DAILY 10 tablet 0  ° Multiple Vitamins-Minerals (PRESERVISION AREDS 2 PO) Take 1 capsule by mouth 2 (two) times daily.     ° sertraline (ZOLOFT) 100 MG tablet TAKE TWO TABLETS BY MOUTH DAILY 180 tablet 1  ° simvastatin (ZOCOR) 20 MG tablet Take 1 tablet (20 mg total) by mouth every evening. 90 tablet 1  ° Sod Picosulfate-Mag Ox-Cit Acd (CLENPIQ) 10-3.5-12 MG-GM -GM/160ML SOLN Take 1 kit by mouth as directed. At 5 PM evening before procedure, drink 1 bottle of Clenpiq, hydrate, drink (5) 8 oz of water. Then do the same thing 5 hours prior to your procedure. 320 mL 0  ° °No current facility-administered medications for this visit.  ° ° °Allergies as of 07/02/2021 - Review Complete 07/02/2021  °Allergen Reaction Noted  ° Duloxetine hcl Other (See Comments) 04/14/2015  ° Ezetimibe-simvastatin Other (See Comments) 09/11/2011  ° ° °ROS: ° °General: Negative for anorexia, weight loss, fever, chills, fatigue, weakness. °ENT: Negative for hoarseness, difficulty swallowing , nasal congestion. °CV: Negative for chest pain, angina, palpitations, dyspnea on exertion, peripheral edema.  °Respiratory: Negative for dyspnea at rest, dyspnea on exertion, cough, sputum, wheezing.  °GI: See history of present illness. °GU:  Negative for dysuria, hematuria, urinary incontinence, urinary frequency, nocturnal urination.  °Endo: Negative for unusual weight change.  °  °  Physical Examination:   BP (!) 88/57    Pulse 75    Temp 97.9 F (36.6 C) (Oral)    Ht 5' 2" (1.575 m)    Wt 125 lb 12.8 oz (57.1 kg)    BMI 23.01 kg/m   General: Well-nourished, well-developed in no acute distress.  Eyes: No icterus. Conjunctivae pink. Neuro: Alert and oriented x 3.  Grossly intact. Skin: Warm and dry, no jaundice.   Psych: Alert and cooperative, normal mood and affect.   Imaging Studies: No results found.  Assessment and Plan:   Pam Salazar is  a 76 y.o. y/o female is here today to follow up  for chronic diarrhea.  Review of her chart shows that back in 2016 she had a colonoscopy also for the same indication at our practice and was found to have lymphocytic colitis.  Repeat colonoscopy by myself confirms she still has collagenous colitis .  Medications are often the main factor behind collagenous colitis amongst which NSAIDs, PPI and sertraline at the top of the list.  Since all her symptoms have stopped after taking meloxicam it is very likely the culprit.  Advised her that the diarrhea returns to call my office.   Plan 1.    Stop all NSAIDs.   Dr Jonathon Bellows  MD,MRCP [facility]) Follow up in as needed

## 2021-07-02 NOTE — Telephone Encounter (Signed)
This was handled patient was seen.  Pam Salazar,cma

## 2021-07-23 DIAGNOSIS — M1611 Unilateral primary osteoarthritis, right hip: Secondary | ICD-10-CM | POA: Diagnosis not present

## 2021-07-23 DIAGNOSIS — M19012 Primary osteoarthritis, left shoulder: Secondary | ICD-10-CM | POA: Diagnosis not present

## 2021-07-26 ENCOUNTER — Other Ambulatory Visit: Payer: Self-pay | Admitting: Family Medicine

## 2021-07-27 NOTE — Telephone Encounter (Signed)
Refilled: 05/30/2021 Last OV: 05/11/2021 Next OV: not scheduled

## 2021-08-23 ENCOUNTER — Telehealth: Payer: Self-pay | Admitting: Family Medicine

## 2021-08-23 ENCOUNTER — Telehealth: Payer: Medicare Other | Admitting: Family

## 2021-08-23 NOTE — Telephone Encounter (Signed)
Pt called in stating the left side of her face going back behind the ear is painful, sore and she can barely touch it. Pt also stated she has a headache and eye pain. This all occurred this morning. Sent to access nurse  ?

## 2021-08-24 ENCOUNTER — Other Ambulatory Visit: Payer: Self-pay | Admitting: Family Medicine

## 2021-08-24 DIAGNOSIS — R519 Headache, unspecified: Secondary | ICD-10-CM | POA: Diagnosis not present

## 2021-09-01 ENCOUNTER — Encounter: Payer: Self-pay | Admitting: Family Medicine

## 2021-09-05 ENCOUNTER — Ambulatory Visit: Payer: Medicare Other | Admitting: Family Medicine

## 2021-09-19 ENCOUNTER — Other Ambulatory Visit: Payer: Self-pay | Admitting: Family Medicine

## 2021-09-19 NOTE — Telephone Encounter (Signed)
Refill sent to pharmacy.  This patient needs a follow-up visit scheduled to receive further refills.  Please call them to get this scheduled.  Thanks. ?

## 2021-09-28 DIAGNOSIS — R051 Acute cough: Secondary | ICD-10-CM | POA: Diagnosis not present

## 2021-09-28 DIAGNOSIS — Z20822 Contact with and (suspected) exposure to covid-19: Secondary | ICD-10-CM | POA: Diagnosis not present

## 2021-09-28 DIAGNOSIS — R059 Cough, unspecified: Secondary | ICD-10-CM | POA: Diagnosis not present

## 2021-09-29 ENCOUNTER — Other Ambulatory Visit
Admission: RE | Admit: 2021-09-29 | Discharge: 2021-09-29 | Disposition: A | Discharge status: Home / Self Care | Payer: Medicare Other | Source: Ambulatory Visit | Attending: Internal Medicine | Admitting: Internal Medicine

## 2021-09-29 DIAGNOSIS — M419 Scoliosis, unspecified: Secondary | ICD-10-CM | POA: Diagnosis not present

## 2021-09-29 DIAGNOSIS — R0789 Other chest pain: Secondary | ICD-10-CM | POA: Insufficient documentation

## 2021-09-29 LAB — D-DIMER, QUANTITATIVE: D-Dimer, Quant: 0.77 ug/mL-FEU — ABNORMAL HIGH (ref 0.00–0.50)

## 2021-10-03 ENCOUNTER — Telehealth: Payer: Self-pay | Admitting: Family Medicine

## 2021-10-03 ENCOUNTER — Encounter: Payer: Self-pay | Admitting: Family Medicine

## 2021-10-03 ENCOUNTER — Ambulatory Visit (INDEPENDENT_AMBULATORY_CARE_PROVIDER_SITE_OTHER): Payer: Medicare Other | Admitting: Family Medicine

## 2021-10-03 VITALS — BP 120/80 | HR 74 | Temp 98.1°F | Ht 62.0 in | Wt 123.4 lb

## 2021-10-03 DIAGNOSIS — M940 Chondrocostal junction syndrome [Tietze]: Secondary | ICD-10-CM | POA: Diagnosis not present

## 2021-10-03 DIAGNOSIS — F419 Anxiety disorder, unspecified: Secondary | ICD-10-CM | POA: Diagnosis not present

## 2021-10-03 DIAGNOSIS — E039 Hypothyroidism, unspecified: Secondary | ICD-10-CM

## 2021-10-03 DIAGNOSIS — F32A Depression, unspecified: Secondary | ICD-10-CM

## 2021-10-03 MED ORDER — CLONAZEPAM 0.5 MG PO TABS: 0.5000 mg | ORAL_TABLET | Freq: Two times a day (BID) | ORAL | 0 refills | Status: DC

## 2021-10-03 MED ORDER — CLONAZEPAM 0.5 MG PO TABS: 0.2500 mg | ORAL_TABLET | Freq: Two times a day (BID) | ORAL | 0 refills | Status: DC

## 2021-10-03 MED ORDER — CLONAZEPAM 0.5 MG PO TABS: 0.7500 mg | ORAL_TABLET | Freq: Two times a day (BID) | ORAL | 0 refills | Status: DC

## 2021-10-03 NOTE — Assessment & Plan Note (Signed)
Very well controlled.  We will try to taper her off of clonazepam.  Given the duration that she has been on this it may take some time.  We will have her decrease clonazepam to 0.75 mg twice daily for 1 month and then to 0.5 mg twice daily for 1 month and then 0.25 mg twice daily for 1 month.  After that she will try to discontinue the clonazepam.  If she has any difficulty tapering off of this she will let us know.  She will continue Zoloft 200 mg daily.  She will continue exercise. ?

## 2021-10-03 NOTE — Telephone Encounter (Signed)
The directions were correct.  There should have been future dates that 2 of the prescriptions were to be filled on.  The patient is supposed to take clonazepam 0.75 mg twice daily for 30 days and then on 5/12 she will start clonazepam 0.5 mg twice daily for 30 days.  Then on 6/11 she will start clonazepam 0.25 mg twice daily for 30 days.  After that she will discontinue the clonazepam. ?

## 2021-10-03 NOTE — Assessment & Plan Note (Signed)
Most recent TSH was stable.  She will continue Synthroid 125 mcg once daily. ?

## 2021-10-03 NOTE — Assessment & Plan Note (Signed)
Significantly improved.  She will monitor for recurrence. ?

## 2021-10-03 NOTE — Telephone Encounter (Signed)
Pam Salazar From [facility] called in stating that medication (clonazePAM (KLONOPIN) 0.5 MG tablet) scripts was sent to there pharmacy 3 times with different direction... Lisa from pharmacy is calling in to verify the correct direction... Pam Salazar is requesting a callback  ?

## 2021-10-03 NOTE — Patient Instructions (Signed)
Nice to see you. ?Please monitor your ribs and if the pain comes back please let us know. ?We will try to taper you off of the clonazepam.  You will take 0.75 mg of clonazepam twice daily for 1 month.  You will then decrease to clonazepam 0.5 mg twice daily for 1 month.  After that you will go to clonazepam 0.25 mg twice daily for 1 month and then discontinue the clonazepam. ?

## 2021-10-03 NOTE — Progress Notes (Signed)
?Tommi Rumps, MD ?Phone: [phone] ? ?Pam Salazar is a 76 y.o. female who presents today for follow-up. ? ?Anxiety/depression: Patient notes he things are great.  She wonders about coming off of Klonopin.  She notes exercising has been incredibly helpful for her mood.  She is been walking 1.5 miles a day.  She is doing some dynamic aging exercises.  She continues on Zoloft.  No SI. ? ?Hypothyroidism: She is taking Synthroid.  She has chronic dry skin.  She has had chronic mild cold intolerance as she has aged. ? ?Costochondritis: Patient notes she went to the walk-in clinic.  Her D-dimer was negative for her age.  She received a steroid injection and meloxicam.  She notes the discomfort has gone away.  She noted no injury. ? ?Social History  ? ?Tobacco Use  ?Smoking Status Former  ? Types: Cigarettes  ?Smokeless Tobacco Former  ? Quit date: 07/26/2013  ?Tobacco Comments  ?    ? ? ?Current Outpatient Medications on File Prior to Visit  ?Medication Sig Dispense Refill  ? diphenhydramine-acetaminophen (TYLENOL PM) 25-500 MG TABS tablet Take 1 tablet by mouth at bedtime.    ? levothyroxine (SYNTHROID) 125 MCG tablet TAKE ONE TABLET BY MOUTH DAILY BEFORE BREAKFAST 90 tablet 1  ? Melatonin 3 MG CAPS Take 1 capsule by mouth at bedtime as needed.    ? Multiple Vitamins-Minerals (PRESERVISION AREDS 2 PO) Take 1 capsule by mouth 2 (two) times daily.     ? sertraline (ZOLOFT) 100 MG tablet TAKE TWO TABLETS BY MOUTH DAILY 180 tablet 1  ? simvastatin (ZOCOR) 20 MG tablet TAKE ONE TABLET BY MOUTH EVERY EVENING 90 tablet 1  ? ?No current facility-administered medications on file prior to visit.  ? ? ? ?ROS see history of present illness ? ?Objective ? ?Physical Exam ?Vitals:  ? 10/03/21 1144  ?BP: 120/80  ?Pulse: 74  ?Temp: 98.1 ?F (36.7 ?C)  ?SpO2: 96%  ? ? ?BP Readings from Last 3 Encounters:  ?10/03/21 120/80  ?07/02/21 (!) 88/57  ?06/26/21 97/61  ? ?Wt Readings from Last 3 Encounters:  ?10/03/21 123 lb 6.4 oz (56  kg)  ?07/02/21 125 lb 12.8 oz (57.1 kg)  ?06/26/21 125 lb (56.7 kg)  ? ? ?Physical Exam ?Constitutional:   ?   General: She is not in acute distress. ?   Appearance: She is not diaphoretic.  ?Cardiovascular:  ?   Rate and Rhythm: Normal rate and regular rhythm.  ?   Heart sounds: Normal heart sounds.  ?Pulmonary:  ?   Effort: Pulmonary effort is normal.  ?   Breath sounds: Normal breath sounds.  ?Skin: ?   General: Skin is warm and dry.  ?Neurological:  ?   Mental Status: She is alert.  ? ? ? ?Assessment/Plan: Please see individual problem list. ? ?Problem List Items Addressed This Visit   ? ? Anxiety and depression - Primary (Chronic)  ?  Very well controlled.  We will try to taper her off of clonazepam.  Given the duration that she has been on this it may take some time.  We will have her decrease clonazepam to 0.75 mg twice daily for 1 month and then to 0.5 mg twice daily for 1 month and then 0.25 mg twice daily for 1 month.  After that she will try to discontinue the clonazepam.  If she has any difficulty tapering off of this she will let us know.  She will continue Zoloft 200 mg daily.  She will continue exercise. ?  ?  ? Relevant Medications  ? clonazePAM (KLONOPIN) 0.5 MG tablet (Start on 12/02/2021)  ? clonazePAM (KLONOPIN) 0.5 MG tablet (Start on 11/02/2021)  ? clonazePAM (KLONOPIN) 0.5 MG tablet  ? Hypothyroidism (Chronic)  ?  Most recent TSH was stable.  She will continue Synthroid 125 mcg once daily. ?  ?  ? Costochondritis  ?  Significantly improved.  She will monitor for recurrence. ?  ?  ? ? ? ? ?Return for As scheduled. ? ?This visit occurred during the SARS-CoV-2 public health emergency.  Safety protocols were in place, including screening questions prior to the visit, additional usage of staff PPE, and extensive cleaning of exam room while observing appropriate contact time as indicated for disinfecting solutions.  ? ? ?Tommi Rumps, MD ?[place] ? ?

## 2021-10-04 NOTE — Telephone Encounter (Signed)
I called the pharmacy and they stated that the person that called made a mistake and the medication was filled and ready for pickup I then called the patient and informed her and she understood.  Tamala Manzer,cma  ?

## 2021-10-04 NOTE — Telephone Encounter (Signed)
Pt called stating that pharmacy is confused about the directions that the provider gave. Pt stated that the pharmacy would like to be called ?

## 2021-10-09 DIAGNOSIS — Z20822 Contact with and (suspected) exposure to covid-19: Secondary | ICD-10-CM | POA: Diagnosis not present

## 2021-10-14 ENCOUNTER — Encounter: Payer: Self-pay | Admitting: Family Medicine

## 2021-10-15 MED ORDER — HYDROXYZINE HCL 10 MG PO TABS: 10.0000 mg | ORAL_TABLET | Freq: Three times a day (TID) | ORAL | 0 refills | Status: DC | PRN

## 2021-10-22 DIAGNOSIS — Z20822 Contact with and (suspected) exposure to covid-19: Secondary | ICD-10-CM | POA: Diagnosis not present

## 2021-10-24 ENCOUNTER — Encounter: Payer: Self-pay | Admitting: Family Medicine

## 2021-10-26 ENCOUNTER — Other Ambulatory Visit: Payer: Self-pay | Admitting: Family Medicine

## 2021-10-29 DIAGNOSIS — Z20822 Contact with and (suspected) exposure to covid-19: Secondary | ICD-10-CM | POA: Diagnosis not present

## 2021-10-29 MED ORDER — HYDROXYZINE HCL 25 MG PO TABS: 25.0000 mg | ORAL_TABLET | Freq: Three times a day (TID) | ORAL | 1 refills | Status: DC | PRN

## 2021-10-29 NOTE — Addendum Note (Signed)
Addended by: Leone Haven on: 10/29/2021 09:18 AM ? ? Modules accepted: Orders ? ?

## 2021-10-31 ENCOUNTER — Other Ambulatory Visit: Payer: Self-pay | Admitting: Family Medicine

## 2021-11-06 ENCOUNTER — Telehealth: Payer: Self-pay | Admitting: Family Medicine

## 2021-11-06 NOTE — Telephone Encounter (Signed)
Patient is requesting a refill on her clonazePAM (KLONOPIN) 0.5 MG tablet, Patient stated that  she has '1mg'$  . Remaing that she could cut in half, but that is hard to do. Patient would like to know what Dr Caryl Bis wants her to do. ?

## 2021-11-06 NOTE — Telephone Encounter (Signed)
I called and spoke with the patient and informed her that the prescription was already at the pharmacy that all she needed to do was call in the refill, we were disconnected. I just wanted her to know to call in the refill.   Valary Manahan,cma  ?

## 2021-11-06 NOTE — Telephone Encounter (Signed)
There should be refills at the pharmacy for her already. Has she tried to call them for the klonopin 0.5 mg twice daily prescription? Once she has completed that prescription there is already another there for her to taper down to 0.25 mg twice daily as well.  ?

## 2021-11-14 DIAGNOSIS — M25511 Pain in right shoulder: Secondary | ICD-10-CM | POA: Diagnosis not present

## 2021-11-14 DIAGNOSIS — M25512 Pain in left shoulder: Secondary | ICD-10-CM | POA: Diagnosis not present

## 2021-11-26 NOTE — Telephone Encounter (Signed)
I called and spoke with the patient and informed her that since she has completed the 0.5 mg for 2 weeks now she is to start taking the .25 twice a day and she understood. Kanika Bungert,cma

## 2021-11-26 NOTE — Telephone Encounter (Signed)
Pt called wanting to talk to the cma about her weaning off of clonazepam

## 2021-12-14 ENCOUNTER — Telehealth: Payer: Self-pay | Admitting: Family Medicine

## 2021-12-14 DIAGNOSIS — N179 Acute kidney failure, unspecified: Secondary | ICD-10-CM

## 2021-12-14 DIAGNOSIS — E039 Hypothyroidism, unspecified: Secondary | ICD-10-CM

## 2021-12-18 ENCOUNTER — Other Ambulatory Visit (INDEPENDENT_AMBULATORY_CARE_PROVIDER_SITE_OTHER): Payer: Medicare Other

## 2021-12-18 DIAGNOSIS — N179 Acute kidney failure, unspecified: Secondary | ICD-10-CM | POA: Diagnosis not present

## 2021-12-18 DIAGNOSIS — E039 Hypothyroidism, unspecified: Secondary | ICD-10-CM

## 2021-12-18 LAB — BASIC METABOLIC PANEL
BUN: 28 mg/dL — ABNORMAL HIGH (ref 6–23)
CO2: 27 mEq/L (ref 19–32)
Calcium: 9.4 mg/dL (ref 8.4–10.5)
Chloride: 105 mEq/L (ref 96–112)
Creatinine, Ser: 0.89 mg/dL (ref 0.40–1.20)
GFR: 63.37 mL/min (ref >60.00)
Glucose, Bld: 107 mg/dL — ABNORMAL HIGH (ref 70–99)
Potassium: 3.8 mEq/L (ref 3.5–5.1)
Sodium: 142 mEq/L (ref 135–145)

## 2021-12-18 LAB — TSH: TSH: 4.05 u[IU]/mL (ref 0.35–5.50)

## 2021-12-20 DIAGNOSIS — C4441 Basal cell carcinoma of skin of scalp and neck: Secondary | ICD-10-CM | POA: Diagnosis not present

## 2021-12-20 DIAGNOSIS — Z79899 Other long term (current) drug therapy: Secondary | ICD-10-CM | POA: Diagnosis not present

## 2021-12-20 DIAGNOSIS — D485 Neoplasm of uncertain behavior of skin: Secondary | ICD-10-CM | POA: Diagnosis not present

## 2021-12-20 DIAGNOSIS — L578 Other skin changes due to chronic exposure to nonionizing radiation: Secondary | ICD-10-CM | POA: Diagnosis not present

## 2021-12-20 DIAGNOSIS — L821 Other seborrheic keratosis: Secondary | ICD-10-CM | POA: Diagnosis not present

## 2021-12-20 DIAGNOSIS — Z859 Personal history of malignant neoplasm, unspecified: Secondary | ICD-10-CM | POA: Diagnosis not present

## 2021-12-20 DIAGNOSIS — Z872 Personal history of diseases of the skin and subcutaneous tissue: Secondary | ICD-10-CM | POA: Diagnosis not present

## 2021-12-20 DIAGNOSIS — L718 Other rosacea: Secondary | ICD-10-CM | POA: Diagnosis not present

## 2021-12-20 DIAGNOSIS — L603 Nail dystrophy: Secondary | ICD-10-CM | POA: Diagnosis not present

## 2021-12-20 DIAGNOSIS — Z86018 Personal history of other benign neoplasm: Secondary | ICD-10-CM | POA: Diagnosis not present

## 2021-12-21 ENCOUNTER — Ambulatory Visit: Payer: Medicare Other | Admitting: Family Medicine

## 2021-12-24 ENCOUNTER — Ambulatory Visit (INDEPENDENT_AMBULATORY_CARE_PROVIDER_SITE_OTHER): Payer: Medicare Other | Admitting: Family Medicine

## 2021-12-24 ENCOUNTER — Encounter: Payer: Self-pay | Admitting: Family Medicine

## 2021-12-24 DIAGNOSIS — F32A Depression, unspecified: Secondary | ICD-10-CM

## 2021-12-24 DIAGNOSIS — F419 Anxiety disorder, unspecified: Secondary | ICD-10-CM

## 2021-12-24 DIAGNOSIS — J309 Allergic rhinitis, unspecified: Secondary | ICD-10-CM | POA: Insufficient documentation

## 2021-12-24 MED ORDER — CLONAZEPAM 0.5 MG PO TABS: 0.5000 mg | ORAL_TABLET | Freq: Two times a day (BID) | ORAL | 2 refills | Status: DC | PRN

## 2021-12-24 NOTE — Assessment & Plan Note (Signed)
I suspect the patient's symptoms are allergy related.  She can try generic Zyrtec and if that is not beneficial she can try over-the-counter Flonase.  She noted no history of glaucoma.  If those things are not beneficial she will let me know.

## 2021-12-24 NOTE — Patient Instructions (Signed)
Nice to see you. You can take clonazepam 0.5 mg twice daily as needed for anxiety.  If this is not beneficial please let me know. Please try generic Zyrtec or Flonase to see if that will help with your cough and postnasal drip.

## 2021-12-24 NOTE — Progress Notes (Signed)
Tommi Rumps, MD Phone: [phone]  Pam Salazar is a 76 y.o. female who presents today for f/u.  Anxiety/depression: Patient notes her anxiety has worsened as we have tapered her down on her clonazepam.  She was feeling depressed at times thinking about having to live with worsened anxiety.  She notes recently she has gone back up on her evening dose to 0.5 mg.  She has been taking 0.25 mg in the morning with 1 hydroxyzine.  She is on Zoloft 200 mg daily.  She notes no SI.  Cough: Patient notes a cough for the last 2 weeks.  She notes postnasal drip and some sore throat.  Some rhinorrhea.  Occasional sneezing.  No congestion.  No fevers.  No sick contacts.  No loss of taste or smell.  Social History   Tobacco Use  Smoking Status Former   Types: Cigarettes  Smokeless Tobacco Former   Quit date: 07/26/2013  Tobacco Comments        Current Outpatient Medications on File Prior to Visit  Medication Sig Dispense Refill   diphenhydramine-acetaminophen (TYLENOL PM) 25-500 MG TABS tablet Take 1 tablet by mouth at bedtime.     hydrOXYzine (ATARAX) 25 MG tablet Take 1 tablet (25 mg total) by mouth 3 (three) times daily as needed for anxiety. 90 tablet 1   levothyroxine (SYNTHROID) 125 MCG tablet TAKE ONE TABLET BY MOUTH DAILY BEFORE BREAKFAST 90 tablet 1   Multiple Vitamins-Minerals (PRESERVISION AREDS 2 PO) Take 1 capsule by mouth 2 (two) times daily.      sertraline (ZOLOFT) 100 MG tablet TAKE TWO TABLETS BY MOUTH DAILY 180 tablet 1   simvastatin (ZOCOR) 20 MG tablet TAKE ONE TABLET BY MOUTH EVERY EVENING 90 tablet 1   No current facility-administered medications on file prior to visit.     ROS see history of present illness  Objective  Physical Exam Vitals:   12/24/21 1211  BP: 120/72  Pulse: (!) 107  Temp: 97.9 F (36.6 C)  SpO2: 99%    BP Readings from Last 3 Encounters:  12/24/21 120/72  10/03/21 120/80  07/02/21 (!) 88/57   Wt Readings from Last 3  Encounters:  12/24/21 125 lb 6.4 oz (56.9 kg)  10/03/21 123 lb 6.4 oz (56 kg)  07/02/21 125 lb 12.8 oz (57.1 kg)    Physical Exam Constitutional:      General: She is not in acute distress.    Appearance: She is not diaphoretic.  HENT:     Right Ear: Tympanic membrane normal.     Left Ear: Tympanic membrane normal.     Ears:     Comments: Mild cerumen bilateral ear canals    Mouth/Throat:     Mouth: Mucous membranes are moist.     Pharynx: Oropharynx is clear.  Cardiovascular:     Rate and Rhythm: Normal rate and regular rhythm.     Heart sounds: Normal heart sounds.  Pulmonary:     Effort: Pulmonary effort is normal.     Breath sounds: Normal breath sounds.  Skin:    General: Skin is warm and dry.  Neurological:     Mental Status: She is alert.      Assessment/Plan: Please see individual problem list.  Problem List Items Addressed This Visit     Anxiety and depression (Chronic)    Worsened at this time.  We will have her go back to clonazepam 0.5 mg twice daily.  She will remain on this dose at this time.  I did discuss the risk of falls as well as dementia issues with long-term use of this medication.  Discussed that she is likely dependent on this given how long she has been taking this.  Discussed the risk of addiction to this medication.  Discussed there may be a time where we have to bring her off of this medication.  She will continue Zoloft 200 mg daily.  We will follow-up in 3 months.      Relevant Medications   clonazePAM (KLONOPIN) 0.5 MG tablet   Allergic rhinitis    I suspect the patient's symptoms are allergy related.  She can try generic Zyrtec and if that is not beneficial she can try over-the-counter Flonase.  She noted no history of glaucoma.  If those things are not beneficial she will let me know.        Return in about 3 months (around 03/26/2022) for Anxiety.   Tommi Rumps, MD [place]

## 2021-12-24 NOTE — Assessment & Plan Note (Signed)
Worsened at this time.  We will have her go back to clonazepam 0.5 mg twice daily.  She will remain on this dose at this time.  I did discuss the risk of falls as well as dementia issues with long-term use of this medication.  Discussed that she is likely dependent on this given how long she has been taking this.  Discussed the risk of addiction to this medication.  Discussed there may be a time where we have to bring her off of this medication.  She will continue Zoloft 200 mg daily.  We will follow-up in 3 months.

## 2021-12-27 DIAGNOSIS — J069 Acute upper respiratory infection, unspecified: Secondary | ICD-10-CM | POA: Diagnosis not present

## 2021-12-27 DIAGNOSIS — R590 Localized enlarged lymph nodes: Secondary | ICD-10-CM | POA: Diagnosis not present

## 2021-12-28 ENCOUNTER — Other Ambulatory Visit: Payer: Self-pay | Admitting: Family Medicine

## 2021-12-28 DIAGNOSIS — Z1231 Encounter for screening mammogram for malignant neoplasm of breast: Secondary | ICD-10-CM

## 2022-01-01 DIAGNOSIS — H353131 Nonexudative age-related macular degeneration, bilateral, early dry stage: Secondary | ICD-10-CM | POA: Diagnosis not present

## 2022-01-09 ENCOUNTER — Ambulatory Visit: Payer: Medicare Other | Admitting: Family

## 2022-01-14 ENCOUNTER — Encounter: Payer: Self-pay | Admitting: Family Medicine

## 2022-01-14 DIAGNOSIS — N3281 Overactive bladder: Secondary | ICD-10-CM

## 2022-01-17 NOTE — Telephone Encounter (Signed)
Pt called stating the referral appointment is not until October. Pt wants a referral to [facility]

## 2022-01-18 ENCOUNTER — Encounter: Payer: Self-pay | Admitting: Family Medicine

## 2022-01-18 NOTE — Addendum Note (Signed)
Addended by: Leone Haven on: 01/18/2022 10:54 AM   Modules accepted: Orders

## 2022-01-21 ENCOUNTER — Encounter: Payer: Self-pay | Admitting: Family Medicine

## 2022-01-22 NOTE — Telephone Encounter (Signed)
Patient needs a visit to re-evaluate this further and have a discussion on what type of imaging would be appropriate and if additional treatment is needed.

## 2022-01-28 ENCOUNTER — Ambulatory Visit (INDEPENDENT_AMBULATORY_CARE_PROVIDER_SITE_OTHER): Payer: Medicare Other | Admitting: Family Medicine

## 2022-01-28 ENCOUNTER — Encounter: Payer: Self-pay | Admitting: Family Medicine

## 2022-01-28 VITALS — BP 110/70 | HR 90 | Temp 98.2°F | Ht 62.0 in | Wt 126.2 lb

## 2022-01-28 DIAGNOSIS — R053 Chronic cough: Secondary | ICD-10-CM | POA: Insufficient documentation

## 2022-01-28 DIAGNOSIS — Z87891 Personal history of nicotine dependence: Secondary | ICD-10-CM | POA: Diagnosis not present

## 2022-01-28 DIAGNOSIS — R059 Cough, unspecified: Secondary | ICD-10-CM | POA: Insufficient documentation

## 2022-01-28 DIAGNOSIS — R052 Subacute cough: Secondary | ICD-10-CM | POA: Diagnosis not present

## 2022-01-28 NOTE — Assessment & Plan Note (Signed)
Patient is on ongoing cough that may be related to allergies though she does have a smoking history and has not had any lung imaging recently.  Discussed given her smoking history it would be reasonable to get a CT scan if we can get her insurance approval for it.  Discussed the potential for needing a chest x-ray first and we will see if they will approve the CT scan.  She will also trial Flonase.  If not improving with that she will let us know.

## 2022-01-28 NOTE — Progress Notes (Signed)
Tommi Rumps, MD Phone: [phone]  Pam Salazar is a 76 y.o. female who presents today for follow-up.  Cough: Patient reports this was going on for several weeks prior to our last visit.  She was treated with Augmentin about a month ago and had no improvement.  She has noted a possible lymph node in her submandibular area.  She notes no sinus or chest congestion.  She has some congestion in her throat though is not able to cough anything up.  There is no hemoptysis.  No fever.  She has shortness of breath on 1 occasion when she is talking on the phone though otherwise has had no shortness of breath.  She notes no postnasal drip or sore throat.  Notes some fatigue.  Zyrtec has not been helpful with her cough though has helped with rhinorrhea.  Notes she was a half a pack a day smoker for about 50 years.  She quit about 10 years ago.  Social History   Tobacco Use  Smoking Status Former   Types: Cigarettes  Smokeless Tobacco Former   Quit date: 07/26/2013  Tobacco Comments        Current Outpatient Medications on File Prior to Visit  Medication Sig Dispense Refill   clonazePAM (KLONOPIN) 0.5 MG tablet Take 1 tablet (0.5 mg total) by mouth 2 (two) times daily as needed for anxiety. 60 tablet 2   diphenhydramine-acetaminophen (TYLENOL PM) 25-500 MG TABS tablet Take 1 tablet by mouth at bedtime.     hydrOXYzine (ATARAX) 25 MG tablet Take 1 tablet (25 mg total) by mouth 3 (three) times daily as needed for anxiety. 90 tablet 1   levothyroxine (SYNTHROID) 125 MCG tablet TAKE ONE TABLET BY MOUTH DAILY BEFORE BREAKFAST 90 tablet 1   Multiple Vitamins-Minerals (PRESERVISION AREDS 2 PO) Take 1 capsule by mouth 2 (two) times daily.      sertraline (ZOLOFT) 100 MG tablet TAKE TWO TABLETS BY MOUTH DAILY 180 tablet 1   simvastatin (ZOCOR) 20 MG tablet TAKE ONE TABLET BY MOUTH EVERY EVENING 90 tablet 1   No current facility-administered medications on file prior to visit.     ROS see  history of present illness  Objective  Physical Exam Vitals:   01/28/22 1342  BP: 110/70  Pulse: 90  Temp: 98.2 F (36.8 C)  SpO2: 97%    BP Readings from Last 3 Encounters:  01/28/22 110/70  12/24/21 120/72  10/03/21 120/80   Wt Readings from Last 3 Encounters:  01/28/22 126 lb 3.2 oz (57.2 kg)  12/24/21 125 lb 6.4 oz (56.9 kg)  10/03/21 123 lb 6.4 oz (56 kg)    Physical Exam Constitutional:      General: She is not in acute distress.    Appearance: She is not diaphoretic.  HENT:     Right Ear: Tympanic membrane normal.     Left Ear: Tympanic membrane normal.     Mouth/Throat:     Mouth: Mucous membranes are moist.     Pharynx: Oropharynx is clear.  Cardiovascular:     Rate and Rhythm: Normal rate and regular rhythm.     Heart sounds: Normal heart sounds.  Pulmonary:     Effort: Pulmonary effort is normal.     Breath sounds: Normal breath sounds.  Lymphadenopathy:     Cervical: No cervical adenopathy.  Skin:    General: Skin is warm and dry.  Neurological:     Mental Status: She is alert.      Assessment/Plan:  Please see individual problem list.  Problem List Items Addressed This Visit     Cough - Primary    Patient is on ongoing cough that may be related to allergies though she does have a smoking history and has not had any lung imaging recently.  Discussed given her smoking history it would be reasonable to get a CT scan if we can get her insurance approval for it.  Discussed the potential for needing a chest x-ray first and we will see if they will approve the CT scan.  She will also trial Flonase.  If not improving with that she will let us know.      Relevant Orders   CT Chest Wo Contrast   Other Visit Diagnoses     Former smoker       Relevant Orders   CT Chest Wo Contrast        No follow-ups on file.   Tommi Rumps, MD [place]

## 2022-01-28 NOTE — Patient Instructions (Signed)
Nice to see you. Please try the Flonase to see if that is helpful. Somebody should contact you to try to set up the CT scan.

## 2022-01-30 ENCOUNTER — Ambulatory Visit
Admission: RE | Admit: 2022-01-30 | Discharge: 2022-01-30 | Disposition: A | Discharge status: Home / Self Care | Payer: Medicare Other | Source: Ambulatory Visit | Attending: Family Medicine | Admitting: Family Medicine

## 2022-01-30 DIAGNOSIS — I700 Atherosclerosis of aorta: Secondary | ICD-10-CM | POA: Diagnosis not present

## 2022-01-30 DIAGNOSIS — J984 Other disorders of lung: Secondary | ICD-10-CM | POA: Diagnosis not present

## 2022-01-30 DIAGNOSIS — R052 Subacute cough: Secondary | ICD-10-CM | POA: Diagnosis not present

## 2022-01-30 DIAGNOSIS — Z87891 Personal history of nicotine dependence: Secondary | ICD-10-CM | POA: Diagnosis not present

## 2022-01-31 ENCOUNTER — Ambulatory Visit: Payer: Medicare Other | Attending: Family Medicine

## 2022-01-31 DIAGNOSIS — M6281 Muscle weakness (generalized): Secondary | ICD-10-CM | POA: Insufficient documentation

## 2022-01-31 DIAGNOSIS — N3281 Overactive bladder: Secondary | ICD-10-CM | POA: Diagnosis not present

## 2022-01-31 DIAGNOSIS — R278 Other lack of coordination: Secondary | ICD-10-CM | POA: Insufficient documentation

## 2022-01-31 DIAGNOSIS — M6289 Other specified disorders of muscle: Secondary | ICD-10-CM | POA: Insufficient documentation

## 2022-01-31 NOTE — Therapy (Signed)
OUTPATIENT PHYSICAL THERAPY FEMALE PELVIC EVALUATION   Patient Name: Pam Salazar MRN: [id number] DOB:11-06-1945, 76 y.o., female Today's Date: 01/31/2022   PT End of Session - 01/31/22 1309     Visit Number 1    Number of Visits 12    Date for PT Re-Evaluation 04/25/22    Authorization Type IE: 01/31/22    PT Start Time 1315    PT Stop Time 1355    PT Time Calculation (min) 40 min    Activity Tolerance Patient tolerated treatment well             Past Medical History:  Diagnosis Date   Arthritis    hips, lower back   Breast lump 10/21/2011   Overview:  Negative diagnostic mammogram 09/2011    Depression    Fracture of rib of right side 08/17/2013   Hypercholesterolemia    Hypothyroidism    Low blood pressure 10/25/2014   Panic disorder    previous agoraphobia   Seizures (HCC)    febrile - as infant   Shoulder pain, left    bursa   Vertigo    positional - rare   Vitamin D deficiency    Past Surgical History:  Procedure Laterality Date   ABDOMINAL HYSTERECTOMY  1981   partial - secondary to bleeding, ovaries not removed -  vaginal   CATARACT EXTRACTION W/ INTRAOCULAR LENS  IMPLANT, BILATERAL     COLONOSCOPY WITH PROPOFOL N/A 05/05/2015   Procedure: COLONOSCOPY WITH PROPOFOL;  Surgeon: Lucilla Lame, MD;  Location: [facility];  Service: Endoscopy;  Laterality: N/A;   COLONOSCOPY WITH PROPOFOL N/A 06/26/2021   Procedure: COLONOSCOPY WITH PROPOFOL;  Surgeon: Jonathon Bellows, MD;  Location: [facility] ENDOSCOPY;  Service: Gastroenterology;  Laterality: N/A;   MASS EXCISION Right 12/24/2017   Procedure: EXCISION MASS ( SHIN );  Surgeon: Katha Cabal, MD;  Location: [facility];  Service: Vascular;  Laterality: Right;   ORIF FIBULA FRACTURE Right 2003   right lower extremitiy   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   shoulder Left    bone suprs   SHOULDER SURGERY  12/2015   TONSILLECTOMY  1974   Patient Active Problem List   Diagnosis Date Noted   Cough  01/28/2022   Allergic rhinitis 12/24/2021   Costochondritis 10/03/2021   Grief 03/21/2021   BPPV (benign paroxysmal positional vertigo), right 12/08/2020   Primary osteoarthritis of both hips 08/10/2020   Balance problem 06/05/2020   Left hip pain 05/04/2020   Trochanteric bursitis of left hip 05/04/2020   Vaginal discharge 08/26/2019   Urine incontinence 01/15/2019   Arthritis 01/15/2019   Prediabetes 01/15/2019   Postherpetic neuralgia 04/22/2018   Menopausal vaginal dryness 05/09/2017   Dyspareunia in female 05/09/2017   Angiodysplasia of intestine with hemorrhage    Idiopathic colitis    Cyst of soft tissue 01/14/2014   History of colonic polyps 03/11/2013   Anxiety and depression 03/07/2013   Panic disorder 03/07/2013   Hypercholesterolemia 03/07/2013   Hypothyroidism 03/07/2013   Skin cancer 03/07/2013   Nicotine dependence 06/10/2012    PCP: Leone Haven, MD   REFERRING PROVIDER: Leone Haven, MD   REFERRING DIAG:  N32.81 (ICD-10-CM) - OAB (overactive bladder)   THERAPY DIAG:  Pelvic floor dysfunction  Other lack of coordination  Muscle weakness (generalized)  Rationale for Evaluation and Treatment: Rehabilitation  ONSET DATE:   RED FLAGS: N/A Have you had any night sweats? Unexplained weight loss? Saddle anesthesia? Unexplained changes in bowel  or bladder habits?   SUBJECTIVE: Patient confirms identification and approves PT to assess pelvic floor and treatment Yes                                                                                                                                                                                           PRECAUTIONS: None  WEIGHT BEARING RESTRICTIONS: No  FALLS:  Has patient fallen in last 6 months? 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 Pt  notices urinary leakage after waking up in the morning if she does not wake up in the night to get up. Pt is currently wearing pantyliners and feels a sense of urgency. Pt does not feel she is leaking every day.    PAIN:  Are you having pain? No NPRS scale: 0/10   LIVING ENVIRONMENT: Lives with: lives with their spouse Lives in: House/apartment    PATIENT GOALS: Will ask next session 2/2 time constraints    UROLOGICAL HISTORY Fluid intake: Water mostly  Pain with urination: Yes after intercourse Fully empty bladder: No, before going to bed Toileting posture: feet flat  Stream: Strong   Urgency: Yes: walking to bathroom Frequency: every 3 hrs, hx of holding back urine  Nocturia: 0-2x on average  Leakage: Walking to the bathroom Pads: Yes Type: Pantyliners  Amount: 1x/day   Bladder control (0-10): 4/10  GASTROINTESTINAL HISTORY Pt has no concerns Pain with bowel movement: No   SEXUAL HISTORY/FUNCTION Pain with intercourse: Initial Penetration and During Penetration Ability to have vaginal penetration: Yes but with pain; Deep thrusting: Yes but with pain Able to achieve orgasm?: No   OBSTETRICAL HISTORY Vaginal deliveries: G3P2 Tearing: episiotomy with both    GYNECOLOGICAL HISTORY Hysterectomy: yes, abdominal hysterectomy  Pelvic Organ Prolapse: no Pain with exam: yes no Heaviness/pressure: yes no   OBJECTIVE:    COGNITION: Overall cognitive status: Within functional limits for tasks assessed     POSTURE:  Grossly crossed legs in sitting with B adducted hips  Lumbar lordosis:   Thoracic kyphosis: Deferred 2/2 time constraints  Iliac crest height:  Lumbar lateral shift:  Pelvic obliquity:  Leg length discrepancy:   GAIT: Deferred 2/2 time constraints Distance walked:  Trendelenburg:   SENSATION: Deferred 2/2 time constraints Light touch: , L2-S2 dermatomes  Proprioception:    RANGE OF MOTION:  Deferred 2/2 time constraints  (Norm range in  degrees)  LEFT  RIGHT   Lumbar forward flexion (65):      Lumbar extension (30):     Lumbar  lateral flexion (25):     Thoracic and Lumbar rotation (30 degrees):       Hip Flexion (0-125):      Hip IR (0-45):     Hip ER (0-45):     Hip Adduction:      Hip Abduction (0-40):     Hip extension (0-15):     (*= pain, Blank rows = not tested)   STRENGTH: MMT  Deferred 2/2 time constraints  RLE  LLE   Hip Flexion    Hip Extension    Hip Abduction     Hip Adduction     Hip ER     Hip IR     Knee Extension    Knee Flexion    Dorsiflexion     Plantarflexion (seated)    (*= pain, Blank rows = not tested)   SPECIAL TESTS: Deferred 2/2 time constraints Centralization and Peripheralization (SN 92, -LR 0.12):  Slump (SN 83, -LR 0.32):  SLR (SN 92, -LR 0.29): R: Lumbar quadrant (SN 70): R:  FABER (SN 81): FADIR (SN 94):  Hip scour (SN 50):  Thigh Thrust (SN 88, -LR 0.18) : Distraction (AL93):  Compression (SN/SP 69): Stork/March (SP 93):   PHYSICAL PERFORMANCE MEASURES: Deferred 2/2 time constraints  STS:  RLE SLS:  LLE SLS:  6 MWT:  10MWT:  5TSTS:   PALPATION: Deferred 2/2 time constraints Abdominal:  Diastasis:  finger above umbilicus,  fingers at and below umbilicus  Scar mobility: present/mobile perpendicular, parallel Rib flare: present/absent  EXTERNAL PELVIC EXAM: Patient educated on the purpose of the pelvic exam and articulated understanding; patient consented to the exam verbally. Deferred 2/2 time constraints Palpation: Breath coordination: present/absent/inconsistent Voluntary Contraction: present/absent Relaxation: full/delayed/non-relaxing Perineal movement with sustained IAP increase ("bear down"): descent/no change/elevation/excessive descent Perineal movement with rapid IAP increase ("cough"): elevation/no change/descent Pubic symphysis: (0= no contraction, 1= flicker, 2= weak squeeze, 3= fair squeeze with lift, 4= good squeeze and lift against  resistance, 5= strong squeeze against strong resistance)   INTERNAL PELVIC EXAM: Patient educated on the purpose of the pelvic exam and articulated understanding; patient consented to the exam verbally. Deferred 2/2 to time constraints Introitus Appears:  Skin integrity:  Scar mobility: Strength (PERF):  Symmetry: Palpation: Prolapse: (0= no contraction, 1= flicker, 2= weak squeeze, 3= fair squeeze with lift, 4= good squeeze and lift against resistance, 5= strong squeeze against strong resistance)    Patient Education:  Patient educated on what to expect during course of physical therapy, POC, and provided with HEP including: vaginal moisturizers handout and to fill out bladder diary via app. Patient verbalized understanding and returned demonstration. Patient will benefit from further education in order to maximize compliance and understanding for long-term therapeutic gains.   Patient Surveys:  FOTO Urinary Problem - 54     ASSESSMENT:  Clinical Impression: Patient is a 76 y.o. who was seen today for physical therapy evaluation and treatment for a chief concern of urinary urgency/leakage. Today's evaluation suggest deficits in PFM coordination, PFM endurance, PFM extensibility, posture, pain, scar mobility, and gait as evidenced by observed Trendelenburg walking from waiting room to treatment room, crossed legs in sitting with B adducted hips, urinary urgency, urinary leakage when walking to the bathroom, use of pantyliners, pain with penetrative sex 10/10 (NPRS), pain with urination, occasional feeling of incomplete bladder emptying, and hx of two episiotomies as well as a hysterectomy. Patient's responses on FOTO Urinary Problem (54) indicates moderate limitation/disability/distress. Patient's progress may be limited due to  time since onset and aged related changes; however, patient's motivation is advantageous. Pt with basic understanding of PFM function in bowel/bladder habits, sexual  function, posture, and breathing mechanics. Patient will benefit from skilled therapeutic intervention to address deficits in PFM coordination, PFM endurance, PFM extensibility, posture, pain, scar mobility, and gait in order to increase PLOF and improve overall QOL.    Objective Impairments: decreased coordination, decreased endurance, decreased strength, increased fascial restrictions, increased muscle spasms, improper body mechanics, and postural dysfunction.   Activity Limitations: standing, continence, toileting, and locomotion level  Personal Factors: Age, Behavior pattern, Past/current experiences, Time since onset of injury/illness/exacerbation, and 1-2 comorbidities: insomnia and anxiety/depression  are also affecting patient's functional outcome.   Rehab Potential: Good  Clinical Decision Making: Evolving/moderate complexity  Evaluation Complexity: Moderate   GOALS: Goals reviewed with patient? Yes  SHORT TERM GOALS: Target date: 03/14/2022  Patient will demonstrate independence with HEP in order to maximize therapeutic gains and improve carryover from physical therapy sessions to ADLs in the home and community. Baseline: bladder diary app and vaginal moisturizer handout  Goal status: INITIAL    LONG TERM GOALS: Target date: 04/25/2022   Patient will score  >/= 61 on FOTO Urinary Problem  in order to demonstrate decreased pain, improved PFM coordination, improved breathing mechanics and overall QOL.  Baseline: 54 Goal status: INITIAL  2.  Patient will report confidence in ability to control bladder > 7/10 in order to demonstrate improved function and ability to participate more fully in activities at home and in the community. Baseline: 4/10  Goal status: INITIAL  3.  Patient will report decreased reliance on protective undergarments as indicated by a 24 hour period to demonstrate improved bladder control and allow for increased participation in activities outside of the  home. Baseline: pantyliner 1x usually but on trips up to 2x/day Goal status: INITIAL  4.  Patient will demonstrate coordinated lengthening and relaxation of PFM with diaphragmatic inhalation in order to decrease spasm and allow for unrestricted elimination of urine/feces for improved overall QOL. Baseline: will assess next visit  Goal status: INITIAL  5.  Patient will demonstrate circumferential and sequential contraction of >3/5 MMT, > 5 sec hold x5 and 5 consecutive quick flicks with </= 10 min rest between testing bouts, and relaxation of the PFM coordinated with breath for improved management of intra-abdominal pressure and normal bowel and bladder function without the presence of pain nor incontinence in order to improve participation at home and in the community. Baseline: will assess next visit  Goal status: INITIAL   PLAN: PT Frequency: 1x/week  PT Duration: 12 weeks  Planned Interventions: Therapeutic exercises, Therapeutic activity, Neuromuscular re-education, Balance training, Gait training, Patient/Family education, Self Care, Joint mobilization, Spinal mobilization, Cryotherapy, Moist heat, scar mobilization, Taping, and Manual therapy  Plan For Next Session: phys assess   Taralee Marcus, PT, DPT  01/31/2022, 2:01 PM

## 2022-02-04 ENCOUNTER — Ambulatory Visit
Admission: RE | Admit: 2022-02-04 | Discharge: 2022-02-04 | Disposition: A | Discharge status: Home / Self Care | Payer: Medicare Other | Source: Ambulatory Visit | Attending: Family Medicine | Admitting: Family Medicine

## 2022-02-04 DIAGNOSIS — Z1231 Encounter for screening mammogram for malignant neoplasm of breast: Secondary | ICD-10-CM | POA: Insufficient documentation

## 2022-02-05 ENCOUNTER — Ambulatory Visit: Payer: Medicare Other

## 2022-02-05 DIAGNOSIS — R278 Other lack of coordination: Secondary | ICD-10-CM

## 2022-02-05 DIAGNOSIS — M6281 Muscle weakness (generalized): Secondary | ICD-10-CM

## 2022-02-05 DIAGNOSIS — N3281 Overactive bladder: Secondary | ICD-10-CM | POA: Diagnosis not present

## 2022-02-05 DIAGNOSIS — M6289 Other specified disorders of muscle: Secondary | ICD-10-CM | POA: Diagnosis not present

## 2022-02-05 NOTE — Therapy (Signed)
OUTPATIENT PHYSICAL THERAPY FEMALE PELVIC TREATMENT   Patient Name: Pam Salazar MRN: [id number] DOB:01-25-1946, 76 y.o., female Today's Date: 02/05/2022   PT End of Session - 02/05/22 1305     Visit Number 2    Number of Visits 12    Date for PT Re-Evaluation 04/25/22    Authorization Type IE: 01/31/22    PT Start Time 1310    PT Stop Time 1350    PT Time Calculation (min) 40 min    Activity Tolerance Patient tolerated treatment well             Past Medical History:  Diagnosis Date   Arthritis    hips, lower back   Breast lump 10/21/2011   Overview:  Negative diagnostic mammogram 09/2011    Depression    Fracture of rib of right side 08/17/2013   Hypercholesterolemia    Hypothyroidism    Low blood pressure 10/25/2014   Panic disorder    previous agoraphobia   Seizures (HCC)    febrile - as infant   Shoulder pain, left    bursa   Vertigo    positional - rare   Vitamin D deficiency    Past Surgical History:  Procedure Laterality Date   ABDOMINAL HYSTERECTOMY  1981   partial - secondary to bleeding, ovaries not removed -  vaginal   CATARACT EXTRACTION W/ INTRAOCULAR LENS  IMPLANT, BILATERAL     COLONOSCOPY WITH PROPOFOL N/A 05/05/2015   Procedure: COLONOSCOPY WITH PROPOFOL;  Surgeon: Lucilla Lame, MD;  Location: [place];  Service: Endoscopy;  Laterality: N/A;   COLONOSCOPY WITH PROPOFOL N/A 06/26/2021   Procedure: COLONOSCOPY WITH PROPOFOL;  Surgeon: Jonathon Bellows, MD;  Location: [facility];  Service: Gastroenterology;  Laterality: N/A;   MASS EXCISION Right 12/24/2017   Procedure: EXCISION MASS ( SHIN );  Surgeon: Katha Cabal, MD;  Location: [facility];  Service: Vascular;  Laterality: Right;   ORIF FIBULA FRACTURE Right 2003   right lower extremitiy   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   shoulder Left    bone suprs   SHOULDER SURGERY  12/2015   TONSILLECTOMY  1974   Patient Active Problem List   Diagnosis Date Noted   Cough  01/28/2022   Allergic rhinitis 12/24/2021   Costochondritis 10/03/2021   Grief 03/21/2021   BPPV (benign paroxysmal positional vertigo), right 12/08/2020   Primary osteoarthritis of both hips 08/10/2020   Balance problem 06/05/2020   Left hip pain 05/04/2020   Trochanteric bursitis of left hip 05/04/2020   Vaginal discharge 08/26/2019   Urine incontinence 01/15/2019   Arthritis 01/15/2019   Prediabetes 01/15/2019   Postherpetic neuralgia 04/22/2018   Menopausal vaginal dryness 05/09/2017   Dyspareunia in female 05/09/2017   Angiodysplasia of intestine with hemorrhage    Idiopathic colitis    Cyst of soft tissue 01/14/2014   History of colonic polyps 03/11/2013   Anxiety and depression 03/07/2013   Panic disorder 03/07/2013   Hypercholesterolemia 03/07/2013   Hypothyroidism 03/07/2013   Skin cancer 03/07/2013   Nicotine dependence 06/10/2012    PCP: Leone Haven, MD   REFERRING PROVIDER: Leone Haven, MD   REFERRING DIAG:  N32.81 (ICD-10-CM) - OAB (overactive bladder)   THERAPY DIAG:  Pelvic floor dysfunction  Other lack of coordination  Muscle weakness (generalized)  Rationale for Evaluation and Treatment: Rehabilitation  ONSET DATE:  PRECAUTIONS: None  WEIGHT BEARING RESTRICTIONS: No  FALLS:  Has patient fallen in last 6 months? 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 Pt notices urinary leakage after waking up in the morning if she does not wake up in the night to get up. Pt is currently wearing pantyliners and feels a sense of urgency. Pt does not feel she is leaking every day.    LIVING ENVIRONMENT: Lives with:  lives with their spouse Lives in: House/apartment    PATIENT GOALS: Pt would like not to have leakage     UROLOGICAL HISTORY Fluid intake: Water mostly  Pain with urination: Yes after intercourse Fully empty bladder: No, before going to bed Toileting posture: feet flat  Stream: Strong   Urgency: Yes: walking to bathroom Frequency: every 3 hrs, hx of holding back urine   Nocturia: 0-2x on average  Leakage: Walking to the bathroom Pads: Yes Type: Pantyliners  Amount: 1x/day   Bladder control (0-10): 4/10  GASTROINTESTINAL HISTORY Pt has no concerns Pain with bowel movement: No   SEXUAL HISTORY/FUNCTION Pain with intercourse: Initial Penetration and During Penetration Ability to have vaginal penetration: Yes but with pain; Deep thrusting: Yes but with pain Able to achieve orgasm?: No   OBSTETRICAL HISTORY Vaginal deliveries: G3P2 Tearing: episiotomy with both    GYNECOLOGICAL HISTORY Hysterectomy: yes, abdominal hysterectomy  Pelvic Organ Prolapse: no Pain with exam: yes no Heaviness/pressure: yes no  SUBJECTIVE:  Pt has felt she did not have urinary leakage this past week.    PAIN:  Are you having pain? No NPRS scale: 0/10   TODAY'S TREATMENT  Neuromuscular Re-education  Pre-treatment assessment:  OBJECTIVE:    COGNITION: Overall cognitive status: Within functional limits for tasks assessed     POSTURE:  Thoracic kyphosis: slight in standing Iliac crest height: L iliac crest higher  Pelvic obliquity: WNL   RANGE OF MOTION:   (Norm range in degrees)  LEFT 02/05/22 RIGHT 02/05/22  Lumbar forward flexion (65):  WNL    Lumbar extension (30): WNL    Lumbar lateral flexion (25):  Restricted (to knee) WNL (past knee)  Thoracic and Lumbar rotation (30 degrees):    Restricted  WNL  Hip Flexion (0-125):   WNL WNL  Hip IR (0-45):  WNL WNL  Hip ER (0-45):  WNL WNL  Hip Adduction:      Hip Abduction (0-40):  WNL WNL  Hip extension (0-15):     (*=  pain, Blank rows = not tested)   STRENGTH: MMT    RLE 02/05/22 LLE 02/05/22  Hip Flexion 5 4   Hip Extension 4 4  Hip Abduction     Hip Adduction     Hip ER  5 4*  Hip IR  5 4  Knee Extension 5 4*  Knee Flexion 5 5  Dorsiflexion     Plantarflexion (seated) 5 5  (*= fasciculations, Blank rows = not tested)   SPECIAL TESTS:   FABER (SN 81): negative B FADIR (SN 94): positive on R   PALPATION:  Abdominal:  Diastasis: none Rib flare: present B, significant  EXTERNAL PELVIC EXAM: Patient educated on the purpose of the pelvic exam and articulated understanding; patient consented to the exam verbally.  Breath coordination: inconsistent Voluntary Contraction: present, 2/5 MMT, cueing required to decrease adductor compensation, Valsalva noted Relaxation: delayed Perineal movement with sustained IAP increase ("bear down"):  no change Perineal movement with rapid IAP increase ("cough"): no change (0= no contraction, 1= flicker, 2= weak squeeze, 3= fair squeeze with lift, 4= good squeeze and lift against resistance, 5= strong squeeze against strong resistance)    Neuromuscular Re-education: Review of bladder diary: Pt is going about 6-7x/day, DPT emphasized that is normal for Pt's age  No leakage noted this week - discussion on relation between stress/anxiety and PFM coordination/bodily tension and the sympathetic nervous system  Supine hooklying diaphragmatic breathing with VCs and TCs for downregulation of the nervous system and improved management of IAP  Supine hooklying PFM lengthening techniques with diaphragmatic breathing, VCs and TCs as needed             Butterfly pose "adductor stretch"   Discussion and demonstration on log roll technique for improved IAP management   Patient response to interventions: Pt feels that her inner thigh muscles need stretching   Patient Education:  Patient provided with HEP including: supine and seated diaphragmatic breathing, supine  adductor pose. Patient educated throughout session on appropriate technique and form using multi-modal cueing, HEP, and activity modification. Patient will benefit from further education in order to maximize compliance and understanding for long-term therapeutic gains.   ASSESSMENT:  Clinical Impression: Patient presents to clinic with excellent motivation to participate in today's session. Upon physical examination, Pt demonstrates deficits in IAP management, PFM coordination, PFM extensibility, posture, LE strength, ROM, pain and scar mobility as evidenced by increased L iliac crest height, restricted ROM in L lateral flexion and thoracic rotation, 4/5 MMT with B hip ext, 4/5 MMT with L hip flex/ext/ER/IR/knee ext with muscular fasciculations noted, + FADDIR on R, significant B rib flare, inconsistent breath coordination, 2/5 MMT of PFM with noted Valsalva and adductor compensation, delayed PFM relaxation, and no change with sustained IAP increase ("bear down"). Pt required moderate Vcs and Tcs for proper diaphragmatic breathing to allow natural expansion of abdominal cavity. Discussion on relationship between PFM and the diaphragm. Pt verbalized understanding. Pt responded well to active and educational interventions. Patient will benefit from skilled therapeutic intervention to address deficits in IAP management, PFM coordination, PFM extensibility, posture, LE strength, ROM, and scar mobility in order to increase PLOF and improve overall QOL.    Objective Impairments: decreased coordination, decreased endurance, decreased strength, increased fascial restrictions, increased muscle spasms, improper body mechanics, and postural dysfunction.   Activity Limitations: standing, continence, toileting, and locomotion level  Personal Factors: Age, Behavior pattern, Past/current experiences, Time since onset of injury/illness/exacerbation, and 1-2 comorbidities: insomnia and anxiety/depression  are also  affecting patient's functional outcome.   Rehab Potential: Good  Clinical Decision Making: Evolving/moderate complexity  Evaluation Complexity: Moderate   GOALS: Goals reviewed with patient? Yes  SHORT TERM GOALS: Target date: 03/19/2022  Patient will demonstrate independence with HEP in order to maximize therapeutic gains and improve carryover from physical therapy sessions to ADLs in the home and community. Baseline: bladder diary app and vaginal moisturizer handout  Goal status: INITIAL    LONG TERM GOALS: Target date: 04/30/2022   Patient will score  >/= 61 on FOTO Urinary Problem  in order to demonstrate decreased pain, improved PFM coordination, improved breathing mechanics and overall QOL.  Baseline: 54 Goal status: INITIAL  2.  Patient will report confidence in ability to control bladder > 7/10 in order to demonstrate improved function and ability to participate more fully in activities at home and in the community. Baseline: 4/10  Goal status: INITIAL  3.  Patient will report decreased reliance on protective undergarments as indicated by a 24 hour period to demonstrate improved bladder control and allow for increased participation in activities outside of the home. Baseline: pantyliner 1x usually but on trips up to 2x/day Goal status: INITIAL  4.  Patient will demonstrate coordinated lengthening and relaxation of PFM with diaphragmatic inhalation in order to decrease spasm and allow for unrestricted elimination of urine/feces for improved overall QOL. Baseline: inconsistent breath coordination, delayed relaxation Goal status: INITIAL  5.  Patient will demonstrate circumferential and sequential contraction of >3/5 MMT, > 5 sec hold x5 and 5 consecutive quick flicks with </= 10 min rest between testing bouts, and relaxation of the PFM coordinated with breath for improved management of intra-abdominal pressure and normal bowel and bladder function without the presence of  pain nor incontinence in order to improve participation at home and in the community. Baseline: 2/5 MMT with noted spasm of PFM/no quick flicks tested Goal status: INITIAL   PLAN: PT Frequency: 1x/week  PT Duration: 12 weeks  Planned Interventions: Therapeutic exercises, Therapeutic activity, Neuromuscular re-education, Balance training, Gait training, Patient/Family education, Self Care, Joint mobilization, Spinal mobilization, Cryotherapy, Moist heat, scar mobilization, Taping, and Manual therapy  Plan For Next Session: thoracic rotation, PFM lengthening, manual on abdomen? Talk about scar massage   Advaith Lamarque, PT, DPT  02/05/2022, 1:05 PM

## 2022-02-11 DIAGNOSIS — L988 Other specified disorders of the skin and subcutaneous tissue: Secondary | ICD-10-CM | POA: Diagnosis not present

## 2022-02-11 DIAGNOSIS — C4491 Basal cell carcinoma of skin, unspecified: Secondary | ICD-10-CM | POA: Diagnosis not present

## 2022-02-14 ENCOUNTER — Encounter: Payer: Self-pay | Admitting: Family Medicine

## 2022-02-14 ENCOUNTER — Ambulatory Visit: Payer: Medicare Other

## 2022-02-14 DIAGNOSIS — R278 Other lack of coordination: Secondary | ICD-10-CM

## 2022-02-14 DIAGNOSIS — M6281 Muscle weakness (generalized): Secondary | ICD-10-CM | POA: Diagnosis not present

## 2022-02-14 DIAGNOSIS — N3281 Overactive bladder: Secondary | ICD-10-CM | POA: Diagnosis not present

## 2022-02-14 DIAGNOSIS — M6289 Other specified disorders of muscle: Secondary | ICD-10-CM | POA: Diagnosis not present

## 2022-02-14 NOTE — Therapy (Signed)
OUTPATIENT PHYSICAL THERAPY FEMALE PELVIC TREATMENT   Patient Name: Pam Salazar MRN: [id number] DOB:[birth year], 76 y.o., female Today's Date: 02/14/2022   PT End of Session - 02/14/22 1316     Visit Number 3    Number of Visits 12    Date for PT Re-Evaluation 04/25/22    Authorization Type IE: 01/31/22    PT Start Time 1315    PT Stop Time 1355    PT Time Calculation (min) 40 min    Activity Tolerance Patient tolerated treatment well             Past Medical History:  Diagnosis Date   Arthritis    hips, lower back   Breast lump 10/21/2011   Overview:  Negative diagnostic mammogram 09/2011    Depression    Fracture of rib of right side 08/17/2013   Hypercholesterolemia    Hypothyroidism    Low blood pressure 10/25/2014   Panic disorder    previous agoraphobia   Seizures (HCC)    febrile - as infant   Shoulder pain, left    bursa   Vertigo    positional - rare   Vitamin D deficiency    Past Surgical History:  Procedure Laterality Date   ABDOMINAL HYSTERECTOMY  1981   partial - secondary to bleeding, ovaries not removed -  vaginal   CATARACT EXTRACTION W/ INTRAOCULAR LENS  IMPLANT, BILATERAL     COLONOSCOPY WITH PROPOFOL N/A 05/05/2015   Procedure: COLONOSCOPY WITH PROPOFOL;  Surgeon: Lucilla Lame, MD;  Location: [place];  Service: Endoscopy;  Laterality: N/A;   COLONOSCOPY WITH PROPOFOL N/A 06/26/2021   Procedure: COLONOSCOPY WITH PROPOFOL;  Surgeon: Jonathon Bellows, MD;  Location: [facility];  Service: Gastroenterology;  Laterality: N/A;   MASS EXCISION Right 12/24/2017   Procedure: EXCISION MASS ( SHIN );  Surgeon: Katha Cabal, MD;  Location: [facility];  Service: Vascular;  Laterality: Right;   ORIF FIBULA FRACTURE Right 2003   right lower extremitiy   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   shoulder Left    bone suprs   SHOULDER SURGERY  12/2015   TONSILLECTOMY  1974   Patient Active Problem List   Diagnosis Date Noted   Cough  01/28/2022   Allergic rhinitis 12/24/2021   Costochondritis 10/03/2021   Grief 03/21/2021   BPPV (benign paroxysmal positional vertigo), right 12/08/2020   Primary osteoarthritis of both hips 08/10/2020   Balance problem 06/05/2020   Left hip pain 05/04/2020   Trochanteric bursitis of left hip 05/04/2020   Vaginal discharge 08/26/2019   Urine incontinence 01/15/2019   Arthritis 01/15/2019   Prediabetes 01/15/2019   Postherpetic neuralgia 04/22/2018   Menopausal vaginal dryness 05/09/2017   Dyspareunia in female 05/09/2017   Angiodysplasia of intestine with hemorrhage    Idiopathic colitis    Cyst of soft tissue 01/14/2014   History of colonic polyps 03/11/2013   Anxiety and depression 03/07/2013   Panic disorder 03/07/2013   Hypercholesterolemia 03/07/2013   Hypothyroidism 03/07/2013   Skin cancer 03/07/2013   Nicotine dependence 06/10/2012    PCP: Leone Haven, MD   REFERRING PROVIDER: Leone Haven, MD   REFERRING DIAG:  N32.81 (ICD-10-CM) - OAB (overactive bladder)   THERAPY DIAG:  Pelvic floor dysfunction  Other lack of coordination  Muscle weakness (generalized)  Rationale for Evaluation and Treatment: Rehabilitation  ONSET DATE:  PRECAUTIONS: None  WEIGHT BEARING RESTRICTIONS: No  FALLS:  Has patient fallen in last 6 months? 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 Pt notices urinary leakage after waking up in the morning if she does not wake up in the night to get up. Pt is currently wearing pantyliners and feels a sense of urgency. Pt does not feel she is leaking every day.    LIVING ENVIRONMENT: Lives with:  lives with their spouse Lives in: House/apartment    PATIENT GOALS: Pt would like not to have leakage     UROLOGICAL HISTORY Fluid intake: Water mostly  Pain with urination: Yes after intercourse Fully empty bladder: No, before going to bed Toileting posture: feet flat  Stream: Strong   Urgency: Yes: walking to bathroom Frequency: every 3 hrs, hx of holding back urine   Nocturia: 0-2x on average  Leakage: Walking to the bathroom Pads: Yes Type: Pantyliners  Amount: 1x/day   Bladder control (0-10): 4/10  GASTROINTESTINAL HISTORY Pt has no concerns Pain with bowel movement: No   SEXUAL HISTORY/FUNCTION Pain with intercourse: Initial Penetration and During Penetration Ability to have vaginal penetration: Yes but with pain; Deep thrusting: Yes but with pain Able to achieve orgasm?: No   OBSTETRICAL HISTORY Vaginal deliveries: G3P2 Tearing: episiotomy with both    GYNECOLOGICAL HISTORY Hysterectomy: yes, abdominal hysterectomy  Pelvic Organ Prolapse: no Pain with exam: yes no Heaviness/pressure: yes no  SUBJECTIVE:  Pt went on a trip this past weekend and was walking around a lot but did not notice any leakage. Pt recalls one incidence of leakage this past week.   PAIN:  Are you having pain? No NPRS scale: 0/10    OBJECTIVE:    COGNITION: Overall cognitive status: Within functional limits for tasks assessed     POSTURE:  Thoracic kyphosis: slight in standing Iliac crest height: L iliac crest higher  Pelvic obliquity: WNL   RANGE OF MOTION:   (Norm range in degrees)  LEFT 02/05/22 RIGHT 02/05/22  Lumbar forward flexion (65):  WNL    Lumbar extension (30): WNL    Lumbar lateral flexion (25):  Restricted (to knee) WNL (past knee)  Thoracic and Lumbar rotation (30 degrees):    Restricted  WNL  Hip Flexion (0-125):   WNL WNL  Hip IR (0-45):  WNL WNL  Hip ER (0-45):  WNL WNL  Hip Adduction:      Hip Abduction (0-40):  WNL WNL  Hip extension (0-15):      (*= pain, Blank rows = not tested)   STRENGTH: MMT    RLE 02/05/22 LLE 02/05/22  Hip Flexion 5 4   Hip Extension 4 4  Hip Abduction     Hip Adduction     Hip ER  5 4*  Hip IR  5 4  Knee Extension 5 4*  Knee Flexion 5 5  Dorsiflexion     Plantarflexion (seated) 5 5  (*= fasciculations, Blank rows = not tested)   SPECIAL TESTS:   FABER (SN 81): negative B FADIR (SN 94): positive on R   PALPATION:  Abdominal:  Diastasis: none Rib flare: present B, significant  EXTERNAL PELVIC EXAM: Patient educated on the purpose of the pelvic exam and articulated understanding; patient consented to the exam verbally.  Breath coordination: inconsistent Voluntary Contraction: present, 2/5 MMT, cueing required to decrease adductor compensation, Valsalva noted Relaxation: delayed  Perineal movement with sustained IAP increase ("bear down"): no change Perineal movement with rapid IAP increase ("cough"): no change (0= no contraction, 1= flicker, 2= weak squeeze, 3= fair squeeze with lift, 4= good squeeze and lift against resistance, 5= strong squeeze against strong resistance)   TODAY'S TREATMENT  Neuromuscular Re-education: Supine hooklying diaphragmatic breathing with VCs and TCs for downregulation of the nervous system and improved management of IAP  Supine hooklying PFM lengthening techniques with diaphragmatic breathing, VCs and TCs as needed              B Single knee to chest   Double knee to chest              "Happy baby" pose   Child's pose  Discussion on scar mobility massage as Pt has hx of two episiotomies and quite a bit of pain with penetrative sex. DPT demonstrated on anatomical model different directions to perform on and around scar as well as using a water-based lubricant or coconut/almond oil. Pt verbalized understanding.   Discussion on what an internal/external PFM exam would entail and how STM/TrP release with movement can aid in decreasing muscular tension and  increase tissue pliability/extensibility around the episiotomy scar. Pt verbalized understanding.   Patient response to interventions: Pt felt a good stretch in the inner thighs with child's pose   Patient Education:  Patient provided with HEP including: PFM lengthening techniques above. Patient educated throughout session on appropriate technique and form using multi-modal cueing, HEP, and activity modification. Patient will benefit from further education in order to maximize compliance and understanding for long-term therapeutic gains.   ASSESSMENT:  Clinical Impression: Patient presents to clinic with excellent motivation to participate in today's session. Pt continues to demonstrate deficits in IAP management, PFM coordination, PFM extensibility, posture, LE strength, ROM, pain and scar mobility. Pt reports minimal leakage this past week and was pleasantly surprised because she participated in increased activity out of town. Pt required moderate VCs and TCs for PFM lengthening techniques and modifications as needed due to R knee replacement. DPT demonstrated self scar massage technique and how that information would be helpful in the next session. Pt responded positively to active and educational interventions. Patient will benefit from skilled therapeutic intervention to address deficits in IAP management, PFM coordination, PFM extensibility, posture, LE strength, ROM, and scar mobility in order to increase PLOF and improve overall QOL.    Objective Impairments: decreased coordination, decreased endurance, decreased strength, increased fascial restrictions, increased muscle spasms, improper body mechanics, and postural dysfunction.   Activity Limitations: standing, continence, toileting, and locomotion level  Personal Factors: Age, Behavior pattern, Past/current experiences, Time since onset of injury/illness/exacerbation, and 1-2 comorbidities: insomnia and anxiety/depression  are also  affecting patient's functional outcome.   Rehab Potential: Good  Clinical Decision Making: Evolving/moderate complexity  Evaluation Complexity: Moderate   GOALS: Goals reviewed with patient? Yes  SHORT TERM GOALS: Target date: 03/28/22  Patient will demonstrate independence with HEP in order to maximize therapeutic gains and improve carryover from physical therapy sessions to ADLs in the home and community. Baseline: bladder diary app and vaginal moisturizer handout  Goal status: INITIAL    LONG TERM GOALS: Target date: 04/25/22   Patient will score  >/= 61 on FOTO Urinary Problem  in order to demonstrate decreased pain, improved PFM coordination, improved breathing mechanics and overall QOL.  Baseline: 54 Goal status: INITIAL  2.  Patient will report confidence in ability to control bladder > 7/10 in order  to demonstrate improved function and ability to participate more fully in activities at home and in the community. Baseline: 4/10  Goal status: INITIAL  3.  Patient will report decreased reliance on protective undergarments as indicated by a 24 hour period to demonstrate improved bladder control and allow for increased participation in activities outside of the home. Baseline: pantyliner 1x usually but on trips up to 2x/day Goal status: INITIAL  4.  Patient will demonstrate coordinated lengthening and relaxation of PFM with diaphragmatic inhalation in order to decrease spasm and allow for unrestricted elimination of urine/feces for improved overall QOL. Baseline: inconsistent breath coordination, delayed relaxation Goal status: INITIAL  5.  Patient will demonstrate circumferential and sequential contraction of >3/5 MMT, > 5 sec hold x5 and 5 consecutive quick flicks with </= 10 min rest between testing bouts, and relaxation of the PFM coordinated with breath for improved management of intra-abdominal pressure and normal bowel and bladder function without the presence of pain  nor incontinence in order to improve participation at home and in the community. Baseline: 2/5 MMT with noted spasm of PFM/no quick flicks tested Goal status: INITIAL   PLAN: PT Frequency: 1x/week  PT Duration: 12 weeks  Planned Interventions: Therapeutic exercises, Therapeutic activity, Neuromuscular re-education, Balance training, Gait training, Patient/Family education, Self Care, Joint mobilization, Spinal mobilization, Cryotherapy, Moist heat, scar mobilization, Taping, and Manual therapy  Plan For Next Session: give scar massage handout, thoracic rotation, PFM lengthening, manual on abdomen?    Mickie Kozikowski, PT, DPT  02/14/2022, 1:17 PM

## 2022-02-15 ENCOUNTER — Other Ambulatory Visit: Payer: Self-pay | Admitting: Family

## 2022-02-15 DIAGNOSIS — R490 Dysphonia: Secondary | ICD-10-CM

## 2022-02-19 ENCOUNTER — Other Ambulatory Visit: Payer: Self-pay

## 2022-02-19 DIAGNOSIS — E039 Hypothyroidism, unspecified: Secondary | ICD-10-CM

## 2022-02-19 DIAGNOSIS — E7800 Pure hypercholesterolemia, unspecified: Secondary | ICD-10-CM

## 2022-02-19 MED ORDER — LEVOTHYROXINE SODIUM 125 MCG PO TABS: ORAL_TABLET | ORAL | 1 refills | Status: DC

## 2022-02-19 MED ORDER — SIMVASTATIN 20 MG PO TABS: 20.0000 mg | ORAL_TABLET | Freq: Every evening | ORAL | 1 refills | Status: DC

## 2022-02-21 ENCOUNTER — Ambulatory Visit: Payer: Medicare Other

## 2022-02-28 ENCOUNTER — Ambulatory Visit: Payer: Medicare Other | Attending: Family Medicine

## 2022-02-28 DIAGNOSIS — R278 Other lack of coordination: Secondary | ICD-10-CM

## 2022-02-28 DIAGNOSIS — N941 Unspecified dyspareunia: Secondary | ICD-10-CM | POA: Insufficient documentation

## 2022-02-28 DIAGNOSIS — M6281 Muscle weakness (generalized): Secondary | ICD-10-CM | POA: Diagnosis not present

## 2022-02-28 DIAGNOSIS — M6289 Other specified disorders of muscle: Secondary | ICD-10-CM | POA: Diagnosis not present

## 2022-02-28 NOTE — Therapy (Signed)
OUTPATIENT PHYSICAL THERAPY FEMALE PELVIC TREATMENT   Patient Name: Pam Salazar MRN: [id number] DOB:[birth year], 76 y.o., female Today's Date: 02/28/2022   PT End of Session - 02/28/22 1311     Visit Number 4    Number of Visits 12    Date for PT Re-Evaluation 04/25/22    Authorization Type IE: 01/31/22    PT Start Time 1315    PT Stop Time 1355    PT Time Calculation (min) 40 min    Activity Tolerance Patient tolerated treatment well             Past Medical History:  Diagnosis Date   Arthritis    hips, lower back   Breast lump 10/21/2011   Overview:  Negative diagnostic mammogram 09/2011    Depression    Fracture of rib of right side 08/17/2013   Hypercholesterolemia    Hypothyroidism    Low blood pressure 10/25/2014   Panic disorder    previous agoraphobia   Seizures (HCC)    febrile - as infant   Shoulder pain, left    bursa   Vertigo    positional - rare   Vitamin D deficiency    Past Surgical History:  Procedure Laterality Date   ABDOMINAL HYSTERECTOMY  1981   partial - secondary to bleeding, ovaries not removed -  vaginal   CATARACT EXTRACTION W/ INTRAOCULAR LENS  IMPLANT, BILATERAL     COLONOSCOPY WITH PROPOFOL N/A 05/05/2015   Procedure: COLONOSCOPY WITH PROPOFOL;  Surgeon: Lucilla Lame, MD;  Location: [facility];  Service: Endoscopy;  Laterality: N/A;   COLONOSCOPY WITH PROPOFOL N/A 06/26/2021   Procedure: COLONOSCOPY WITH PROPOFOL;  Surgeon: Jonathon Bellows, MD;  Location: [facility];  Service: Gastroenterology;  Laterality: N/A;   MASS EXCISION Right 12/24/2017   Procedure: EXCISION MASS ( SHIN );  Surgeon: Katha Cabal, MD;  Location: [facility];  Service: Vascular;  Laterality: Right;   ORIF FIBULA FRACTURE Right 2003   right lower extremitiy   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   shoulder Left    bone suprs   SHOULDER SURGERY  12/2015   TONSILLECTOMY  1974   Patient Active Problem List   Diagnosis Date Noted   Cough  01/28/2022   Allergic rhinitis 12/24/2021   Costochondritis 10/03/2021   Grief 03/21/2021   BPPV (benign paroxysmal positional vertigo), right 12/08/2020   Primary osteoarthritis of both hips 08/10/2020   Balance problem 06/05/2020   Left hip pain 05/04/2020   Trochanteric bursitis of left hip 05/04/2020   Vaginal discharge 08/26/2019   Urine incontinence 01/15/2019   Arthritis 01/15/2019   Prediabetes 01/15/2019   Postherpetic neuralgia 04/22/2018   Menopausal vaginal dryness 05/09/2017   Dyspareunia in female 05/09/2017   Angiodysplasia of intestine with hemorrhage    Idiopathic colitis    Cyst of soft tissue 01/14/2014   History of colonic polyps 03/11/2013   Anxiety and depression 03/07/2013   Panic disorder 03/07/2013   Hypercholesterolemia 03/07/2013   Hypothyroidism 03/07/2013   Skin cancer 03/07/2013   Nicotine dependence 06/10/2012    PCP: Leone Haven, MD   REFERRING PROVIDER: Leone Haven, MD   REFERRING DIAG:  N32.81 (ICD-10-CM) - OAB (overactive bladder)   THERAPY DIAG:  Pelvic floor dysfunction  Other lack of coordination  Muscle weakness (generalized)  Rationale for Evaluation and Treatment: Rehabilitation  ONSET DATE:  PRECAUTIONS: None  WEIGHT BEARING RESTRICTIONS: No  FALLS:  Has patient fallen in last 6 months? 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 Pt notices urinary leakage after waking up in the morning if she does not wake up in the night to get up. Pt is currently wearing pantyliners and feels a sense of urgency. Pt does not feel she is leaking every day.    LIVING ENVIRONMENT: Lives with:  lives with their spouse Lives in: House/apartment    PATIENT GOALS: Pt would like not to have leakage     UROLOGICAL HISTORY Fluid intake: Water mostly  Pain with urination: Yes after intercourse Fully empty bladder: No, before going to bed Toileting posture: feet flat  Stream: Strong   Urgency: Yes: walking to bathroom Frequency: every 3 hrs, hx of holding back urine   Nocturia: 0-2x on average  Leakage: Walking to the bathroom Pads: Yes Type: Pantyliners  Amount: 1x/day   Bladder control (0-10): 4/10  GASTROINTESTINAL HISTORY Pt has no concerns Pain with bowel movement: No   SEXUAL HISTORY/FUNCTION Pain with intercourse: Initial Penetration and During Penetration Ability to have vaginal penetration: Yes but with pain; Deep thrusting: Yes but with pain Able to achieve orgasm?: No   OBSTETRICAL HISTORY Vaginal deliveries: G3P2 Tearing: episiotomy with both    GYNECOLOGICAL HISTORY Hysterectomy: yes, abdominal hysterectomy  Pelvic Organ Prolapse: no Pain with exam: yes no Heaviness/pressure: yes no  SUBJECTIVE:  Pt has noticed a few more urinary leakages over the past couple weeks more when walking to the bathroom. Pt also has had some external stressors as well.    PAIN:  Are you having pain? No NPRS scale: 0/10    OBJECTIVE:    COGNITION: Overall cognitive status: Within functional limits for tasks assessed     POSTURE:  Thoracic kyphosis: slight in standing Iliac crest height: L iliac crest higher  Pelvic obliquity: WNL   RANGE OF MOTION:   (Norm range in degrees)  LEFT 02/05/22 RIGHT 02/05/22  Lumbar forward flexion (65):  WNL    Lumbar extension (30): WNL    Lumbar lateral flexion (25):  Restricted (to knee) WNL (past knee)  Thoracic and Lumbar rotation (30 degrees):    Restricted  WNL  Hip Flexion (0-125):   WNL WNL  Hip IR (0-45):  WNL WNL  Hip ER (0-45):  WNL WNL  Hip Adduction:      Hip Abduction (0-40):  WNL WNL  Hip extension  (0-15):     (*= pain, Blank rows = not tested)   STRENGTH: MMT    RLE 02/05/22 LLE 02/05/22  Hip Flexion 5 4   Hip Extension 4 4  Hip Abduction     Hip Adduction     Hip ER  5 4*  Hip IR  5 4  Knee Extension 5 4*  Knee Flexion 5 5  Dorsiflexion     Plantarflexion (seated) 5 5  (*= fasciculations, Blank rows = not tested)   SPECIAL TESTS:   FABER (SN 81): negative B FADIR (SN 94): positive on R   PALPATION:  Abdominal:  Diastasis: none Rib flare: present B, significant  EXTERNAL PELVIC EXAM: Patient educated on the purpose of the pelvic exam and articulated understanding; patient consented to the exam verbally.  Breath coordination: inconsistent Voluntary Contraction: present, 2/5 MMT, cueing required to decrease adductor compensation, Valsalva noted Relaxation: delayed  Perineal movement with sustained IAP increase ("bear down"): no change Perineal movement with rapid IAP increase ("cough"): no change (0= no contraction, 1= flicker, 2= weak squeeze, 3= fair squeeze with lift, 4= good squeeze and lift against resistance, 5= strong squeeze against strong resistance)   TODAY'S TREATMENT  Neuromuscular Re-education: Supine hooklying diaphragmatic breathing with VCs and TCs for downregulation of the nervous system and improved management of IAP  Review of:  Supine hooklying PFM lengthening techniques with diaphragmatic breathing, VCs and TCs as needed              B Single knee to chest   Double knee to chest              "Happy baby" pose   Lateral trunk rotation with SB   Right sidelying thoracic rotations,x10, for improved lengthening of the anterior fascial slings   Discussion on how sympathetic nervous system and cortisol levels can impact PFM coordination and urinary leakage.   Discussion on urinary retention and how that can impact PFM coordination and urinary leakage. Also, lengthy discussion on decrease urinary urgency especially at night before going to bed.     Patient response to interventions: Pt enjoyed using the SB   Patient Education:  Patient provided with HEP including: LTR with SB and sidelying thoracic rotation.. Patient educated throughout session on appropriate technique and form using multi-modal cueing, HEP, and activity modification. Patient will benefit from further education in order to maximize compliance and understanding for long-term therapeutic gains.   ASSESSMENT:  Clinical Impression: Patient presents to clinic with excellent motivation to participate in today's session. Pt continues to demonstrate deficits in IAP management, PFM coordination, PFM extensibility, posture, LE strength, ROM, pain and scar mobility. Pt reports some increased external stressors and DPT had discussion about how cortisol levels can impact PFM coordination resulting in a noticeable difference in urinary leakage. Time taken to review PFM lengthening techniques and prioritizing diaphragmatic breathing to help downregulate nervous system. In addition how diaphragmatic breathing can help decrease urinary urgency particularly at night. Pt required moderate VCs and TCs for new active interventions (PFM lengthening). Pt responded positively to active and educational interventions. Patient will benefit from skilled therapeutic intervention to address deficits in IAP management, PFM coordination, PFM extensibility, posture, LE strength, ROM, and scar mobility in order to increase PLOF and improve overall QOL.    Objective Impairments: decreased coordination, decreased endurance, decreased strength, increased fascial restrictions, increased muscle spasms, improper body mechanics, and postural dysfunction.   Activity Limitations: standing, continence, toileting, and locomotion level  Personal Factors: Age, Behavior pattern, Past/current experiences, Time since onset of injury/illness/exacerbation, and 1-2 comorbidities: insomnia and anxiety/depression  are also  affecting patient's functional outcome.   Rehab Potential: Good  Clinical Decision Making: Evolving/moderate complexity  Evaluation Complexity: Moderate   GOALS: Goals reviewed with patient? Yes  SHORT TERM GOALS: Target date: 03/28/22  Patient will demonstrate independence with HEP in order to maximize therapeutic gains and improve carryover from physical therapy sessions to ADLs in the home and community. Baseline: bladder diary app and vaginal moisturizer handout  Goal status: INITIAL    LONG TERM GOALS: Target date: 04/25/22   Patient will score  >/= 61 on FOTO Urinary Problem  in order to demonstrate decreased pain, improved PFM coordination, improved breathing mechanics and overall QOL.  Baseline: 54 Goal status: INITIAL  2.  Patient will report confidence in ability to control bladder > 7/10 in order to demonstrate improved function and ability  to participate more fully in activities at home and in the community. Baseline: 4/10  Goal status: INITIAL  3.  Patient will report decreased reliance on protective undergarments as indicated by a 24 hour period to demonstrate improved bladder control and allow for increased participation in activities outside of the home. Baseline: pantyliner 1x usually but on trips up to 2x/day Goal status: INITIAL  4.  Patient will demonstrate coordinated lengthening and relaxation of PFM with diaphragmatic inhalation in order to decrease spasm and allow for unrestricted elimination of urine/feces for improved overall QOL. Baseline: inconsistent breath coordination, delayed relaxation Goal status: INITIAL  5.  Patient will demonstrate circumferential and sequential contraction of >3/5 MMT, > 5 sec hold x5 and 5 consecutive quick flicks with </= 10 min rest between testing bouts, and relaxation of the PFM coordinated with breath for improved management of intra-abdominal pressure and normal bowel and bladder function without the presence of pain  nor incontinence in order to improve participation at home and in the community. Baseline: 2/5 MMT with noted spasm of PFM/no quick flicks tested Goal status: INITIAL   PLAN: PT Frequency: 1x/week  PT Duration: 12 weeks  Planned Interventions: Therapeutic exercises, Therapeutic activity, Neuromuscular re-education, Balance training, Gait training, Patient/Family education, Self Care, Joint mobilization, Spinal mobilization, Cryotherapy, Moist heat, scar mobilization, Taping, and Manual therapy  Plan For Next Session: manual on abdomen, how did lengthen go?, scar massage technique? More with SB?   Christin Moline, PT, DPT  02/28/2022, 1:12 PM

## 2022-03-07 ENCOUNTER — Ambulatory Visit: Payer: Medicare Other

## 2022-03-07 DIAGNOSIS — M6281 Muscle weakness (generalized): Secondary | ICD-10-CM

## 2022-03-07 DIAGNOSIS — M6289 Other specified disorders of muscle: Secondary | ICD-10-CM

## 2022-03-07 DIAGNOSIS — R278 Other lack of coordination: Secondary | ICD-10-CM

## 2022-03-07 NOTE — Therapy (Signed)
OUTPATIENT PHYSICAL THERAPY FEMALE PELVIC TREATMENT   Patient Name: Pam Salazar MRN: [id number] DOB:Apr 07, 1946, 76 y.o., female Today's Date: 03/07/2022   PT End of Session - 03/07/22 1324     Visit Number 5    Number of Visits 12    Date for PT Re-Evaluation 04/25/22    Authorization Type IE: 01/31/22    PT Start Time 1325    PT Stop Time 1355    PT Time Calculation (min) 30 min    Activity Tolerance Patient tolerated treatment well             Past Medical History:  Diagnosis Date   Arthritis    hips, lower back   Breast lump 10/21/2011   Overview:  Negative diagnostic mammogram 09/2011    Depression    Fracture of rib of right side 08/17/2013   Hypercholesterolemia    Hypothyroidism    Low blood pressure 10/25/2014   Panic disorder    previous agoraphobia   Seizures (HCC)    febrile - as infant   Shoulder pain, left    bursa   Vertigo    positional - rare   Vitamin D deficiency    Past Surgical History:  Procedure Laterality Date   ABDOMINAL HYSTERECTOMY  1981   partial - secondary to bleeding, ovaries not removed -  vaginal   CATARACT EXTRACTION W/ INTRAOCULAR LENS  IMPLANT, BILATERAL     COLONOSCOPY WITH PROPOFOL N/A 05/05/2015   Procedure: COLONOSCOPY WITH PROPOFOL;  Surgeon: Lucilla Lame, MD;  Location: [place];  Service: Endoscopy;  Laterality: N/A;   COLONOSCOPY WITH PROPOFOL N/A 06/26/2021   Procedure: COLONOSCOPY WITH PROPOFOL;  Surgeon: Jonathon Bellows, MD;  Location: [facility] ENDOSCOPY;  Service: Gastroenterology;  Laterality: N/A;   MASS EXCISION Right 12/24/2017   Procedure: EXCISION MASS ( SHIN );  Surgeon: Katha Cabal, MD;  Location: [facility];  Service: Vascular;  Laterality: Right;   ORIF FIBULA FRACTURE Right 2003   right lower extremitiy   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   shoulder Left    bone suprs   SHOULDER SURGERY  12/2015   TONSILLECTOMY  1974   Patient Active Problem List   Diagnosis Date Noted   Cough  01/28/2022   Allergic rhinitis 12/24/2021   Costochondritis 10/03/2021   Grief 03/21/2021   BPPV (benign paroxysmal positional vertigo), right 12/08/2020   Primary osteoarthritis of both hips 08/10/2020   Balance problem 06/05/2020   Left hip pain 05/04/2020   Trochanteric bursitis of left hip 05/04/2020   Vaginal discharge 08/26/2019   Urine incontinence 01/15/2019   Arthritis 01/15/2019   Prediabetes 01/15/2019   Postherpetic neuralgia 04/22/2018   Menopausal vaginal dryness 05/09/2017   Dyspareunia in female 05/09/2017   Angiodysplasia of intestine with hemorrhage    Idiopathic colitis    Cyst of soft tissue 01/14/2014   History of colonic polyps 03/11/2013   Anxiety and depression 03/07/2013   Panic disorder 03/07/2013   Hypercholesterolemia 03/07/2013   Hypothyroidism 03/07/2013   Skin cancer 03/07/2013   Nicotine dependence 06/10/2012    PCP: Leone Haven, MD   REFERRING PROVIDER: Leone Haven, MD   REFERRING DIAG:  N32.81 (ICD-10-CM) - OAB (overactive bladder)   THERAPY DIAG:  No diagnosis found.  Rationale for Evaluation and Treatment: Rehabilitation  ONSET DATE:  PRECAUTIONS: None  WEIGHT BEARING RESTRICTIONS: No  FALLS:  Has patient fallen in last 6 months? 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 Pt notices urinary leakage after waking up in the morning if she does not wake up in the night to get up. Pt is currently wearing pantyliners and feels a sense of urgency. Pt does not feel she is leaking every day.    LIVING ENVIRONMENT: Lives with: lives with their spouse Lives in: House/apartment    PATIENT GOALS:  Pt would like not to have leakage     UROLOGICAL HISTORY Fluid intake: Water mostly  Pain with urination: Yes after intercourse Fully empty bladder: No, before going to bed Toileting posture: feet flat  Stream: Strong   Urgency: Yes: walking to bathroom Frequency: every 3 hrs, hx of holding back urine   Nocturia: 0-2x on average  Leakage: Walking to the bathroom Pads: Yes Type: Pantyliners  Amount: 1x/day   Bladder control (0-10): 4/10  GASTROINTESTINAL HISTORY Pt has no concerns Pain with bowel movement: No   SEXUAL HISTORY/FUNCTION Pain with intercourse: Initial Penetration and During Penetration Ability to have vaginal penetration: Yes but with pain; Deep thrusting: Yes but with pain Able to achieve orgasm?: No   OBSTETRICAL HISTORY Vaginal deliveries: G3P2 Tearing: episiotomy with both    GYNECOLOGICAL HISTORY Hysterectomy: yes, abdominal hysterectomy  Pelvic Organ Prolapse: no Pain with exam: yes no Heaviness/pressure: yes no  SUBJECTIVE:  Pt arrived 10 mins after scheduled appt. Pt has not had any leakage this week and also has not had increased urinary urgency as well.    PAIN:  Are you having pain? No NPRS scale: 0/10    OBJECTIVE:    COGNITION: Overall cognitive status: Within functional limits for tasks assessed     POSTURE:  Thoracic kyphosis: slight in standing Iliac crest height: L iliac crest higher  Pelvic obliquity: WNL   RANGE OF MOTION:   (Norm range in degrees)  LEFT 02/05/22 RIGHT 02/05/22  Lumbar forward flexion (65):  WNL    Lumbar extension (30): WNL    Lumbar lateral flexion (25):  Restricted (to knee) WNL (past knee)  Thoracic and Lumbar rotation (30 degrees):    Restricted  WNL  Hip Flexion (0-125):   WNL WNL  Hip IR (0-45):  WNL WNL  Hip ER (0-45):  WNL WNL  Hip Adduction:      Hip Abduction (0-40):  WNL WNL  Hip extension (0-15):     (*= pain, Blank rows = not tested)   STRENGTH: MMT    RLE 02/05/22 LLE 02/05/22   Hip Flexion 5 4   Hip Extension 4 4  Hip Abduction     Hip Adduction     Hip ER  5 4*  Hip IR  5 4  Knee Extension 5 4*  Knee Flexion 5 5  Dorsiflexion     Plantarflexion (seated) 5 5  (*= fasciculations, Blank rows = not tested)   SPECIAL TESTS:   FABER (SN 81): negative B FADIR (SN 94): positive on R   PALPATION:  Abdominal:  Diastasis: none Rib flare: present B, significant  EXTERNAL PELVIC EXAM: Patient educated on the purpose of the pelvic exam and articulated understanding; patient consented to the exam verbally.  Breath coordination: inconsistent Voluntary Contraction: present, 2/5 MMT, cueing required to decrease adductor compensation, Valsalva noted Relaxation: delayed Perineal movement with  sustained IAP increase ("bear down"): no change Perineal movement with rapid IAP increase ("cough"): no change (0= no contraction, 1= flicker, 2= weak squeeze, 3= fair squeeze with lift, 4= good squeeze and lift against resistance, 5= strong squeeze against strong resistance)   TODAY'S TREATMENT  Neuromuscular Re-education:  POSTURE:  Iliac crest height: iliac crests equal (compared to IE: L iliac crest is higher) Pelvic obliquity: WNL  Lengthy discussion on pain during penetrative sex and the female sexual response/arousal. Also discussed various forms of intimacy that may not involve penetrative sex. Pt verbalized understanding.   Discussion on and demonstration on model of posterior fourchette mobilization to aid in guiding DPT for internal PFM exam next session (also preparing Pt). Pt guided on how to use diaphragmatic breathing when pressure or movement is applied at the posterior fourchette.   Discussion on how to use PFM lengthening techniques/diaphragmatic breathing 2-5 mins before intercourse to aid in downregulation of the nervous system as body anticipates extreme pain. Pt verbalized understanding.   Brief discussion on beginning dilator training soon to aid in  returning to penetrative sex without pain or limitation. Pt given [employer] and if buying elsewhere to invest in silicone-based dilators.    Patient response to interventions: Pt pleased with her L iliac crest being equal to the R    Patient Education:  Patient provided with HEP including: posterior fourchette mobilization and [employer]. Patient educated throughout session on appropriate technique and form using multi-modal cueing, HEP, and activity modification. Patient will benefit from further education in order to maximize compliance and understanding for long-term therapeutic gains.   ASSESSMENT:  Clinical Impression: Patient presents to clinic with excellent motivation to participate in today's session. Pt continues to demonstrate deficits in IAP management, PFM coordination, PFM extensibility, posture, LE strength, ROM, pain and scar mobility. Pt reports no urinary leakage this week and was able to control her urinary urgency signals throughout the day. Significant time taken to discuss pain with penetrative sex. Discussion on the sympathetic nervous system and how PFM anticipate pain. Discussion and demonstration on beginning posterior fourchette mobilizations as that will aid the DPT next session in performing an internal PFM exam as well as begin lengthening tissues at the pelvic floor. Upon posture reassessment, Pt demonstrates improved posture with B iliac crests being equal. Pt responded positively to active and educational interventions. Patient will benefit from skilled therapeutic intervention to address deficits in IAP management, PFM coordination, PFM extensibility, posture, LE strength, ROM, and scar mobility in order to increase PLOF and improve overall QOL.    Objective Impairments: decreased coordination, decreased endurance, decreased strength, increased fascial restrictions, increased muscle spasms, improper body mechanics, and postural dysfunction.    Activity Limitations: standing, continence, toileting, and locomotion level  Personal Factors: Age, Behavior pattern, Past/current experiences, Time since onset of injury/illness/exacerbation, and 1-2 comorbidities: insomnia and anxiety/depression  are also affecting patient's functional outcome.   Rehab Potential: Good  Clinical Decision Making: Evolving/moderate complexity  Evaluation Complexity: Moderate   GOALS: Goals reviewed with patient? Yes  SHORT TERM GOALS: Target date: 03/28/22  Patient will demonstrate independence with HEP in order to maximize therapeutic gains and improve carryover from physical therapy sessions to ADLs in the home and community. Baseline: bladder diary app and vaginal moisturizer handout  Goal status: INITIAL    LONG TERM GOALS: Target date: 04/25/22   Patient will score  >/= 61 on FOTO Urinary Problem  in order to demonstrate decreased pain, improved PFM coordination,  improved breathing mechanics and overall QOL.  Baseline: 54 Goal status: INITIAL  2.  Patient will report confidence in ability to control bladder > 7/10 in order to demonstrate improved function and ability to participate more fully in activities at home and in the community. Baseline: 4/10  Goal status: INITIAL  3.  Patient will report decreased reliance on protective undergarments as indicated by a 24 hour period to demonstrate improved bladder control and allow for increased participation in activities outside of the home. Baseline: pantyliner 1x usually but on trips up to 2x/day Goal status: INITIAL  4.  Patient will demonstrate coordinated lengthening and relaxation of PFM with diaphragmatic inhalation in order to decrease spasm and allow for unrestricted elimination of urine/feces for improved overall QOL. Baseline: inconsistent breath coordination, delayed relaxation Goal status: INITIAL  5.  Patient will demonstrate circumferential and sequential contraction of >3/5  MMT, > 5 sec hold x5 and 5 consecutive quick flicks with </= 10 min rest between testing bouts, and relaxation of the PFM coordinated with breath for improved management of intra-abdominal pressure and normal bowel and bladder function without the presence of pain nor incontinence in order to improve participation at home and in the community. Baseline: 2/5 MMT with noted spasm of PFM/no quick flicks tested Goal status: INITIAL   PLAN: PT Frequency: 1x/week  PT Duration: 12 weeks  Planned Interventions: Therapeutic exercises, Therapeutic activity, Neuromuscular re-education, Balance training, Gait training, Patient/Family education, Self Care, Joint mobilization, Spinal mobilization, Cryotherapy, Moist heat, scar mobilization, Taping, and Manual therapy  Plan For Next Session: PFM internal exam, or SB techniques/manual at abdomen    [facility], PT, DPT  03/07/2022, 1:27 PM

## 2022-03-21 ENCOUNTER — Encounter: Payer: Self-pay | Admitting: Family Medicine

## 2022-03-21 ENCOUNTER — Ambulatory Visit: Payer: Medicare Other

## 2022-03-22 ENCOUNTER — Telehealth (INDEPENDENT_AMBULATORY_CARE_PROVIDER_SITE_OTHER): Payer: Medicare Other | Admitting: Internal Medicine

## 2022-03-22 ENCOUNTER — Encounter: Payer: Self-pay | Admitting: Internal Medicine

## 2022-03-22 DIAGNOSIS — R7303 Prediabetes: Secondary | ICD-10-CM | POA: Diagnosis not present

## 2022-03-22 DIAGNOSIS — U071 COVID-19: Secondary | ICD-10-CM | POA: Diagnosis not present

## 2022-03-22 DIAGNOSIS — F32A Depression, unspecified: Secondary | ICD-10-CM

## 2022-03-22 DIAGNOSIS — F419 Anxiety disorder, unspecified: Secondary | ICD-10-CM

## 2022-03-22 MED ORDER — ONDANSETRON 4 MG PO TBDP
4.0000 mg | ORAL_TABLET | Freq: Three times a day (TID) | ORAL | 0 refills | Status: DC | PRN
Start: 2022-03-22 — End: 2022-08-28

## 2022-03-22 MED ORDER — NIRMATRELVIR/RITONAVIR (PAXLOVID)TABLET: 3.0000 | ORAL_TABLET | Freq: Two times a day (BID) | ORAL | 0 refills | Status: AC

## 2022-03-22 MED ORDER — HYDROCODONE BIT-HOMATROP MBR 5-1.5 MG/5ML PO SOLN: 5.0000 mL | Freq: Four times a day (QID) | ORAL | 0 refills | Status: AC | PRN

## 2022-03-22 NOTE — Progress Notes (Signed)
Patient ID: Pam Salazar, female   DOB: 08-15-1945, 76 y.o.   MRN: [id number]  Virtual Visit via Video Note  I connected with Willow Ora on 03/22/22 at  4:00 PM EDT by a video enabled telemedicine application and verified that I am speaking with the correct person using two identifiers.  Location of all participants today Patient: at home Provider: at office   I discussed the limitations of evaluation and management by telemedicine and the availability of in person appointments. The patient expressed understanding and agreed to proceed.  History of Present Illness: Here to f/u with URI symtoms for 3 days, 1 day later than husband with similar symptoms and tested + for COVID 3 days ago.  Pt tested + for covid today.  Has decreased appetite and sinus congestion, loss of taste and smell, and scant prod cough.  Denies worsening vormiting diarrhea but has some nausea.  Pt denies chest pain, increased sob or doe, wheezing, orthopnea, PND, increased LE swelling, palpitations, dizziness or syncope.  Denies worsening depressive symptoms, suicidal ideation, or panic.  Pt denies polydipsia, polyuria, or new focal neuro s/s.   Past Medical History:  Diagnosis Date   Arthritis    hips, lower back   Breast lump 10/21/2011   Overview:  Negative diagnostic mammogram 09/2011    Depression    Fracture of rib of right side 08/17/2013   Hypercholesterolemia    Hypothyroidism    Low blood pressure 10/25/2014   Panic disorder    previous agoraphobia   Seizures (HCC)    febrile - as infant   Shoulder pain, left    bursa   Vertigo    positional - rare   Vitamin D deficiency    Past Surgical History:  Procedure Laterality Date   ABDOMINAL HYSTERECTOMY  1981   partial - secondary to bleeding, ovaries not removed -  vaginal   CATARACT EXTRACTION W/ INTRAOCULAR LENS  IMPLANT, BILATERAL     COLONOSCOPY WITH PROPOFOL N/A 05/05/2015   Procedure: COLONOSCOPY WITH PROPOFOL;  Surgeon: Lucilla Lame,  MD;  Location: [place];  Service: Endoscopy;  Laterality: N/A;   COLONOSCOPY WITH PROPOFOL N/A 06/26/2021   Procedure: COLONOSCOPY WITH PROPOFOL;  Surgeon: Jonathon Bellows, MD;  Location: [facility] ENDOSCOPY;  Service: Gastroenterology;  Laterality: N/A;   MASS EXCISION Right 12/24/2017   Procedure: EXCISION MASS ( SHIN );  Surgeon: Katha Cabal, MD;  Location: [facility]S;  Service: Vascular;  Laterality: Right;   ORIF FIBULA FRACTURE Right 2003   right lower extremitiy   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   shoulder Left    bone suprs   SHOULDER SURGERY  12/2015   TONSILLECTOMY  1974    reports that she has quit smoking. Her smoking use included cigarettes. She quit smokeless tobacco use about 8 years ago. She reports that she does not drink alcohol and does not use drugs. family history includes Arthritis in her father and mother; Breast cancer (age of onset: 56) in her sister; Breast cancer (age of onset: 54) in her maternal aunt; Colon cancer in her maternal uncle; Diabetes in her brother; Heart disease in her brother; Hyperlipidemia in her mother; Prostate cancer in her father. Allergies  Allergen Reactions   Duloxetine Hcl Other (See Comments)    depression worsened   Ezetimibe-Simvastatin Other (See Comments)    Joint pain   Current Outpatient Medications on File Prior to Visit  Medication Sig Dispense Refill   clonazePAM (KLONOPIN) 0.5 MG tablet  Take 1 tablet (0.5 mg total) by mouth 2 (two) times daily as needed for anxiety. 60 tablet 2   diphenhydramine-acetaminophen (TYLENOL PM) 25-500 MG TABS tablet Take 1 tablet by mouth at bedtime.     hydrOXYzine (ATARAX) 25 MG tablet Take 1 tablet (25 mg total) by mouth 3 (three) times daily as needed for anxiety. 90 tablet 1   levothyroxine (SYNTHROID) 125 MCG tablet TAKE ONE TABLET BY MOUTH DAILY BEFORE BREAKFAST 90 tablet 1   Multiple Vitamins-Minerals (PRESERVISION AREDS 2 PO) Take 1 capsule by mouth 2 (two) times daily.       sertraline (ZOLOFT) 100 MG tablet TAKE TWO TABLETS BY MOUTH DAILY 180 tablet 1   simvastatin (ZOCOR) 20 MG tablet Take 1 tablet (20 mg total) by mouth every evening. 90 tablet 1   No current facility-administered medications on file prior to visit.    Observations/Objective: Alert, NAD, appropriate mood and affect, resps normal, cn 2-12 intact, moves all 4s, no visible rash or swelling Lab Results  Component Value Date   WBC 5.1 06/05/2020   HGB 12.6 06/05/2020   HCT 38.2 06/05/2020   PLT 182.0 06/05/2020   GLUCOSE 107 (H) 12/18/2021   CHOL 153 06/04/2021   TRIG 189.0 (H) 06/04/2021   HDL 53.60 06/04/2021   LDLCALC 62 06/04/2021   ALT 10 06/04/2021   AST 20 06/04/2021   NA 142 12/18/2021   K 3.8 12/18/2021   CL 105 12/18/2021   CREATININE 0.89 12/18/2021   BUN 28 (H) 12/18/2021   CO2 27 12/18/2021   TSH 4.05 12/18/2021   INR 0.96 12/22/2017   HGBA1C 5.2 01/15/2019   Assessment and Plan: See notes  Follow Up Instructions: See notes   I discussed the assessment and treatment plan with the patient. The patient was provided an opportunity to ask questions and all were answered. The patient agreed with the plan and demonstrated an understanding of the instructions.   The patient was advised to call back or seek an in-person evaluation if the symptoms worsen or if the condition fails to improve as anticipated.   Cathlean Cower, MD

## 2022-03-22 NOTE — Assessment & Plan Note (Signed)
Overall stable, declines need for change in tx or referral

## 2022-03-22 NOTE — Patient Instructions (Signed)
Please take all new medication as prescribed 

## 2022-03-22 NOTE — Assessment & Plan Note (Signed)
Mild to mod, for paxlovid course, cough med prn, and zofran prn nausea,  to f/u any worsening symptoms or concerns

## 2022-03-22 NOTE — Telephone Encounter (Signed)
Patient is on the phone now with Maudie Mercury and she will schedule a VV with a provider. Marenda Accardi,cma

## 2022-03-22 NOTE — Assessment & Plan Note (Signed)
Lab Results  Component Value Date   HGBA1C 5.2 01/15/2019   Stable, pt to continue current medical treatment  - diet, wt control, exercise

## 2022-03-25 ENCOUNTER — Other Ambulatory Visit: Payer: Self-pay | Admitting: Family Medicine

## 2022-03-25 ENCOUNTER — Ambulatory Visit: Payer: Self-pay | Admitting: Urology

## 2022-03-25 DIAGNOSIS — F419 Anxiety disorder, unspecified: Secondary | ICD-10-CM

## 2022-03-26 ENCOUNTER — Ambulatory Visit: Payer: Medicare Other

## 2022-03-27 ENCOUNTER — Ambulatory Visit: Payer: Medicare Other | Admitting: Family Medicine

## 2022-03-28 ENCOUNTER — Ambulatory Visit: Payer: Medicare Other

## 2022-04-04 ENCOUNTER — Telehealth: Payer: Self-pay | Admitting: Family Medicine

## 2022-04-04 ENCOUNTER — Ambulatory Visit: Payer: Medicare Other

## 2022-04-04 NOTE — Telephone Encounter (Signed)
The patient took a home test and she tested positive for Covid. She did not want another virtual visit . She tested positive on 03/22/22 and saw Dr. Jenny Reichmann. She was given cough syrup and Paxlovid. She has a cough and she wanted the physician updated.

## 2022-04-05 ENCOUNTER — Encounter: Payer: Self-pay | Admitting: Family Medicine

## 2022-04-05 NOTE — Telephone Encounter (Signed)
Noted  

## 2022-04-11 ENCOUNTER — Ambulatory Visit: Payer: Medicare Other

## 2022-04-12 ENCOUNTER — Ambulatory Visit: Payer: Medicare Other | Admitting: Family Medicine

## 2022-04-18 ENCOUNTER — Ambulatory Visit: Payer: Medicare Other

## 2022-04-18 NOTE — Therapy (Signed)
[place] [facility] [address] [place], [place], [address] Phone: [phone]   Fax:  [phone]  April 18, 2022   No Recipients  Physical Therapy Discharge Summary  Patient: Pam Salazar  MRN: [id number]  Date of Birth: [birth year]   Diagnosis:  THERAPY DIAG:  Pelvic floor dysfunction  Other lack of coordination  Muscle weakness (generalized)  The above patient had been seen in Physical Therapy 5 times of 10 treatments scheduled with 0 no shows and 1 cancellations.  The treatment consisted of improving IAP management, PFM coordination, PFM extensibility, pain, and posture.  The patient is: Improved  Subjective:  Eval: Pt is having urinary leakage ("dribbling") sometimes. Pt notices urinary leakage after waking up in the morning if she does not wake up in the night to get up. Pt is currently wearing pantyliners and feels a sense of urgency. Pt does not feel she is leaking every day.   Last session 03/07/22: Pt has not had any leakage this week and also has not had increased urinary urgency as well.    Discharge Findings: Self-discharged due to some medical complications, reports will return when appropriate   Functional Status at Discharge: Did not assess  No Goals Met  SHORT TERM GOALS: Target date: 03/28/22  Patient will demonstrate independence with HEP in order to maximize therapeutic gains and improve carryover from physical therapy sessions to ADLs in the home and community. Baseline: bladder diary app and vaginal moisturizer handout  Goal status: INITIAL    LONG TERM GOALS: Target date: 04/25/22   Patient will score  >/= 61 on FOTO Urinary Problem  in order to demonstrate decreased pain, improved PFM coordination, improved breathing mechanics and overall QOL.  Baseline: 54 Goal status: INITIAL  2.  Patient will report confidence in ability to control bladder > 7/10 in order to demonstrate improved  function and ability to participate more fully in activities at home and in the community. Baseline: 4/10  Goal status: INITIAL  3.  Patient will report decreased reliance on protective undergarments as indicated by a 24 hour period to demonstrate improved bladder control and allow for increased participation in activities outside of the home. Baseline: pantyliner 1x usually but on trips up to 2x/day Goal status: INITIAL  4.  Patient will demonstrate coordinated lengthening and relaxation of PFM with diaphragmatic inhalation in order to decrease spasm and allow for unrestricted elimination of urine/feces for improved overall QOL. Baseline: inconsistent breath coordination, delayed relaxation Goal status: INITIAL  5.  Patient will demonstrate circumferential and sequential contraction of >3/5 MMT, > 5 sec hold x5 and 5 consecutive quick flicks with </= 10 min rest between testing bouts, and relaxation of the PFM coordinated with breath for improved management of intra-abdominal pressure and normal bowel and bladder function without the presence of pain nor incontinence in order to improve participation at home and in the community. Baseline: 2/5 MMT with noted spasm of PFM/no quick flicks tested Goal status: INITIAL  Sincerely,   Filbert Berthold, PT, DPT    CC No Recipients  [place] [address] [place], [place], [address] Phone: [phone]   Fax:  [phone]  Patient: Pam Salazar  MRN: [id number]  Date of Birth: Mar 16, 1946

## 2022-04-22 ENCOUNTER — Other Ambulatory Visit: Payer: Self-pay | Admitting: Family Medicine

## 2022-04-22 DIAGNOSIS — F32A Depression, unspecified: Secondary | ICD-10-CM

## 2022-04-23 ENCOUNTER — Encounter: Payer: Self-pay | Admitting: Family Medicine

## 2022-04-25 ENCOUNTER — Ambulatory Visit: Payer: Medicare Other

## 2022-04-26 NOTE — Telephone Encounter (Signed)
Copied from [place] [id number]. Topic: Medicare AWV >> Apr 26, 2022 11:17 AM Devoria Glassing wrote: Reason for CRM: Left message for patient to schedule Annual Wellness Visit.  Please schedule with Nurse Health Advisor Denisa O'Brien-Blaney, LPN at [facility]. This appt can be telephone or office visit.  Please call [phone] ask for [facility]

## 2022-05-01 DIAGNOSIS — M1611 Unilateral primary osteoarthritis, right hip: Secondary | ICD-10-CM | POA: Diagnosis not present

## 2022-05-02 ENCOUNTER — Ambulatory Visit: Payer: Medicare Other

## 2022-05-09 ENCOUNTER — Ambulatory Visit: Payer: Medicare Other

## 2022-05-10 ENCOUNTER — Ambulatory Visit (INDEPENDENT_AMBULATORY_CARE_PROVIDER_SITE_OTHER): Payer: Medicare Other

## 2022-05-10 VITALS — Ht 62.0 in | Wt 126.0 lb

## 2022-05-10 DIAGNOSIS — Z0000 Encounter for general adult medical examination without abnormal findings: Secondary | ICD-10-CM

## 2022-05-10 NOTE — Progress Notes (Signed)
Subjective:   Pam Salazar is a 76 y.o. female who presents for Medicare Annual (Subsequent) preventive examination.  Review of Systems    No ROS.  Medicare Wellness Virtual Visit.  Visual/audio telehealth visit, UTA vital signs.   See social history for additional risk factors.   Cardiac Risk Factors include: advanced age (>80mn, >>64women)     Objective:    Today's Vitals   05/10/22 1306  Weight: 126 lb (57.2 kg)  Height: '5\' 2"'$  (1.575 m)   Body mass index is 23.05 kg/m.     05/10/2022    1:05 PM 01/31/2022    1:18 PM 06/26/2021   10:03 AM 03/16/2021    4:04 PM 02/01/2020   12:50 PM 01/15/2019   10:05 AM 01/13/2018   11:18 AM  Advanced Directives  Does Patient Have a Medical Advance Directive? Yes Yes Yes Yes Yes Yes Yes  Type of AIndustrial/product designerof AFreescale SemiconductorPower of ASoda SpringsLiving will HMound StationLiving will HDaveyLiving will HQuaker CityLiving will HDover Does patient want to make changes to medical advance directive? No - Patient declined No - Patient declined  No - Patient declined No - Patient declined No - Patient declined No - Patient declined  Copy of HWilliamsin Chart? No - copy requested   No - copy requested No - copy requested No - copy requested No - copy requested    Current Medications (verified) Outpatient Encounter Medications as of 05/10/2022  Medication Sig   clonazePAM (KLONOPIN) 0.5 MG tablet TAKE ONE TABLET BY MOUTH TWICE A DAY AS NEEDED FOR ANXIETY   diphenhydramine-acetaminophen (TYLENOL PM) 25-500 MG TABS tablet Take 1 tablet by mouth at bedtime.   hydrOXYzine (ATARAX) 25 MG tablet TAKE ONE TABLET BY MOUTH THREE TIMES A DAY AS NEEDED FOR ANXIETY   levothyroxine (SYNTHROID) 125 MCG tablet TAKE ONE TABLET BY MOUTH DAILY BEFORE BREAKFAST   Multiple Vitamins-Minerals (PRESERVISION AREDS 2  PO) Take 1 capsule by mouth 2 (two) times daily.    ondansetron (ZOFRAN-ODT) 4 MG disintegrating tablet Take 1 tablet (4 mg total) by mouth every 8 (eight) hours as needed for nausea or vomiting.   sertraline (ZOLOFT) 100 MG tablet TAKE TWO TABLETS BY MOUTH DAILY   simvastatin (ZOCOR) 20 MG tablet Take 1 tablet (20 mg total) by mouth every evening.   No facility-administered encounter medications on file as of 05/10/2022.    Allergies (verified) Duloxetine hcl and Ezetimibe-simvastatin   History: Past Medical History:  Diagnosis Date   Arthritis    hips, lower back   Breast lump 10/21/2011   Overview:  Negative diagnostic mammogram 09/2011    Depression    Fracture of rib of right side 08/17/2013   Hypercholesterolemia    Hypothyroidism    Low blood pressure 10/25/2014   Panic disorder    previous agoraphobia   Seizures (HCC)    febrile - as infant   Shoulder pain, left    bursa   Vertigo    positional - rare   Vitamin D deficiency    Past Surgical History:  Procedure Laterality Date   ABDOMINAL HYSTERECTOMY  1981   partial - secondary to bleeding, ovaries not removed -  vaginal   CATARACT EXTRACTION W/ INTRAOCULAR LENS  IMPLANT, BILATERAL     COLONOSCOPY WITH PROPOFOL N/A 05/05/2015   Procedure: COLONOSCOPY WITH PROPOFOL;  Surgeon: DLucilla Lame MD;  Location: [place];  Service: Endoscopy;  Laterality: N/A;   COLONOSCOPY WITH PROPOFOL N/A 06/26/2021   Procedure: COLONOSCOPY WITH PROPOFOL;  Surgeon: Jonathon Bellows, MD;  Location: [facility];  Service: Gastroenterology;  Laterality: N/A;   MASS EXCISION Right 12/24/2017   Procedure: EXCISION MASS ( SHIN );  Surgeon: Katha Cabal, MD;  Location: [facility];  Service: Vascular;  Laterality: Right;   ORIF FIBULA FRACTURE Right 2003   right lower extremitiy   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   shoulder Left    bone suprs   SHOULDER SURGERY  12/2015   TONSILLECTOMY  1974   Family History  Problem  Relation Age of Onset   Arthritis Mother    Hyperlipidemia Mother    Arthritis Father    Prostate cancer Father    Breast cancer Sister 75   Heart disease Brother        heart attack   Diabetes Brother    Breast cancer Maternal Aunt 85   Colon cancer Maternal Uncle    Social History   Socioeconomic History   Marital status: Married    Spouse name: Not on file   Number of children: Not on file   Years of education: Not on file   Highest education level: Not on file  Occupational History   Not on file  Tobacco Use   Smoking status: Former    Types: Cigarettes   Smokeless tobacco: Former    Quit date: 07/26/2013   Tobacco comments:       Vaping Use   Vaping Use: Never used  Substance and Sexual Activity   Alcohol use: No    Alcohol/week: 0.0 standard drinks of alcohol   Drug use: No   Sexual activity: Yes    Birth control/protection: Post-menopausal  Other Topics Concern   Not on file  Social History Narrative   Former Marine scientist   Social Determinants of Health   Financial Resource Strain: [place]  (05/10/2022)   Overall Financial Resource Strain (CARDIA)    Difficulty of Paying Living Expenses: Not hard at all  Food Insecurity: No Food Insecurity (03/16/2021)   Hunger Vital Sign    Worried About Running Out of Food in the Last Year: Never true    [place] in the Last Year: Never true  Transportation Needs: No Transportation Needs (05/10/2022)   PRAPARE - Hydrologist (Medical): No    Lack of Transportation (Non-Medical): No  Physical Activity: Sufficiently Active (05/10/2022)   Exercise Vital Sign    Days of Exercise per Week: 5 days    Minutes of Exercise per Session: 30 min  Stress: No Stress Concern Present (05/10/2022)   [place]    Feeling of Stress : Only a little  Social Connections: Socially Integrated (05/10/2022)   Social Connection and Isolation Panel  [NHANES]    Frequency of Communication with Friends and Family: More than three times a week    Frequency of Social Gatherings with Friends and Family: More than three times a week    Attends Religious Services: More than 4 times per year    Active Member of [employer] or Organizations: Yes    Attends Music therapist: More than 4 times per year    Marital Status: Married    Tobacco Counseling Counseling given: Not Answered Tobacco comments:     Clinical Intake:  Pre-visit preparation completed: Yes  Diabetes: No  How often do you need to have someone help you when you read instructions, pamphlets, or other written materials from your doctor or pharmacy?: 1 - Never    Interpreter Needed?: No      Activities of Daily Living    05/10/2022    1:14 PM  In your present state of health, do you have any difficulty performing the following activities:  Hearing? 1  Comment Wears hearing aids  Vision? 0  Difficulty concentrating or making decisions? 0  Walking or climbing stairs? 0  Dressing or bathing? 0  Doing errands, shopping? 0  Preparing Food and eating ? N  Using the Toilet? N  In the past six months, have you accidently leaked urine? Y  Do you have problems with loss of bowel control? N  Managing your Medications? N  Managing your Finances? N  Housekeeping or managing your Housekeeping? N    Patient Care Team: Leone Haven, MD as PCP - General (Family Medicine) Christene Lye, MD as Consulting Physician (General Surgery) Einar Pheasant, MD (Internal Medicine)  Indicate any recent Medical Services you may have received from other than [facility] providers in the past year (date may be approximate).     Assessment:   This is a routine wellness examination for Orene.  I connected with  Willow Ora on 05/10/22 by a audio enabled telemedicine application and verified that I am speaking with the correct person using two  identifiers.  Patient Location: Home  Provider Location: Office/Clinic  I discussed the limitations of evaluation and management by telemedicine. The patient expressed understanding and agreed to proceed.   Hearing/Vision screen Hearing Screening - Comments:: Patient has difficulty hearing television programs. Level of difficulty varies when having conversation with others. Patient only wears hearing aids when watching TV.  Vision Screening - Comments:: Followed by [facility] Wears corrective lenses Annual visits Age related macular degeneration; taking areds 2 daily Cataract extraction, bilateral They have regular follow up with the ophthalmologis   Dietary issues and exercise activities discussed: Current Exercise Habits: Home exercise routine, Type of exercise: walking, Time (Minutes): 10, Intensity: Mild   Goals Addressed               This Visit's Progress     Patient Stated     Increase physical activity (pt-stated)   On track     Increase walking more for exercise        Depression Screen    05/10/2022    1:17 PM 12/24/2021   12:13 PM 10/03/2021   11:46 AM 03/16/2021    3:55 PM 08/10/2020    3:04 PM 06/05/2020   11:11 AM 02/01/2020   12:53 PM  PHQ 2/9 Scores  PHQ - 2 Score 0 0 0 0 0 0 0  PHQ- 9 Score     4      Fall Risk    05/10/2022    1:13 PM 12/24/2021   12:12 PM 10/03/2021   11:45 AM 03/16/2021    4:17 PM 09/18/2020   11:39 AM  [place] in the past year? 0 0 0 1 0  Number falls in past yr: 0 0 0 0 0  Injury with Fall?  0 0 0   Comment    Pulled when walking her dog and fell on knee. Reports no medical follow up needed.   Risk for fall due to :  No Fall Risks No Fall Risks  Follow up Falls evaluation completed;Falls prevention discussed Falls evaluation completed Falls evaluation completed Falls evaluation completed Falls evaluation completed    FALL RISK PREVENTION PERTAINING TO THE HOME: Home free of loose throw rugs in  walkways, pet beds, electrical cords, etc? Yes  Adequate lighting in your home to reduce risk of falls? Yes   ASSISTIVE DEVICES UTILIZED TO PREVENT FALLS: Life/watch fall alert? Yes  Use of a cane, walker or w/c? No  Grab bars in the bathroom? No  Shower chair or bench in shower? No  Elevated toilet seat or a handicapped toilet? No   TIMED UP AND GO: Was the test performed? No .   Cognitive Function:    01/13/2018   12:34 PM 07/11/2015    1:44 PM  MMSE - Mini Mental State Exam  Orientation to time 5 5  Orientation to Place 5 5  Registration 3 3  Attention/ Calculation 5 5  Recall 3 3  Language- name 2 objects 2 2  Language- repeat 1 1  Language- follow 3 step command 3 3  Language- read & follow direction 1 1  Write a sentence 1 1  Copy design 1 1  Total score 30 30        05/10/2022    1:15 PM 01/15/2019   10:03 AM  6CIT Screen  What Year? 0 points 0 points  What month? 0 points 0 points  What time? 0 points 0 points  Count back from 20 0 points 0 points  Months in reverse 0 points 0 points  Repeat phrase 0 points   Total Score 0 points     Immunizations Immunization History  Administered Date(s) Administered   Influenza Split 06/28/2013, 05/27/2014, 04/21/2017   Influenza, High Dose Seasonal PF 05/19/2018, 02/25/2019, 03/12/2021   Influenza-Unspecified 05/25/2015, 05/24/2017, 03/13/2020   PFIZER(Purple Top)SARS-COV-2 Vaccination 08/06/2019, 08/31/2019, 03/13/2020, 11/30/2020, 03/12/2021   Pneumococcal Conjugate-13 08/03/2013   Pneumococcal Polysaccharide-23 09/14/2012, 07/11/2015   Tdap 02/25/2019   Unspecified SARS-COV-2 Vaccination 03/13/2020   Zoster, Live 06/24/2008   Flu Vaccine status: Due, Education has been provided regarding the importance of this vaccine. Advised may receive this vaccine at local pharmacy or Health Dept. Aware to provide a copy of the vaccination record if obtained from local pharmacy or Health Dept. Verbalized acceptance and  understanding.  Covid-19 vaccine status: Completed vaccinesx6.  Shingrix Completed?: No.    Education has been provided regarding the importance of this vaccine. Patient has been advised to call insurance company to determine out of pocket expense if they have not yet received this vaccine. Advised may also receive vaccine at local pharmacy or Health Dept. Verbalized acceptance and understanding.  Screening Tests Health Maintenance  Topic Date Due   COVID-19 Vaccine (7 - Pfizer risk series) 05/26/2022 (Originally 05/07/2021)   Zoster Vaccines- Shingrix (1 of 2) 08/10/2022 (Originally 06/03/1965)   INFLUENZA VACCINE  09/22/2022 (Originally 01/22/2022)   MAMMOGRAM  02/05/2023   Medicare Annual Wellness (AWV)  05/11/2023   COLONOSCOPY (Pts 45-67yr Insurance coverage will need to be confirmed)  06/27/2031   Pneumonia Vaccine 76 Years old  Completed   DEXA SCAN  Completed   Hepatitis C Screening  Completed   HPV VACCINES  Aged Out   Health Maintenance There are no preventive care reminders to display for this patient.  Lung Cancer Screening: (Low Dose CT Chest recommended if Age 76-80years, 30 pack-year currently smoking OR have quit w/in 15years.) does not qualify.   Hepatitis C Screening: Completed 2018.  Vision Screening: Recommended annual ophthalmology exams for early detection of glaucoma and other disorders of the eye.  Dental Screening: Recommended annual dental exams for proper oral hygiene.  Community Resource Referral / Chronic Care Management: CRR required this visit?  No   CCM required this visit?  No      Plan:     I have personally reviewed and noted the following in the patient's chart:   Medical and social history Use of alcohol, tobacco or illicit drugs  Current medications and supplements including opioid prescriptions. Patient is not currently taking opioid prescriptions. Functional ability and status Nutritional status Physical activity Advanced  directives List of other physicians Hospitalizations, surgeries, and ER visits in previous 12 months Vitals Screenings to include cognitive, depression, and falls Referrals and appointments  In addition, I have reviewed and discussed with patient certain preventive protocols, quality metrics, and best practice recommendations. A written personalized care plan for preventive services as well as general preventive health recommendations were provided to patient.     Leta Jungling, LPN   [id number]/[id number]

## 2022-05-10 NOTE — Patient Instructions (Addendum)
Ms. Pam Salazar , Thank you for taking time to come for your Medicare Wellness Visit. I appreciate your ongoing commitment to your health goals. Please review the following plan we discussed and let me know if I can assist you in the future.   These are the goals we discussed:  Goals       Patient Stated     Eat less sugar (pt-stated)      Increase physical activity (pt-stated)      Increase walking more for exercise         This is a list of the screening recommended for you and due dates:  Health Maintenance  Topic Date Due   COVID-19 Vaccine (7 - Pfizer risk series) 05/26/2022*   Zoster (Shingles) Vaccine (1 of 2) 08/10/2022*   Flu Shot  09/22/2022*   Mammogram  02/05/2023   Medicare Annual Wellness Visit  05/11/2023   Colon Cancer Screening  06/27/2031   Pneumonia Vaccine  Completed   DEXA scan (bone density measurement)  Completed   Hepatitis C Screening: USPSTF Recommendation to screen - Ages 35-79 yo.  Completed   HPV Vaccine  Aged Out  *Topic was postponed. The date shown is not the original due date.    Advanced directives: End of life planning; Advance aging; Advanced directives discussed.  Copy of current HCPOA/Living Will requested.    Next appointment: Follow up in one year for your annual wellness visit    Preventive Care 65 Years and Older, Female Preventive care refers to lifestyle choices and visits with your health care provider that can promote health and wellness. What does preventive care include? A yearly physical exam. This is also called an annual well check. Dental exams once or twice a year. Routine eye exams. Ask your health care provider how often you should have your eyes checked. Personal lifestyle choices, including: Daily care of your teeth and gums. Regular physical activity. Eating a healthy diet. Avoiding tobacco and drug use. Limiting alcohol use. Practicing safe sex. Taking low-dose aspirin every day. Taking vitamin and mineral  supplements as recommended by your health care provider. What happens during an annual well check? The services and screenings done by your health care provider during your annual well check will depend on your age, overall health, lifestyle risk factors, and family history of disease. Counseling  Your health care provider may ask you questions about your: Alcohol use. Tobacco use. Drug use. Emotional well-being. Home and relationship well-being. Sexual activity. Eating habits. History of falls. Memory and ability to understand (cognition). Work and work Statistician. Reproductive health. Screening  You may have the following tests or measurements: Height, weight, and BMI. Blood pressure. Lipid and cholesterol levels. These may be checked every 5 years, or more frequently if you are over 15 years old. Skin check. Lung cancer screening. You may have this screening every year starting at age 70 if you have a 30-pack-year history of smoking and currently smoke or have quit within the past 15 years. Fecal occult blood test (FOBT) of the stool. You may have this test every year starting at age 16. Flexible sigmoidoscopy or colonoscopy. You may have a sigmoidoscopy every 5 years or a colonoscopy every 10 years starting at age 56. Hepatitis C blood test. Hepatitis B blood test. Sexually transmitted disease (STD) testing. Diabetes screening. This is done by checking your blood sugar (glucose) after you have not eaten for a while (fasting). You may have this done every 1-3 years. Bone density scan.  This is done to screen for osteoporosis. You may have this done starting at age 20. Mammogram. This may be done every 1-2 years. Talk to your health care provider about how often you should have regular mammograms. Talk with your health care provider about your test results, treatment options, and if necessary, the need for more tests. Vaccines  Your health care provider may recommend certain  vaccines, such as: Influenza vaccine. This is recommended every year. Tetanus, diphtheria, and acellular pertussis (Tdap, Td) vaccine. You may need a Td booster every 10 years. Zoster vaccine. You may need this after age 27. Pneumococcal 13-valent conjugate (PCV13) vaccine. One dose is recommended after age 50. Pneumococcal polysaccharide (PPSV23) vaccine. One dose is recommended after age 54. Talk to your health care provider about which screenings and vaccines you need and how often you need them. This information is not intended to replace advice given to you by your health care provider. Make sure you discuss any questions you have with your health care provider. Document Released: 07/07/2015 Document Revised: 02/28/2016 Document Reviewed: 04/11/2015 Elsevier Interactive Patient Education  2017 [place] Prevention in the Home Falls can cause injuries. They can happen to people of all ages. There are many things you can do to make your home safe and to help prevent falls. What can I do on the outside of my home? Regularly fix the edges of walkways and driveways and fix any cracks. Remove anything that might make you trip as you walk through a door, such as a raised step or threshold. Trim any bushes or trees on the path to your home. Use bright outdoor lighting. Clear any walking paths of anything that might make someone trip, such as rocks or tools. Regularly check to see if handrails are loose or broken. Make sure that both sides of any steps have handrails. Any raised decks and porches should have guardrails on the edges. Have any leaves, snow, or ice cleared regularly. Use sand or salt on walking paths during winter. Clean up any spills in your garage right away. This includes oil or grease spills. What can I do in the bathroom? Use night lights. Install grab bars by the toilet and in the tub and shower. Do not use towel bars as grab bars. Use non-skid mats or decals in  the tub or shower. If you need to sit down in the shower, use a plastic, non-slip stool. Keep the floor dry. Clean up any water that spills on the floor as soon as it happens. Remove soap buildup in the tub or shower regularly. Attach bath mats securely with double-sided non-slip rug tape. Do not have throw rugs and other things on the floor that can make you trip. What can I do in the bedroom? Use night lights. Make sure that you have a light by your bed that is easy to reach. Do not use any sheets or blankets that are too big for your bed. They should not hang down onto the floor. Have a firm chair that has side arms. You can use this for support while you get dressed. Do not have throw rugs and other things on the floor that can make you trip. What can I do in the kitchen? Clean up any spills right away. Avoid walking on wet floors. Keep items that you use a lot in easy-to-reach places. If you need to reach something above you, use a strong step stool that has a grab bar. Keep electrical cords  out of the way. Do not use floor polish or wax that makes floors slippery. If you must use wax, use non-skid floor wax. Do not have throw rugs and other things on the floor that can make you trip. What can I do with my stairs? Do not leave any items on the stairs. Make sure that there are handrails on both sides of the stairs and use them. Fix handrails that are broken or loose. Make sure that handrails are as long as the stairways. Check any carpeting to make sure that it is firmly attached to the stairs. Fix any carpet that is loose or worn. Avoid having throw rugs at the top or bottom of the stairs. If you do have throw rugs, attach them to the floor with carpet tape. Make sure that you have a light switch at the top of the stairs and the bottom of the stairs. If you do not have them, ask someone to add them for you. What else can I do to help prevent falls? Wear shoes that: Do not have high  heels. Have rubber bottoms. Are comfortable and fit you well. Are closed at the toe. Do not wear sandals. If you use a stepladder: Make sure that it is fully opened. Do not climb a closed stepladder. Make sure that both sides of the stepladder are locked into place. Ask someone to hold it for you, if possible. Clearly mark and make sure that you can see: Any grab bars or handrails. First and last steps. Where the edge of each step is. Use tools that help you move around (mobility aids) if they are needed. These include: Canes. Walkers. Scooters. Crutches. Turn on the lights when you go into a dark area. Replace any light bulbs as soon as they burn out. Set up your furniture so you have a clear path. Avoid moving your furniture around. If any of your floors are uneven, fix them. If there are any pets around you, be aware of where they are. Review your medicines with your doctor. Some medicines can make you feel dizzy. This can increase your chance of falling. Ask your doctor what other things that you can do to help prevent falls. This information is not intended to replace advice given to you by your health care provider. Make sure you discuss any questions you have with your health care provider. Document Released: 04/06/2009 Document Revised: 11/16/2015 Document Reviewed: 07/15/2014 Elsevier Interactive Patient Education  2017 [employer].

## 2022-05-28 ENCOUNTER — Other Ambulatory Visit: Payer: Self-pay | Admitting: Family Medicine

## 2022-05-28 DIAGNOSIS — F32A Depression, unspecified: Secondary | ICD-10-CM

## 2022-05-29 ENCOUNTER — Encounter: Payer: Self-pay | Admitting: Family Medicine

## 2022-05-29 ENCOUNTER — Ambulatory Visit (INDEPENDENT_AMBULATORY_CARE_PROVIDER_SITE_OTHER): Payer: Medicare Other | Admitting: Family Medicine

## 2022-05-29 VITALS — BP 120/64 | HR 77 | Temp 97.9°F | Ht 62.0 in | Wt 122.4 lb

## 2022-05-29 DIAGNOSIS — F32A Depression, unspecified: Secondary | ICD-10-CM

## 2022-05-29 DIAGNOSIS — E039 Hypothyroidism, unspecified: Secondary | ICD-10-CM | POA: Diagnosis not present

## 2022-05-29 DIAGNOSIS — Z23 Encounter for immunization: Secondary | ICD-10-CM | POA: Diagnosis not present

## 2022-05-29 DIAGNOSIS — E7800 Pure hypercholesterolemia, unspecified: Secondary | ICD-10-CM

## 2022-05-29 DIAGNOSIS — F419 Anxiety disorder, unspecified: Secondary | ICD-10-CM | POA: Diagnosis not present

## 2022-05-29 NOTE — Assessment & Plan Note (Signed)
Check TSH with labs in 2 weeks.  Continue Synthroid 125 mcg daily.

## 2022-05-29 NOTE — Assessment & Plan Note (Signed)
Stable.  She can continue Zoloft 200 mg daily, clonazepam 0.5 mg twice daily as needed for anxiety, and hydroxyzine 25 mg 3 times daily as needed for anxiety.  Discussed that there is probably not that much I can do for her sleep and that she needs to to work on her husband's snoring.  Her husband is my patient and I encouraged her to have him schedule a visit with me to discuss this.

## 2022-05-29 NOTE — Assessment & Plan Note (Signed)
Check lipid panel. Continue simvastatin 20 mg daily. 

## 2022-05-29 NOTE — Progress Notes (Signed)
Tommi Rumps, MD Phone: [phone]  Pam Salazar is a 76 y.o. female who presents today for follow-up.  Anxiety: Patient notes generally she is doing well.  She takes clonazepam 0.5 mg twice daily.  She does not get drowsy with this.  She takes hydroxyzine once daily close to bedtime to help with anxiety.  She is also on Zoloft.  No depression or SI.  She has had some trouble sleeping recently though notes this is because her husband snores.  She gets 5 hours of sleep nightly because she has trouble falling asleep with his snoring.  Hypothyroidism: She is on Synthroid.  No skin changes.  No heat or cold intolerance.  Right hip pain: She saw orthopedics for this and she had a steroid injection which has helped significantly.  Social History   Tobacco Use  Smoking Status Former   Types: Cigarettes  Smokeless Tobacco Former   Quit date: 07/26/2013  Tobacco Comments        Current Outpatient Medications on File Prior to Visit  Medication Sig Dispense Refill   celecoxib (CELEBREX) 200 MG capsule Take 200 mg by mouth daily.     clonazePAM (KLONOPIN) 0.5 MG tablet TAKE ONE TABLET BY MOUTH TWICE A DAY AS NEEDED FOR ANXIETY 60 tablet 0   hydrOXYzine (ATARAX) 25 MG tablet TAKE ONE TABLET BY MOUTH THREE TIMES A DAY AS NEEDED FOR ANXIETY 90 tablet 1   levothyroxine (SYNTHROID) 125 MCG tablet TAKE ONE TABLET BY MOUTH DAILY BEFORE BREAKFAST 90 tablet 1   Multiple Vitamins-Minerals (PRESERVISION AREDS 2 PO) Take 1 capsule by mouth 2 (two) times daily.      ondansetron (ZOFRAN-ODT) 4 MG disintegrating tablet Take 1 tablet (4 mg total) by mouth every 8 (eight) hours as needed for nausea or vomiting. 20 tablet 0   sertraline (ZOLOFT) 100 MG tablet TAKE TWO TABLETS BY MOUTH DAILY 180 tablet 1   simvastatin (ZOCOR) 20 MG tablet Take 1 tablet (20 mg total) by mouth every evening. 90 tablet 1   No current facility-administered medications on file prior to visit.     ROS see history of  present illness  Objective  Physical Exam Vitals:   05/29/22 1201  BP: 120/64  Pulse: 77  Temp: 97.9 F (36.6 C)  SpO2: 98%    BP Readings from Last 3 Encounters:  05/29/22 120/64  01/28/22 110/70  12/24/21 120/72   Wt Readings from Last 3 Encounters:  05/29/22 122 lb 6.4 oz (55.5 kg)  05/10/22 126 lb (57.2 kg)  01/28/22 126 lb 3.2 oz (57.2 kg)    Physical Exam Constitutional:      General: She is not in acute distress.    Appearance: She is not diaphoretic.  Cardiovascular:     Rate and Rhythm: Normal rate and regular rhythm.     Heart sounds: Normal heart sounds.  Pulmonary:     Effort: Pulmonary effort is normal.     Breath sounds: Normal breath sounds.  Skin:    General: Skin is warm and dry.  Neurological:     Mental Status: She is alert.      Assessment/Plan: Please see individual problem list.  Problem List Items Addressed This Visit     Anxiety and depression (Chronic)    Stable.  She can continue Zoloft 200 mg daily, clonazepam 0.5 mg twice daily as needed for anxiety, and hydroxyzine 25 mg 3 times daily as needed for anxiety.  Discussed that there is probably not that much I  can do for her sleep and that she needs to to work on her husband's snoring.  Her husband is my patient and I encouraged her to have him schedule a visit with me to discuss this.      Hypercholesterolemia (Chronic)    Check lipid panel.  Continue simvastatin 20 mg daily.      Relevant Orders   Comp Met (CMET)   Lipid panel   Hypothyroidism - Primary (Chronic)    Check TSH with labs in 2 weeks.  Continue Synthroid 125 mcg daily.      Relevant Orders   TSH    Return in about 2 weeks (around 06/12/2022) for labs, 3 months PCP.   Tommi Rumps, MD [place]

## 2022-05-29 NOTE — Telephone Encounter (Addendum)
LOV: 01/28/22  NOV: 05/29/22 (today at noon)

## 2022-06-06 ENCOUNTER — Encounter: Payer: Self-pay | Admitting: Family Medicine

## 2022-06-12 ENCOUNTER — Other Ambulatory Visit (INDEPENDENT_AMBULATORY_CARE_PROVIDER_SITE_OTHER): Payer: Medicare Other

## 2022-06-12 DIAGNOSIS — E039 Hypothyroidism, unspecified: Secondary | ICD-10-CM | POA: Diagnosis not present

## 2022-06-12 DIAGNOSIS — E7800 Pure hypercholesterolemia, unspecified: Secondary | ICD-10-CM

## 2022-06-12 LAB — COMPREHENSIVE METABOLIC PANEL
ALT: 14 U/L (ref 0–35)
AST: 21 U/L (ref 0–37)
Albumin: 4.3 g/dL (ref 3.5–5.2)
Alkaline Phosphatase: 53 U/L (ref 39–117)
BUN: 23 mg/dL (ref 6–23)
CO2: 28 mEq/L (ref 19–32)
Calcium: 9.2 mg/dL (ref 8.4–10.5)
Chloride: 107 mEq/L (ref 96–112)
Creatinine, Ser: 0.85 mg/dL (ref 0.40–1.20)
GFR: 66.73 mL/min (ref >60.00)
Glucose, Bld: 84 mg/dL (ref 70–99)
Potassium: 3.8 mEq/L (ref 3.5–5.1)
Sodium: 143 mEq/L (ref 135–145)
Total Bilirubin: 0.5 mg/dL (ref 0.2–1.2)
Total Protein: 6.5 g/dL (ref 6.0–8.3)

## 2022-06-12 LAB — LIPID PANEL
Cholesterol: 203 mg/dL — ABNORMAL HIGH (ref 0–200)
HDL: 74.6 mg/dL (ref >39.00)
LDL Cholesterol: 100 mg/dL — ABNORMAL HIGH (ref 0–99)
NonHDL: 128.1
Total CHOL/HDL Ratio: 3
Triglycerides: 143 mg/dL (ref 0.0–149.0)
VLDL: 28.6 mg/dL (ref 0.0–40.0)

## 2022-06-12 LAB — TSH: TSH: 1.8 u[IU]/mL (ref 0.35–5.50)

## 2022-06-25 ENCOUNTER — Other Ambulatory Visit: Payer: Self-pay | Admitting: Family Medicine

## 2022-06-25 DIAGNOSIS — F419 Anxiety disorder, unspecified: Secondary | ICD-10-CM

## 2022-06-26 DIAGNOSIS — H43813 Vitreous degeneration, bilateral: Secondary | ICD-10-CM | POA: Diagnosis not present

## 2022-06-26 DIAGNOSIS — Z961 Presence of intraocular lens: Secondary | ICD-10-CM | POA: Diagnosis not present

## 2022-07-09 ENCOUNTER — Encounter: Payer: Self-pay | Admitting: Family Medicine

## 2022-07-09 DIAGNOSIS — Z23 Encounter for immunization: Secondary | ICD-10-CM | POA: Diagnosis not present

## 2022-07-14 ENCOUNTER — Encounter: Payer: Self-pay | Admitting: Family Medicine

## 2022-07-16 NOTE — Telephone Encounter (Signed)
Please contact the patient and get her scheduled for follow-up to discuss her sleep.  Thanks.

## 2022-07-16 NOTE — Telephone Encounter (Signed)
Pt sched for 315 tomorrow

## 2022-07-17 ENCOUNTER — Ambulatory Visit: Payer: Medicare Other | Admitting: Family Medicine

## 2022-07-17 ENCOUNTER — Encounter: Payer: Self-pay | Admitting: Family Medicine

## 2022-07-17 NOTE — Telephone Encounter (Signed)
noted 

## 2022-07-17 NOTE — Telephone Encounter (Signed)
Patient called and stated she doesn't need appt today, 07/17/2022. She knows what she needs to do to sleep, She has slept straight through the night for the pass 2 nights. Patient stated she will send a MyChart note to Dr Caryl Bis about what she is doing to sleep.

## 2022-07-25 ENCOUNTER — Other Ambulatory Visit: Payer: Self-pay | Admitting: Family Medicine

## 2022-07-25 DIAGNOSIS — F419 Anxiety disorder, unspecified: Secondary | ICD-10-CM

## 2022-08-07 ENCOUNTER — Other Ambulatory Visit: Payer: Self-pay | Admitting: Dermatology

## 2022-08-07 ENCOUNTER — Encounter: Payer: Self-pay | Admitting: Dermatology

## 2022-08-07 ENCOUNTER — Ambulatory Visit (INDEPENDENT_AMBULATORY_CARE_PROVIDER_SITE_OTHER): Payer: Medicare Other | Admitting: Dermatology

## 2022-08-07 VITALS — BP 98/70 | HR 91

## 2022-08-07 DIAGNOSIS — I781 Nevus, non-neoplastic: Secondary | ICD-10-CM | POA: Diagnosis not present

## 2022-08-07 DIAGNOSIS — L821 Other seborrheic keratosis: Secondary | ICD-10-CM

## 2022-08-07 DIAGNOSIS — C44729 Squamous cell carcinoma of skin of left lower limb, including hip: Secondary | ICD-10-CM

## 2022-08-07 DIAGNOSIS — D492 Neoplasm of unspecified behavior of bone, soft tissue, and skin: Secondary | ICD-10-CM

## 2022-08-07 DIAGNOSIS — C4492 Squamous cell carcinoma of skin, unspecified: Secondary | ICD-10-CM

## 2022-08-07 DIAGNOSIS — L814 Other melanin hyperpigmentation: Secondary | ICD-10-CM

## 2022-08-07 HISTORY — DX: Squamous cell carcinoma of skin, unspecified: C44.92

## 2022-08-07 NOTE — Progress Notes (Signed)
   New Patient Visit  Subjective  Pam Salazar is a 77 y.o. female who presents for the following: Rash (The patient has a rash to be evaluated on her face, sometimes red and will not go away) and Skin Problem (The patient has a spot on her left leg to be evaluated, she treated with otc compound w with a poor response).    The following portions of the chart were reviewed this encounter and updated as appropriate:       Review of Systems:  No other skin or systemic complaints except as noted in HPI or Assessment and Plan.  Objective  Well appearing patient in no apparent distress; mood and affect are within normal limits.  A focused examination was performed including face, nose. Relevant physical exam findings are noted in the Assessment and Plan.  nose  Dilated blood vessels  left lateral lower leg 9.0 mm pink scaly papule          Assessment & Plan  Telangiectasia nose   Benign appearing on exam  Counseling for BBL / IPL / Laser and Coordination of Care Discussed the treatment option of Broad Band Light (BBL) Daron Offer Pulsed Light (IPL)/ Laser for skin discoloration, including brown spots and redness.  Typically we recommend at least 1-3 treatment sessions about 5-8 weeks apart for best results.  Cannot have tanned skin when BBL performed, and regular use of sunscreen is advised after the procedure to help maintain results. The patient's condition may also require "maintenance treatments" in the future.  The fee for BBL / laser treatments is $350 per treatment session for the whole face.  A fee can be quoted for other parts of the body.  Insurance typically does not pay for BBL/laser treatments and therefore the fee is an out-of-pocket cost.   Neoplasm of skin left lateral lower leg  Epidermal / dermal shaving  Lesion diameter (cm):  0.9 Informed consent: discussed and consent obtained   Timeout: patient name, date of birth, surgical site, and procedure  verified   Procedure prep:  Patient was prepped and draped in usual sterile fashion Prep type:  Isopropyl alcohol Anesthesia: the lesion was anesthetized in a standard fashion   Anesthetic:  1% lidocaine w/ epinephrine 1-100,000 buffered w/ 8.4% NaHCO3 Instrument used: flexible razor blade   Hemostasis achieved with: pressure, aluminum chloride and electrodesiccation   Outcome: patient tolerated procedure well   Post-procedure details: sterile dressing applied and wound care instructions given   Dressing type: bandage and petrolatum    Specimen 1 - Surgical pathology Differential Diagnosis: ISK vs wart R/O SCC   Check Margins: No  ISK vs wart R/O SCC  Seborrheic Keratoses Left lower pretibial - Stuck-on, waxy, tan-brown papules and/or plaques  - Benign-appearing - Discussed benign etiology and prognosis. - Observe - Call for any changes  Lentigines - Scattered tan macules - Due to sun exposure - Benign-appearing, observe - Recommend daily broad spectrum sunscreen SPF 30+ to sun-exposed areas, reapply every 2 hours as needed. - Call for any changes   Return pending bx, if symptoms worsen or fail to improve.   I, Marye Round, CMA, am acting as scribe for Brendolyn Patty, MD .   Documentation: I have reviewed the above documentation for accuracy and completeness, and I agree with the above.  Brendolyn Patty MD

## 2022-08-07 NOTE — Patient Instructions (Addendum)
Recommend daily broad spectrum sunscreen SPF 30+ to sun-exposed areas, reapply every 2 hours as needed. Call for new or changing lesions.  Staying in the shade or wearing long sleeves, sun glasses (UVA+UVB protection) and wide brim hats (4-inch brim around the entire circumference of the hat) are also recommended for sun protection.        Wound Care Instructions  Cleanse wound gently with soap and water once a day then pat dry with clean gauze. Apply a thin coat of Petrolatum (petroleum jelly, "Vaseline") over the wound (unless you have an allergy to this). We recommend that you use a new, sterile tube of Vaseline. Do not pick or remove scabs. Do not remove the yellow or white "healing tissue" from the base of the wound.  Cover the wound with fresh, clean, nonstick gauze and secure with paper tape. You may use Band-Aids in place of gauze and tape if the wound is small enough, but would recommend trimming much of the tape off as there is often too much. Sometimes Band-Aids can irritate the skin.  You should call the office for your biopsy report after 1 week if you have not already been contacted.  If you experience any problems, such as abnormal amounts of bleeding, swelling, significant bruising, significant pain, or evidence of infection, please call the office immediately.  FOR ADULT SURGERY PATIENTS: If you need something for pain relief you may take 1 extra strength Tylenol (acetaminophen) AND 2 Ibuprofen (245m each) together every 4 hours as needed for pain. (do not take these if you are allergic to them or if you have a reason you should not take them.) Typically, you may only need pain medication for 1 to 3 days.       Counseling for BBL / IPL / Laser and Coordination of Care Discussed the treatment option of Broad Band Light (BBL) /Intense Pulsed Light (IPL)/ Laser for skin discoloration, including brown spots and redness.  Typically we recommend at least 1-3 treatment sessions  about 5-8 weeks apart for best results.  Cannot have tanned skin when BBL performed, and regular use of sunscreen is advised after the procedure to help maintain results. The patient's condition may also require "maintenance treatments" in the future.  The fee for BBL / laser treatments is $350 per treatment session for the whole face.  A fee can be quoted for other parts of the body.  Insurance typically does not pay for BBL/laser treatments and therefore the fee is an out-of-pocket cost.      Due to recent changes in healthcare laws, you may see results of your pathology and/or laboratory studies on MyChart before the doctors have had a chance to review them. We understand that in some cases there may be results that are confusing or concerning to you. Please understand that not all results are received at the same time and often the doctors may need to interpret multiple results in order to provide you with the best plan of care or course of treatment. Therefore, we ask that you please give u[place]2 business days to thoroughly review all your results before contacting the office for clarification. Should we see a critical lab result, you will be contacted sooner.   If You Need Anything After Your Visit  If you have any questions or concerns for your doctor, please call our main line at [phone] press option 4 to reach your doctor's medical assistant. If no one answers, please leave a voicemail as directed  and we will return your call as soon as possible. Messages left after 4 pm will be answered the following business day.   You may also send [place] a message via [place]. We typically respond to MyChart messages within 1-2 business days.  For prescription refills, please ask your pharmacy to contact our office. Our fax number is [phone].  If you have an urgent issue when the clinic is closed that cannot wait until the next business day, you can page your doctor at the number below.     Please note that while we do our best to be available for urgent issues outside of office hours, we are not available 24/7.   If you have an urgent issue and are unable to reach [place], you may choose to seek medical care at your doctor's office, retail clinic, urgent care center, or emergency room.  If you have a medical emergency, please immediately call 911 or go to the emergency department.  Pager Numbers  - Dr. Nehemiah Massed: [phone]  - Dr. Laurence Ferrari: [phone]  - Dr. Nicole Kindred: [phone]  In the event of inclement weather, please call our main line at [phone] for an update on the status of any delays or closures.  Dermatology Medication Tips: Please keep the boxes that topical medications come in in order to help keep track of the instructions about where and how to use these. Pharmacies typically print the medication instructions only on the boxes and not directly on the medication tubes.   If your medication is too expensive, please contact our office at [phone] option 4 or send [place] a message through [place].   We are unable to tell what your co-pay for medications will be in advance as this is different depending on your insurance coverage. However, we may be able to find a substitute medication at lower cost or fill out paperwork to get insurance to cover a needed medication.   If a prior authorization is required to get your medication covered by your insurance company, please allow [place] 1-2 business days to complete this process.  Drug prices often vary depending on where the prescription is filled and some pharmacies may offer cheaper prices.  The website www.goodrx.com contains coupons for medications through different pharmacies. The prices here do not account for what the cost may be with help from insurance (it may be cheaper with your insurance), but the website can give you the price if you did not use any insurance.  - You can print the associated coupon and take  it with your prescription to the pharmacy.  - You may also stop by our office during regular business hours and pick up a GoodRx coupon card.  - If you need your prescription sent electronically to a different pharmacy, notify our office through [facility] or by phone at [phone] option 4.     Si Usted Necesita Algo Despus de Su Visita  Tambin puede enviarnos un mensaje a travs de Pharmacist, community. Por lo general respondemos a los mensajes de MyChart en el transcurso de 1 a 2 das hbiles.  Para renovar recetas, por favor pida a su farmacia que se ponga en contacto con nuestra oficina. Harland Dingwall de fax es [place] [phone].  Si tiene un asunto urgente cuando la clnica est cerrada y que no puede esperar hasta el siguiente da hbil, puede llamar/localizar a su doctor(a) al nmero que aparece a continuacin.   Por favor, tenga en cuenta que aunque hacemos todo lo posible para estar  disponibles para asuntos urgentes fuera del horario de [place], no estamos disponibles las 24 horas del da, los 7 das de la [place].   Si tiene un problema urgente y no puede comunicarse con nosotros, puede optar por buscar atencin mdica  en el consultorio de su doctor(a), en una clnica privada, en un centro de atencin urgente o en una sala de emergencias.  Si tiene Engineering geologist, por favor llame inmediatamente al 911 o vaya a la sala de emergencias.  Nmeros de bper  - Dr. Nehemiah Massed: [phone]  - Dra. Moye: [phone]  - Dra. Nicole Kindred: [phone]  En caso de inclemencias del [place], por favor llame a Johnsie Kindred principal al [phone] para una actualizacin sobre el [place] de cualquier retraso o cierre.  Consejos para la medicacin en dermatologa: Por favor, guarde las cajas en las que vienen los medicamentos de uso tpico para ayudarle a seguir las instrucciones sobre dnde y cmo usarlos. Las farmacias generalmente imprimen las instrucciones del medicamento slo en las  cajas y no directamente en los tubos del [place].   Si su medicamento es muy caro, por favor, pngase en contacto con Zigmund Daniel llamando al [phone] y presione la opcin 4 o envenos un mensaje a travs de Pharmacist, community.   No podemos decirle cul ser su copago por los medicamentos por adelantado ya que esto es diferente dependiendo de la cobertura de su seguro. Sin embargo, es posible que podamos encontrar un medicamento sustituto a Electrical engineer un formulario para que el seguro cubra el medicamento que se considera necesario.   Si se requiere una autorizacin previa para que su compaa de seguros [place] su medicamento, por favor permtanos de 1 a 2 das hbiles para completar este proceso.  Los precios de los medicamentos varan con frecuencia dependiendo del Environmental consultant de dnde se surte la receta y alguna farmacias pueden ofrecer precios ms baratos.  El sitio web www.goodrx.com tiene cupones para medicamentos de Airline pilot. Los precios aqu no tienen en cuenta lo que podra costar con la ayuda del seguro (puede ser ms barato con su seguro), pero el sitio web puede darle el precio si no utiliz Research scientist (physical sciences).  - Puede imprimir el cupn correspondiente y llevarlo con su receta a la farmacia.  - Tambin puede pasar por nuestra oficina durante el horario de atencin regular y Charity fundraiser una tarjeta de cupones de GoodRx.  - Si necesita que su receta se enve electrnicamente a una farmacia diferente, informe a nuestra oficina a travs de MyChart de [place] o por telfono llamando al [phone] y presione la opcin 4.

## 2022-08-13 ENCOUNTER — Telehealth: Payer: Self-pay

## 2022-08-13 NOTE — Telephone Encounter (Signed)
-----   Message from Brendolyn Patty, MD sent at 08/12/2022  8:58 PM EST ----- Skin , left lateral lower leg WELL DIFFERENTIATED SQUAMOUS CELL CARCINOMA WITH SUPERFICIAL INFILTRATION  SCC skin cancer, needs EDC - please call patient

## 2022-08-13 NOTE — Telephone Encounter (Signed)
Advised patient biopsy of the left lateral lower leg was SCC. EDC scheduled for 09/04/2022 at 11:45 AM.

## 2022-08-14 DIAGNOSIS — M19012 Primary osteoarthritis, left shoulder: Secondary | ICD-10-CM | POA: Diagnosis not present

## 2022-08-16 ENCOUNTER — Other Ambulatory Visit: Payer: Self-pay | Admitting: Family Medicine

## 2022-08-16 DIAGNOSIS — E7800 Pure hypercholesterolemia, unspecified: Secondary | ICD-10-CM

## 2022-08-16 DIAGNOSIS — E039 Hypothyroidism, unspecified: Secondary | ICD-10-CM

## 2022-08-22 ENCOUNTER — Other Ambulatory Visit: Payer: Self-pay | Admitting: Family Medicine

## 2022-08-28 ENCOUNTER — Encounter: Payer: Self-pay | Admitting: Family Medicine

## 2022-08-28 ENCOUNTER — Ambulatory Visit (INDEPENDENT_AMBULATORY_CARE_PROVIDER_SITE_OTHER): Payer: Medicare Other | Admitting: Family Medicine

## 2022-08-28 VITALS — BP 124/78 | HR 72 | Temp 98.0°F | Ht 62.0 in | Wt 128.8 lb

## 2022-08-28 DIAGNOSIS — E039 Hypothyroidism, unspecified: Secondary | ICD-10-CM | POA: Diagnosis not present

## 2022-08-28 DIAGNOSIS — R2689 Other abnormalities of gait and mobility: Secondary | ICD-10-CM | POA: Diagnosis not present

## 2022-08-28 DIAGNOSIS — F32A Depression, unspecified: Secondary | ICD-10-CM | POA: Diagnosis not present

## 2022-08-28 DIAGNOSIS — E7800 Pure hypercholesterolemia, unspecified: Secondary | ICD-10-CM

## 2022-08-28 DIAGNOSIS — F419 Anxiety disorder, unspecified: Secondary | ICD-10-CM | POA: Diagnosis not present

## 2022-08-28 LAB — BASIC METABOLIC PANEL
BUN: 26 mg/dL — ABNORMAL HIGH (ref 6–23)
CO2: 26 mEq/L (ref 19–32)
Calcium: 9.3 mg/dL (ref 8.4–10.5)
Chloride: 106 mEq/L (ref 96–112)
Creatinine, Ser: 0.83 mg/dL (ref 0.40–1.20)
GFR: 68.57 mL/min (ref >60.00)
Glucose, Bld: 76 mg/dL (ref 70–99)
Potassium: 3.7 mEq/L (ref 3.5–5.1)
Sodium: 141 mEq/L (ref 135–145)

## 2022-08-28 NOTE — Progress Notes (Signed)
Tommi Rumps, MD Phone: [phone]  Pam Salazar is a 77 y.o. female who presents today for follow-up.  anxiety/depression: Patient notes that things have worsened over the last 1-1.5 months.  She has been taking Zoloft 200 mg daily.  She self increased her clonazepam to 1 mg twice daily and has also been taking hydroxyzine on a daily basis.  She notes no SI.  She is unsure why her anxiety is worse.  She does note increasing the clonazepam to 1 mg twice daily has been helpful.  Balance difficulty: Patient notes this has been going on since before she increase the clonazepam.  It did worsen around the time the anxiety worsened.  She has some balance exercises that she is try to do at home though has difficulty doing on her own.  She has had no falls.  No alcohol intake.  No numbness in her feet.   Social History   Tobacco Use  Smoking Status Former   Types: Cigarettes  Smokeless Tobacco Former   Quit date: 07/26/2013  Tobacco Comments        Current Outpatient Medications on File Prior to Visit  Medication Sig Dispense Refill   clonazePAM (KLONOPIN) 0.5 MG tablet TAKE ONE TABLET BY MOUTH TWICE A DAY AS NEEDED FOR ANXIETY (Patient taking differently: Patient states she takes 1 mg BID) 60 tablet 0   hydrOXYzine (ATARAX) 25 MG tablet TAKE ONE TABLET BY MOUTH THREE TIMES A DAY AS NEEDED FOR ANXIETY 90 tablet 1   levothyroxine (SYNTHROID) 125 MCG tablet TAKE 1 TABLET BY MOUTH DAILY BEFORE BREAKFAST 90 tablet 1   Multiple Vitamins-Minerals (PRESERVISION AREDS 2 PO) Take 1 capsule by mouth 2 (two) times daily.      sertraline (ZOLOFT) 100 MG tablet TAKE TWO TABLETS BY MOUTH DAILY 180 tablet 1   simvastatin (ZOCOR) 20 MG tablet TAKE ONE TABLET BY MOUTH EVERY EVENING 90 tablet 1   No current facility-administered medications on file prior to visit.     ROS see history of present illness  Objective  Physical Exam Vitals:   08/28/22 1212  BP: 124/78  Pulse: 72  Temp: 98 F  (36.7 C)  SpO2: 99%    BP Readings from Last 3 Encounters:  08/28/22 124/78  08/07/22 98/70  05/29/22 120/64   Wt Readings from Last 3 Encounters:  08/28/22 128 lb 12.8 oz (58.4 kg)  05/29/22 122 lb 6.4 oz (55.5 kg)  05/10/22 126 lb (57.2 kg)    Physical Exam Constitutional:      General: She is not in acute distress.    Appearance: She is not diaphoretic.  Cardiovascular:     Rate and Rhythm: Normal rate and regular rhythm.     Heart sounds: Normal heart sounds.  Pulmonary:     Effort: Pulmonary effort is normal.     Breath sounds: Normal breath sounds.  Skin:    General: Skin is warm and dry.  Neurological:     Mental Status: She is alert.     Comments: CN 3-12 intact, 5/5 strength in bilateral biceps, triceps, grip, quads, hamstrings, plantar and dorsiflexion, sensation to light touch intact in bilateral UE and LE, normal gait, normal finger-nose, normal heel-to-shin, normal rapid alternating movements, negative Romberg, no pronator drift      Assessment/Plan: Please see individual problem list.  Anxiety and depression Assessment & Plan: Chronic issue with current exacerbation.  Has improved since she self increase the clonazepam to 1 mg twice daily.  She will continue  Zoloft 200 mg daily.  For now she can continue clonazepam 1 mg twice daily.  We are discontinuing the hydroxyzine to see if that helps with her balance.  Discussed the options of adding additional medication or seeing psychiatry though we jointly opted to hold off on those things at this time.   Balance disorder Assessment & Plan: Undetermined cause.  Her neurologic exam is reassuring.  Regular check her sodium level today to see if that may be low.  Will have her discontinue the hydroxyzine to see if that is contributing.  I did discuss that we may have to have a discussion regarding the clonazepam if balance continues to be an issue moving forward as the clonazepam could be contributing.  We will refer  for physical therapy for treatment of her balance as well.  Orders: -     Basic metabolic panel -     Ambulatory referral to Physical Therapy   Return in about 6 weeks (around 10/09/2022) for anxiety/depression.   Tommi Rumps, MD [place]

## 2022-08-28 NOTE — Assessment & Plan Note (Signed)
Chronic issue with current exacerbation.  Has improved since she self increase the clonazepam to 1 mg twice daily.  She will continue Zoloft 200 mg daily.  For now she can continue clonazepam 1 mg twice daily.  We are discontinuing the hydroxyzine to see if that helps with her balance.  Discussed the options of adding additional medication or seeing psychiatry though we jointly opted to hold off on those things at this time.

## 2022-08-28 NOTE — Assessment & Plan Note (Addendum)
Undetermined cause.  Her neurologic exam is reassuring.  Regular check her sodium level today to see if that may be low.  Will have her discontinue the hydroxyzine to see if that is contributing.  I did discuss that we may have to have a discussion regarding the clonazepam if balance continues to be an issue moving forward as the clonazepam could be contributing.  We will refer for physical therapy for treatment of her balance as well.

## 2022-08-28 NOTE — Patient Instructions (Addendum)
Nice to see you.  Please stop the hydroxyzine.  PT will call to set up a visit.

## 2022-08-29 ENCOUNTER — Other Ambulatory Visit: Payer: Self-pay

## 2022-08-29 DIAGNOSIS — F32A Depression, unspecified: Secondary | ICD-10-CM

## 2022-08-29 MED ORDER — CLONAZEPAM 0.5 MG PO TABS: 1.0000 mg | ORAL_TABLET | Freq: Two times a day (BID) | ORAL | 0 refills | Status: DC | PRN

## 2022-09-04 ENCOUNTER — Encounter: Payer: Self-pay | Admitting: Dermatology

## 2022-09-04 ENCOUNTER — Ambulatory Visit (INDEPENDENT_AMBULATORY_CARE_PROVIDER_SITE_OTHER): Payer: Medicare Other | Admitting: Dermatology

## 2022-09-04 VITALS — BP 101/63 | HR 85

## 2022-09-04 DIAGNOSIS — L814 Other melanin hyperpigmentation: Secondary | ICD-10-CM | POA: Diagnosis not present

## 2022-09-04 DIAGNOSIS — L821 Other seborrheic keratosis: Secondary | ICD-10-CM | POA: Diagnosis not present

## 2022-09-04 DIAGNOSIS — C44729 Squamous cell carcinoma of skin of left lower limb, including hip: Secondary | ICD-10-CM | POA: Diagnosis not present

## 2022-09-04 DIAGNOSIS — C4492 Squamous cell carcinoma of skin, unspecified: Secondary | ICD-10-CM

## 2022-09-04 NOTE — Progress Notes (Signed)
   Follow-Up Visit   Subjective  Pam Salazar is a 77 y.o. female who presents for the following: Skin Cancer (Left lateral lower leg. Here for treatment of Bx proven SCC).    The following portions of the chart were reviewed this encounter and updated as appropriate:      Review of Systems: No other skin or systemic complaints except as noted in HPI or Assessment and Plan.   Objective  Well appearing patient in no apparent distress; mood and affect are within normal limits.  A focused examination was performed including left lower leg. Relevant physical exam findings are noted in the Assessment and Plan.  Left Lateral Lower Leg Pink healing biopsy site   Assessment & Plan  Squamous cell carcinoma of skin Left Lateral Lower Leg  Destruction of lesion  Destruction method: electrodesiccation and curettage   Informed consent: discussed and consent obtained   Timeout:  patient name, date of birth, surgical site, and procedure verified Anesthesia: the lesion was anesthetized in a standard fashion   Anesthetic:  1% lidocaine w/ epinephrine 1-100,000 buffered w/ 8.4% NaHCO3 Curettage performed in three different directions: Yes   Electrodesiccation performed over the curetted area: Yes   Final wound size (cm):  0.8 Hemostasis achieved with:  pressure, aluminum chloride and electrodesiccation Outcome: patient tolerated procedure well with no complications   Post-procedure details: wound care instructions given   Additional details:  Mupirocin ointment and Bandaid applied   Seborrheic Keratoses - Stuck-on, waxy, tan-brown papules R axilla - Benign-appearing - Discussed benign etiology and prognosis. - Observe - Call for any changes  Lentigines - Scattered tan macules legs - Due to sun exposure - Benign-appearing, observe - Recommend daily broad spectrum sunscreen SPF 30+ to sun-exposed areas, reapply every 2 hours as needed. - Call for any changes   Return for  BBL as scheduled; SCC recheck in 3-6 months.  I, Emelia Salisbury, CMA, am acting as scribe for Brendolyn Patty, MD.  Documentation: I have reviewed the above documentation for accuracy and completeness, and I agree with the above.  Brendolyn Patty MD

## 2022-09-04 NOTE — Patient Instructions (Addendum)
Wound Care Instructions  Cleanse wound gently with soap and water once a day then pat dry with clean gauze. Apply a thin coat of Petrolatum (petroleum jelly, "Vaseline") over the wound (unless you have an allergy to this). We recommend that you use a new, sterile tube of Vaseline. Do not pick or remove scabs. Do not remove the yellow or white "healing tissue" from the base of the wound.  Cover the wound with fresh, clean, nonstick gauze and secure with paper tape. You may use Band-Aids in place of gauze and tape if the wound is small enough, but would recommend trimming much of the tape off as there is often too much. Sometimes Band-Aids can irritate the skin.  You should call the office for your biopsy report after 1 week if you have not already been contacted.  If you experience any problems, such as abnormal amounts of bleeding, swelling, significant bruising, significant pain, or evidence of infection, please call the office immediately.  FOR ADULT SURGERY PATIENTS: If you need something for pain relief you may take 1 extra strength Tylenol (acetaminophen) AND 2 Ibuprofen (200mg each) together every 4 hours as needed for pain. (do not take these if you are allergic to them or if you have a reason you should not take them.) Typically, you may only need pain medication for 1 to 3 days.     Due to recent changes in healthcare laws, you may see results of your pathology and/or laboratory studies on MyChart before the doctors have had a chance to review them. We understand that in some cases there may be results that are confusing or concerning to you. Please understand that not all results are received at the same time and often the doctors may need to interpret multiple results in order to provide you with the best plan of care or course of treatment. Therefore, we ask that you please give us 2 business days to thoroughly review all your results before contacting the office for clarification. Should  we see a critical lab result, you will be contacted sooner.   If You Need Anything After Your Visit  If you have any questions or concerns for your doctor, please call our main line at [phone] and press option 4 to reach your doctor's medical assistant. If no one answers, please leave a voicemail as directed and we will return your call as soon as possible. Messages left after 4 pm will be answered the following business day.   You may also send us a message via MyChart. We typically respond to MyChart messages within 1-2 business days.  For prescription refills, please ask your pharmacy to contact our office. Our fax number is [phone].  If you have an urgent issue when the clinic is closed that cannot wait until the next business day, you can page your doctor at the number below.    Please note that while we do our best to be available for urgent issues outside of office hours, we are not available 24/7.   If you have an urgent issue and are unable to reach us, you may choose to seek medical care at your doctor's office, retail clinic, urgent care center, or emergency room.  If you have a medical emergency, please immediately call 911 or go to the emergency department.  Pager Numbers  - Dr. Kowalski: [phone]  - Dr. Moye: [phone]  - Dr. Stewart: [phone]  In the event of inclement weather, please call our main line at   [phone] for an update on the status of any delays or closures.  Dermatology Medication Tips: Please keep the boxes that topical medications come in in order to help keep track of the instructions about where and how to use these. Pharmacies typically print the medication instructions only on the boxes and not directly on the medication tubes.   If your medication is too expensive, please contact our office at [phone] option 4 or send us a message through MyChart.   We are unable to tell what your co-pay for medications will be in  advance as this is different depending on your insurance coverage. However, we may be able to find a substitute medication at lower cost or fill out paperwork to get insurance to cover a needed medication.   If a prior authorization is required to get your medication covered by your insurance company, please allow us 1-2 business days to complete this process.  Drug prices often vary depending on where the prescription is filled and some pharmacies may offer cheaper prices.  The website www.goodrx.com contains coupons for medications through different pharmacies. The prices here do not account for what the cost may be with help from insurance (it may be cheaper with your insurance), but the website can give you the price if you did not use any insurance.  - You can print the associated coupon and take it with your prescription to the pharmacy.  - You may also stop by our office during regular business hours and pick up a GoodRx coupon card.  - If you need your prescription sent electronically to a different pharmacy, notify our office through [place] MyChart or by phone at [phone] option 4.     Si Usted Necesita Algo Despus de Su Visita  Tambin puede enviarnos un mensaje a travs de MyChart. Por lo general respondemos a los mensajes de MyChart en el transcurso de 1 a 2 das hbiles.  Para renovar recetas, por favor pida a su farmacia que se ponga en contacto con nuestra oficina. Nuestro nmero de fax es el [phone].  Si tiene un asunto urgente cuando la clnica est cerrada y que no puede esperar hasta el siguiente da hbil, puede llamar/localizar a su doctor(a) al nmero que aparece a continuacin.   Por favor, tenga en cuenta que aunque hacemos todo lo posible para estar disponibles para asuntos urgentes fuera del horario de oficina, no estamos disponibles las 24 horas del da, los 7 das de la semana.   Si tiene un problema urgente y no puede comunicarse con nosotros, puede  optar por buscar atencin mdica  en el consultorio de su doctor(a), en una clnica privada, en un centro de atencin urgente o en una sala de emergencias.  Si tiene una emergencia mdica, por favor llame inmediatamente al 911 o vaya a la sala de emergencias.  Nmeros de bper  - Dr. Kowalski: [phone]  - Dra. Moye: [phone]  - Dra. Stewart: [phone]  En caso de inclemencias del tiempo, por favor llame a nuestra lnea principal al [phone] para una actualizacin sobre el estado de cualquier retraso o cierre.  Consejos para la medicacin en dermatologa: Por favor, guarde las cajas en las que vienen los medicamentos de uso tpico para ayudarle a seguir las instrucciones sobre dnde y cmo usarlos. Las farmacias generalmente imprimen las instrucciones del medicamento slo en las cajas y no directamente en los tubos del medicamento.   Si su medicamento es muy caro, por favor, pngase en contacto con   nuestra oficina llamando al [phone] y presione la opcin 4 o envenos un mensaje a travs de MyChart.   No podemos decirle cul ser su copago por los medicamentos por adelantado ya que esto es diferente dependiendo de la cobertura de su seguro. Sin embargo, es posible que podamos encontrar un medicamento sustituto a menor costo o llenar un formulario para que el seguro cubra el medicamento que se considera necesario.   Si se requiere una autorizacin previa para que su compaa de seguros cubra su medicamento, por favor permtanos de 1 a 2 das hbiles para completar este proceso.  Los precios de los medicamentos varan con frecuencia dependiendo del lugar de dnde se surte la receta y alguna farmacias pueden ofrecer precios ms baratos.  El sitio web www.goodrx.com tiene cupones para medicamentos de diferentes farmacias. Los precios aqu no tienen en cuenta lo que podra costar con la ayuda del seguro (puede ser ms barato con su seguro), pero el sitio web puede darle el  precio si no utiliz ningn seguro.  - Puede imprimir el cupn correspondiente y llevarlo con su receta a la farmacia.  - Tambin puede pasar por nuestra oficina durante el horario de atencin regular y recoger una tarjeta de cupones de GoodRx.  - Si necesita que su receta se enve electrnicamente a una farmacia diferente, informe a nuestra oficina a travs de MyChart de [place] o por telfono llamando al [phone] y presione la opcin 4.  

## 2022-09-24 ENCOUNTER — Ambulatory Visit (INDEPENDENT_AMBULATORY_CARE_PROVIDER_SITE_OTHER): Payer: Medicare Other | Admitting: Family Medicine

## 2022-09-24 ENCOUNTER — Encounter: Payer: Self-pay | Admitting: Family Medicine

## 2022-09-24 VITALS — BP 126/80 | HR 77 | Temp 97.6°F | Ht 62.0 in | Wt 128.8 lb

## 2022-09-24 DIAGNOSIS — K5900 Constipation, unspecified: Secondary | ICD-10-CM | POA: Diagnosis not present

## 2022-09-24 DIAGNOSIS — R1310 Dysphagia, unspecified: Secondary | ICD-10-CM | POA: Diagnosis not present

## 2022-09-24 NOTE — Patient Instructions (Signed)
Nice to see you. You can take MiraLAX once daily until you are having good daily bowel movements.  If this is not beneficial over the next couple of weeks please let me know.  Once you start to have good daily bowel movements you can switch over to a fiber supplement. GI should contact you to schedule an evaluation for your trouble swallowing.

## 2022-09-24 NOTE — Progress Notes (Signed)
Tommi Rumps, MD Phone: [phone]  Pam Salazar is a 77 y.o. female who presents today for same day visit.   Constipation: Constipation: Patient notes this started about 2 weeks ago.  She has to take MiraLAX for 3 days to build have a bowel movement.  She notes no diarrhea, vomiting, or blood in her stool.  She does have to strain.  Her bowel movements look normal.  She does report some abdominal cramping with this.  No known food triggers though she notes she had not been eating enough fiber foods previously.  She did start on omega-3 recently.  Reflux: Patient reports an episode of reflux yesterday that occurred while she was walking.  She had oatmeal for breakfast and then regurgitated and had oatmeal, up into her mouth with burning and sour taste.  She had to spit it out.  She reports chronic intermittent dysphagia issues that have been going on for about 10 years.  She reports having an EGD in the distant past prior to this occurring.  She notes if she eats too fast and does not chew well enough things will feel as though they get stuck in her mid chest.  Occasionally she has to regurgitate it out.  Typically does not have reflux symptoms.  Social History   Tobacco Use  Smoking Status Former   Types: Cigarettes  Smokeless Tobacco Former   Quit date: 07/26/2013  Tobacco Comments        Current Outpatient Medications on File Prior to Visit  Medication Sig Dispense Refill   clonazePAM (KLONOPIN) 0.5 MG tablet Take 2 tablets (1 mg total) by mouth 2 (two) times daily as needed for anxiety. 120 tablet 0   levothyroxine (SYNTHROID) 125 MCG tablet TAKE 1 TABLET BY MOUTH DAILY BEFORE BREAKFAST 90 tablet 1   Multiple Vitamins-Minerals (PRESERVISION AREDS 2 PO) Take 1 capsule by mouth 2 (two) times daily.      sertraline (ZOLOFT) 100 MG tablet TAKE TWO TABLETS BY MOUTH DAILY 180 tablet 1   simvastatin (ZOCOR) 20 MG tablet TAKE ONE TABLET BY MOUTH EVERY EVENING 90 tablet 1    hydrOXYzine (ATARAX) 25 MG tablet TAKE ONE TABLET BY MOUTH THREE TIMES A DAY AS NEEDED FOR ANXIETY 90 tablet 1   No current facility-administered medications on file prior to visit.     ROS see history of present illness  Objective  Physical Exam Vitals:   09/24/22 0809  BP: 126/80  Pulse: 77  Temp: 97.6 F (36.4 C)  SpO2: 99%    BP Readings from Last 3 Encounters:  09/24/22 126/80  09/04/22 101/63  08/28/22 124/78   Wt Readings from Last 3 Encounters:  09/24/22 128 lb 12.8 oz (58.4 kg)  08/28/22 128 lb 12.8 oz (58.4 kg)  05/29/22 122 lb 6.4 oz (55.5 kg)    Physical Exam Constitutional:      General: She is not in acute distress. Cardiovascular:     Rate and Rhythm: Normal rate and regular rhythm.  Pulmonary:     Effort: Pulmonary effort is normal.     Breath sounds: Normal breath sounds.  Abdominal:     General: Bowel sounds are normal.     Palpations: Abdomen is soft.     Tenderness: There is no abdominal tenderness.  Neurological:     Mental Status: She is alert.      Assessment/Plan: Please see individual problem list.  Constipation, unspecified constipation type Assessment & Plan: Recent onset issue.  Possibly diet related.  Encouraged adequate fiber intake with enough fruits and vegetables.  Discussed taking MiraLAX once daily.  If she is not having good bowel movements with this in the next several weeks she can let us know.  If she starts to have good bowel movements on a daily basis she can back off on the MiraLAX and start a fiber supplement.   Dysphagia, unspecified type Assessment & Plan: Chronic intermittent issue.  Has not had evaluation by GI for this in the past.  Will refer to GI for further evaluation.  Single episode of reflux yesterday which we will monitor for recurrence.  Orders: -     Ambulatory referral to Gastroenterology     Return if symptoms worsen or fail to improve.   Tommi Rumps, MD [place]

## 2022-09-24 NOTE — Assessment & Plan Note (Signed)
Recent onset issue.  Possibly diet related.  Encouraged adequate fiber intake with enough fruits and vegetables.  Discussed taking MiraLAX once daily.  If she is not having good bowel movements with this in the next several weeks she can let us know.  If she starts to have good bowel movements on a daily basis she can back off on the MiraLAX and start a fiber supplement.

## 2022-09-24 NOTE — Assessment & Plan Note (Signed)
Chronic intermittent issue.  Has not had evaluation by GI for this in the past.  Will refer to GI for further evaluation.  Single episode of reflux yesterday which we will monitor for recurrence.

## 2022-10-04 ENCOUNTER — Other Ambulatory Visit: Payer: Self-pay | Admitting: Family Medicine

## 2022-10-04 DIAGNOSIS — F32A Depression, unspecified: Secondary | ICD-10-CM

## 2022-10-08 ENCOUNTER — Telehealth: Payer: Self-pay

## 2022-10-08 MED ORDER — LACTULOSE 10 GM/15ML PO SOLN: 20.0000 g | Freq: Every day | ORAL | 0 refills | Status: DC | PRN

## 2022-10-08 NOTE — Telephone Encounter (Signed)
Drastic change in bowel habits.  Patient states she has taken Miralax for a week with no results.  Patient states she did take Senacot S and did have one normal stool.  Patient states she is back on Miralax and her belly is swollen but there is no pain.  Yesterday she didn't eat much of anything.  Patient states she is drinking lots of water.  Patient states this is making her really sluggish.  Patient states she did not take Miralax last night because it isn't really helping.  Patient states July 11 is first available with the GI doctor Dr. Marikay Alar referred her to.  Patient states she does not want to wait until July to take care of this.  Patient states she would like to know if Cologuard would help in any way.

## 2022-10-08 NOTE — Telephone Encounter (Signed)
Patient states she has already picked the Lactulose up and she will try that. Patient will let us know if it is helpful.

## 2022-10-08 NOTE — Telephone Encounter (Signed)
Noted. I sent in lactulose for her to try for her BMs. If this is not helpful she will need to follow-up in person. Cologuard would not be useful in this situation.

## 2022-10-09 DIAGNOSIS — M1611 Unilateral primary osteoarthritis, right hip: Secondary | ICD-10-CM | POA: Diagnosis not present

## 2022-10-18 NOTE — Telephone Encounter (Signed)
We will need to discuss the hydroxyzine during a visit. I discontinued it previously as the patient was having balance issues and need to reassess before refilling that medication. We can discuss further at her upcoming visit.
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
## 2022-10-18 NOTE — Telephone Encounter (Signed)
Pt stated that it worked, but takes more than one dose. She has had three doses so far & only went once. Refuses to make an appt & wants to keep the appt on 10/30/22.  Pt wants a refill on the Hydroxyzine. Because she got rid of her old dose. And now she feels like she needs something at night to help with panic attacks/anxiety which driving seems to trigger.

## 2022-10-18 NOTE — Telephone Encounter (Signed)
Called and spoke with pt to inform she stated she has had balance issues even after d/c the hydroxyzine and that she can not survive until May 8th to discuss this medication. Pt states she needs it to survive and that [facility] can send in at least 10 until then.   After consulting with you I was told to inform the pt she needs a sooner appt, pt agreed to have an appt I got her scheduled 4/29 @ 430

## 2022-10-20 ENCOUNTER — Ambulatory Visit
Admission: EM | Admit: 2022-10-20 | Discharge: 2022-10-20 | Disposition: A | Discharge status: Home / Self Care | Payer: Medicare Other | Attending: Physician Assistant | Admitting: Physician Assistant

## 2022-10-20 ENCOUNTER — Ambulatory Visit (INDEPENDENT_AMBULATORY_CARE_PROVIDER_SITE_OTHER): Payer: Medicare Other

## 2022-10-20 DIAGNOSIS — R5383 Other fatigue: Secondary | ICD-10-CM

## 2022-10-20 DIAGNOSIS — R051 Acute cough: Secondary | ICD-10-CM | POA: Diagnosis not present

## 2022-10-20 DIAGNOSIS — B349 Viral infection, unspecified: Secondary | ICD-10-CM

## 2022-10-20 DIAGNOSIS — Z87891 Personal history of nicotine dependence: Secondary | ICD-10-CM | POA: Diagnosis not present

## 2022-10-20 DIAGNOSIS — Z1152 Encounter for screening for COVID-19: Secondary | ICD-10-CM | POA: Insufficient documentation

## 2022-10-20 DIAGNOSIS — J029 Acute pharyngitis, unspecified: Secondary | ICD-10-CM | POA: Diagnosis not present

## 2022-10-20 DIAGNOSIS — R059 Cough, unspecified: Secondary | ICD-10-CM | POA: Diagnosis not present

## 2022-10-20 LAB — GROUP A STREP BY PCR: Group A Strep by PCR: NOT DETECTED

## 2022-10-20 LAB — SARS CORONAVIRUS 2 BY RT PCR: SARS Coronavirus 2 by RT PCR: NEGATIVE

## 2022-10-20 MED ORDER — CHERATUSSIN AC 100-10 MG/5ML PO SOLN: 5.0000 mL | Freq: Four times a day (QID) | ORAL | 0 refills | Status: DC | PRN

## 2022-10-20 NOTE — Discharge Instructions (Signed)
-  Negative COVID and strep.  -Normal chest x-ray -Likely viral illness. Should be improving over next 1-2 weeks.  URI/COLD SYMPTOMS: Your exam today is consistent with a viral illness. Antibiotics are not indicated at this time. Use medications as directed, including cough syrup, nasal saline, and decongestants. Your symptoms should improve over the next few days and resolve within 7-10 days. Increase rest and fluids. F/u if symptoms worsen or predominate such as sore throat, ear pain, productive cough, shortness of breath, or if you develop high fevers or worsening fatigue over the next several days.
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
## 2022-10-20 NOTE — ED Provider Notes (Signed)
[facility]    CSN: [id number] Arrival date & time: 10/20/22  0919      History   Chief Complaint Chief Complaint  Patient presents with   Cough   Generalized Body Aches    HPI Pam Salazar is a 77 y.o. female presenting for approximately 4-day history of cough productive of yellowish-brown sputum, fatigue, body aches, sore and scratchy throat, headaches.  Denies fever, chills, sweats, ear pain, sinus pain, breathing difficulty or wheezing, vomiting or diarrhea.  Denies any sick contacts or known exposure to COVID.  Negative COVID test at home.  Patient says she has tried over-the-counter cough medicines in the past and they are not helpful.  She reports that cough medicine with "a little bit of codeine" helps most.  Has an appointment with her PCP tomorrow but says her symptoms seem to get worse today and she wanted to be seen a little sooner.  No history of asthma or COPD.  No other complaints concerns.  HPI  Past Medical History:  Diagnosis Date   Arthritis    hips, lower back   Basal cell carcinoma    Breast lump 10/21/2011   Overview:  Negative diagnostic mammogram 09/2011    Depression    Fracture of rib of right side 08/17/2013   Hypercholesterolemia    Hypothyroidism    Low blood pressure 10/25/2014   Panic disorder    previous agoraphobia   Seizures (HCC)    febrile - as infant   Shoulder pain, left    bursa   Squamous cell carcinoma of skin    Squamous cell carcinoma of skin 08/07/2022   L lateral lower leg, EDC sched 09/04/22 at 11:45 AM   Vertigo    positional - rare   Vitamin D deficiency     Patient Active Problem List   Diagnosis Date Noted   Constipation 09/24/2022   Dysphagia 09/24/2022   Cough 01/28/2022   Allergic rhinitis 12/24/2021   Balance disorder 06/05/2020   Arthritis 01/15/2019   Menopausal vaginal dryness 05/09/2017   Dyspareunia in female 05/09/2017   Angiodysplasia of intestine with hemorrhage    Idiopathic  colitis    Anxiety and depression 03/07/2013   Hypercholesterolemia 03/07/2013   Hypothyroidism 03/07/2013   Skin cancer 03/07/2013   Nicotine dependence 06/10/2012    Past Surgical History:  Procedure Laterality Date   ABDOMINAL HYSTERECTOMY  1981   partial - secondary to bleeding, ovaries not removed -  vaginal   CATARACT EXTRACTION W/ INTRAOCULAR LENS  IMPLANT, BILATERAL     COLONOSCOPY WITH PROPOFOL N/A 05/05/2015   Procedure: COLONOSCOPY WITH PROPOFOL;  Surgeon: Midge Minium, MD;  Location: [facility];  Service: Endoscopy;  Laterality: N/A;   COLONOSCOPY WITH PROPOFOL N/A 06/26/2021   Procedure: COLONOSCOPY WITH PROPOFOL;  Surgeon: Wyline Mood, MD;  Location: [facility];  Service: Gastroenterology;  Laterality: N/A;   MASS EXCISION Right 12/24/2017   Procedure: EXCISION MASS ( SHIN );  Surgeon: Renford Dills, MD;  Location: [facility];  Service: Vascular;  Laterality: Right;   ORIF FIBULA FRACTURE Right 2003   right lower extremitiy   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   shoulder Left    bone suprs   SHOULDER SURGERY  12/2015   TONSILLECTOMY  1974    OB History     Gravida  3   Para  3   Term  2   Preterm  1   AB  0   Living  2  SAB  0   IAB      Ectopic      Multiple      Live Births  2            Home Medications    Prior to Admission medications   Medication Sig Start Date End Date Taking? Authorizing Provider  clonazePAM (KLONOPIN) 0.5 MG tablet TAKE 2 TABLETS BY MOUTH TWICE A DAY AS NEEDED FOR ANXIETY 10/04/22  Yes Glori Luis, MD  guaiFENesin-codeine (CHERATUSSIN AC) 100-10 MG/5ML syrup Take 5 mLs by mouth 4 (four) times daily as needed for cough or congestion. 10/20/22  Yes Shirlee Latch, PA-C  lactulose (CHRONULAC) 10 GM/15ML solution Take 30 mLs (20 g total) by mouth daily as needed for mild constipation or moderate constipation. 10/08/22  Yes Glori Luis, MD  levothyroxine (SYNTHROID) 125 MCG tablet  TAKE 1 TABLET BY MOUTH DAILY BEFORE BREAKFAST 08/16/22  Yes Glori Luis, MD  Multiple Vitamins-Minerals (PRESERVISION AREDS 2 PO) Take 1 capsule by mouth 2 (two) times daily.    Yes [provider]  sertraline (ZOLOFT) 100 MG tablet TAKE TWO TABLETS BY MOUTH DAILY 04/23/22  Yes Glori Luis, MD  simvastatin (ZOCOR) 20 MG tablet TAKE ONE TABLET BY MOUTH EVERY EVENING 08/16/22  Yes Glori Luis, MD    Family History Family History  Problem Relation Age of Onset   Arthritis Mother    Hyperlipidemia Mother    Arthritis Father    Prostate cancer Father    Breast cancer Sister 4   Heart disease Brother        heart attack   Diabetes Brother    Breast cancer Maternal Aunt 59   Colon cancer Maternal Uncle     Social History Social History   Tobacco Use   Smoking status: Former    Types: Cigarettes   Smokeless tobacco: Former    Quit date: 07/26/2013   Tobacco comments:       Vaping Use   Vaping Use: Never used  Substance Use Topics   Alcohol use: No    Alcohol/week: 0.0 standard drinks of alcohol   Drug use: No     Allergies   Duloxetine hcl and Ezetimibe-simvastatin   Review of Systems Review of Systems  Constitutional:  Positive for fatigue. Negative for chills, diaphoresis and fever.  HENT:  Positive for congestion, rhinorrhea and sore throat. Negative for ear pain, sinus pressure and sinus pain.   Respiratory:  Positive for cough. Negative for chest tightness, shortness of breath and wheezing.   Gastrointestinal:  Negative for abdominal pain, nausea and vomiting.  Musculoskeletal:  Positive for myalgias.  Skin:  Negative for rash.  Neurological:  Positive for headaches. Negative for weakness.  Hematological:  Negative for adenopathy.     Physical Exam Triage Vital Signs ED Triage Vitals  Enc Vitals Group     BP      Pulse      Resp      Temp      Temp src      SpO2      Weight      Height      Head Circumference      Peak Flow       Pain Score      Pain Loc      Pain Edu?      Excl. in GC?    No data found.  Updated Vital Signs BP 96/70 (BP Location: Left Arm)  Pulse 88   Temp 98.5 F (36.9 C) (Oral)   Ht 5\' 2"  (1.575 m)   Wt 123 lb (55.8 kg)   SpO2 95%   BMI 22.50 kg/m   Physical Exam Vitals and nursing note reviewed.  Constitutional:      General: She is not in acute distress.    Appearance: Normal appearance. She is ill-appearing. She is not toxic-appearing.  HENT:     Head: Normocephalic and atraumatic.     Right Ear: Tympanic membrane, ear canal and external ear normal.     Left Ear: Tympanic membrane, ear canal and external ear normal.     Nose: Congestion present.     Mouth/Throat:     Mouth: Mucous membranes are moist.     Pharynx: Oropharynx is clear. Posterior oropharyngeal erythema present.  Eyes:     General: No scleral icterus.       Right eye: No discharge.        Left eye: No discharge.     Conjunctiva/sclera: Conjunctivae normal.  Cardiovascular:     Rate and Rhythm: Normal rate and regular rhythm.     Heart sounds: Normal heart sounds.  Pulmonary:     Effort: Pulmonary effort is normal. No respiratory distress.     Breath sounds: Normal breath sounds. No wheezing, rhonchi or rales.  Musculoskeletal:     Cervical back: Neck supple.  Skin:    General: Skin is dry.  Neurological:     General: No focal deficit present.     Mental Status: She is alert. Mental status is at baseline.     Motor: No weakness.     Gait: Gait normal.  Psychiatric:        Mood and Affect: Mood normal.        Behavior: Behavior normal.        Thought Content: Thought content normal.      UC Treatments / Results  Labs (all labs ordered are listed, but only abnormal results are displayed) Labs Reviewed  GROUP A STREP BY PCR  SARS CORONAVIRUS 2 BY RT PCR    EKG   Radiology DG Chest 2 View  Result Date: 10/20/2022 CLINICAL DATA:  Cough and congestion for 4 days. EXAM: CHEST - 2 VIEW  COMPARISON:  04/10/2017 FINDINGS: Heart size and mediastinal contours appear normal. No pleural effusion or edema identified. No airspace opacities identified. The visualized osseous structures appear unremarkable. IMPRESSION: No active cardiopulmonary disease. Electronically Signed   By: Signa Kell M.D.   On: 10/20/2022 10:09    Procedures Procedures (including critical care time)  Medications Ordered in UC Medications - No data to display  Initial Impression / Assessment and Plan / UC Course  I have reviewed the triage vital signs and the nursing notes.  Pertinent labs & imaging results that were available during my care of the patient were reviewed by me and considered in my medical decision making (see chart for details).   77 year old female presents with 4-day history of fatigue, bodies, aches, cough, congestion, sore throat.  Negative COVID test at home.  Vitals normal and stable.  Patient ill-appearing but nontoxic.  Resting comfortably on exam bed.  On exam has nasal congestion and erythema posterior pharynx.  Chest clear auscultation heart regular rate and rhythm.  COVID testing and strep testing obtained.  Also obtain chest x-ray given patient's concern for possible pneumonia.  This x-ray is within normal limits.  COVID and strep testing negative.  Discussed all results  with patient.  Advised patient symptoms to sound viral illness.  She says she is agreeable to that.  We discussed use of Cheratussin.  I reviewed controlled substance database and found patient to be low risk for abuse.  Use as needed for severe cough.  Encouraged her to increase rest and fluids pain explained that symptoms can last for a week or 2 or sometimes longer for most in her chest but she should return for fever, worsening cough, shortness of breath, etc.   Final Clinical Impressions(s) / UC Diagnoses   Final diagnoses:  Viral illness  Acute cough  Sore throat  Other fatigue     Discharge  Instructions      -Negative COVID and strep.  -Normal chest x-ray -Likely viral illness. Should be improving over next 1-2 weeks.  URI/COLD SYMPTOMS: Your exam today is consistent with a viral illness. Antibiotics are not indicated at this time. Use medications as directed, including cough syrup, nasal saline, and decongestants. Your symptoms should improve over the next few days and resolve within 7-10 days. Increase rest and fluids. F/u if symptoms worsen or predominate such as sore throat, ear pain, productive cough, shortness of breath, or if you develop high fevers or worsening fatigue over the next several days.       ED Prescriptions     Medication Sig Dispense Auth. Provider   guaiFENesin-codeine (CHERATUSSIN AC) 100-10 MG/5ML syrup Take 5 mLs by mouth 4 (four) times daily as needed for cough or congestion. 118 mL Shirlee Latch, PA-C      I have reviewed the PDMP during this encounter.   Shirlee Latch, PA-C 10/20/22 1038

## 2022-10-21 ENCOUNTER — Ambulatory Visit: Payer: Medicare Other | Admitting: Dermatology

## 2022-10-21 ENCOUNTER — Encounter: Payer: Self-pay | Admitting: Family Medicine

## 2022-10-21 ENCOUNTER — Ambulatory Visit (INDEPENDENT_AMBULATORY_CARE_PROVIDER_SITE_OTHER): Payer: Medicare Other | Admitting: Family Medicine

## 2022-10-21 VITALS — BP 96/58 | HR 77 | Temp 97.3°F | Ht 62.0 in | Wt 124.0 lb

## 2022-10-21 DIAGNOSIS — F419 Anxiety disorder, unspecified: Secondary | ICD-10-CM | POA: Diagnosis not present

## 2022-10-21 DIAGNOSIS — F32A Depression, unspecified: Secondary | ICD-10-CM

## 2022-10-21 DIAGNOSIS — B9789 Other viral agents as the cause of diseases classified elsewhere: Secondary | ICD-10-CM | POA: Diagnosis not present

## 2022-10-21 DIAGNOSIS — J069 Acute upper respiratory infection, unspecified: Secondary | ICD-10-CM | POA: Insufficient documentation

## 2022-10-21 DIAGNOSIS — K5900 Constipation, unspecified: Secondary | ICD-10-CM

## 2022-10-21 DIAGNOSIS — J988 Other specified respiratory disorders: Secondary | ICD-10-CM

## 2022-10-21 MED ORDER — HYDROXYZINE HCL 10 MG PO TABS
10.0000 mg | ORAL_TABLET | Freq: Every day | ORAL | 0 refills | Status: DC | PRN
Start: 2022-10-21 — End: 2022-11-01

## 2022-10-21 MED ORDER — BUSPIRONE HCL 7.5 MG PO TABS
7.5000 mg | ORAL_TABLET | Freq: Two times a day (BID) | ORAL | 2 refills | Status: DC
Start: 2022-10-21 — End: 2022-12-25

## 2022-10-21 NOTE — Assessment & Plan Note (Signed)
Improved.  She will continue fiber supplementation.
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
## 2022-10-21 NOTE — Assessment & Plan Note (Signed)
Chronic issue with current exacerbation.  She will continue Zoloft 100 mg daily and clonazepam 1 mg twice daily.  We will have her restart hydroxyzine 10 mg once daily as needed for anxiety.  Will also start on buspirone 7.5 mg twice daily.  If she notices any side effects with this she will let us know.  Discussed it may take some time for that become beneficial.  I will see her back in 2 months.

## 2022-10-21 NOTE — Assessment & Plan Note (Signed)
Improving.  She will monitor for any worsening symptoms.  If she runs out of cough medication in the next several days she will let us know.

## 2022-10-22 ENCOUNTER — Other Ambulatory Visit: Payer: Self-pay | Admitting: Family Medicine

## 2022-10-23 ENCOUNTER — Telehealth: Payer: Self-pay | Admitting: Family Medicine

## 2022-10-23 MED ORDER — CHERATUSSIN AC 100-10 MG/5ML PO SOLN: 5.0000 mL | Freq: Four times a day (QID) | ORAL | 0 refills | Status: DC | PRN

## 2022-10-23 NOTE — Addendum Note (Signed)
Addended by: Glori Luis on: 10/23/2022 11:31 AM   Modules accepted: Orders

## 2022-10-23 NOTE — Telephone Encounter (Signed)
I have sent a refill in to the pharmacy.

## 2022-10-23 NOTE — Telephone Encounter (Signed)
Prescription Request  10/23/2022  LOV: 10/21/2022  What is the name of the medication or equipment? Robitussin. Patient dropped the bottle of Robitussin on the floor and she lost over half of the medication. Could Dr Birdie Sons call in more?  Have you contacted your pharmacy to request a refill? No   Which pharmacy would you like this sent to?  [facility] [id number] [address], [place] - [address] [place] [address] Phone: [phone] Fax: [phone]    Patient notified that their request is being sent to the clinical staff for review and that they should receive a response within 2 business days.   Please advise at [facility] [phone]

## 2022-10-26 ENCOUNTER — Ambulatory Visit
Admission: EM | Admit: 2022-10-26 | Discharge: 2022-10-26 | Disposition: A | Discharge status: Home / Self Care | Payer: Medicare Other | Attending: Family Medicine | Admitting: Family Medicine

## 2022-10-26 DIAGNOSIS — R051 Acute cough: Secondary | ICD-10-CM | POA: Diagnosis not present

## 2022-10-26 DIAGNOSIS — Z87891 Personal history of nicotine dependence: Secondary | ICD-10-CM

## 2022-10-26 DIAGNOSIS — J40 Bronchitis, not specified as acute or chronic: Secondary | ICD-10-CM

## 2022-10-26 MED ORDER — LEVOFLOXACIN 750 MG PO TABS: 750.0000 mg | ORAL_TABLET | Freq: Every day | ORAL | 0 refills | Status: DC

## 2022-10-26 MED ORDER — HYDROCODONE BIT-HOMATROP MBR 5-1.5 MG/5ML PO SOLN: 5.0000 mL | Freq: Four times a day (QID) | ORAL | 0 refills | Status: DC | PRN

## 2022-10-26 MED ORDER — PREDNISONE 20 MG PO TABS: 40.0000 mg | ORAL_TABLET | Freq: Every day | ORAL | 0 refills | Status: AC

## 2022-10-26 MED ORDER — ALBUTEROL SULFATE HFA 108 (90 BASE) MCG/ACT IN AERS: 2.0000 | INHALATION_SPRAY | RESPIRATORY_TRACT | 0 refills | Status: DC | PRN

## 2022-10-26 NOTE — ED Triage Notes (Signed)
Pt c/o cough,wheezing & sob at times x9 days. Was seen on 10/20/22 for the same issue, was tested for covid & strep which were both neg & was given Cheratussin w/o relief. Denies any chest pain.

## 2022-10-26 NOTE — Discharge Instructions (Signed)
Stop by the pharmacy to pick up your prescriptions.  If your symptoms do not improve after completing your antibiotics and steroids, follow-up with your primary care doctor.

## 2022-10-26 NOTE — ED Provider Notes (Signed)
[facility]    CSN: [id number] Arrival date & time: 10/26/22  1044      History   Chief Complaint Chief Complaint  Patient presents with   Cough    HPI Shunna Busam is a 77 y.o. female.   HPI   Sylivia presents for ongoing cough for the past 9 days.  Has been feeling like she is wheezing and short of breath at times.  She was seen here last week and told that she had a viral respiratory infection.  She was prescribed a cough syrup which has not been helping.  Denies fever.  Previous COVID and strep test were negative.      Past Medical History:  Diagnosis Date   Arthritis    hips, lower back   Basal cell carcinoma    Breast lump 10/21/2011   Overview:  Negative diagnostic mammogram 09/2011    Depression    Fracture of rib of right side 08/17/2013   Hypercholesterolemia    Hypothyroidism    Low blood pressure 10/25/2014   Panic disorder    previous agoraphobia   Seizures (HCC)    febrile - as infant   Shoulder pain, left    bursa   Squamous cell carcinoma of skin    Squamous cell carcinoma of skin 08/07/2022   L lateral lower leg, EDC sched 09/04/22 at 11:45 AM   Vertigo    positional - rare   Vitamin D deficiency     Patient Active Problem List   Diagnosis Date Noted   Viral respiratory illness 10/21/2022   Constipation 09/24/2022   Dysphagia 09/24/2022   Cough 01/28/2022   Allergic rhinitis 12/24/2021   Balance disorder 06/05/2020   Arthritis 01/15/2019   Menopausal vaginal dryness 05/09/2017   Dyspareunia in female 05/09/2017   Angiodysplasia of intestine with hemorrhage    Idiopathic colitis    Anxiety and depression 03/07/2013   Hypercholesterolemia 03/07/2013   Hypothyroidism 03/07/2013   Skin cancer 03/07/2013   Nicotine dependence 06/10/2012    Past Surgical History:  Procedure Laterality Date   ABDOMINAL HYSTERECTOMY  1981   partial - secondary to bleeding, ovaries not removed -  vaginal   CATARACT EXTRACTION W/  INTRAOCULAR LENS  IMPLANT, BILATERAL     COLONOSCOPY WITH PROPOFOL N/A 05/05/2015   Procedure: COLONOSCOPY WITH PROPOFOL;  Surgeon: Midge Minium, MD;  Location: [facility];  Service: Endoscopy;  Laterality: N/A;   COLONOSCOPY WITH PROPOFOL N/A 06/26/2021   Procedure: COLONOSCOPY WITH PROPOFOL;  Surgeon: Wyline Mood, MD;  Location: [facility];  Service: Gastroenterology;  Laterality: N/A;   MASS EXCISION Right 12/24/2017   Procedure: EXCISION MASS ( SHIN );  Surgeon: Renford Dills, MD;  Location: [facility] ORS;  Service: Vascular;  Laterality: Right;   ORIF FIBULA FRACTURE Right 2003   right lower extremitiy   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   shoulder Left    bone suprs   SHOULDER SURGERY  12/2015   TONSILLECTOMY  1974    OB History     Gravida  3   Para  3   Term  2   Preterm  1   AB  0   Living  2      SAB  0   IAB      Ectopic      Multiple      Live Births  2            Home Medications    Prior  to Admission medications   Medication Sig Start Date End Date Taking? Authorizing Provider  albuterol (VENTOLIN HFA) 108 (90 Base) MCG/ACT inhaler Inhale 2 puffs into the lungs every 4 (four) hours as needed. 10/26/22  Yes Amyla Heffner, DO  busPIRone (BUSPAR) 7.5 MG tablet Take 1 tablet (7.5 mg total) by mouth 2 (two) times daily. 10/21/22  Yes Glori Luis, MD  clonazePAM (KLONOPIN) 0.5 MG tablet TAKE 2 TABLETS BY MOUTH TWICE A DAY AS NEEDED FOR ANXIETY 10/04/22  Yes Glori Luis, MD  HYDROcodone bit-homatropine (HYCODAN) 5-1.5 MG/5ML syrup Take 5 mLs by mouth every 6 (six) hours as needed for cough. 10/26/22  Yes Ismaeel Arvelo, DO  hydrOXYzine (ATARAX) 10 MG tablet Take 1 tablet (10 mg total) by mouth daily as needed for anxiety. 10/21/22  Yes Glori Luis, MD  lactulose (CHRONULAC) 10 GM/15ML solution Take 30 mLs (20 g total) by mouth daily as needed for mild constipation or moderate constipation. 10/08/22  Yes Glori Luis, MD  levofloxacin (LEVAQUIN) 750 MG tablet Take 1 tablet (750 mg total) by mouth daily. 10/26/22  Yes Timtohy Broski, Seward Meth, DO  levothyroxine (SYNTHROID) 125 MCG tablet TAKE 1 TABLET BY MOUTH DAILY BEFORE BREAKFAST 08/16/22  Yes Glori Luis, MD  Multiple Vitamins-Minerals (PRESERVISION AREDS 2 PO) Take 1 capsule by mouth 2 (two) times daily.    Yes [provider]  predniSONE (DELTASONE) 20 MG tablet Take 2 tablets (40 mg total) by mouth daily for 5 days. 10/26/22 10/31/22 Yes Celise Bazar, Seward Meth, DO  sertraline (ZOLOFT) 100 MG tablet TAKE 2 TABLETS BY MOUTH DAILY 10/22/22  Yes Glori Luis, MD  simvastatin (ZOCOR) 20 MG tablet TAKE ONE TABLET BY MOUTH EVERY EVENING 08/16/22  Yes Glori Luis, MD    Family History Family History  Problem Relation Age of Onset   Arthritis Mother    Hyperlipidemia Mother    Arthritis Father    Prostate cancer Father    Breast cancer Sister 20   Heart disease Brother        heart attack   Diabetes Brother    Breast cancer Maternal Aunt 68   Colon cancer Maternal Uncle     Social History Social History   Tobacco Use   Smoking status: Former    Types: Cigarettes   Smokeless tobacco: Former    Quit date: 07/26/2013   Tobacco comments:       Vaping Use   Vaping Use: Never used  Substance Use Topics   Alcohol use: No    Alcohol/week: 0.0 standard drinks of alcohol   Drug use: No     Allergies   Duloxetine hcl and Ezetimibe-simvastatin   Review of Systems Review of Systems: negative unless otherwise stated in HPI.      Physical Exam Triage Vital Signs ED Triage Vitals  Enc Vitals Group     BP 10/26/22 1056 (!) 88/62     Pulse Rate 10/26/22 1056 78     Resp 10/26/22 1056 16     Temp 10/26/22 1056 98.4 F (36.9 C)     Temp Source 10/26/22 1056 Oral     SpO2 10/26/22 1056 98 %     Weight 10/26/22 1056 124 lb (56.2 kg)     Height 10/26/22 1056 5\' 2"  (1.575 m)     Head Circumference --      Peak Flow --      Pain Score  10/26/22 1100 5     Pain Loc --  Pain Edu? --      Excl. in GC? --    No data found.  Updated Vital Signs BP (!) 94/58 (BP Location: Right Arm)   Pulse 73   Temp 98.4 F (36.9 C) (Oral)   Resp 16   Ht 5\' 2"  (1.575 m)   Wt 56.2 kg   SpO2 98%   BMI 22.68 kg/m   Visual Acuity Right Eye Distance:   Left Eye Distance:   Bilateral Distance:    Right Eye Near:   Left Eye Near:    Bilateral Near:     Physical Exam GEN:     alert, non-toxic appearing elderly female in no distress    HENT:  mucus membranes moist, oropharyngeal without erythema, lesions or exudate, no tonsillar hypertrophy, + nasal discharge, bilateral TM normal EYES:   pupils equal and reactive, no scleral injection or discharge NECK:  normal ROM  RESP:  no increased work of breathing, diffuse rhonchi, no wheezing CVS:   regular rate and rhythm Skin:   warm and dry, no rash on visible skin    UC Treatments / Results  Labs (all labs ordered are listed, but only abnormal results are displayed) Labs Reviewed - No data to display  EKG   Radiology No results found.  Procedures Procedures (including critical care time)  Medications Ordered in UC Medications - No data to display  Initial Impression / Assessment and Plan / UC Course  I have reviewed the triage vital signs and the nursing notes.  Pertinent labs & imaging results that were available during my care of the patient were reviewed by me and considered in my medical decision making (see chart for details).      Pt is a 77 y.o. female who presents for 1-2 weeks of cough that is not improving.  Hadlyn is afebrile here without recent antipyretics. Satting well on room air. Overall pt is  non-toxic appearing, well hydrated, without respiratory distress. Pulmonary exam is remarkable for rhonchi that clear with cough.  After shared decision making, we will not pursue chest x-ray at this time as it currently would not change management.  COVID  and  strep testing were negative on her 10/20/2022 visit.   Treat acute bronchitis with steroids and antibiotics as below.  Hycodan cough syrup given for cough and allow patient to rest.  Albuterol for bronchospasm. Per chart review, patient smoked half a pack a day for 50 years.  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  Typical duration of symptoms discussed. Return and ED precautions given and patient voiced understanding.   Discussed MDM, treatment plan and plan for follow-up with patient who agrees with plan.     Final Clinical Impressions(s) / UC Diagnoses   Final diagnoses:  Acute cough  Former smoker     Discharge Instructions      Stop by the pharmacy to pick up your prescriptions.  If your symptoms do not improve after completing your antibiotics and steroids, follow-up with your primary care doctor.       ED Prescriptions     Medication Sig Dispense Auth. Provider   HYDROcodone bit-homatropine (HYCODAN) 5-1.5 MG/5ML syrup Take 5 mLs by mouth every 6 (six) hours as needed for cough. 120 mL Antonino Nienhuis, DO   predniSONE (DELTASONE) 20 MG tablet Take 2 tablets (40 mg total) by mouth daily for 5 days. 10 tablet Keagen Heinlen, DO   levofloxacin (LEVAQUIN) 750 MG tablet Take 1 tablet (750 mg total)  by mouth daily. 5 tablet Gianella Chismar, DO   albuterol (VENTOLIN HFA) 108 (90 Base) MCG/ACT inhaler Inhale 2 puffs into the lungs every 4 (four) hours as needed. 6.7 g Katha Cabal, DO      PDMP not reviewed this encounter.   Katha Cabal, DO 10/26/22 1221

## 2022-10-28 ENCOUNTER — Encounter: Payer: Self-pay | Admitting: Family Medicine

## 2022-10-28 NOTE — Telephone Encounter (Signed)
Pt scheduled to see Dr. Birdie Sons 10/31/22.  Will call tomorrow if she is not feeling any better.

## 2022-10-28 NOTE — Telephone Encounter (Signed)
Patient should be seen by someone for reevaluation. If not improving some by tomorrow she should be seen tomorrow. If improving some tomorrow she could see me later in the week. If her symptoms worsen she should be seen right away.

## 2022-10-28 NOTE — Telephone Encounter (Signed)
Called to check on pt following MyChart message.   She was seen at urgent Care 10/26/22, started on levofloxacin, prednisone 40mg  x 5 days and albuterol. Still not feeling well, has no energy and productive cough.  She has 2 more days of antibiotics and steroid.   Pt is not able to tolerate hycodan cough syrup due to constipation.  She tried Miralax and dulcolax suppository but ended up disimpacting herself Sunday 10/27/22. Pt reports having a "normal" BM today.  She is taking OTC Robitussin for cough.   Would you like the pt scheduled for another visit or is there something else?  She did not sound good over the phone.

## 2022-10-30 ENCOUNTER — Ambulatory Visit: Payer: Medicare Other | Admitting: Family Medicine

## 2022-10-31 ENCOUNTER — Ambulatory Visit: Payer: Medicare Other | Admitting: Family Medicine

## 2022-11-01 ENCOUNTER — Ambulatory Visit (INDEPENDENT_AMBULATORY_CARE_PROVIDER_SITE_OTHER): Payer: Medicare Other | Admitting: Nurse Practitioner

## 2022-11-01 ENCOUNTER — Encounter: Payer: Self-pay | Admitting: Nurse Practitioner

## 2022-11-01 VITALS — BP 86/62 | HR 90 | Temp 97.7°F | Ht 62.0 in | Wt 126.6 lb

## 2022-11-01 DIAGNOSIS — M779 Enthesopathy, unspecified: Secondary | ICD-10-CM

## 2022-11-01 MED ORDER — PREDNISONE 10 MG PO TABS: ORAL_TABLET | ORAL | 0 refills | Status: DC

## 2022-11-01 NOTE — Progress Notes (Signed)
Established Patient Office Visit  Subjective:  Patient ID: Pam Salazar, female    DOB: [birth year]  Age: 77 y.o. MRN: [id number]  CC:  Chief Complaint  Patient presents with   Pain    Left foot pain    HPI  Pam Salazar presents for pain and redness around the Achilles tendon.  Patient send states that she recently finished Levaquin and prednisone due to acute bronchitis. He states that after completing the Levaquin she experienced pain in her Achilles tendon area.  HPI   Past Medical History:  Diagnosis Date   Arthritis    hips, lower back   Basal cell carcinoma    Breast lump 10/21/2011   Overview:  Negative diagnostic mammogram 09/2011    Depression    Fracture of rib of right side 08/17/2013   Hypercholesterolemia    Hypothyroidism    Low blood pressure 10/25/2014   Panic disorder    previous agoraphobia   Seizures (HCC)    febrile - as infant   Shoulder pain, left    bursa   Squamous cell carcinoma of skin    Squamous cell carcinoma of skin 08/07/2022   L lateral lower leg, EDC sched 09/04/22 at 11:45 AM   Vertigo    positional - rare   Vitamin D deficiency     Past Surgical History:  Procedure Laterality Date   ABDOMINAL HYSTERECTOMY  1981   partial - secondary to bleeding, ovaries not removed -  vaginal   CATARACT EXTRACTION W/ INTRAOCULAR LENS  IMPLANT, BILATERAL     COLONOSCOPY WITH PROPOFOL N/A 05/05/2015   Procedure: COLONOSCOPY WITH PROPOFOL;  Surgeon: Midge Minium, MD;  Location: [facility];  Service: Endoscopy;  Laterality: N/A;   COLONOSCOPY WITH PROPOFOL N/A 06/26/2021   Procedure: COLONOSCOPY WITH PROPOFOL;  Surgeon: Wyline Mood, MD;  Location: [facility] END[facility]COPY;  Service: Gastroenterology;  Laterality: N/A;   MASS EXCISION Right 12/24/2017   Procedure: EXCISION MASS ( SHIN );  Surgeon: Renford Dills, MD;  Location: [facility] ORS;  Service: Vascular;  Laterality: Right;   ORIF FIBULA FRACTURE Right 2003   right lower  extremitiy   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   shoulder Left    bone suprs   SHOULDER SURGERY  12/2015   TONSILLECTOMY  1974    Family History  Problem Relation Age of Onset   Arthritis Mother    Hyperlipidemia Mother    Arthritis Father    Prostate cancer Father    Breast cancer Sister 37   Heart disease Brother        heart attack   Diabetes Brother    Breast cancer Maternal Aunt 29   Colon cancer Maternal Uncle     Social History   Socioeconomic History   Marital status: Married    Spouse name: Not on file   Number of children: Not on file   Years of education: Not on file   Highest education level: Bachelor's degree (e.g., BA, AB, BS)  Occupational History   Not on file  Tobacco Use   Smoking status: Former    Types: Cigarettes   Smokeless tobacco: Former    Quit date: 07/26/2013   Tobacco comments:       Vaping Use   Vaping Use: Never used  Substance and Sexual Activity   Alcohol use: No    Alcohol/week: 0.0 standard drinks of alcohol   Drug use: No   Sexual activity: Yes    Birth control/protection:  Post-menopausal  Other Topics Concern   Not on file  Social History Narrative   Former Engineer, civil (consulting)   Social Determinants of Health   Financial Resource Strain: Medium Risk (09/23/2022)   Overall Financial Resource Strain (CARDIA)    Difficulty of Paying Living Expenses: Somewhat hard  Food Insecurity: No Food Insecurity (09/23/2022)   Hunger Vital Sign    Worried About Running Out of Food in the Last Year: Never true    Ran Out of Food in the Last Year: Never true  Transportation Needs: No Transportation Needs (09/23/2022)   PRAPARE - Administrator, Civil Service (Medical): No    Lack of Transportation (Non-Medical): No  Physical Activity: Sufficiently Active (09/23/2022)   Exercise Vital Sign    Days of Exercise per Week: 5 days    Minutes of Exercise per Session: 30 min  Stress: No Stress Concern Present (09/23/2022)   [employer] of  Occupational Health - Occupational Stress Questionnaire    Feeling of Stress : Only a little  Social Connections: Socially Integrated (09/23/2022)   Social Connection and Isolation Panel [NHANES]    Frequency of Communication with Friends and Family: More than three times a week    Frequency of Social Gatherings with Friends and Family: Once a week    Attends Religious Services: More than 4 times per year    Active Member of [employer] or Organizations: Yes    Attends Engineer, structural: More than 4 times per year    Marital Status: Married  Catering manager Violence: Not At Risk (05/10/2022)   Humiliation, Afraid, Rape, and Kick questionnaire    Fear of Current or Ex-Partner: No    Emotionally Abused: No    Physically Abused: No    Sexually Abused: No     Outpatient Medications Prior to Visit  Medication Sig Dispense Refill   busPIRone (BUSPAR) 7.5 MG tablet Take 1 tablet (7.5 mg total) by mouth 2 (two) times daily. 60 tablet 2   lactulose (CHRONULAC) 10 GM/15ML solution Take 30 mLs (20 g total) by mouth daily as needed for mild constipation or moderate constipation. 236 mL 0   levofloxacin (LEVAQUIN) 750 MG tablet Take 1 tablet (750 mg total) by mouth daily. 5 tablet 0   levothyroxine (SYNTHROID) 125 MCG tablet TAKE 1 TABLET BY MOUTH DAILY BEFORE BREAKFAST 90 tablet 1   Multiple Vitamins-Minerals (PRESERVISION AREDS 2 PO) Take 1 capsule by mouth 2 (two) times daily.      sertraline (ZOLOFT) 100 MG tablet TAKE 2 TABLETS BY MOUTH DAILY 180 tablet 1   simvastatin (ZOCOR) 20 MG tablet TAKE ONE TABLET BY MOUTH EVERY EVENING 90 tablet 1   clonazePAM (KLONOPIN) 0.5 MG tablet TAKE 2 TABLETS BY MOUTH TWICE A DAY AS NEEDED FOR ANXIETY 120 tablet 0   albuterol (VENTOLIN HFA) 108 (90 Base) MCG/ACT inhaler Inhale 2 puffs into the lungs every 4 (four) hours as needed. 6.7 g 0   HYDROcodone bit-homatropine (HYCODAN) 5-1.5 MG/5ML syrup Take 5 mLs by mouth every 6 (six) hours as needed for cough.  120 mL 0   hydrOXYzine (ATARAX) 10 MG tablet Take 1 tablet (10 mg total) by mouth daily as needed for anxiety. 30 tablet 0   No facility-administered medications prior to visit.    Allergies  Allergen Reactions   Duloxetine Hcl Other (See Comments)    depression worsened   Ezetimibe-Simvastatin Other (See Comments)    Joint pain    ROS Review of Systems  Constitutional: Negative.   Eyes: Negative.   Respiratory: Negative.    Musculoskeletal: Negative.        Left foot pain  Neurological: Negative.       Objective:    Physical Exam Constitutional:      Appearance: Normal appearance.  Cardiovascular:     Rate and Rhythm: Normal rate and regular rhythm.     Pulses: Normal pulses.     Heart sounds: Normal heart sounds.  Pulmonary:     Effort: Pulmonary effort is normal.     Breath sounds: No stridor. No wheezing.  Musculoskeletal:        General: Tenderness (area above the left  heel near the achilless tendon area.) present.  Neurological:     General: No focal deficit present.     Mental Status: She is alert and oriented to person, place, and time. Mental status is at baseline.  Psychiatric:        Mood and Affect: Mood normal.        Behavior: Behavior normal.        Thought Content: Thought content normal.        Judgment: Judgment normal.     BP (!) 86/62   Pulse 90   Temp 97.7 F (36.5 C) (Oral)   Ht 5\' 2"  (1.575 m)   Wt 126 lb 9.6 oz (57.4 kg)   SpO2 97%   BMI 23.16 kg/m  Wt Readings from Last 3 Encounters:  11/01/22 126 lb 9.6 oz (57.4 kg)  10/26/22 124 lb (56.2 kg)  10/21/22 124 lb (56.2 kg)     Health Maintenance  Topic Date Due   Zoster Vaccines- Shingrix (2 of 2) 09/03/2021   COVID-19 Vaccine (8 - 2023-24 season) 02/22/2022   INFLUENZA VACCINE  01/23/2023   MAMMOGRAM  02/05/2023   Medicare Annual Wellness (AWV)  05/11/2023   DTaP/Tdap/Td (2 - Td or Tdap) 02/24/2029   Pneumonia Vaccine 64+ Years old  Completed   DEXA SCAN  Completed    Hepatitis C Screening  Completed   HPV VACCINES  Aged Out   Colonoscopy  Discontinued    There are no preventive care reminders to display for this patient.  Lab Results  Component Value Date   TSH 1.80 06/12/2022   Lab Results  Component Value Date   WBC 5.1 06/05/2020   HGB 12.6 06/05/2020   HCT 38.2 06/05/2020   MCV 87.2 06/05/2020   PLT 182.0 06/05/2020   Lab Results  Component Value Date   NA 141 08/28/2022   K 3.7 08/28/2022   CO2 26 08/28/2022   GLUCOSE 76 08/28/2022   BUN 26 (H) 08/28/2022   CREATININE 0.83 08/28/2022   BILITOT 0.5 06/12/2022   ALKPHOS 53 06/12/2022   AST 21 06/12/2022   ALT 14 06/12/2022   PROT 6.5 06/12/2022   ALBUMIN 4.3 06/12/2022   CALCIUM 9.3 08/28/2022   ANIONGAP 12 04/10/2017   GFR 68.57 08/28/2022   Lab Results  Component Value Date   CHOL 203 (H) 06/12/2022   Lab Results  Component Value Date   HDL 74.60 06/12/2022   Lab Results  Component Value Date   LDLCALC 100 (H) 06/12/2022   Lab Results  Component Value Date   TRIG 143.0 06/12/2022   Lab Results  Component Value Date   CHOLHDL 3 06/12/2022   Lab Results  Component Value Date   HGBA1C 5.2 01/15/2019      Assessment & Plan:  Tendinitis Assessment & Plan: Started  on prednisone tapering, Ace bandage provided,  advised patient to wrap the foot with Ace bandage. If symptoms not improving call office back for evaluation   Other orders -     predniSONE; Take 4 tablets ( total 40 mg) by mouth for 2 days; take 3 tablets ( total 30 mg) by mouth for 2 days; take 2 tablets ( total 20 mg) by mouth for 1 day; take 1 tablet ( total 10 mg) by mouth for 1 day.  Dispense: 17 tablet; Refill: 0    Follow-up: Return if symptoms worsen or fail to improve.   Kara Dies, NP

## 2022-11-01 NOTE — Patient Instructions (Signed)
Started on prednisone tapering. Wrap it up with the bandage.  If symptoms not improving let us know.

## 2022-11-03 ENCOUNTER — Encounter: Payer: Self-pay | Admitting: Family Medicine

## 2022-11-04 ENCOUNTER — Other Ambulatory Visit: Payer: Self-pay | Admitting: Family Medicine

## 2022-11-04 DIAGNOSIS — F419 Anxiety disorder, unspecified: Secondary | ICD-10-CM

## 2022-11-05 ENCOUNTER — Other Ambulatory Visit: Payer: Self-pay | Admitting: Nurse Practitioner

## 2022-11-05 ENCOUNTER — Encounter: Payer: Self-pay | Admitting: Nurse Practitioner

## 2022-11-05 DIAGNOSIS — M79672 Pain in left foot: Secondary | ICD-10-CM

## 2022-11-06 ENCOUNTER — Ambulatory Visit
Admission: RE | Admit: 2022-11-06 | Discharge: 2022-11-06 | Disposition: A | Discharge status: Home / Self Care | Payer: Medicare Other | Source: Ambulatory Visit | Attending: Nurse Practitioner | Admitting: Nurse Practitioner

## 2022-11-06 ENCOUNTER — Ambulatory Visit
Admission: RE | Admit: 2022-11-06 | Discharge: 2022-11-06 | Disposition: A | Discharge status: Home / Self Care | Payer: Medicare Other | Attending: Nurse Practitioner | Admitting: Nurse Practitioner

## 2022-11-06 DIAGNOSIS — M79672 Pain in left foot: Secondary | ICD-10-CM | POA: Diagnosis not present

## 2022-11-06 NOTE — Telephone Encounter (Signed)
Pt called in to book an appt with provider, as per pt, she still having the pain. Pt its booked for 11/27/22

## 2022-11-08 DIAGNOSIS — M7662 Achilles tendinitis, left leg: Secondary | ICD-10-CM | POA: Diagnosis not present

## 2022-11-18 DIAGNOSIS — M779 Enthesopathy, unspecified: Secondary | ICD-10-CM | POA: Insufficient documentation

## 2022-11-18 NOTE — Assessment & Plan Note (Signed)
Started on prednisone tapering, Ace bandage provided,  advised patient to wrap the foot with Ace bandage. If symptoms not improving call office back for evaluation

## 2022-11-27 ENCOUNTER — Ambulatory Visit: Payer: Medicare Other | Admitting: Family Medicine

## 2022-12-09 ENCOUNTER — Encounter: Payer: Self-pay | Admitting: Nurse Practitioner

## 2022-12-09 ENCOUNTER — Telehealth: Payer: Self-pay | Admitting: Family Medicine

## 2022-12-09 ENCOUNTER — Other Ambulatory Visit: Payer: Self-pay | Admitting: Family Medicine

## 2022-12-09 DIAGNOSIS — F32A Depression, unspecified: Secondary | ICD-10-CM

## 2022-12-09 NOTE — Telephone Encounter (Signed)
Prescription Request  12/09/2022  LOV: 10/21/2022  What is the name of the medication or equipment? clonazePAM (KLONOPIN) 0.5 MG tablet. Patient has been out of medication for 2 days.   Have you contacted your pharmacy to request a refill? No   Which pharmacy would you like this sent to?  [facility] [id number] [place], [place] - [address] [place] [address] Phone: [phone] Fax: [phone]    Patient notified that their request is being sent to the clinical staff for review and that they should receive a response within 2 business days.   Please advise at [facility] [phone]

## 2022-12-09 NOTE — Telephone Encounter (Signed)
Sent in today by Worthy Rancher  E-Prescribing Status: Receipt confirmed by pharmacy (12/09/2022 12:04 PM EDT)

## 2022-12-17 DIAGNOSIS — M7662 Achilles tendinitis, left leg: Secondary | ICD-10-CM | POA: Diagnosis not present

## 2022-12-17 DIAGNOSIS — M25572 Pain in left ankle and joints of left foot: Secondary | ICD-10-CM | POA: Diagnosis not present

## 2022-12-19 DIAGNOSIS — M25572 Pain in left ankle and joints of left foot: Secondary | ICD-10-CM | POA: Diagnosis not present

## 2022-12-19 DIAGNOSIS — M7662 Achilles tendinitis, left leg: Secondary | ICD-10-CM | POA: Diagnosis not present

## 2022-12-23 DIAGNOSIS — H43813 Vitreous degeneration, bilateral: Secondary | ICD-10-CM | POA: Diagnosis not present

## 2022-12-23 DIAGNOSIS — M3501 Sicca syndrome with keratoconjunctivitis: Secondary | ICD-10-CM | POA: Diagnosis not present

## 2022-12-24 DIAGNOSIS — M7662 Achilles tendinitis, left leg: Secondary | ICD-10-CM | POA: Diagnosis not present

## 2022-12-24 DIAGNOSIS — M25572 Pain in left ankle and joints of left foot: Secondary | ICD-10-CM | POA: Diagnosis not present

## 2022-12-25 ENCOUNTER — Encounter: Payer: Self-pay | Admitting: Family Medicine

## 2022-12-25 ENCOUNTER — Ambulatory Visit (INDEPENDENT_AMBULATORY_CARE_PROVIDER_SITE_OTHER): Payer: Medicare Other | Admitting: Family Medicine

## 2022-12-25 VITALS — BP 114/72 | HR 79 | Temp 97.4°F | Ht 62.0 in | Wt 123.6 lb

## 2022-12-25 DIAGNOSIS — F419 Anxiety disorder, unspecified: Secondary | ICD-10-CM

## 2022-12-25 DIAGNOSIS — F32A Depression, unspecified: Secondary | ICD-10-CM | POA: Diagnosis not present

## 2022-12-25 DIAGNOSIS — M779 Enthesopathy, unspecified: Secondary | ICD-10-CM | POA: Diagnosis not present

## 2022-12-25 DIAGNOSIS — E039 Hypothyroidism, unspecified: Secondary | ICD-10-CM | POA: Diagnosis not present

## 2022-12-25 DIAGNOSIS — M79632 Pain in left forearm: Secondary | ICD-10-CM | POA: Diagnosis not present

## 2022-12-25 DIAGNOSIS — E7800 Pure hypercholesterolemia, unspecified: Secondary | ICD-10-CM

## 2022-12-25 MED ORDER — SIMVASTATIN 20 MG PO TABS: 20.0000 mg | ORAL_TABLET | Freq: Every evening | ORAL | 1 refills | Status: DC

## 2022-12-25 MED ORDER — BUSPIRONE HCL 7.5 MG PO TABS
7.5000 mg | ORAL_TABLET | Freq: Two times a day (BID) | ORAL | 2 refills | Status: DC
Start: 2022-12-25 — End: 2023-09-24

## 2022-12-25 MED ORDER — LEVOTHYROXINE SODIUM 125 MCG PO TABS: ORAL_TABLET | ORAL | 1 refills | Status: DC

## 2022-12-25 NOTE — Assessment & Plan Note (Signed)
Possibly related to tendon or muscle strain.  Discussed use of meloxicam that she has on hand at home.  If she has to use this for more than a week she will let us know.  She was advised to take this with food.  If not improving she will let us know.

## 2022-12-25 NOTE — Progress Notes (Signed)
Marikay Alar, MD Phone: [phone]  Pam Salazar is a 77 y.o. female who presents today for f/u.  Anxiety: Patient notes this is significantly improved.  She is doing well on the buspirone and Zoloft.  She does still take the clonazepam.  She notes she ran out of clonazepam and had to take hydroxyzine while she was out of that.  No depression or SI.  Left wrist pain: Patient notes left dorsal wrist discomfort for the last couple of days.  Notes some swelling and pain.  No known injury.  Has not been taking any medications for this.  She does report she has meloxicam on hand at home from her recent Achilles tendinitis issue.  Achilles tendinitis: Patient notes she has been doing physical therapy and that has been quite beneficial.  She notes she is 70% better.  She is seeing a specialist for this.  Social History   Tobacco Use  Smoking Status Former   Types: Cigarettes  Smokeless Tobacco Former   Quit date: 07/26/2013  Tobacco Comments        Current Outpatient Medications on File Prior to Visit  Medication Sig Dispense Refill   clonazePAM (KLONOPIN) 0.5 MG tablet TAKE 2 TABLETS BY MOUTH TWICE A DAY AS NEEDED FOR ANXIETY 120 tablet 0   Multiple Vitamins-Minerals (PRESERVISION AREDS 2 PO) Take 1 capsule by mouth 2 (two) times daily.      sertraline (ZOLOFT) 100 MG tablet TAKE 2 TABLETS BY MOUTH DAILY 180 tablet 1   No current facility-administered medications on file prior to visit.     ROS see history of present illness  Objective  Physical Exam Vitals:   12/25/22 1159  BP: 114/72  Pulse: 79  Temp: (!) 97.4 F (36.3 C)  SpO2: 98%    BP Readings from Last 3 Encounters:  12/25/22 114/72  11/01/22 (!) 86/62  10/26/22 (!) 94/58   Wt Readings from Last 3 Encounters:  12/25/22 123 lb 9.6 oz (56.1 kg)  11/01/22 126 lb 9.6 oz (57.4 kg)  10/26/22 124 lb (56.2 kg)    Physical Exam Constitutional:      General: She is not in acute distress.    Appearance:  She is not diaphoretic.  Pulmonary:     Effort: Pulmonary effort is normal.  Musculoskeletal:     Comments: Left dorsal distal forearm with some mild swelling and tenderness, no anatomic snuffbox tenderness, no dorsal wrist tenderness  Neurological:     Mental Status: She is alert.      Assessment/Plan: Please see individual problem list.  Tendinitis Assessment & Plan: Improving.  She will monitor and continue physical therapy.   Anxiety and depression Assessment & Plan: Chronic issue.  Much improved.  She will continue Zoloft 100 mg daily, clonazepam 1 mg twice daily, and buspirone 7.5 mg twice daily.  Orders: -     busPIRone HCl; Take 1 tablet (7.5 mg total) by mouth 2 (two) times daily.  Dispense: 180 tablet; Refill: 2  Hypothyroidism, unspecified type -     Levothyroxine Sodium; TAKE 1 TABLET BY MOUTH DAILY BEFORE BREAKFAST  Dispense: 90 tablet; Refill: 1  Hypercholesterolemia -     Simvastatin; Take 1 tablet (20 mg total) by mouth every evening.  Dispense: 90 tablet; Refill: 1  Left forearm pain Assessment & Plan: Possibly related to tendon or muscle strain.  Discussed use of meloxicam that she has on hand at home.  If she has to use this for more than a week she  will let us know.  She was advised to take this with food.  If not improving she will let us know.    Return in about 3 months (around 03/27/2023).   Marikay Alar, MD [facility]

## 2022-12-25 NOTE — Assessment & Plan Note (Signed)
Improving.  She will monitor and continue physical therapy.

## 2022-12-25 NOTE — Assessment & Plan Note (Signed)
Chronic issue.  Much improved.  She will continue Zoloft 100 mg daily, clonazepam 1 mg twice daily, and buspirone 7.5 mg twice daily.

## 2022-12-27 ENCOUNTER — Telehealth: Payer: Self-pay | Admitting: Gastroenterology

## 2022-12-27 DIAGNOSIS — M25572 Pain in left ankle and joints of left foot: Secondary | ICD-10-CM | POA: Diagnosis not present

## 2022-12-27 DIAGNOSIS — M7662 Achilles tendinitis, left leg: Secondary | ICD-10-CM | POA: Diagnosis not present

## 2022-12-27 NOTE — Telephone Encounter (Signed)
Patient called and left a voicemail stated that she is feeling better and wanted to cancel her appointment. I called her back to confirm that her appointment has been cancel and if the problem start back please give [place] a call.

## 2022-12-30 DIAGNOSIS — M25572 Pain in left ankle and joints of left foot: Secondary | ICD-10-CM | POA: Diagnosis not present

## 2022-12-30 DIAGNOSIS — M7662 Achilles tendinitis, left leg: Secondary | ICD-10-CM | POA: Diagnosis not present

## 2023-01-02 ENCOUNTER — Ambulatory Visit: Payer: Medicare Other | Admitting: Gastroenterology

## 2023-01-06 ENCOUNTER — Other Ambulatory Visit: Payer: Self-pay | Admitting: Family Medicine

## 2023-01-06 DIAGNOSIS — F32A Anxiety disorder, unspecified: Secondary | ICD-10-CM

## 2023-01-06 DIAGNOSIS — M7662 Achilles tendinitis, left leg: Secondary | ICD-10-CM | POA: Diagnosis not present

## 2023-01-06 DIAGNOSIS — M25572 Pain in left ankle and joints of left foot: Secondary | ICD-10-CM | POA: Diagnosis not present

## 2023-01-06 MED ORDER — CLONAZEPAM 0.5 MG PO TABS
0.5000 mg | ORAL_TABLET | Freq: Two times a day (BID) | ORAL | 0 refills | Status: DC | PRN
Start: 2023-01-06 — End: 2023-02-07

## 2023-01-06 NOTE — Telephone Encounter (Signed)
 Sent to pharmacy 

## 2023-01-06 NOTE — Telephone Encounter (Signed)
Prescription Request  01/06/2023  LOV: 12/25/2022  What is the name of the medication or equipment? clonazePAM   Have you contacted your pharmacy to request a refill? Yes   Which pharmacy would you like this sent to?  [id number] [address], [place] - [address] [place] [address] Phone: [phone] Fax: [phone]    Patient notified that their request is being sent to the clinical staff for review and that they should receive a response within 2 business days.   Please advise at Mobile [phone] (mobile)

## 2023-01-06 NOTE — Telephone Encounter (Signed)
Requesting: Clonazepam 0.5mg  Contract: No UDS: None Last Visit: 12/25/2022 Next Visit: 03/28/2023 Last Refill: 12/09/22 #120/0  Prescription pended.

## 2023-01-07 NOTE — Telephone Encounter (Signed)
 Patient is aware 

## 2023-01-23 ENCOUNTER — Ambulatory Visit: Payer: Medicare Other | Admitting: Nurse Practitioner

## 2023-01-30 ENCOUNTER — Encounter: Payer: Self-pay | Admitting: Nurse Practitioner

## 2023-01-30 ENCOUNTER — Ambulatory Visit: Payer: Medicare Other | Admitting: Nurse Practitioner

## 2023-01-30 VITALS — BP 128/76 | HR 77 | Temp 97.8°F | Ht 62.0 in | Wt 123.0 lb

## 2023-01-30 DIAGNOSIS — R058 Other specified cough: Secondary | ICD-10-CM | POA: Diagnosis not present

## 2023-01-30 DIAGNOSIS — Z87891 Personal history of nicotine dependence: Secondary | ICD-10-CM

## 2023-01-30 NOTE — Progress Notes (Signed)
Established Patient Office Visit  Subjective:  Patient ID: Pam Salazar, female    DOB: 1946/04/20  Age: 77 y.o. MRN: [id number]  CC:  Chief Complaint  Patient presents with   Cough    Last 2 mnths coughing up green mucus     HPI  Pam Salazar presents for cough. She has cough on and off going on from 2 months. She states having coughing spell and she cough out thick green mucous.  She has tried mucinex  without any much relief. Have some runny nose.  Denise SOB, Chest pain, fever.  She had a chest CT last year due to ongoing cough. No lung nodules were noticed on the scan a year ago. 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  She is a former smoker and quit smoking in 2015 she used to smoke half a pack daily.  She is requesting lung cancer screening.  HPI   Past Medical History:  Diagnosis Date   Arthritis    hips, lower back   Basal cell carcinoma    Breast lump 10/21/2011   Overview:  Negative diagnostic mammogram 09/2011    Depression    Fracture of rib of right side 08/17/2013   Hypercholesterolemia    Hypothyroidism    Low blood pressure 10/25/2014   Panic disorder    previous agoraphobia   Seizures (HCC)    febrile - as infant   Shoulder pain, left    bursa   Squamous cell carcinoma of skin    Squamous cell carcinoma of skin 08/07/2022   L lateral lower leg, [facility] sched 09/04/22 at 11:45 AM   Vertigo    positional - rare   Vitamin D deficiency     Past Surgical History:  Procedure Laterality Date   ABDOMINAL HYSTERECTOMY  1981   partial - secondary to bleeding, ovaries not removed -  vaginal   CATARACT EXTRACTION W/ INTRAOCULAR LENS  IMPLANT, BILATERAL     COLONOSCOPY WITH PROPOFOL N/A 05/05/2015   Procedure: COLONOSCOPY WITH PROPOFOL;  Surgeon: Midge Minium, MD;  Location: [facility];  Service: Endoscopy;  Laterality: N/A;   COLONOSCOPY WITH PROPOFOL N/A 06/26/2021   Procedure: COLONOSCOPY WITH PROPOFOL;   Surgeon: Wyline Mood, MD;  Location: [facility];  Service: Gastroenterology;  Laterality: N/A;   MASS EXCISION Right 12/24/2017   Procedure: EXCISION MASS ( SHIN );  Surgeon: Renford Dills, MD;  Location: [facility];  Service: Vascular;  Laterality: Right;   ORIF FIBULA FRACTURE Right 2003   right lower extremitiy   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   shoulder Left    bone suprs   SHOULDER SURGERY  12/2015   TONSILLECTOMY  1974    Family History  Problem Relation Age of Onset   Arthritis Mother    Hyperlipidemia Mother    Arthritis Father    Prostate cancer Father    Breast cancer Sister 25   Heart disease Brother        heart attack   Diabetes Brother    Breast cancer Maternal Aunt 53   Colon cancer Maternal Uncle     Social History   Socioeconomic History   Marital status: Married    Spouse name: Not on file   Number of children: Not on file   Years of education: Not on file   Highest education level: Bachelor's degree (e.g., BA, AB, BS)  Occupational History   Not on file  Tobacco Use   Smoking  status: Former    Types: Cigarettes   Smokeless tobacco: Former    Quit date: 07/26/2013   Tobacco comments:       Vaping Use   Vaping status: Never Used  Substance and Sexual Activity   Alcohol use: No    Alcohol/week: 0.0 standard drinks of alcohol   Drug use: No   Sexual activity: Yes    Birth control/protection: Post-menopausal  Other Topics Concern   Not on file  Social History Narrative   Former Engineer, civil (consulting)   Social Determinants of Health   Financial Resource Strain: Medium Risk (09/23/2022)   Overall Financial Resource Strain (CARDIA)    Difficulty of Paying Living Expenses: Somewhat hard  Food Insecurity: No Food Insecurity (09/23/2022)   Hunger Vital Sign    Worried About Running Out of Food in the Last Year: Never true    Ran Out of Food in the Last Year: Never true  Transportation Needs: No Transportation Needs (09/23/2022)   PRAPARE -  Administrator, Civil Service (Medical): No    Lack of Transportation (Non-Medical): No  Physical Activity: Sufficiently Active (09/23/2022)   Exercise Vital Sign    Days of Exercise per Week: 5 days    Minutes of Exercise per Session: 30 min  Stress: No Stress Concern Present (09/23/2022)   [employer] of Occupational Health - Occupational Stress Questionnaire    Feeling of Stress : Only a little  Social Connections: Socially Integrated (09/23/2022)   Social Connection and Isolation Panel [NHANES]    Frequency of Communication with Friends and Family: More than three times a week    Frequency of Social Gatherings with Friends and Family: Once a week    Attends Religious Services: More than 4 times per year    Active Member of [employer] or Organizations: Yes    Attends Engineer, structural: More than 4 times per year    Marital Status: Married  Catering manager Violence: Not At Risk (05/10/2022)   Humiliation, Afraid, Rape, and Kick questionnaire    Fear of Current or Ex-Partner: No    Emotionally Abused: No    Physically Abused: No    Sexually Abused: No     Outpatient Medications Prior to Visit  Medication Sig Dispense Refill   busPIRone (BUSPAR) 7.5 MG tablet Take 1 tablet (7.5 mg total) by mouth 2 (two) times daily. 180 tablet 2   clonazePAM (KLONOPIN) 0.5 MG tablet Take 1 tablet (0.5 mg total) by mouth 2 (two) times daily as needed for anxiety. TAKE 2 TABLETS BY MOUTH TWICE A DAY AS NEEDED FOR ANXIETY 120 tablet 0   levothyroxine (SYNTHROID) 125 MCG tablet TAKE 1 TABLET BY MOUTH DAILY BEFORE BREAKFAST 90 tablet 1   Multiple Vitamins-Minerals (PRESERVISION AREDS 2 PO) Take 1 capsule by mouth 2 (two) times daily.      sertraline (ZOLOFT) 100 MG tablet TAKE 2 TABLETS BY MOUTH DAILY 180 tablet 1   simvastatin (ZOCOR) 20 MG tablet Take 1 tablet (20 mg total) by mouth every evening. 90 tablet 1   No facility-administered medications prior to visit.    Allergies   Allergen Reactions   Duloxetine Hcl Other (See Comments)    depression worsened   Ezetimibe-Simvastatin Other (See Comments)    Joint pain    ROS Review of Systems Negative unless indicated in HPI.    Objective:    Physical Exam Constitutional:      Appearance: Normal appearance.  HENT:     Right  Ear: Tympanic membrane normal. Tympanic membrane is not erythematous.     Left Ear: Tympanic membrane normal. Tympanic membrane is not erythematous.     Nose:     Right Turbinates: Not enlarged.     Left Turbinates: Not enlarged.     Right Sinus: No maxillary sinus tenderness or frontal sinus tenderness.     Left Sinus: No maxillary sinus tenderness or frontal sinus tenderness.     Mouth/Throat:     Mouth: Mucous membranes are moist.     Pharynx: No pharyngeal swelling, oropharyngeal exudate or posterior oropharyngeal erythema.     Tonsils: No tonsillar exudate.  Cardiovascular:     Rate and Rhythm: Normal rate and regular rhythm.  Pulmonary:     Effort: Pulmonary effort is normal.     Breath sounds: Normal breath sounds. No stridor. No wheezing.  Neurological:     General: No focal deficit present.     Mental Status: She is alert and oriented to person, place, and time. Mental status is at baseline.  Psychiatric:        Mood and Affect: Mood normal.        Behavior: Behavior normal.        Thought Content: Thought content normal.        Judgment: Judgment normal.     BP 128/76   Pulse 77   Temp 97.8 F (36.6 C) (Oral)   Ht 5\' 2"  (1.575 m)   Wt 123 lb (55.8 kg)   SpO2 96%   BMI 22.50 kg/m  Wt Readings from Last 3 Encounters:  01/30/23 123 lb (55.8 kg)  12/25/22 123 lb 9.6 oz (56.1 kg)  11/01/22 126 lb 9.6 oz (57.4 kg)     Health Maintenance  Topic Date Due   Zoster Vaccines- Shingrix (2 of 2) 09/03/2021   COVID-19 Vaccine (8 - 2023-24 season) 02/22/2022   INFLUENZA VACCINE  09/22/2023 (Originally 01/23/2023)   MAMMOGRAM  02/05/2023   Medicare Annual Wellness  (AWV)  05/11/2023   DTaP/Tdap/Td (2 - Td or Tdap) 02/24/2029   Pneumonia Vaccine 41+ Years old  Completed   DEXA SCAN  Completed   Hepatitis C Screening  Completed   HPV VACCINES  Aged Out   Colonoscopy  Discontinued    There are no preventive care reminders to display for this patient.  Lab Results  Component Value Date   TSH 1.80 06/12/2022   Lab Results  Component Value Date   WBC 5.1 06/05/2020   HGB 12.6 06/05/2020   HCT 38.2 06/05/2020   MCV 87.2 06/05/2020   PLT 182.0 06/05/2020   Lab Results  Component Value Date   NA 141 08/28/2022   K 3.7 08/28/2022   CO2 26 08/28/2022   GLUCOSE 76 08/28/2022   BUN 26 (H) 08/28/2022   CREATININE 0.83 08/28/2022   BILITOT 0.5 06/12/2022   ALKPHOS 53 06/12/2022   AST 21 06/12/2022   ALT 14 06/12/2022   PROT 6.5 06/12/2022   ALBUMIN 4.3 06/12/2022   CALCIUM 9.3 08/28/2022   ANIONGAP 12 04/10/2017   GFR 68.57 08/28/2022   Lab Results  Component Value Date   CHOL 203 (H) 06/12/2022   Lab Results  Component Value Date   HDL 74.60 06/12/2022   Lab Results  Component Value Date   LDLCALC 100 (H) 06/12/2022   Lab Results  Component Value Date   TRIG 143.0 06/12/2022   Lab Results  Component Value Date   CHOLHDL 3 06/12/2022   Lab  Results  Component Value Date   HGBA1C 5.2 01/15/2019      Assessment & Plan:  Other cough Assessment & Plan: Patient has cough off and on from last 2 months.  She politely declined antitussive medication at present. Advised patient to use Flonase nasal spray and OTC antihistamine for symptomatic treatment. Screening for lung cancer ordered. Advised patient to let us know if the symptoms are not improving.   Orders: -     Ambulatory Referral for Lung Cancer Scre  History of cigarette smoking Assessment & Plan: Lung cancer screening ordered.  Orders: -     Ambulatory Referral for Lung Cancer Scre    Follow-up: Return if symptoms worsen or fail to improve.   Kara Dies, NP

## 2023-01-31 ENCOUNTER — Encounter: Payer: Self-pay | Admitting: Nurse Practitioner

## 2023-01-31 DIAGNOSIS — Z87891 Personal history of nicotine dependence: Secondary | ICD-10-CM | POA: Insufficient documentation

## 2023-01-31 NOTE — Assessment & Plan Note (Addendum)
Patient has cough off and on from last 2 months.  She politely declined antitussive medication at present. Advised patient to use Flonase nasal spray and OTC antihistamine for symptomatic treatment. Screening for lung cancer ordered. Advised patient to let us know if the symptoms are not improving.

## 2023-01-31 NOTE — Assessment & Plan Note (Signed)
Lung cancer screening ordered.

## 2023-02-04 ENCOUNTER — Telehealth: Payer: Self-pay | Admitting: Family Medicine

## 2023-02-04 ENCOUNTER — Other Ambulatory Visit: Payer: Self-pay | Admitting: *Deleted

## 2023-02-04 DIAGNOSIS — Z122 Encounter for screening for malignant neoplasm of respiratory organs: Secondary | ICD-10-CM

## 2023-02-04 DIAGNOSIS — Z87891 Personal history of nicotine dependence: Secondary | ICD-10-CM

## 2023-02-04 NOTE — Telephone Encounter (Signed)
Noted! Thank you

## 2023-02-04 NOTE — Telephone Encounter (Signed)
Pt called stating no one called her regarding the CT scan appointment

## 2023-02-04 NOTE — Telephone Encounter (Signed)
Patient just called to see if she can get her CT scan appointment. She gave me a number if you would like to fax it to Radiology. The number is [phone]

## 2023-02-04 NOTE — Telephone Encounter (Signed)
Informed pt it was a referral placed number was given to pt [phone] to call for further information

## 2023-02-04 NOTE — Telephone Encounter (Signed)
I called pt back to let her know that the order was not placed and she stated that Pam Salazar told the pt she was going to put the order in

## 2023-02-07 ENCOUNTER — Other Ambulatory Visit: Payer: Self-pay | Admitting: Family Medicine

## 2023-02-07 ENCOUNTER — Telehealth: Payer: Self-pay | Admitting: Family Medicine

## 2023-02-07 DIAGNOSIS — F32A Depression, unspecified: Secondary | ICD-10-CM

## 2023-02-07 NOTE — Telephone Encounter (Signed)
Prescription Request  02/07/2023  LOV: 12/25/2022  What is the name of the medication or equipment?  clonazePAM (KLONOPIN) 0.5 MG tablet. Patient states she is out of medication.   Have you contacted your pharmacy to request a refill? No   Which pharmacy would you like this sent to?  [id number] [address], [place] - [address] [place] [address] Phone: [phone] Fax: [phone]    Patient notified that their request is being sent to the clinical staff for review and that they should receive a response within 2 business days.   Please advise at [facility] [phone]

## 2023-02-07 NOTE — Telephone Encounter (Signed)
Refill request was forwarded to provider.

## 2023-02-11 ENCOUNTER — Ambulatory Visit: Payer: Medicare Other

## 2023-02-11 ENCOUNTER — Encounter: Payer: Self-pay | Admitting: Nurse Practitioner

## 2023-02-11 ENCOUNTER — Ambulatory Visit (INDEPENDENT_AMBULATORY_CARE_PROVIDER_SITE_OTHER): Payer: Medicare Other | Admitting: Nurse Practitioner

## 2023-02-11 DIAGNOSIS — Z87891 Personal history of nicotine dependence: Secondary | ICD-10-CM

## 2023-02-11 NOTE — Progress Notes (Signed)
Virtual Visit via Telephone Note  I connected with [name] on 02/11/23 at  3:30 PM EDT by telephone and verified that I am speaking with the correct person using two identifiers.  Location: Patient: Pam Salazar Service Provider: Posey Boyer   Shared Decision Making Visit Lung Cancer Screening Program [id number])   Eligibility: Age 77 y.o. Pack Years Smoking History Calculation 23 (# packs/per year x # years smoked) Recent History of coughing up blood  no Unexplained weight loss? no ( >Than 15 pounds within the last 6 months ) Prior History Lung / other cancer no (Diagnosis within the last 5 years already requiring surveillance chest CT Scans). Smoking Status Former Smoker Former Smokers: Years since quit: 10 years  Quit Date: 2014  Visit Components: Discussion included one or more decision making aids. yes Discussion included risk/benefits of screening. yes Discussion included potential follow up diagnostic testing for abnormal scans. yes Discussion included meaning and risk of over diagnosis. yes Discussion included meaning and risk of False Positives. yes Discussion included meaning of total radiation exposure. yes  Counseling Included: Importance of adherence to annual lung cancer LDCT screening. yes Impact of comorbidities on ability to participate in the program. yes Ability and willingness to under diagnostic treatment. yes  Smoking Cessation Counseling: Current Smokers:  Discussed importance of smoking cessation. N/a Information about tobacco cessation classes and interventions provided to patient. N/a Patient provided with "ticket" for LDCT Scan. N/a Symptomatic Patient. no  Counseling: N/a Diagnosis Code: Tobacco Use Z72.0 Asymptomatic Patient yes  Counseling n/a  Former Smokers:  Discussed the importance of maintaining cigarette abstinence. yes Diagnosis Code: Personal History of Nicotine Dependence. U04.540 Information about tobacco cessation classes and  interventions provided to patient. Yes Patient provided with "ticket" for LDCT Scan. N/a Written Order for Lung Cancer Screening with LDCT placed in Epic.  (CT Chest Lung Cancer Screening Low Dose W/O CM) [id number] Z12.2-Screening of respiratory organs Z87.891-Personal history of nicotine dependence   Posey Boyer, NP

## 2023-02-12 ENCOUNTER — Ambulatory Visit: Admission: RE | Admit: 2023-02-12 | Payer: Medicare Other | Source: Ambulatory Visit

## 2023-02-19 ENCOUNTER — Other Ambulatory Visit: Payer: Self-pay | Admitting: Family Medicine

## 2023-02-19 DIAGNOSIS — M898X1 Other specified disorders of bone, shoulder: Secondary | ICD-10-CM | POA: Diagnosis not present

## 2023-02-19 DIAGNOSIS — M19011 Primary osteoarthritis, right shoulder: Secondary | ICD-10-CM | POA: Diagnosis not present

## 2023-02-19 DIAGNOSIS — M1611 Unilateral primary osteoarthritis, right hip: Secondary | ICD-10-CM | POA: Diagnosis not present

## 2023-02-19 DIAGNOSIS — M19012 Primary osteoarthritis, left shoulder: Secondary | ICD-10-CM | POA: Diagnosis not present

## 2023-02-19 DIAGNOSIS — Z1231 Encounter for screening mammogram for malignant neoplasm of breast: Secondary | ICD-10-CM

## 2023-02-21 ENCOUNTER — Ambulatory Visit
Admission: RE | Admit: 2023-02-21 | Discharge: 2023-02-21 | Disposition: A | Discharge status: Home / Self Care | Payer: Medicare Other | Source: Ambulatory Visit | Attending: Acute Care | Admitting: Acute Care

## 2023-02-21 DIAGNOSIS — Z87891 Personal history of nicotine dependence: Secondary | ICD-10-CM | POA: Diagnosis not present

## 2023-02-21 DIAGNOSIS — Z122 Encounter for screening for malignant neoplasm of respiratory organs: Secondary | ICD-10-CM | POA: Diagnosis not present

## 2023-02-26 ENCOUNTER — Telehealth: Payer: Self-pay | Admitting: Family Medicine

## 2023-02-26 DIAGNOSIS — M1611 Unilateral primary osteoarthritis, right hip: Secondary | ICD-10-CM | POA: Diagnosis not present

## 2023-02-26 NOTE — Telephone Encounter (Signed)
Patient just called and needs a fax to be signed. For her to get her surgery. The fax should be coming through.

## 2023-02-27 NOTE — Telephone Encounter (Signed)
Have not received any documentation via fax as of 02/27/23 @ [phone]

## 2023-02-28 NOTE — Telephone Encounter (Signed)
Spoke to pt. Informed pt we haven't received anything yet. Pt stated she wasn't surprised. Informed pt if there is anything we can do to help speed the process up to let us know. Pt stated she was going to call her hip surgeon again and see.

## 2023-03-03 NOTE — Telephone Encounter (Signed)
noted 

## 2023-03-03 NOTE — Telephone Encounter (Signed)
Spoke to pt, informed pt that we didn't receiev the paperwork also informed I called the surgeon scheduler and left a vm with her to give our office a call. Pt stated she asked scheduler to send paperwork to her email so she could print it off her self and bring by the office hopefully tomorrow. Informed pt I would continue to look for clearance form as well.  Sending to pcp and Evelene Croon

## 2023-03-03 NOTE — Telephone Encounter (Signed)
Patient states she is following up to find out if we have received the Surgical Clearance from Dr. Dorthula Nettles with [facility].  Patient states they are sending this to Kara Dies, NP, however, Dr. Marikay Alar should be able to sign this as well since he is her PCP.

## 2023-03-03 NOTE — Telephone Encounter (Signed)
Please give the paperwork to me for this once we receive it.

## 2023-03-03 NOTE — Telephone Encounter (Signed)
Lvm with scheduling coordinator.

## 2023-03-03 NOTE — Telephone Encounter (Signed)
Paper work given to [employer] for review

## 2023-03-04 ENCOUNTER — Telehealth: Payer: Self-pay | Admitting: Family Medicine

## 2023-03-04 ENCOUNTER — Other Ambulatory Visit: Payer: Self-pay | Admitting: Family Medicine

## 2023-03-04 DIAGNOSIS — F32A Depression, unspecified: Secondary | ICD-10-CM

## 2023-03-04 NOTE — Telephone Encounter (Signed)
Please confirm that she advised there was no change to her medical history since her last visit with me. I have signed her paperwork and this can be given to her once you confirm the above information. Per the request the surgeon should be obtaining labs on her prior to the surgery. Her chest CT results have not returned yet.

## 2023-03-04 NOTE — Telephone Encounter (Signed)
Patient confirmed once again are no changes to her medical history.

## 2023-03-04 NOTE — Telephone Encounter (Signed)
Received surgical clearance for this patient.  Please contact her and see if she has been having any chest pain or shortness of breath.  Please see if she is able to walk up 2 flights of stairs without chest pain or shortness of breath.  Please see if her medical history is changed at all since her last visit with me.  Please see if the surgeons office is obtaining lab work on her.  Once I have all this information I will know if we need to order any labs for her and if I am able to sign off on surgical clearance without an office visit.

## 2023-03-04 NOTE — Telephone Encounter (Signed)
Left message to call the office back regarding the surgical clearance questions below.

## 2023-03-04 NOTE — Telephone Encounter (Signed)
The Patient confirms no chest pain or SOB. Patient can not walk up 2 flights of stairs due to her hip pain but denies any chest pain or SOB with this activity. Patient had a Chest CT about 2 weeks ago but has not received the results yet. No lab work done or ordered since the last labs she had here.

## 2023-03-05 ENCOUNTER — Ambulatory Visit
Admission: RE | Admit: 2023-03-05 | Discharge: 2023-03-05 | Disposition: A | Discharge status: Home / Self Care | Payer: Medicare Other | Source: Ambulatory Visit | Attending: Family Medicine | Admitting: Family Medicine

## 2023-03-05 DIAGNOSIS — Z1231 Encounter for screening mammogram for malignant neoplasm of breast: Secondary | ICD-10-CM | POA: Diagnosis not present

## 2023-03-07 ENCOUNTER — Telehealth: Payer: Self-pay

## 2023-03-07 NOTE — Telephone Encounter (Addendum)
Patient orthopaedic does not require notes if patient wishes to have notes please give them but not needed. Form and notes at front desk as patient wishes with Pam Salazar.

## 2023-03-07 NOTE — Telephone Encounter (Signed)
Patient states she would like for Prince Solian, CMA, to leave patient's surgical clearance and notes for the last several office visits up front for her to pick up today.  Patient states Dr. Dossie Arbour office has not received this information, even though, she was told it had been faxed.  Patient states she is concerned that she will never have her hip replaced if she doesn't come get the paperwork and take it to Dr. Garlon Hatchet office in [place].  I spoke with Lanora Manis and she has not been able to locate the paperwork.  Lanora Manis states she is going to check with Benedict Needy, RN, as she was also helping with this.  I spoke with Henrene Pastor, RN, and she states she is going to look into this and please let the patient know that if she will give her some time, she will have this resolved today. I relayed message to patient.  Patient states she would like to know how much time Olegario Messier will need.  I let her know that I'm not sure.  Patient states she would like for me to let Olegario Messier know that she and her husband will be in our waiting room within the next hour to pick up the paperwork.  Patient states this has really gotten her down and she has started looking for a new practice.  Patient states she is in a lot of pain and she could have already had a date for her surgery by now.  Patient states she knows that she will have to stay with [place] to keep her medications until she can find another provider.  Patient states she would be interested in seeing Kara Dies, NP, as her provider.

## 2023-03-07 NOTE — Telephone Encounter (Signed)
Surgical clearance found and gave to Pam Salazar to fax and give to the Patient.

## 2023-03-07 NOTE — Telephone Encounter (Signed)
Patient came into the office and picked up a copys of surgical clearence form and office notes.

## 2023-03-10 DIAGNOSIS — M1611 Unilateral primary osteoarthritis, right hip: Secondary | ICD-10-CM | POA: Diagnosis not present

## 2023-03-10 DIAGNOSIS — M25551 Pain in right hip: Secondary | ICD-10-CM | POA: Diagnosis not present

## 2023-03-11 ENCOUNTER — Telehealth: Payer: Self-pay | Admitting: Acute Care

## 2023-03-11 DIAGNOSIS — Z87891 Personal history of nicotine dependence: Secondary | ICD-10-CM

## 2023-03-11 DIAGNOSIS — Z122 Encounter for screening for malignant neoplasm of respiratory organs: Secondary | ICD-10-CM

## 2023-03-11 NOTE — Telephone Encounter (Signed)
Pt calling in with  question in regards to her LCS CT

## 2023-03-12 NOTE — Telephone Encounter (Signed)
Called and spoke to pt. Patient states she saw her results on MyChart and had questions regarding potential ILD results. Discussed this with patient. Offered patient an appt with Dr. Ramaswamy/Dr. Isaiah Serge for ILD eval. She is interested. Consults appt scheduled with MR on 10/31 (first available) and will mail ILD packet to patient. Pt address verified and she is aware to complete and bring packet with her to appt.

## 2023-03-14 NOTE — Addendum Note (Signed)
Addended by: Sheran Luz on: 03/14/2023 12:38 PM   Modules accepted: Orders

## 2023-03-17 ENCOUNTER — Ambulatory Visit (INDEPENDENT_AMBULATORY_CARE_PROVIDER_SITE_OTHER): Payer: Medicare Other | Admitting: Dermatology

## 2023-03-17 VITALS — BP 96/67 | HR 85

## 2023-03-17 DIAGNOSIS — W908XXA Exposure to other nonionizing radiation, initial encounter: Secondary | ICD-10-CM | POA: Diagnosis not present

## 2023-03-17 DIAGNOSIS — L578 Other skin changes due to chronic exposure to nonionizing radiation: Secondary | ICD-10-CM | POA: Diagnosis not present

## 2023-03-17 DIAGNOSIS — L821 Other seborrheic keratosis: Secondary | ICD-10-CM | POA: Diagnosis not present

## 2023-03-17 DIAGNOSIS — L814 Other melanin hyperpigmentation: Secondary | ICD-10-CM

## 2023-03-17 DIAGNOSIS — Z85828 Personal history of other malignant neoplasm of skin: Secondary | ICD-10-CM

## 2023-03-17 NOTE — Patient Instructions (Signed)

## 2023-03-17 NOTE — Progress Notes (Signed)
   Follow-Up Visit   Subjective  Pam Salazar is a 77 y.o. female who presents for the following: SCC recheck of the left lateral lower leg, treated with [facility] 09/04/2022.  The patient has spots, moles and lesions to be evaluated, some may be new or changing.   The following portions of the chart were reviewed this encounter and updated as appropriate: medications, allergies, medical history  Review of Systems:  No other skin or systemic complaints except as noted in HPI or Assessment and Plan.  Objective  Well appearing patient in no apparent distress; mood and affect are within normal limits.  A focused examination was performed of the following areas: Face, legs  Relevant physical exam findings are noted in the Assessment and Plan.    Assessment & Plan   ACTINIC DAMAGE - chronic, secondary to cumulative UV radiation exposure/sun exposure over time - diffuse scaly erythematous macules with underlying dyspigmentation - Recommend daily broad spectrum sunscreen SPF 30+ to sun-exposed areas, reapply every 2 hours as needed.  - Recommend staying in the shade or wearing long sleeves, sun glasses (UVA+UVB protection) and wide brim hats (4-inch brim around the entire circumference of the hat). - Call for new or changing lesions.  HISTORY OF SQUAMOUS CELL CARCINOMA OF THE SKIN - No evidence of recurrence today of the left lateral lower leg, [facility] 09/04/2022 - Recommend regular full body skin exams - Recommend daily broad spectrum sunscreen SPF 30+ to sun-exposed areas, reapply every 2 hours as needed.  - Call if any new or changing lesions are noted between office visits  LENTIGINES Exam: scattered tan macules Due to sun exposure Treatment Plan: Benign-appearing, observe. Recommend daily broad spectrum sunscreen SPF 30+ to sun-exposed areas, reapply every 2 hours as needed.  Call for any changes  SEBORRHEIC KERATOSIS - Stuck-on, waxy, tan-brown papules and/or plaques  -  Benign-appearing - Discussed benign etiology and prognosis. - Observe - Call for any changes   Return in about 6 months (around 09/14/2023) for TBSE, Hx SCC, Hx BCC.  ICherlyn Labella, CMA, am acting as scribe for Willeen Niece, MD .   Documentation: I have reviewed the above documentation for accuracy and completeness, and I agree with the above.  Willeen Niece, MD

## 2023-03-24 ENCOUNTER — Other Ambulatory Visit: Payer: Self-pay

## 2023-03-24 NOTE — Progress Notes (Signed)
Patient called to get pricing for BBL on the nose, discussed with patient $250 out of pocket charge for BBL on the nose only.

## 2023-03-25 HISTORY — PX: TOTAL HIP ARTHROPLASTY: SHX124

## 2023-03-28 ENCOUNTER — Ambulatory Visit: Payer: Medicare Other | Admitting: Family Medicine

## 2023-03-28 DIAGNOSIS — M1611 Unilateral primary osteoarthritis, right hip: Secondary | ICD-10-CM | POA: Diagnosis not present

## 2023-03-31 ENCOUNTER — Encounter: Payer: Self-pay | Admitting: Family Medicine

## 2023-03-31 ENCOUNTER — Ambulatory Visit (INDEPENDENT_AMBULATORY_CARE_PROVIDER_SITE_OTHER): Payer: Medicare Other | Admitting: Family Medicine

## 2023-03-31 VITALS — BP 124/82 | HR 95 | Temp 97.6°F | Ht 62.0 in | Wt 124.0 lb

## 2023-03-31 DIAGNOSIS — F32A Depression, unspecified: Secondary | ICD-10-CM | POA: Diagnosis not present

## 2023-03-31 DIAGNOSIS — M1611 Unilateral primary osteoarthritis, right hip: Secondary | ICD-10-CM | POA: Diagnosis not present

## 2023-03-31 DIAGNOSIS — E039 Hypothyroidism, unspecified: Secondary | ICD-10-CM | POA: Diagnosis not present

## 2023-03-31 DIAGNOSIS — F419 Anxiety disorder, unspecified: Secondary | ICD-10-CM

## 2023-03-31 DIAGNOSIS — R053 Chronic cough: Secondary | ICD-10-CM | POA: Diagnosis not present

## 2023-03-31 NOTE — Progress Notes (Signed)
Marikay Alar, MD Phone: [phone]  Pam Salazar is a 77 y.o. female who presents today for follow-up.  Anxiety/depression: Patient notes no depression.  Notes her anxiety was good until she had to come off of her melatonin leading into a hip replacement.  She notes sleep deprivation has been contributing to some anxiety.  She is on buspirone, clonazepam, and Zoloft.  No SI.  Hypothyroidism: Taking Synthroid.  No skin changes.  No heat or cold intolerance.  Patient is scheduled for right hip replacement this week.  Notes it is bone-on-bone.  She notes no chest pain or shortness of breath.  Chronic cough: Patient had CT lung cancer screening recently that revealed possible interstitial lung disease versus sequelae of prior infection.  She has an appointment with pulmonology in the near future.  Social History   Tobacco Use  Smoking Status Former   Types: Cigarettes  Smokeless Tobacco Former   Quit date: 07/26/2013  Tobacco Comments        Current Outpatient Medications on File Prior to Visit  Medication Sig Dispense Refill   busPIRone (BUSPAR) 7.5 MG tablet Take 1 tablet (7.5 mg total) by mouth 2 (two) times daily. 180 tablet 2   clonazePAM (KLONOPIN) 0.5 MG tablet TAKE 2 TABLETS BY MOUTH TWICE A DAY AS NEEDED FOR ANXIETY 120 tablet 0   levothyroxine (SYNTHROID) 125 MCG tablet TAKE 1 TABLET BY MOUTH DAILY BEFORE BREAKFAST 90 tablet 1   Multiple Vitamins-Minerals (PRESERVISION AREDS 2 PO) Take 1 capsule by mouth 2 (two) times daily.      sertraline (ZOLOFT) 100 MG tablet TAKE 2 TABLETS BY MOUTH DAILY 180 tablet 1   simvastatin (ZOCOR) 20 MG tablet Take 1 tablet (20 mg total) by mouth every evening. 90 tablet 1   No current facility-administered medications on file prior to visit.     ROS see history of present illness  Objective  Physical Exam Vitals:   03/31/23 1319  BP: 124/82  Pulse: 95  Temp: 97.6 F (36.4 C)  SpO2: 96%    BP Readings from Last 3  Encounters:  03/31/23 124/82  03/17/23 96/67  01/30/23 128/76   Wt Readings from Last 3 Encounters:  03/31/23 124 lb (56.2 kg)  02/21/23 125 lb (56.7 kg)  01/30/23 123 lb (55.8 kg)    Physical Exam Constitutional:      General: She is not in acute distress.    Appearance: She is not diaphoretic.  Cardiovascular:     Rate and Rhythm: Normal rate and regular rhythm.     Heart sounds: Normal heart sounds.  Pulmonary:     Effort: Pulmonary effort is normal.     Breath sounds: Normal breath sounds.  Skin:    General: Skin is warm and dry.  Neurological:     Mental Status: She is alert.      Assessment/Plan: Please see individual problem list.  Chronic cough Assessment & Plan: Chronic issue.  She will see pulmonology as planned.  Discussed the potential for the CT findings to be interstitial lung disease versus sequelae of prior infection.   Anxiety and depression Assessment & Plan: Chronic issue.  Much improved.  She will continue Zoloft 100 mg daily, clonazepam 1 mg twice daily, and buspirone 7.5 mg twice daily.   Primary osteoarthritis of right hip Assessment & Plan: Chronic issue.  Patient is scheduled for hip replacement this week.   Hypothyroidism, unspecified type Assessment & Plan: Chronic issue.  Continue Synthroid 125 mcg daily.  TSH  is up-to-date.    Return in about 4 months (around 08/01/2023) for transfer of care visit for anxiety/klonopin.   Marikay Alar, MD [facility]

## 2023-03-31 NOTE — Assessment & Plan Note (Signed)
Chronic issue.  She will see pulmonology as planned.  Discussed the potential for the CT findings to be interstitial lung disease versus sequelae of prior infection.

## 2023-03-31 NOTE — Assessment & Plan Note (Signed)
Chronic issue.  Much improved.  She will continue Zoloft 100 mg daily, clonazepam 1 mg twice daily, and buspirone 7.5 mg twice daily.

## 2023-03-31 NOTE — Assessment & Plan Note (Signed)
Chronic issue.  Continue Synthroid 125 mcg daily.  TSH is up-to-date.

## 2023-03-31 NOTE — Assessment & Plan Note (Signed)
Chronic issue.  Patient is scheduled for hip replacement this week.

## 2023-04-03 DIAGNOSIS — M1611 Unilateral primary osteoarthritis, right hip: Secondary | ICD-10-CM | POA: Diagnosis not present

## 2023-04-08 ENCOUNTER — Other Ambulatory Visit: Payer: Self-pay | Admitting: Family Medicine

## 2023-04-08 DIAGNOSIS — Z96642 Presence of left artificial hip joint: Secondary | ICD-10-CM | POA: Diagnosis not present

## 2023-04-08 DIAGNOSIS — F419 Anxiety disorder, unspecified: Secondary | ICD-10-CM

## 2023-04-08 DIAGNOSIS — M25551 Pain in right hip: Secondary | ICD-10-CM | POA: Diagnosis not present

## 2023-04-09 NOTE — Telephone Encounter (Signed)
Please contact the patient and see if she has enough to get through to Friday when I am back in the office. I am having issues signing controlled substances from home.

## 2023-04-10 NOTE — Telephone Encounter (Signed)
Patient called and states has one pill which will be ok for Friday morning but will be out for Friday night. She states she goes through this every month when trying to refill medication  clonazePAM (KLONOPIN) 0.5 MG tablet

## 2023-04-11 DIAGNOSIS — Z96642 Presence of left artificial hip joint: Secondary | ICD-10-CM | POA: Diagnosis not present

## 2023-04-11 DIAGNOSIS — M25551 Pain in right hip: Secondary | ICD-10-CM | POA: Diagnosis not present

## 2023-04-14 ENCOUNTER — Other Ambulatory Visit: Payer: Self-pay | Admitting: Family Medicine

## 2023-04-16 DIAGNOSIS — M1611 Unilateral primary osteoarthritis, right hip: Secondary | ICD-10-CM | POA: Diagnosis not present

## 2023-04-18 DIAGNOSIS — Z96642 Presence of left artificial hip joint: Secondary | ICD-10-CM | POA: Diagnosis not present

## 2023-04-18 DIAGNOSIS — M25551 Pain in right hip: Secondary | ICD-10-CM | POA: Diagnosis not present

## 2023-04-21 DIAGNOSIS — M25551 Pain in right hip: Secondary | ICD-10-CM | POA: Diagnosis not present

## 2023-04-21 DIAGNOSIS — Z96642 Presence of left artificial hip joint: Secondary | ICD-10-CM | POA: Diagnosis not present

## 2023-04-22 NOTE — Telephone Encounter (Signed)
Error

## 2023-04-23 DIAGNOSIS — M25551 Pain in right hip: Secondary | ICD-10-CM | POA: Diagnosis not present

## 2023-04-23 DIAGNOSIS — Z96642 Presence of left artificial hip joint: Secondary | ICD-10-CM | POA: Diagnosis not present

## 2023-04-24 ENCOUNTER — Encounter: Payer: Self-pay | Admitting: Internal Medicine

## 2023-04-24 ENCOUNTER — Ambulatory Visit: Payer: Medicare Other | Admitting: Internal Medicine

## 2023-04-24 VITALS — BP 90/60 | HR 80 | Ht 62.0 in | Wt 126.4 lb

## 2023-04-24 DIAGNOSIS — Z87891 Personal history of nicotine dependence: Secondary | ICD-10-CM | POA: Diagnosis not present

## 2023-04-24 DIAGNOSIS — R918 Other nonspecific abnormal finding of lung field: Secondary | ICD-10-CM | POA: Diagnosis not present

## 2023-04-24 NOTE — Patient Instructions (Addendum)
ICD-10-CM   1. Former smoker  Z87.891     2. Abnormal CT lung screening  R91.8      CT scan of the chest shows linear atelectasis.  I am not so sure that you have interstitial lung disease.  I would call this is interstitial lung abnormality.  Plan - Do blood work for ESR, QuantiFERON gold, ANA, double-stranded DNA, CCP, SCL-70, SSA and SSB\ -Do full pulmonary function test - best wishes for your daughter recovery from AML  Follow-up - Video visit with nurse practitioner face-to-face visit with nurse practitioner in < 8 weeks to discuss those results  -If they are essentially normal then we will just do a high-resolution CT chest in August 2025 instead of low-dose CT scan of the chest at that time

## 2023-04-24 NOTE — Progress Notes (Signed)
OV 04/24/2023  Subjective:  Patient ID: Pam Salazar, female , DOB: 16-Sep-1945 , age 77 y.o. , MRN: [id number] , ADDRESS: [address] [place] [id number] PCP Glori Luis, MD Patient Care Team: Glori Luis, MD as PCP - General (Family Medicine) Kieth Brightly, MD as Consulting Physician (General Surgery) Dale Denton, MD (Internal Medicine)  This Provider for this visit: Treatment Team:  Attending Provider: Kalman Shan, MD    04/24/2023 -   Chief Complaint  Patient presents with   Consult    Pt is here for Consult visit.  Pt needs ILD explained to her.     HPI Pam Salazar 77 y.o. -retired Engineer, civil (consulting).  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  She is a former smoker.  She says she started smoking while she was in [place] at [facility] and then creatinine 2014 or so.  Review of the lung cancer screening notes indicate that she has a 23 pack smoking history and quit in 2014.  She is here because the low-dose CT scan of the chest done in August 2024 showed some reticular opacities in the lung base and therefore she has been referred here.  She says that she was in a two-story house and she only gets dyspneic when she climbs back up for the second time.'s been stable like this for 10 years.  Some 3 weeks ago she had hip surgery and therefore she is not able to mobilize well but she does not think there is any health status change.  There is no chest pain.  She has had annual low-dose CT scans of the chest and I personally visualized this and this basically reticular opacities in the lung base consistent with linear atelectasis or postinflammatory scarring.  This extends back even in the prior ER 2023.  Interstitial lung disease questionnaire done and she brought it with her - Symptoms: Mild dyspnea when climbing stairs and mild fatigue and mild amount of depression level 3.  Her daughter has been diagnosed with leukemia and she  was a healthy 77 year old  - Past medical history relevant ILD: No autoimmune disease no acid reflux no pulmonary hypertension.  She does have hypothyroidism.  She has had COVID boosters and then had COVID disease in 2024   - Review of systems positive for fatigue and arthralgia.  Also positive for dysphagia for the last several years if she eats rice.  She also has some dry eyes.   - Family history for lung disease: Mother had COPD  Personal exposure history started smoking [age]  quit 20 1410 cigarettes/day.  She did smoke marijuana 1 time in 1980.  No cocaine use.   - Home and hobby details: [place] home in a single-family home 16 years house was built in 2008.  Extensive organic antigen history is negative except she does plant flowers and works with mulch and woodchips she does have 3 dogs inside but no birds   - Occupational history: She smoked and she grew [place].  She worked as a Engineer, civil (consulting).  Detail organic and inorganic antigen exposure history is negative    CT Chest data from date: **  - personally visualized and independently interpreted : August 2024 personally visualized - my findings are: Postinflammatory atelectasis/linear scarring  Narrative & Impression  CLINICAL DATA:  Former smoker with 23 pack-year history   EXAM: CT CHEST WITHOUT CONTRAST  LOW-DOSE FOR LUNG CANCER SCREENING   TECHNIQUE: Multidetector CT imaging of the chest was performed following the standard protocol without IV contrast.   RADIATION DOSE REDUCTION: This exam was performed according to the departmental dose-optimization program which includes automated exposure control, adjustment of the mA and/or kV according to patient size and/or use of iterative reconstruction technique.   COMPARISON:  Chest CT dated January 30, 2022   FINDINGS: Cardiovascular: Normal heart size. No pericardial effusion. Normal caliber thoracic aorta with moderate calcified plaque. No  visible coronary artery calcifications.   Mediastinum/Nodes: Esophagus and thyroid are unremarkable. No enlarged lymph nodes seen in the chest.   Lungs/Pleura: Central airways are patent. No consolidation, pleural effusion or pneumothorax. Mild lower lung subpleural predominant reticular opacities, unchanged when compared with the prior exam. Stable solid nodule of the right lower lobe measuring 5.2 mm on image 157.   Upper Abdomen: No acute abnormality.   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 No aggressive appearing osseous lesions.   IMPRESSION: 1. Lung-RADS 2S, benign appearance or behavior. Continue annual screening with low-dose chest CT without contrast in 12 months. S modifier for interstitial lung changes. 2. Mild lower lung subpleural predominant reticular opacities, could be chronic sequela of prior infection, although interstitial lung disease could also have this appearance. Correlate with patient's systems and consider pulmonary function tests and/or ILD protocol CT for further evaluation. 3. Aortic Atherosclerosis (ICD10-I70.0).     Electronically Signed   By: Allegra Lai M.D.   On: 03/04/2023 16:56     PFT      No data to display           Last blood work March 2024 normal creatinine     has a past medical history of Arthritis, Basal cell carcinoma, Breast lump (10/21/2011), Depression, Fracture of rib of right side (08/17/2013), Hypercholesterolemia, Hypothyroidism, Low blood pressure (10/25/2014), Panic disorder, Seizures (HCC), Shoulder pain, left, Squamous cell carcinoma of skin, Squamous cell carcinoma of skin (08/07/2022), Vertigo, and Vitamin D deficiency.   reports that she has quit smoking. Her smoking use included cigarettes. She quit smokeless tobacco use about 9 years ago.  Past Surgical History:  Procedure Laterality Date   ABDOMINAL HYSTERECTOMY  1981    partial - secondary to bleeding, ovaries not removed -  vaginal   CATARACT EXTRACTION W/ INTRAOCULAR LENS  IMPLANT, BILATERAL     COLONOSCOPY WITH PROPOFOL N/A 05/05/2015   Procedure: COLONOSCOPY WITH PROPOFOL;  Surgeon: Midge Minium, MD;  [facility];  Service: Endoscopy;  Laterality: N/A;   COLONOSCOPY WITH PROPOFOL N/A 06/26/2021   Procedure: COLONOSCOPY WITH PROPOFOL;  Surgeon: Wyline Mood, MD;  Location: [facility];  Service: Gastroenterology;  Laterality: N/A;   MASS EXCISION Right 12/24/2017   Procedure: EXCISION MASS ( SHIN );  Surgeon: Renford Dills, MD;  Location: [facility] ORS;  Service: Vascular;  Laterality: Right;   ORIF FIBULA FRACTURE Right 2003   right lower extremitiy   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   shoulder Left    bone suprs   SHOULDER SURGERY  12/2015   TONSILLECTOMY  1974    Allergies  Allergen Reactions   Duloxetine Hcl Other (See Comments)    depression worsened   Ezetimibe-Simvastatin Other (See Comments)    Joint pain    Immunization History  Administered Date(s) Administered   Influenza Split 06/28/2013, 05/27/2014, 04/21/2017   Influenza, High Dose Seasonal PF 05/19/2018, 02/25/2019, 03/12/2021   Influenza-Unspecified  05/25/2015, 05/24/2017, 03/13/2020, 05/29/2022   PFIZER(Purple Top)SARS-COV-2 Vaccination 08/06/2019, 08/31/2019, 03/13/2020, 11/30/2020, 03/12/2021   Pfizer Covid-19 Vaccine Bivalent Booster 61yrs & up 07/09/2021   Pneumococcal Conjugate-13 08/03/2013   Pneumococcal Polysaccharide-23 09/14/2012, 07/11/2015   Respiratory Syncytial Virus Vaccine,Recomb Aduvanted(Arexvy) 05/29/2022   Tdap 02/25/2019   Unspecified SARS-COV-2 Vaccination 03/13/2020   Zoster Recombinant(Shingrix) 07/09/2021   Zoster, Live 06/24/2008    Family History  Problem Relation Age of Onset   Arthritis Mother    Hyperlipidemia Mother    Arthritis Father    Prostate cancer Father    Breast cancer Sister 38   Heart disease Brother         heart attack   Diabetes Brother    Breast cancer Maternal Aunt 60   Colon cancer Maternal Uncle      Current Outpatient Medications:    busPIRone (BUSPAR) 7.5 MG tablet, Take 1 tablet (7.5 mg total) by mouth 2 (two) times daily., Disp: 180 tablet, Rfl: 2   clonazePAM (KLONOPIN) 0.5 MG tablet, TAKE 2 TABLETS BY MOUTH TWICE A DAY AS NEEDED FOR ANXIETY, Disp: 120 tablet, Rfl: 1   levothyroxine (SYNTHROID) 125 MCG tablet, TAKE 1 TABLET BY MOUTH DAILY BEFORE BREAKFAST, Disp: 90 tablet, Rfl: 1   Multiple Vitamins-Minerals (PRESERVISION AREDS 2 PO), Take 1 capsule by mouth 2 (two) times daily. , Disp: , Rfl:    sertraline (ZOLOFT) 100 MG tablet, TAKE 2 TABLETS BY MOUTH DAILY, Disp: 180 tablet, Rfl: 1   simvastatin (ZOCOR) 20 MG tablet, Take 1 tablet (20 mg total) by mouth every evening., Disp: 90 tablet, Rfl: 1      Objective:   Vitals:   04/24/23 1511  BP: 90/60  Pulse: 80  SpO2: 98%  Weight: 126 lb 6.4 oz (57.3 kg)  Height: 5\' 2"  (1.575 m)    Estimated body mass index is 23.12 kg/m as calculated from the following:   Height as of this encounter: 5\' 2"  (1.575 m).   Weight as of this encounter: 126 lb 6.4 oz (57.3 kg).  @WEIGHTCHANGE @  Filed Weights   04/24/23 1511  Weight: 126 lb 6.4 oz (57.3 kg)     Physical Exam   General: No distress. Looks well O2 at rest: no Cane present: no Sitting in wheel chair: no Frail: no Obese: no Neuro: Alert and Oriented x 3. GCS 15. Speech normal Psych: Pleasant Resp:  Barrel Chest - no.  Wheeze - no, Crackles -MAYBE LUNG BASE, No overt respiratory distress CVS: Normal heart sounds. Murmurs - no Ext: Stigmata of Connective Tissue Disease - no HEENT: Normal upper airway. PEERL +. No post nasal drip        Assessment:       ICD-10-CM   1. Former smoker  Z87.891     2. Abnormal CT lung screening  R91.8          Plan:     Patient Instructions     ICD-10-CM   1. Former smoker  Z87.891     2. Abnormal CT lung  screening  R91.8      CT scan of the chest shows linear atelectasis.  I am not so sure that you have interstitial lung disease.  I would call this is interstitial lung abnormality.  Plan - Do blood work for ESR, QuantiFERON gold, ANA, double-stranded DNA, CCP, SCL-70, SSA and SSB\ -Do full pulmonary function test - best wishes for your daughter recovery from AML  Follow-up - Video visit with nurse practitioner face-to-face visit with  nurse practitioner in < 8 weeks to discuss those results  -If they are essentially normal then we will just do a high-resolution CT chest in August 2025 instead of low-dose CT scan of the chest at that time    FOLLOWUP Return in about 8 weeks (around 06/19/2023) for 15 min visit, VIDEO VISIT, with any of the APPS.    SIGNATURE    Dr. Kalman Shan, M.D., F.C.C.P,  Pulmonary and Critical Care Medicine Staff Physician, [facility] Center Director - Interstitial Lung Disease  Program  Pulmonary Fibrosis [facility] [place], [place], [address]  Pager: [phone], If no answer or between  15:00h - 7:00h: call [phone] Telephone: [phone]  5:31 PM 04/24/2023

## 2023-05-02 ENCOUNTER — Telehealth: Payer: Self-pay | Admitting: Family Medicine

## 2023-05-02 NOTE — Telephone Encounter (Signed)
Copied from CRM [id number]. Topic: Medicare AWV >> May 02, 2023 10:23 AM Payton Doughty wrote: Reason for CRM: Called LVM 05/02/2023 to schedule Annual Wellness Visit  Verlee Rossetti; Care Guide Ambulatory Clinical Support [place] [facility] Direct Dial: [phone]

## 2023-05-05 ENCOUNTER — Telehealth: Payer: Self-pay | Admitting: Internal Medicine

## 2023-05-05 DIAGNOSIS — R053 Chronic cough: Secondary | ICD-10-CM

## 2023-05-05 DIAGNOSIS — R918 Other nonspecific abnormal finding of lung field: Secondary | ICD-10-CM

## 2023-05-05 NOTE — Telephone Encounter (Signed)
Pt wants to know if Dr. Marchelle Gearing can put in order for PFT and Blood work

## 2023-05-14 DIAGNOSIS — M1611 Unilateral primary osteoarthritis, right hip: Secondary | ICD-10-CM | POA: Diagnosis not present

## 2023-05-14 NOTE — Telephone Encounter (Signed)
Per LOV 04/24/23: Plan - Do blood work for ESR, QuantiFERON gold, ANA, double-stranded DNA, CCP, SCL-70, SSA and SSB\ -Do full pulmonary function test  Orders placed. Patient aware.

## 2023-05-19 ENCOUNTER — Telehealth: Payer: Self-pay | Admitting: Internal Medicine

## 2023-05-19 NOTE — Telephone Encounter (Signed)
Pt calling in to get sch for her PFT in [facility]

## 2023-06-04 ENCOUNTER — Other Ambulatory Visit: Payer: Self-pay | Admitting: Orthopaedic Surgery

## 2023-06-04 DIAGNOSIS — M25512 Pain in left shoulder: Secondary | ICD-10-CM | POA: Diagnosis not present

## 2023-06-04 DIAGNOSIS — M898X1 Other specified disorders of bone, shoulder: Secondary | ICD-10-CM | POA: Diagnosis not present

## 2023-06-09 ENCOUNTER — Other Ambulatory Visit: Payer: Self-pay | Admitting: Family Medicine

## 2023-06-09 DIAGNOSIS — R053 Chronic cough: Secondary | ICD-10-CM | POA: Diagnosis not present

## 2023-06-09 DIAGNOSIS — F32A Depression, unspecified: Secondary | ICD-10-CM

## 2023-06-09 DIAGNOSIS — R918 Other nonspecific abnormal finding of lung field: Secondary | ICD-10-CM | POA: Diagnosis not present

## 2023-06-11 ENCOUNTER — Ambulatory Visit
Admission: RE | Admit: 2023-06-11 | Discharge: 2023-06-11 | Disposition: A | Discharge status: Home / Self Care | Payer: Medicare Other | Source: Ambulatory Visit | Attending: Orthopaedic Surgery | Admitting: Orthopaedic Surgery

## 2023-06-11 DIAGNOSIS — M898X1 Other specified disorders of bone, shoulder: Secondary | ICD-10-CM | POA: Insufficient documentation

## 2023-06-11 DIAGNOSIS — M7581 Other shoulder lesions, right shoulder: Secondary | ICD-10-CM | POA: Diagnosis not present

## 2023-06-11 DIAGNOSIS — M129 Arthropathy, unspecified: Secondary | ICD-10-CM | POA: Diagnosis not present

## 2023-06-11 DIAGNOSIS — M899 Disorder of bone, unspecified: Secondary | ICD-10-CM | POA: Insufficient documentation

## 2023-06-11 DIAGNOSIS — M25511 Pain in right shoulder: Secondary | ICD-10-CM | POA: Diagnosis not present

## 2023-06-11 MED ORDER — GADOBUTROL 1 MMOL/ML IV SOLN
5.0000 mL | Freq: Once | INTRAVENOUS | Status: AC | PRN
  Administered 2023-06-11: 5 mL via INTRAVENOUS

## 2023-06-13 LAB — ANA+ENA+DNA/DS+SCL 70+SJOSSA/B
ANA Titer 1: POSITIVE — AB
ENA RNP Ab: 1 AI — ABNORMAL HIGH (ref 0.0–0.9)
ENA SM Ab Ser-aCnc: <0.2 AI (ref 0.0–0.9)
ENA SSA (RO) Ab: <0.2 AI (ref 0.0–0.9)
ENA SSB (LA) Ab: <0.2 AI (ref 0.0–0.9)
Scleroderma (Scl-70) (ENA) Antibody, IgG: <0.2 AI (ref 0.0–0.9)
dsDNA Ab: <1 [IU]/mL (ref 0–9)

## 2023-06-13 LAB — FANA STAINING PATTERNS: Speckled Pattern: 1:80 {titer}

## 2023-06-13 LAB — QUANTIFERON-TB GOLD PLUS
QuantiFERON Mitogen Value: 7.26 [IU]/mL
QuantiFERON Nil Value: 0.03 [IU]/mL
QuantiFERON TB1 Ag Value: 0.03 [IU]/mL
QuantiFERON TB2 Ag Value: 0.04 [IU]/mL
QuantiFERON-TB Gold Plus: NEGATIVE

## 2023-06-13 LAB — ANTI-CCP AB, IGG + IGA (RDL): Anti-CCP Ab, IgG + IgA (RDL): <20 U (ref <20)

## 2023-06-13 LAB — SEDIMENTATION RATE: Sed Rate: 2 mm/h (ref 0–40)

## 2023-06-20 DIAGNOSIS — M19012 Primary osteoarthritis, left shoulder: Secondary | ICD-10-CM | POA: Diagnosis not present

## 2023-06-20 DIAGNOSIS — M25511 Pain in right shoulder: Secondary | ICD-10-CM | POA: Diagnosis not present

## 2023-06-20 DIAGNOSIS — M25512 Pain in left shoulder: Secondary | ICD-10-CM | POA: Diagnosis not present

## 2023-06-22 ENCOUNTER — Telehealth: Payer: Self-pay | Admitting: Internal Medicine

## 2023-06-22 NOTE — Telephone Encounter (Signed)
Pam   Rasheen Marion Salazar = you are seeing on 06/27/2023.  ENA and ANA are trace positive.  You could offer her a rheumatology nonurgent evaluation   Thanks  MR

## 2023-06-24 NOTE — Progress Notes (Deleted)
 @Patient  ID: Pam Salazar, female    DOB: 1945/09/05, 77 y.o.   MRN: [id number]  No chief complaint on file.   Referring provider: Maribeth Camellia MATSU, MD  HPI:    06/27/2023 Serology trace positive, consider referral to rheum  Allergies  Allergen Reactions   Duloxetine Hcl Other (See Comments)    depression worsened   Ezetimibe-Simvastatin  Other (See Comments)    Joint pain    Immunization History  Administered Date(s) Administered   Influenza Split 06/28/2013, 05/27/2014, 04/21/2017   Influenza, High Dose Seasonal PF 05/19/2018, 02/25/2019, 03/12/2021   Influenza-Unspecified 05/25/2015, 05/24/2017, 03/13/2020, 05/29/2022   PFIZER(Purple Top)SARS-COV-2 Vaccination 08/06/2019, 08/31/2019, 03/13/2020, 11/30/2020, 03/12/2021   Pfizer Covid-19 Vaccine Bivalent Booster 10yrs & up 07/09/2021   Pneumococcal Conjugate-13 08/03/2013   Pneumococcal Polysaccharide-23 09/14/2012, 07/11/2015   Respiratory Syncytial Virus Vaccine,Recomb Aduvanted(Arexvy) 05/29/2022   Tdap 02/25/2019   Unspecified SARS-COV-2 Vaccination 03/13/2020   Zoster Recombinant(Shingrix) 07/09/2021   Zoster, Live 06/24/2008    Past Medical History:  Diagnosis Date   Arthritis    hips, lower back   Basal cell carcinoma    Breast lump 10/21/2011   Overview:  Negative diagnostic mammogram 09/2011    Depression    Fracture of rib of right side 08/17/2013   Hypercholesterolemia    Hypothyroidism    Low blood pressure 10/25/2014   Panic disorder    previous agoraphobia   Seizures (HCC)    febrile - as infant   Shoulder pain, left    bursa   Squamous cell carcinoma of skin    Squamous cell carcinoma of skin 08/07/2022   L lateral lower leg, EDC sched 09/04/22 at 11:45 AM   Vertigo    positional - rare   Vitamin D  deficiency     Tobacco History: Social History   Tobacco Use  Smoking Status Former   Types: Cigarettes  Smokeless Tobacco Former   Quit date: 07/26/2013  Tobacco Comments        Counseling given: Not Answered Tobacco comments:     Outpatient Medications Prior to Visit  Medication Sig Dispense Refill   busPIRone  (BUSPAR ) 7.5 MG tablet Take 1 tablet (7.5 mg total) by mouth 2 (two) times daily. 180 tablet 2   clonazePAM  (KLONOPIN ) 0.5 MG tablet TAKE 2 TABLETS BY MOUTH TWICE A DAY AS NEEDED FOR ANXIETY 120 tablet 0   levothyroxine  (SYNTHROID ) 125 MCG tablet TAKE 1 TABLET BY MOUTH DAILY BEFORE BREAKFAST 90 tablet 1   Multiple Vitamins-Minerals (PRESERVISION AREDS 2 PO) Take 1 capsule by mouth 2 (two) times daily.      sertraline  (ZOLOFT ) 100 MG tablet TAKE 2 TABLETS BY MOUTH DAILY 180 tablet 1   simvastatin  (ZOCOR ) 20 MG tablet Take 1 tablet (20 mg total) by mouth every evening. 90 tablet 1   No facility-administered medications prior to visit.      Review of Systems  Review of Systems   Physical Exam  There were no vitals taken for this visit. Physical Exam   Lab Results:  CBC    Component Value Date/Time   WBC 5.1 06/05/2020 1124   RBC 4.38 06/05/2020 1124   HGB 12.6 06/05/2020 1124   HCT 38.2 06/05/2020 1124   PLT 182.0 06/05/2020 1124   MCV 87.2 06/05/2020 1124   MCH 30.3 12/22/2017 0841   MCHC 33.0 06/05/2020 1124   RDW 14.2 06/05/2020 1124   LYMPHSABS 1.5 09/18/2018 1359   MONOABS 0.5 09/18/2018 1359   EOSABS 0.0 09/18/2018 1359  BASOSABS 0.0 09/18/2018 1359    BMET    Component Value Date/Time   NA 141 08/28/2022 1229   K 3.7 08/28/2022 1229   CL 106 08/28/2022 1229   CO2 26 08/28/2022 1229   GLUCOSE 76 08/28/2022 1229   BUN 26 (H) 08/28/2022 1229   CREATININE 0.83 08/28/2022 1229   CREATININE 0.87 09/23/2016 1535   CALCIUM  9.3 08/28/2022 1229   GFRNONAA >60 04/10/2017 1203   GFRAA >60 04/10/2017 1203    BNP No results found for: BNP  ProBNP No results found for: PROBNP  Imaging: MR SHOULDER RIGHT W WO CONTRAST Result Date: 06/11/2023 CLINICAL DATA:  Right shoulder pain EXAM: MRI OF THE RIGHT SHOULDER  WITHOUT AND WITH CONTRAST TECHNIQUE: Multiplanar, multisequence MR imaging of the right shoulder was performed before and after the administration of intravenous contrast. CONTRAST:  5mL GADAVIST  GADOBUTROL  1 MMOL/ML IV SOLN COMPARISON:  03/26/2006 FINDINGS: Rotator cuff: Mild supraspinatus tendinosis with a small insertional interstitial tear. Moderate infraspinatus tendinosis. Teres minor tendon is intact. Mild subscapularis tendinosis. Muscles: No muscle atrophy or edema. No intramuscular fluid collection or hematoma. Biceps Long Head: Intraarticular and extraarticular portions of the biceps tendon are intact. Acromioclavicular Joint: Mild arthropathy of the acromioclavicular joint. Trace subacromial/subdeltoid bursal fluid. Glenohumeral Joint: No joint effusion. Mild partial-thickness cartilage loss of the glenohumeral joint. Labrum: Superior labral degeneration. Bones: No fracture or dislocation. 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 No surrounding bone marrow edema, periosteal reaction or bone destruction. 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 Other: No fluid collection or hematoma. IMPRESSION: 1. Mild supraspinatus tendinosis with a small insertional interstitial tear. 2. Moderate infraspinatus tendinosis. 3. Mild subscapularis tendinosis. 4. 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 No surrounding bone marrow edema, periosteal reaction or bone destruction. 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 Electronically Signed   By: Julaine Blanch M.D.   On: 06/11/2023 15:41     Assessment & Plan:   No problem-specific Assessment & Plan notes found for this encounter.     Almarie LELON Ferrari, NP 06/24/2023

## 2023-06-24 NOTE — Telephone Encounter (Signed)
Will do, thanks

## 2023-06-26 ENCOUNTER — Ambulatory Visit: Payer: Medicare Other | Admitting: Internal Medicine

## 2023-06-26 DIAGNOSIS — R053 Chronic cough: Secondary | ICD-10-CM

## 2023-06-26 DIAGNOSIS — R918 Other nonspecific abnormal finding of lung field: Secondary | ICD-10-CM

## 2023-06-26 LAB — PULMONARY FUNCTION TEST
DL/VA % pred: 49 %
DL/VA: 2.06 ml/min/mmHg/L
DLCO cor % pred: 57 %
DLCO cor: 10.25 ml/min/mmHg
DLCO unc % pred: 57 %
DLCO unc: 10.25 ml/min/mmHg
FEF 25-75 Post: 2.78 L/s
FEF 25-75 Pre: 1.88 L/s
FEF2575-%Change-Post: 47 %
FEF2575-%Pred-Post: 192 %
FEF2575-%Pred-Pre: 130 %
FEV1-%Change-Post: 7 %
FEV1-%Pred-Post: 142 %
FEV1-%Pred-Pre: 132 %
FEV1-Post: 2.64 L
FEV1-Pre: 2.46 L
FEV1FVC-%Change-Post: 3 %
FEV1FVC-%Pred-Pre: 101 %
FEV6-%Change-Post: 4 %
FEV6-%Pred-Post: 141 %
FEV6-%Pred-Pre: 136 %
FEV6-Post: 3.35 L
FEV6-Pre: 3.21 L
FEV6FVC-%Change-Post: 0 %
FEV6FVC-%Pred-Post: 105 %
FEV6FVC-%Pred-Pre: 104 %
FVC-%Change-Post: 3 %
FVC-%Pred-Post: 134 %
FVC-%Pred-Pre: 129 %
FVC-Post: 3.35 L
FVC-Pre: 3.24 L
Post FEV1/FVC ratio: 79 %
Post FEV6/FVC ratio: 100 %
Pre FEV1/FVC ratio: 76 %
Pre FEV6/FVC Ratio: 99 %
RV % pred: 93 %
RV: 2.09 L
TLC % pred: 113 %
TLC: 5.41 L

## 2023-06-27 ENCOUNTER — Telehealth: Payer: Self-pay | Admitting: Family Medicine

## 2023-06-27 ENCOUNTER — Telehealth: Payer: Medicare Other | Admitting: Primary Care

## 2023-06-27 DIAGNOSIS — R053 Chronic cough: Secondary | ICD-10-CM

## 2023-06-27 NOTE — Telephone Encounter (Signed)
 Source Pam Salazar (Patient)   Subject Pam Salazar (Patient)   Topic Referral - Request for Referral  Communication Did the patient discuss referral with their provider in the last year? No  (If No - schedule appointment)  (If Yes - send message)    Appointment offered? No Had an appt with NP Hope today. Appt was cancelled Np Hope was out sick.    Type of order/referral and detailed reason for visit: Pulmonology Patient had a pulmonary function test and wants to switch    Preference of office, provider, location: [facility] Pulmonologist Dr. Florina Gobble Fax Number: [phone] [address] [place], [place] [address]    If referral order, have you been seen by this specialty before? Yes  (If Yes, this issue or another issue? When? Where?    Can we respond through MyChart? Yes

## 2023-06-27 NOTE — Telephone Encounter (Signed)
 Referral placed.

## 2023-06-27 NOTE — Addendum Note (Signed)
 Addended by: Glori Luis on: 06/27/2023 04:07 PM   Modules accepted: Orders

## 2023-06-27 NOTE — Telephone Encounter (Signed)
 Attempted to call pt, no answer.  Sent pt a [facility] geologist.

## 2023-07-02 DIAGNOSIS — M25512 Pain in left shoulder: Secondary | ICD-10-CM | POA: Diagnosis not present

## 2023-07-07 ENCOUNTER — Encounter: Payer: Self-pay | Admitting: Family Medicine

## 2023-07-07 ENCOUNTER — Ambulatory Visit: Payer: Medicare Other | Admitting: Family Medicine

## 2023-07-07 VITALS — BP 90/60 | HR 70 | Temp 97.7°F | Resp 18 | Ht 63.0 in | Wt 120.4 lb

## 2023-07-07 DIAGNOSIS — B351 Tinea unguium: Secondary | ICD-10-CM

## 2023-07-07 DIAGNOSIS — Z79899 Other long term (current) drug therapy: Secondary | ICD-10-CM

## 2023-07-07 DIAGNOSIS — R053 Chronic cough: Secondary | ICD-10-CM | POA: Diagnosis not present

## 2023-07-07 DIAGNOSIS — E7800 Pure hypercholesterolemia, unspecified: Secondary | ICD-10-CM | POA: Diagnosis not present

## 2023-07-07 DIAGNOSIS — E039 Hypothyroidism, unspecified: Secondary | ICD-10-CM

## 2023-07-07 DIAGNOSIS — F39 Unspecified mood [affective] disorder: Secondary | ICD-10-CM

## 2023-07-07 MED ORDER — CLONAZEPAM 1 MG PO TABS: 1.0000 mg | ORAL_TABLET | Freq: Two times a day (BID) | ORAL | 0 refills | Status: DC | PRN

## 2023-07-07 MED ORDER — CICLOPIROX OLAMINE 0.77 % EX SUSP: CUTANEOUS | 0 refills | Status: DC

## 2023-07-07 NOTE — Patient Instructions (Signed)
 It was a pleasure meeting you today. Thank you for allowing me to take part in your health care.  Our goals for today as we discussed include:  Refill sent for requested medication  Use Ciclopirox  on left great toe for fungal infection If no improvement after 4 weeks follow up with PCP   This is a list of the screening recommended for you and due dates:  Health Maintenance  Topic Date Due   COVID-19 Vaccine (8 - 2024-25 season) 02/23/2023   Medicare Annual Wellness Visit  05/11/2023   Flu Shot  09/22/2023*   Mammogram  03/04/2024   DTaP/Tdap/Td vaccine (2 - Td or Tdap) 02/24/2029   Pneumonia Vaccine  Completed   DEXA scan (bone density measurement)  Completed   Hepatitis C Screening  Completed   Zoster (Shingles) Vaccine  Completed   HPV Vaccine  Aged Out   Colon Cancer Screening  Discontinued  *Topic was postponed. The date shown is not the original due date.    Follow up as needed  If you have any questions or concerns, please do not hesitate to call the office at [phone].  I look forward to our next visit and until then take care and stay safe.  Regards,   Glenys Ferrari, MD   [facility]

## 2023-07-07 NOTE — Progress Notes (Signed)
 SUBJECTIVE:   Chief Complaint  Patient presents with   Establish Care    Transfer care from Dr. Maribeth   HPI Presents to clinic to transfer care  Discussed the use of AI scribe software for clinical note transcription with the patient, who gave verbal consent to proceed.  History of Present Illness The patient, previously under the care of Dr. Maribeth, presents for a transfer of care. They have been on Buspar  (buspirone ) 7.5mg  twice daily for the past six months for anxiety and depression. The patient reports that the medication may be helping, but there is no significant difference noted. They have a history of psychiatric treatment, which they discontinued due to negative experiences.  The patient's anxiety has been exacerbated by recent family health issues, including their eldest daughter's leukemia diagnosis. Despite these stressors, the patient believes they are managing well. 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  The patient has been on Zoloft  for a long time, currently taking 200mg  daily, and reports it continues to work well. They also take Clonazepam  1mg  twice daily, which they have been on since 1993. They have tried to wean off this medication in the past but found the withdrawal symptoms intolerable. They are not interested in attempting to discontinue this medication again.  The patient also takes Synthroid  (levothyroxine ) 125mg  in the morning and Zocor  (simvastatin ) 20mg . They report a long-term cough and have been seen by Dr. Geronimo for this issue. They have had lab work and a pulmonary function test, with some abnormal results, and are due for a high-resolution CT chest scan.  The patient reports a recent injury to their back after colliding with a doorknob. The pain, initially localized to the back, has now moved around to the front and is causing discomfort when sleeping on that side. They also report a persistent fungal infection  on their left big toe.  The patient has a history of smoking, which they quit in 2013. They have had a bone scan in the past to check for osteoporosis but do not recall when this was. They are unsure if they have had their flu vaccine for this season. They have had a colonoscopy in the past and have had polyps removed, but again, they do not recall when this was.      PERTINENT PMH / PSH: As above  OBJECTIVE:  BP 90/60   Pulse 70   Temp 97.7 F (36.5 C) (Oral)   Resp 18   Ht 5' 3 (1.6 m)   Wt 120 lb 6 oz (54.6 kg)   SpO2 100%   BMI 21.32 kg/m    Physical Exam Vitals reviewed.  Constitutional:      General: She is not in acute distress.    Appearance: She is not ill-appearing.  HENT:     Head: Normocephalic.     Right Ear: Tympanic membrane, ear canal and external ear normal.     Left Ear: Tympanic membrane, ear canal and external ear normal.     Nose: Nose normal.     Mouth/Throat:     Mouth: Mucous membranes are moist.  Eyes:     Extraocular Movements: Extraocular movements intact.     Conjunctiva/sclera: Conjunctivae normal.     Pupils: Pupils are equal, round, and reactive to light.  Neck:     Thyroid : No thyromegaly or thyroid  tenderness.     Vascular: No carotid bruit.  Cardiovascular:     Rate and Rhythm: Normal  rate and regular rhythm.     Pulses: Normal pulses.     Heart sounds: Normal heart sounds.  Pulmonary:     Effort: Pulmonary effort is normal.     Breath sounds: Normal breath sounds.  Abdominal:     General: Bowel sounds are normal. There is no distension.     Palpations: Abdomen is soft.     Tenderness: There is no abdominal tenderness. There is no right CVA tenderness, left CVA tenderness, guarding or rebound.  Musculoskeletal:        General: Normal range of motion.     Cervical back: Normal range of motion.     Right lower leg: No edema.     Left lower leg: No edema.  Feet:     Right foot:     Toenail Condition: Fungal disease present.     Left foot:     Toenail Condition: Fungal disease present. Lymphadenopathy:     Cervical: No cervical adenopathy.  Skin:    Capillary Refill: Capillary refill takes less than 2 seconds.  Neurological:     General: No focal deficit present.     Mental Status: She is alert and oriented to person, place, and time. Mental status is at baseline.     Motor: No weakness.  Psychiatric:        Mood and Affect: Mood normal.        Behavior: Behavior normal.        Thought Content: Thought content normal.        Judgment: Judgment normal.           07/14/2023   10:50 AM 07/07/2023    3:11 PM 03/31/2023    1:21 PM 01/30/2023    1:21 PM 12/25/2022   12:03 PM  Depression screen PHQ 2/9  Decreased Interest 1 1 0 1 0  Down, Depressed, Hopeless 0 1 0 0 0  PHQ - 2 Score 1 2 0 1 0  Altered sleeping 3 1 3 2  0  Tired, decreased energy 0 1 3 1  0  Change in appetite 0 0 3 0 0  Feeling bad or failure about yourself  0 0 0 0 0  Trouble concentrating 0 0 0 0 0  Moving slowly or fidgety/restless 0 0 0 0 0  Suicidal thoughts 0 0 0 0 0  PHQ-9 Score 4 4 9 4  0  Difficult doing work/chores Somewhat difficult Somewhat difficult Extremely dIfficult Somewhat difficult Not difficult at all      07/07/2023    3:11 PM 03/31/2023    1:21 PM 01/30/2023    1:22 PM 12/25/2022   12:03 PM  GAD 7 : Generalized Anxiety Score  Nervous, Anxious, on Edge 0 3 1 0  Control/stop worrying 1 0 0 0  Worry too much - different things 0 0 0 0  Trouble relaxing 0 0 0 0  Restless 0 0 0 0  Easily annoyed or irritable 1 2 1  0  Afraid - awful might happen 0 0 0 0  Total GAD 7 Score 2 5 2  0  Anxiety Difficulty Somewhat difficult Very difficult Somewhat difficult Not difficult at all    ASSESSMENT/PLAN:  Onychomycosis Assessment & Plan: Patient reports persistent discoloration of left big toe. -Order liver function tests. -Start topical antifungal cream. If no improvement and liver function tests are normal, consider oral  antifungal medication.   Mood disorder (HCC) Assessment & Plan: Stable on Buspar  7.5mg  twice daily and Zoloft  200mg  daily. No  recent changes or adjustments.Patient on 1mg  twice daily (2 tablets of 0.5mg ). Previously attempted to wean down to 0.5mg  twice daily but reported not doing well. Discussed risk of longterm use of benzodiazepines. -Continue current regimen. -Refill Clonazepam  1 mg BID ar needed.   -Complete non-opioid contract. -UDS today   Orders: -     clonazePAM ; Take 1 tablet (1 mg total) by mouth 2 (two) times daily as needed for anxiety.  Dispense: 60 tablet; Refill: 0 -     ToxASSURE Select 13 (MW), Urine  Controlled substance agreement signed -     ToxASSURE Select 13 (MW), Urine  Hypothyroidism, unspecified type Assessment & Plan: Stable on Synthroid  125mcg daily. -Continue current regimen.   Hypercholesterolemia Assessment & Plan: Stable on Simvastatin  20mg  daily. -Continue current regimen.   Chronic cough Assessment & Plan: Patient reports long-term cough. Recently seen by pulmonologist and had labs and pulmonary function tests. Follow-up with pulmonologist scheduled for end of February. -Continue follow-up with pulmonologist as planned.    PDMP reviewed  Return if symptoms worsen or fail to improve, for PCP.  Glenys Ferrari, MD

## 2023-07-08 DIAGNOSIS — F39 Unspecified mood [affective] disorder: Secondary | ICD-10-CM | POA: Diagnosis not present

## 2023-07-08 DIAGNOSIS — Z79899 Other long term (current) drug therapy: Secondary | ICD-10-CM | POA: Diagnosis not present

## 2023-07-08 NOTE — Telephone Encounter (Unsigned)
 Copied from CRM [id number]. Topic: Clinical - Prescription Issue >> Jul 08, 2023  2:29 PM Rolin D wrote: Reason for CRM: Patient seen Dr.Walsh yesterday and was prescribed ciclopirox  (LOPROX ) 0.77 % SUSP. Patient stated that the medication is $60 and she can not afford it so she did not pick up medication . Patient would like a call back regarding a generic brand that her insurance may take .

## 2023-07-09 DIAGNOSIS — M7542 Impingement syndrome of left shoulder: Secondary | ICD-10-CM | POA: Diagnosis not present

## 2023-07-09 DIAGNOSIS — M25512 Pain in left shoulder: Secondary | ICD-10-CM | POA: Diagnosis not present

## 2023-07-11 DIAGNOSIS — M25512 Pain in left shoulder: Secondary | ICD-10-CM | POA: Diagnosis not present

## 2023-07-11 DIAGNOSIS — M7542 Impingement syndrome of left shoulder: Secondary | ICD-10-CM | POA: Diagnosis not present

## 2023-07-11 LAB — TOXASSURE SELECT 13 (MW), URINE

## 2023-07-14 ENCOUNTER — Ambulatory Visit: Payer: Medicare Other | Admitting: *Deleted

## 2023-07-14 ENCOUNTER — Telehealth: Payer: Self-pay | Admitting: Family Medicine

## 2023-07-14 ENCOUNTER — Other Ambulatory Visit: Payer: Self-pay | Admitting: Family Medicine

## 2023-07-14 VITALS — Ht 63.0 in | Wt 121.0 lb

## 2023-07-14 DIAGNOSIS — B351 Tinea unguium: Secondary | ICD-10-CM

## 2023-07-14 DIAGNOSIS — Z0000 Encounter for general adult medical examination without abnormal findings: Secondary | ICD-10-CM

## 2023-07-14 DIAGNOSIS — Z780 Asymptomatic menopausal state: Secondary | ICD-10-CM

## 2023-07-14 MED ORDER — CICLOPIROX 8 % EX SOLN: Freq: Every day | CUTANEOUS | 0 refills | Status: DC

## 2023-07-14 NOTE — Telephone Encounter (Signed)
Called pt and then I called the pharmacy and they stated that the medication went up to 66.00.   Pharmacy stated that this is different then what provider ordered.

## 2023-07-14 NOTE — Telephone Encounter (Signed)
Called and spoke to pt and let her know the new price would be 21.68.

## 2023-07-14 NOTE — Progress Notes (Signed)
Subjective:   Pam Salazar is a 78 y.o. female who presents for Medicare Annual (Subsequent) preventive examination.  Visit Complete: Virtual I connected with  Pam Salazar on 07/14/23 by a audio enabled telemedicine application and verified that I am speaking with the correct person using two identifiers.This patient declined Interactive audio and Acupuncturist. Therefore the visit was completed with audio only.   Patient Location: Home  Provider Location: Office/Clinic  I discussed the limitations of evaluation and management by telemedicine. The patient expressed understanding and agreed to proceed.  Vital Signs: Because this visit was a virtual/telehealth visit, some criteria may be missing or patient reported. Any vitals not documented were not able to be obtained and vitals that have been documented are patient reported.   Cardiac Risk Factors include: advanced age (>4men, >71 women);dyslipidemia;Other (see comment), Risk factor comments: history of smoking     Objective:    Today's Vitals   07/14/23 1040  Weight: 121 lb (54.9 kg)  Height: 5\' 3"  (1.6 m)   Body mass index is 21.43 kg/m.     07/14/2023   10:59 AM 10/20/2022    9:38 AM 05/10/2022    1:05 PM 01/31/2022    1:18 PM 06/26/2021   10:03 AM 03/16/2021    4:04 PM 02/01/2020   12:50 PM  Advanced Directives  Does Patient Have a Medical Advance Directive? Yes Yes Yes Yes Yes Yes Yes  Type of Estate agent of [place];Living will Healthcare Power of [place];Living will Healthcare Power of [employer] Power of [employer] Power of [place];Living will Healthcare Power of [place];Living will Healthcare Power of [place];Living will  Does patient want to make changes to medical advance directive?   No - Patient declined No - Patient declined  No - Patient declined No - Patient declined  Copy of Healthcare Power of Attorney in Chart? No - copy requested  No -  copy requested   No - copy requested No - copy requested    Current Medications (verified) Outpatient Encounter Medications as of 07/14/2023  Medication Sig   busPIRone (BUSPAR) 7.5 MG tablet Take 1 tablet (7.5 mg total) by mouth 2 (two) times daily.   clonazePAM (KLONOPIN) 1 MG tablet Take 1 tablet (1 mg total) by mouth 2 (two) times daily as needed for anxiety.   levothyroxine (SYNTHROID) 125 MCG tablet TAKE 1 TABLET BY MOUTH DAILY BEFORE BREAKFAST   Multiple Vitamins-Minerals (PRESERVISION AREDS 2 PO) Take 1 capsule by mouth 2 (two) times daily.    Omega 3 1000 MG CAPS Take by mouth daily.   sertraline (ZOLOFT) 100 MG tablet TAKE 2 TABLETS BY MOUTH DAILY   simvastatin (ZOCOR) 20 MG tablet Take 1 tablet (20 mg total) by mouth every evening.   ciclopirox (LOPROX) 0.77 % SUSP Apply to affected nail once daily for 4 weeks (Patient not taking: Reported on 07/14/2023)   No facility-administered encounter medications on file as of 07/14/2023.    Allergies (verified) Duloxetine hcl and Ezetimibe-simvastatin   History: Past Medical History:  Diagnosis Date   Arthritis    hips, lower back   Basal cell carcinoma    Breast lump 10/21/2011   Overview:  Negative diagnostic mammogram 09/2011    Depression    Fracture of rib of right side 08/17/2013   Hypercholesterolemia    Hypothyroidism    Low blood pressure 10/25/2014   Panic disorder    previous agoraphobia   Seizures (HCC)    febrile - as  infant   Shoulder pain, left    bursa   Squamous cell carcinoma of skin    Squamous cell carcinoma of skin 08/07/2022   L lateral lower leg, EDC sched 09/04/22 at 11:45 AM   Vertigo    positional - rare   Vitamin D deficiency    Past Surgical History:  Procedure Laterality Date   ABDOMINAL HYSTERECTOMY  1981   partial - secondary to bleeding, ovaries not removed -  vaginal   CATARACT EXTRACTION W/ INTRAOCULAR LENS  IMPLANT, BILATERAL     COLONOSCOPY WITH PROPOFOL N/A 05/05/2015   Procedure:  COLONOSCOPY WITH PROPOFOL;  Surgeon: Midge Minium, MD;  [facility];  Service: Endoscopy;  Laterality: N/A;   COLONOSCOPY WITH PROPOFOL N/A 06/26/2021   Procedure: COLONOSCOPY WITH PROPOFOL;  Surgeon: Wyline Mood, MD;  Location: [facility] END[facility];  Service: Gastroenterology;  Laterality: N/A;   MASS EXCISION Right 12/24/2017   Procedure: EXCISION MASS ( SHIN );  Surgeon: Renford Dills, MD;  Location: [facility] ORS;  Service: Vascular;  Laterality: Right;   ORIF FIBULA FRACTURE Right 2003   right lower extremitiy   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   shoulder Left    bone suprs   SHOULDER SURGERY  12/2015   TONSILLECTOMY  1974   TOTAL HIP ARTHROPLASTY Right 03/2023   Family History  Problem Relation Age of Onset   Arthritis Mother    Hyperlipidemia Mother    Arthritis Father    Prostate cancer Father    Breast cancer Sister 71   Heart disease Brother        heart attack   Diabetes Brother    Breast cancer Maternal Aunt 78   Colon cancer Maternal Uncle    Social History   Socioeconomic History   Marital status: Married    Spouse name: Not on file   Number of children: Not on file   Years of education: Not on file   Highest education level: Bachelor's degree (e.g., BA, AB, BS)  Occupational History   Not on file  Tobacco Use   Smoking status: Former    Types: Cigarettes   Smokeless tobacco: Former    Quit date: 07/26/2013   Tobacco comments:       Vaping Use   Vaping status: Never Used  Substance and Sexual Activity   Alcohol use: No    Alcohol/week: 0.0 standard drinks of alcohol   Drug use: No   Sexual activity: Yes    Birth control/protection: Post-menopausal  Other Topics Concern   Not on file  Social History Narrative   Former Engineer, civil (consulting)   Social Drivers of Health   Financial Resource Strain: Low Risk  (07/14/2023)   Overall Financial Resource Strain (CARDIA)    Difficulty of Paying Living Expenses: Not hard at all  Food Insecurity: No Food  Insecurity (07/14/2023)   Hunger Vital Sign    Worried About Running Out of Food in the Last Year: Never true    Ran Out of Food in the Last Year: Never true  Transportation Needs: No Transportation Needs (07/14/2023)   PRAPARE - Administrator, Civil Service (Medical): No    Lack of Transportation (Non-Medical): No  Physical Activity: Inactive (07/14/2023)   Exercise Vital Sign    Days of Exercise per Week: 0 days    Minutes of Exercise per Session: 0 min  Stress: No Stress Concern Present (07/14/2023)   [employer] of Occupational Health - Occupational Stress Questionnaire  Feeling of Stress : Not at all  Social Connections: Socially Integrated (07/14/2023)   Social Connection and Isolation Panel [NHANES]    Frequency of Communication with Friends and Family: More than three times a week    Frequency of Social Gatherings with Friends and Family: More than three times a week    Attends Religious Services: More than 4 times per year    Active Member of [employer] or Organizations: Yes    Attends Engineer, structural: More than 4 times per year    Marital Status: Married    Tobacco Counseling Counseling given: Not Answered Tobacco comments:     Clinical Intake:  Pre-visit preparation completed: Yes  Pain : No/denies pain     BMI - recorded: 21.43 Nutritional Status: BMI of 19-24  Normal Nutritional Risks: None Diabetes: No  How often do you need to have someone help you when you read instructions, pamphlets, or other written materials from your doctor or pharmacy?: 1 - Never  Interpreter Needed?: No  Information entered by :: R. Sheralyn Pinegar LPN   Activities of Daily Living    07/14/2023   10:43 AM  In your present state of health, do you have any difficulty performing the following activities:  Hearing? 1  Comment wears aids  Vision? 0  Comment glasses  Difficulty concentrating or making decisions? 1  Comment remembering things  Walking or  climbing stairs? 0  Dressing or bathing? 0  Doing errands, shopping? 0  Preparing Food and eating ? N  Using the Toilet? N  In the past six months, have you accidently leaked urine? Y  Comment once  Do you have problems with loss of bowel control? N  Managing your Medications? N  Managing your Finances? N  Housekeeping or managing your Housekeeping? N    Patient Care Team: Glori Luis, MD as PCP - General (Family Medicine) Kieth Brightly, MD as Consulting Physician (General Surgery) [place] Alfred, MD (Internal Medicine)  Indicate any recent Medical Services you may have received from other than [facility] providers in the past year (date may be approximate).     Assessment:   This is a routine wellness examination for Chan.  Hearing/Vision screen Hearing Screening - Comments:: Wears aids Vision Screening - Comments:: glasses   Goals Addressed             This Visit's Progress    Patient Stated       Wants to be more active and move more       Depression Screen    07/14/2023   10:50 AM 07/07/2023    3:11 PM 03/31/2023    1:21 PM 01/30/2023    1:21 PM 12/25/2022   12:03 PM 10/21/2022    4:44 PM 09/24/2022    8:11 AM  PHQ 2/9 Scores  PHQ - 2 Score 1 2 0 1 0 0 1  PHQ- 9 Score 4 4 9 4  0 3 4    Fall Risk    07/14/2023   10:45 AM 07/07/2023    3:11 PM 03/31/2023    1:20 PM 01/30/2023    1:21 PM 12/25/2022   12:02 PM  Fall Risk   Falls in the past year? 0 0 0 0 0  Number falls in past yr: 0 0 0 0 0  Injury with Fall? 0 0 0 0 0  Risk for fall due to : No Fall Risks No Fall Risks No Fall Risks No Fall Risks No Fall Risks  Follow up Falls prevention discussed;Falls evaluation completed Falls evaluation completed Falls evaluation completed Falls evaluation completed Falls evaluation completed    MEDICARE RISK AT HOME: Medicare Risk at Home Any stairs in or around the home?: Yes If so, are there any without handrails?: No Home free of loose throw rugs in  walkways, pet beds, electrical cords, etc?: Yes Adequate lighting in your home to reduce risk of falls?: Yes Life alert?: No Use of a cane, walker or w/c?: No Grab bars in the bathroom?: No Shower chair or bench in shower?: Yes Elevated toilet seat or a handicapped toilet?: No   Cognitive Function:    01/13/2018   12:34 PM 07/11/2015    1:44 PM  MMSE - Mini Mental State Exam  Orientation to time 5 5  Orientation to Place 5 5  Registration 3 3  Attention/ Calculation 5 5  Recall 3 3  Language- name 2 objects 2 2  Language- repeat 1 1  Language- follow 3 step command 3 3  Language- read & follow direction 1 1  Write a sentence 1 1  Copy design 1 1  Total score 30 30        07/14/2023   10:59 AM 05/10/2022    1:15 PM 01/15/2019   10:03 AM  6CIT Screen  What Year? 0 points 0 points 0 points  What month? 0 points 0 points 0 points  What time? 0 points 0 points 0 points  Count back from 20 0 points 0 points 0 points  Months in reverse 2 points 0 points 0 points  Repeat phrase 0 points 0 points   Total Score 2 points 0 points     Immunizations Immunization History  Administered Date(s) Administered   Influenza Split 06/28/2013, 05/27/2014, 04/21/2017   Influenza, High Dose Seasonal PF 05/19/2018, 02/25/2019, 03/12/2021   Influenza-Unspecified 05/25/2015, 05/24/2017, 03/13/2020, 05/29/2022   PFIZER(Purple Top)SARS-COV-2 Vaccination 08/06/2019, 08/31/2019, 03/13/2020, 11/30/2020, 03/12/2021   Pfizer Covid-19 Vaccine Bivalent Booster 11yrs & up 07/09/2021   Pneumococcal Conjugate-13 08/03/2013   Pneumococcal Polysaccharide-23 09/14/2012, 07/11/2015   Respiratory Syncytial Virus Vaccine,Recomb Aduvanted(Arexvy) 05/29/2022   Tdap 02/25/2019   Unspecified SARS-COV-2 Vaccination 03/13/2020   Zoster Recombinant(Shingrix) 07/09/2022, 10/17/2022   Zoster, Live 06/24/2008    TDAP status: Up to date  Flu Vaccine status: Due, Education has been provided regarding the importance  of this vaccine. Advised may receive this vaccine at local pharmacy or Health Dept. Aware to provide a copy of the vaccination record if obtained from local pharmacy or Health Dept. Verbalized acceptance and understanding. Called [facility] they had no record  Pneumococcal vaccine status: Up to date  Covid-19 vaccine status: Declined, Education has been provided regarding the importance of this vaccine but patient still declined. Advised may receive this vaccine at local pharmacy or Health Dept.or vaccine clinic. Aware to provide a copy of the vaccination record if obtained from local pharmacy or Health Dept. Verbalized acceptance and understanding.  Qualifies for Shingles Vaccine? Yes   Zostavax completed Yes   Shingrix Completed?: Yes  Screening Tests Health Maintenance  Topic Date Due   COVID-19 Vaccine (8 - 2024-25 season) 02/23/2023   Medicare Annual Wellness (AWV)  05/11/2023   INFLUENZA VACCINE  09/22/2023 (Originally 01/23/2023)   MAMMOGRAM  03/04/2024   DTaP/Tdap/Td (2 - Td or Tdap) 02/24/2029   Pneumonia Vaccine 34+ Years old  Completed   DEXA SCAN  Completed   Hepatitis C Screening  Completed   Zoster Vaccines- Shingrix  Completed  HPV VACCINES  Aged Out   Colonoscopy  Discontinued    Health Maintenance  Health Maintenance Due  Topic Date Due   COVID-19 Vaccine (8 - 2024-25 season) 02/23/2023   Medicare Annual Wellness (AWV)  05/11/2023    Colorectal cancer screening: No longer required.   Mammogram status: Completed 02/2023. Repeat every year  Bone Density status: Completed 08/2015. Results reflect: Bone density results: NORMAL. Repeat every 2 years. Order placed today.  Lung Cancer Screening: (Low Dose CT Chest recommended if Age 24-80 years, 20 pack-year currently smoking OR have quit w/in 15years.) does qualify.    Additional Screening:  Hepatitis C Screening: does qualify; Completed 04/2017  Vision Screening: Recommended annual ophthalmology exams for  early detection of glaucoma and other disorders of the eye. Is the patient up to date with their annual eye exam?  Yes  Who is the provider or what is the name of the office in which the patient attends annual eye exams? [facility] If pt is not established with a provider, would they like to be referred to a provider to establish care? No .   Dental Screening: Recommended annual dental exams for proper oral hygiene    Community Resource Referral / Chronic Care Management: CRR required this visit?  No   CCM required this visit?  No     Plan:     I have personally reviewed and noted the following in the patient's chart:   Medical and social history Use of alcohol, tobacco or illicit drugs  Current medications and supplements including opioid prescriptions. Patient is not currently taking opioid prescriptions. Functional ability and status Nutritional status Physical activity Advanced directives List of other physicians Hospitalizations, surgeries, and ER visits in previous 12 months Vitals Screenings to include cognitive, depression, and falls Referrals and appointments  In addition, I have reviewed and discussed with patient certain preventive protocols, quality metrics, and best practice recommendations. A written personalized care plan for preventive services as well as general preventive health recommendations were provided to patient.     Sydell Axon, LPN   [id number]   After Visit Summary: (MyChart) Due to this being a telephonic visit, the after visit summary with patients personalized plan was offered to patient via MyChart   Nurse Notes: None

## 2023-07-14 NOTE — Patient Instructions (Signed)
Ms. Herran , Thank you for taking time to come for your Medicare Wellness Visit. I appreciate your ongoing commitment to your health goals. Please review the following plan we discussed and let me know if I can assist you in the future.   Referrals/Orders/Follow-Ups/Clinician Recommendations: Bone density. Called [facility] and was advised that they do not have a record of a flu shot for you this season.  You have an order for:  []   2D Mammogram  []   3D Mammogram  [x]   Bone Density     Please call for appointment:  [facility]  [address] [place] [address] [phone]    Make sure to wear two-piece clothing.  No lotions, powders, or deodorants the day of the appointment. Make sure to bring picture ID and insurance card.  Bring list of medications you are currently taking including any supplements.   Schedule your [place] screening mammogram through MyChart!   Log into your MyChart account.  Go to 'Visit' (or 'Appointments' if on mobile App) --> Schedule an Appointment  Under 'Select a Reason for Visit' choose the Mammogram Screening option.  Complete the pre-visit questions and select the time and place that best fits your schedule.    This is a list of the screening recommended for you and due dates:  Health Maintenance  Topic Date Due   COVID-19 Vaccine (8 - 2024-25 season) 02/23/2023   Flu Shot  09/22/2023*   Mammogram  03/04/2024   Medicare Annual Wellness Visit  07/13/2024   DTaP/Tdap/Td vaccine (2 - Td or Tdap) 02/24/2029   Pneumonia Vaccine  Completed   DEXA scan (bone density measurement)  Completed   Hepatitis C Screening  Completed   Zoster (Shingles) Vaccine  Completed   HPV Vaccine  Aged Out   Colon Cancer Screening  Discontinued  *Topic was postponed. The date shown is not the original due date.    Advanced directives: (Copy Requested) Please bring a copy of your health care power of  attorney and living will to the office to be added to your chart at your convenience.  Next Medicare Annual Wellness Visit scheduled for next year: Yes 07/14/2024

## 2023-07-14 NOTE — Telephone Encounter (Signed)
Copied from CRM [id number]. Topic: Clinical - Prescription Issue >> Jul 14, 2023  1:21 PM Chantha C wrote: Reason for CRM: Patient [phone] states ciclopirox (LOPROX) 0.77 % SUSP uis $60 and needs something cheaper clotrimazole 1% solution for $5 and clotrimazole 1% cream 28 days supply for $35. Patient asked if this is as good, if so please send rx to [facility] [id number] [address], [place] - [address] [place] [address] Phone:[phone]

## 2023-07-15 ENCOUNTER — Encounter: Payer: Self-pay | Admitting: Family Medicine

## 2023-07-15 DIAGNOSIS — B351 Tinea unguium: Secondary | ICD-10-CM | POA: Insufficient documentation

## 2023-07-15 DIAGNOSIS — F39 Unspecified mood [affective] disorder: Secondary | ICD-10-CM | POA: Insufficient documentation

## 2023-07-15 DIAGNOSIS — F32A Depression, unspecified: Secondary | ICD-10-CM | POA: Insufficient documentation

## 2023-07-15 DIAGNOSIS — Z79899 Other long term (current) drug therapy: Secondary | ICD-10-CM | POA: Insufficient documentation

## 2023-07-15 NOTE — Assessment & Plan Note (Signed)
Patient reports persistent discoloration of left big toe. -Order liver function tests. -Start topical antifungal cream. If no improvement and liver function tests are normal, consider oral antifungal medication.

## 2023-07-15 NOTE — Assessment & Plan Note (Signed)
Stable on Synthroid daily. -Continue current regimen.

## 2023-07-15 NOTE — Assessment & Plan Note (Signed)
Stable on Buspar 7.5mg  twice daily and Zoloft 200mg  daily. No recent changes or adjustments.Patient on 1mg  twice daily (2 tablets of 0.5mg ). Previously attempted to wean down to 0.5mg  twice daily but reported not doing well. Discussed risk of longterm use of benzodiazepines. -Continue current regimen. -Refill Clonazepam 1 mg BID ar needed.   -Complete non-opioid contract. -UDS today

## 2023-07-15 NOTE — Assessment & Plan Note (Signed)
Patient reports long-term cough. Recently seen by pulmonologist and had labs and pulmonary function tests. Follow-up with pulmonologist scheduled for end of February. -Continue follow-up with pulmonologist as planned.

## 2023-07-15 NOTE — Assessment & Plan Note (Signed)
Stable on Simvastatin 20mg  daily. -Continue current regimen.

## 2023-08-01 ENCOUNTER — Encounter: Payer: Self-pay | Admitting: Internal Medicine

## 2023-08-01 ENCOUNTER — Ambulatory Visit (INDEPENDENT_AMBULATORY_CARE_PROVIDER_SITE_OTHER): Payer: Medicare Other | Admitting: Internal Medicine

## 2023-08-01 VITALS — BP 97/66 | HR 81 | Ht 63.0 in | Wt 123.0 lb

## 2023-08-01 DIAGNOSIS — R918 Other nonspecific abnormal finding of lung field: Secondary | ICD-10-CM

## 2023-08-01 DIAGNOSIS — R768 Other specified abnormal immunological findings in serum: Secondary | ICD-10-CM | POA: Diagnosis not present

## 2023-08-01 DIAGNOSIS — D8989 Other specified disorders involving the immune mechanism, not elsewhere classified: Secondary | ICD-10-CM | POA: Diagnosis not present

## 2023-08-01 DIAGNOSIS — I7300 Raynaud's syndrome without gangrene: Secondary | ICD-10-CM

## 2023-08-01 DIAGNOSIS — Z87891 Personal history of nicotine dependence: Secondary | ICD-10-CM

## 2023-08-01 NOTE — Patient Instructions (Addendum)
 ICD-10-CM   1. Abnormal CT lung screening  R91.8     2. Former smoker  Z87.891     3. 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 ANA positive  R76.8     5. Raynaud's phenomenon without gangrene  I73.00      Low-dose CT scan August 2024 shows likely interstitial lung abnormalities versus atelectasis.  Whether this is clinically significant is unclear.  The autoimmune panel is also trace positive and again do not know clinical significance.  At this point in time you are stable.  The diffusion test shows a slight reduction which could be technical variation but could reflect significance of the interstitial abnormalities.    Plan -- Given the minimal symptoms state I would recommend continued monitoring at this stage  -Do high-resolution CT chest in 5-6 months [cancel lung cancer screening in August 2025]  -Do spirometry and DLCO in 5-6 months  - return ear[place] if getting worse  Follow-up - Return in August 2025 15-minute visit but after testing.

## 2023-08-01 NOTE — Progress Notes (Signed)
 OV 04/24/2023  Subjective:  Patient ID: Pam Salazar, female , DOB: [birth year] , age 78 y.o. , MRN: [id number] , ADDRESS: [address] [place] [id number] PCP Maribeth Camellia MATSU, MD Patient Care Team: Maribeth Camellia MATSU, MD as PCP - General (Family Medicine) Dellie Louanne MATSU, MD as Consulting Physician (General Surgery) Glendia Shad, MD (Internal Medicine)  This Provider for this visit: Treatment Team:  Attending Provider: Geronimo Amel, MD    04/24/2023 -   Chief Complaint  Patient presents with   Consult    Pt is here for Consult visit.  Pt needs ILD explained to her.     HPI Brigit Doke 78 y.o. -retired engineer, civil (consulting).  She used to work in hospice.  She is a Engineer, [employer].  She is a former smoker.  She says she started smoking while she was in [place] at [facility] and then creatinine 2014 or so.  Review of the lung cancer screening notes indicate that she has a 23 pack smoking history and quit in 2014.  She is here because the low-dose CT scan of the chest done in August 2024 showed some reticular opacities in the lung base and therefore she has been referred here.  She says that she was in a two-story house and she only gets dyspneic when she climbs back up for the second time.'s been stable like this for 10 years.  Some 3 weeks ago she had hip surgery and therefore she is not able to mobilize well but she does not think there is any health status change.  There is no chest pain.  She has had annual low-dose CT scans of the chest and I personally visualized this and this basically reticular opacities in the lung base consistent with linear atelectasis or postinflammatory scarring.  This extends back even in the prior ER 2023.  Interstitial lung disease questionnaire done and she brought it with her - Symptoms: Mild dyspnea when climbing stairs and mild fatigue and mild amount of depression level 3.  Her daughter has been diagnosed with leukemia and she was  a healthy 78 year old  - Past medical history relevant ILD: No autoimmune disease no acid reflux no pulmonary hypertension.  She does have hypothyroidism.  She has had COVID boosters and then had COVID disease in 2024   - Review of systems positive for fatigue and arthralgia.  Also positive for dysphagia for the last several years if she eats rice.  She also has some dry eyes.  On 08/01/2023 she reported that fingers to turn blue occasionally when it is cold.   - Family history for lung disease: Mother had COPD  Personal exposure history started smoking 19 6 7  quit 20 1410 cigarettes/day.  She did smoke marijuana 1 time in 1980.  No cocaine use.   - Home and hobby details: [place] home in a single-family home 16 years house was built in 2008.  Extensive organic antigen history is negative except she does plant flowers and works with mulch and woodchips she does have 3 dogs inside but no birds   - Occupational history: She smoked and she [place].  She worked as a engineer, civil (consulting).  Detail organic and inorganic antigen exposure history is negative    CT Chest data from date: **  - personally visualized and independently interpreted : August 2024 personally visualized - my findings are: Postinflammatory atelectasis/linear scarring  Narrative & Impression  CLINICAL DATA:  Former smoker with 23 pack-year history   EXAM: CT CHEST WITHOUT CONTRAST LOW-DOSE FOR LUNG CANCER SCREENING   TECHNIQUE: Multidetector CT imaging of the chest was performed following the standard protocol without IV contrast.   RADIATION DOSE REDUCTION: This exam was performed according to the departmental dose-optimization program which includes automated exposure control, adjustment of the mA and/or kV according to patient size and/or use of iterative reconstruction technique.   COMPARISON:  Chest CT dated January 30, 2022   FINDINGS: Cardiovascular: Normal heart size. No pericardial effusion.  Normal caliber thoracic aorta with moderate calcified plaque. No visible coronary artery calcifications.   Mediastinum/Nodes: Esophagus and thyroid  are unremarkable. No enlarged lymph nodes seen in the chest.   Lungs/Pleura: Central airways are patent. No consolidation, pleural effusion or pneumothorax. Mild lower lung subpleural predominant reticular opacities, unchanged when compared with the prior exam. Stable solid nodule of the right lower lobe measuring 5.2 mm on image 157.   Upper Abdomen: No acute abnormality.   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 No aggressive appearing osseous lesions.   IMPRESSION: 1. Lung-RADS 2S, benign appearance or behavior. Continue annual screening with low-dose chest CT without contrast in 12 months. S modifier for interstitial lung changes. 2. Mild lower lung subpleural predominant reticular opacities, could be chronic sequela of prior infection, although interstitial lung disease could also have this appearance. Correlate with patient's systems and consider pulmonary function tests and/or ILD protocol CT for further evaluation. 3. Aortic Atherosclerosis (ICD10-I70.0).     Electronically Signed   By: Rea Marc M.D.   On: 03/04/2023 16:56    OV 08/01/2023  Subjective:  Patient ID: Pam Salazar, female , DOB: 16-Jul-1945 , age 37 y.o. , MRN: [id number] , ADDRESS: [address] [place] [id number] PCP Hope Merle, MD Patient Care Team: Hope Merle, MD as PCP - General (Family Medicine) Dellie Louanne MATSU, MD as Consulting Physician (General Surgery) Glendia Shad, MD (Internal Medicine)  This Provider for this visit: Treatment Team:  Attending Provider: Geronimo Amel, MD    08/01/2023 -   Chief Complaint  Patient presents with   Follow-up     HPI Pam Salazar 78 y.o. -returns for follow-up to discuss the test results.   Physically she is doing well.  She is not climbing stairs anymore because of her knee but there is no interim shortness of breath or worsening of symptoms.  No emergency room visits or urgent care visits or changes to medications.  Psychologically she is struggling because her daughter has AML and is awaiting a bone marrow transplant in the next couple of months and after that patient will be the caretaker for the daughter in the house.  We reviewed her interstitial lung abnormality workup.  Her ANA is trace positive ANA is trace positive.  In further questioning she reported possible/likely Raynaud's.  Although otherwise she is feeling well.  There is no sicca symptoms.  There is no kidney issues.  We went over these test results.  Pulmonary function test shows a reduction in DLCO to 57%.  Otherwise has a normal spirometry.  We did walke her 200 feet with a forehead probe today and per CMA did NOT desaturate   PFT     Latest Ref Rng & Units 06/26/2023    1:00 PM  PFT Results  FVC-Pre L 3.24   FVC-Predicted Pre % 129   FVC-Post L 3.35   FVC-Predicted Post %  134   Pre FEV1/FVC % % 76   Post FEV1/FCV % % 79   FEV1-Pre L 2.46   FEV1-Predicted Pre % 132   FEV1-Post L 2.64   DLCO uncorrected ml/min/mmHg 10.25   DLCO UNC% % 57   DLCO corrected ml/min/mmHg 10.25   DLCO COR %Predicted % 57   DLVA Predicted % 49   TLC L 5.41   TLC % Predicted % 113   RV % Predicted % 93       Latest Reference Range & Units 06/09/23 15:14  ANA Titer 1  Positive !  dsDNA Ab 0 - 9 IU/mL <1  ENA RNP Ab 0.0 - 0.9 AI 1.0 (H)  ENA SSA (RO) Ab 0.0 - 0.9 AI <0.2  ENA SSB (LA) Ab 0.0 - 0.9 AI <0.2  ENA SM Ab Ser-aCnc 0.0 - 0.9 AI <0.2  Speckled Pattern  1:80  NOTE:  Comment  Anti-CCP Ab, IgG + IgA (RDL) <20 Units <20  Scleroderma (Scl-70) (ENA) Antibody, IgG 0.0 - 0.9 AI <0.2  !: Data is abnormal (H): Data is abnormally high   LAB RESULTS last 96 hours No results found.  LAB RESULTS last 90  days Recent Results (from the past 2160 hours)  Sed Rate (ESR)     Status: None   Collection Time: 06/09/23  3:14 PM  Result Value Ref Range   Sed Rate 2 0 - 40 mm/hr  QuantiFERON-TB Gold Plus     Status: None   Collection Time: 06/09/23  3:14 PM  Result Value Ref Range   QuantiFERON Incubation Incubation performed.    QuantiFERON Criteria Comment     Comment: QuantiFERON-TB Gold Plus is a qualitative indirect test for M tuberculosis infection (including disease) and is intended for use in conjunction with risk assessment, radiography, and other medical and diagnostic evaluations. The QuantiFERON-TB Gold Plus result is determined by subtracting the Nil value from either TB antigen (Ag) value. The Mitogen tube serves as a control for the test.    QuantiFERON TB1 Ag Value 0.03 IU/mL   QuantiFERON TB2 Ag Value 0.04 IU/mL   QuantiFERON Nil Value 0.03 IU/mL   QuantiFERON Mitogen Value 7.26 IU/mL   QuantiFERON-TB Gold Plus Negative Negative    Comment: No response to M tuberculosis antigens detected. Infection with M tuberculosis is unlikely, but high risk individuals should be considered for additional testing (ATS/IDSA/CDC Clinical Practice Guidelines, 2017). The reference range is an Antigen minus Nil result of <0.35 IU/mL. Chemiluminescence immunoassay methodology   Anti-CCP Ab, IgG + IgA (RDL)     Status: None   Collection Time: 06/09/23  3:14 PM  Result Value Ref Range   Anti-CCP Ab, IgG + IgA (RDL) <20 <20 Units    Comment:     Negative: <20             Weak Positive: 20-39     Moderate Positive: 40-59  Strong Positive: >59   ANA+ENA+DNA/DS+Scl 70+SjoSSA/B     Status: Abnormal   Collection Time: 06/09/23  3:14 PM  Result Value Ref Range   ANA Titer 1 Positive (A)     Comment:                                      Negative   <1:80  Borderline  1:80                                      Positive   >1:80    dsDNA Ab <1 0 - 9 IU/mL     Comment:                                    Negative      <5                                    Equivocal  5 - 9                                    Positive      >9    ENA RNP Ab 1.0 (H) 0.0 - 0.9 AI   ENA SM Ab Ser-aCnc <0.2 0.0 - 0.9 AI   Scleroderma (Scl-70) (ENA) Antibody, IgG <0.2 0.0 - 0.9 AI   ENA SSA (RO) Ab <0.2 0.0 - 0.9 AI   ENA SSB (LA) Ab <0.2 0.0 - 0.9 AI  FANA Staining Patterns     Status: None   Collection Time: 06/09/23  3:14 PM  Result Value Ref Range   Speckled Pattern 1:80     Comment: ICAP nomenclature: AC-2,4,5,29   Note: Comment     Comment: Pattern              Potential Disease Association -------------  --------------------------------------------- Homogeneous    Systemic Lupus Erythematosus, Drug Induced                Systemic Lupus Erythematosus, Chronic                Autoimmune hepatitis, Juvenile Idiopathic                Arthritis -------------  --------------------------------------------- Speckled       Sjogren Syndrome, Systemic Lupus                Erythematosus, Subacute Cutaneous Lupus,                Neonatal Lupus, Congenital Heart Block,                Mixed Connective Tissue Disease,                Scleroderma-diffuse, Scleroderma-Autoimmune                Myositis Overlap Syndrome, Systemic Lupus                Erythematosus-Scleroderma-Autoimmune                Myositis Overlap Syndrome, Systemic                Autoimmune Rheumatic Disease,                Undifferentiated Connective Tissue Disease -------------  --------------------------------------------- Nucleolar      Systemic [place] lerosis, Scleroderma-Autoimmune                Myositis Overlap Syndrome, Sjogren                Syndrome, Raynaud phenomenon, Pulmonary  Arterial Hypertension, Systemic Autoimmune                Rheumatic Disease, Cancer -------------  --------------------------------------------- Centromere     Scleroderma-CREST, Limited Cutaneous SSc,                 Raynaud's Phenomenon, Primary Biliary                Cholangitis -------------  --------------------------------------------- Nuclear Dot    Primary Biliary Cholangitis -------------  --------------------------------------------- Nuclear        Primary Biliary Cholangitis, Autoimmune Membrane       Hepatitis/Liver disease, Systemic Autoimmune                Rheumatic Disease, Autoimmune Cytopenias,                Linear Scleroderma, Antiphospholipid Syndrome -------------  ---------------------------------------------   Pulmonary Function Test     Status: None   Collection Time: 06/26/23  1:00 PM  Result Value Ref Range   FVC-Pre 3.24 L   FVC-%Pred-Pre 129 %   FVC-Post 3.35 L   FVC-%Pred-Post 134 %   FVC-%Change-Post 3 %   FEV1-Pre 2.46 L   FEV1-%Pred-Pre 132 %   FEV1-Post 2.64 L   FEV1-%Pred-Post 142 %   FEV1-%Change-Post 7 %   FEV6-Pre 3.21 L   FEV6-%Pred-Pre 136 %   FEV6-Post 3.35 L   FEV6-%Pred-Post 141 %   FEV6-%Change-Post 4 %   Pre FEV1/FVC ratio 76 %   FEV1FVC-%Pred-Pre 101 %   Post FEV1/FVC ratio 79 %   FEV1FVC-%Change-Post 3 %   Pre FEV6/FVC Ratio 99 %   FEV6FVC-%Pred-Pre 104 %   Post FEV6/FVC ratio 100 %   FEV6FVC-%Pred-Post 105 %   FEV6FVC-%Change-Post 0 %   FEF 25-75 Pre 1.88 L/sec   FEF2575-%Pred-Pre 130 %   FEF 25-75 Post 2.78 L/sec   FEF2575-%Pred-Post 192 %   FEF2575-%Change-Post 47 %   RV 2.09 L   RV % pred 93 %   TLC 5.41 L   TLC % pred 113 %   DLCO unc 10.25 ml/min/mmHg   DLCO unc % pred 57 %   DLCO cor 10.25 ml/min/mmHg   DLCO cor % pred 57 %   DL/VA 7.93 ml/min/mmHg/L   DL/VA % pred 49 %  ToxASSURE Select 13 (MW), Urine     Status: None   Collection Time: 07/08/23  7:47 AM  Result Value Ref Range   Summary FINAL     Comment: ==================================================================== ToxASSURE Select 13 (MW) ==================================================================== Test                              Result       Flag       Units  Drug Present and Declared for Prescription Verification   7-aminoclonazepam              607          EXPECTED   ng/mg creat    7-aminoclonazepam is an expected metabolite of clonazepam . Source of    clonazepam  is a scheduled prescription medication.  ==================================================================== Test                      Result    Flag   Units      Ref Range   Creatinine              107  mg/dL      >=79 ==================================================================== Declared Medications:  The flagging and interpretation on this report are based on the  following declared medications.  Unexpected results may arise from  inaccuracies in the declared medications.   **Note: The testing scope of this panel i ncludes these medications:   Clonazepam  (Klonopin )   **Note: The testing scope of this panel does not include the  following reported medications:   Buspirone  (Buspar )  Ciclopirox  (Loprox )  Levothyroxine  (Synthroid )  Sertraline  (Zoloft )  Simvastatin  (Zocor )  Supplement (PreserVision) ==================================================================== For clinical consultation, please call [phone]. ====================================================================          has a past medical history of Arthritis, Basal cell carcinoma, Breast lump (10/21/2011), Depression, Fracture of rib of right side (08/17/2013), Hypercholesterolemia, Hypothyroidism, Low blood pressure (10/25/2014), Panic disorder, Seizures (HCC), Shoulder pain, left, Squamous cell carcinoma of skin, Squamous cell carcinoma of skin (08/07/2022), Vertigo, and Vitamin D  deficiency.   reports that she has quit smoking. Her smoking use included cigarettes. She quit smokeless tobacco use about 10 years ago.  Past Surgical History:  Procedure Laterality Date   ABDOMINAL HYSTERECTOMY  1981   partial - secondary to bleeding,  ovaries not removed -  vaginal   CATARACT EXTRACTION W/ INTRAOCULAR LENS  IMPLANT, BILATERAL     COLONOSCOPY WITH PROPOFOL  N/A 05/05/2015   Procedure: COLONOSCOPY WITH PROPOFOL ;  Surgeon: Rogelia Copping, MD;  Location: [facility];  Service: Endoscopy;  Laterality: N/A;   COLONOSCOPY WITH PROPOFOL  N/A 06/26/2021   Procedure: COLONOSCOPY WITH PROPOFOL ;  Surgeon: Therisa Bi, MD;  Location: [facility];  Service: Gastroenterology;  Laterality: N/A;   MASS EXCISION Right 12/24/2017   Procedure: EXCISION MASS ( SHIN );  Surgeon: Jama Cordella MATSU, MD;  Location: [facility];  Service: Vascular;  Laterality: Right;   ORIF FIBULA FRACTURE Right 2003   right lower extremitiy   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   shoulder Left    bone suprs   SHOULDER SURGERY  12/2015   TONSILLECTOMY  1974   TOTAL HIP ARTHROPLASTY Right 03/2023    Allergies  Allergen Reactions   Duloxetine Hcl Other (See Comments)    depression worsened   Ezetimibe-Simvastatin  Other (See Comments)    Joint pain    Immunization History  Administered Date(s) Administered   Influenza Split 06/28/2013, 05/27/2014, 04/21/2017   Influenza, High Dose Seasonal PF 05/19/2018, 02/25/2019, 03/12/2021   Influenza-Unspecified 05/25/2015, 05/24/2017, 03/13/2020, 05/29/2022   PFIZER(Purple Top)SARS-COV-2 Vaccination 08/06/2019, 08/31/2019, 03/13/2020, 11/30/2020, 03/12/2021   Pfizer Covid-19 Vaccine Bivalent Booster 56yrs & up 07/09/2021   Pneumococcal Conjugate-13 08/03/2013   Pneumococcal Polysaccharide-23 09/14/2012, 07/11/2015   Respiratory Syncytial Virus Vaccine,Recomb Aduvanted(Arexvy) 05/29/2022   Tdap 02/25/2019   Unspecified SARS-COV-2 Vaccination 03/13/2020   Zoster Recombinant(Shingrix) 07/09/2022, 10/17/2022   Zoster, Live 06/24/2008    Family History  Problem Relation Age of Onset   Arthritis Mother    Hyperlipidemia Mother    Arthritis Father    Prostate cancer Father    Breast cancer Sister 65    Heart disease Brother        heart attack   Diabetes Brother    Breast cancer Maternal Aunt 85   Colon cancer Maternal Uncle      Current Outpatient Medications:    busPIRone  (BUSPAR ) 7.5 MG tablet, Take 1 tablet (7.5 mg total) by mouth 2 (two) times daily., Disp: 180 tablet, Rfl: 2   ciclopirox  (PENLAC ) 8 % solution, Apply topically at bedtime. Apply over nail and surrounding skin. Apply  daily over previous coat. After seven (7) days, may remove with alcohol and continue cycle. Apply for 4 weeks, Disp: 6.6 mL, Rfl: 0   clonazePAM  (KLONOPIN ) 1 MG tablet, Take 1 tablet (1 mg total) by mouth 2 (two) times daily as needed for anxiety., Disp: 60 tablet, Rfl: 0   levothyroxine  (SYNTHROID ) 125 MCG tablet, TAKE 1 TABLET BY MOUTH DAILY BEFORE BREAKFAST, Disp: 90 tablet, Rfl: 1   Multiple Vitamins-Minerals (PRESERVISION AREDS 2 PO), Take 1 capsule by mouth 2 (two) times daily. , Disp: , Rfl:    Omega 3 1000 MG CAPS, Take by mouth daily., Disp: , Rfl:    sertraline  (ZOLOFT ) 100 MG tablet, TAKE 2 TABLETS BY MOUTH DAILY, Disp: 180 tablet, Rfl: 1   simvastatin  (ZOCOR ) 20 MG tablet, Take 1 tablet (20 mg total) by mouth every evening., Disp: 90 tablet, Rfl: 1      Objective:   Vitals:   08/01/23 1040  BP: 97/66  Pulse: 81  SpO2: 97%  Weight: 123 lb (55.8 kg)  Height: 5' 3 (1.6 m)    Estimated body mass index is 21.79 kg/m as calculated from the following:   Height as of this encounter: 5' 3 (1.6 m).   Weight as of this encounter: 123 lb (55.8 kg).  @WEIGHTCHANGE @  [employer]   08/01/23 1040  Weight: 123 lb (55.8 kg)     Physical Exam   General: No distress. Looks well O2 at rest: no Cane present: no Sitting in wheel chair: no Frail: no Obese: o Neuro: Alert and Oriented x 3. GCS 15. Speech normal Psych: Pleasant Resp:  Barrel Chest - no.  Wheeze - no, Crackles - no, No overt respiratory distress CVS: Normal heart sounds. Murmurs - no Ext: Stigmata of Connective Tissue  Disease - no HEENT: Normal upper airway. PEERL +. No post nasal drip        Assessment:       ICD-10-CM   1. Abnormal CT lung screening  R91.8 Pulmonary function test    CT Chest High Resolution    2. Former smoker  Z87.891 Pulmonary function test    CT Chest High Resolution    3. Antibody to extractable nuclear antigen (ENA) positive (HCC)  D89.89 Pulmonary function test    CT Chest High Resolution    4. ANA positive  R76.8 Pulmonary function test    CT Chest High Resolution    5. Raynaud's phenomenon without gangrene  I73.00 Pulmonary function test    CT Chest High Resolution         Plan:     Patient Instructions     ICD-10-CM   1. Abnormal CT lung screening  R91.8     2. Former smoker  Z87.891     3. 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 ANA positive  R76.8     5. Raynaud's phenomenon without gangrene  I73.00      Low-dose CT scan August 2024 shows likely interstitial lung abnormalities versus atelectasis.  Whether this is clinically significant is unclear.  The autoimmune panel is also trace positive and again do not know clinical significance.  At this point in time you are stable.  The diffusion test shows a slight reduction which could be technical variation but could reflect significance of the interstitial abnormalities.    Plan -- Given the minimal symptoms state I would recommend continued monitoring at this stage  -Do high-resolution CT chest in  5-6 months [cancel lung cancer screening in August 2025]  -Do spirometry and DLCO in 5-6 months  - return earlire if getting worse  Follow-up - Return in August 2025 15-minute visit but after testing.   FOLLOWUP Return in about 6 months (around 01/29/2024) for 15 min visit, after Spiro and DLCO, after HRCT chest, with Dr Geronimo, Face to Face Visit.    SIGNATURE    Dr. Dorethia Geronimo, M.D., F.C.C.P,  Pulmonary and Critical Care Medicine Staff Physician,  [facility] Director - Interstitial Lung Disease  Program  Pulmonary Fibrosis [facility] at [facility] [place], [place], [address]  Pager: [phone], If no answer or between  15:00h - 7:00h: call [phone] Telephone: [phone]  3:11 PM 08/01/2023

## 2023-08-07 ENCOUNTER — Other Ambulatory Visit: Payer: Self-pay | Admitting: Family Medicine

## 2023-08-07 DIAGNOSIS — F39 Unspecified mood [affective] disorder: Secondary | ICD-10-CM

## 2023-08-07 NOTE — Telephone Encounter (Signed)
Refilled: 07/07/2023 Last OV: 07/07/2023 Next OV: not scheduled

## 2023-08-10 ENCOUNTER — Ambulatory Visit: Payer: Medicare Other

## 2023-08-14 ENCOUNTER — Ambulatory Visit: Payer: Self-pay | Admitting: Family Medicine

## 2023-08-14 ENCOUNTER — Encounter: Payer: Self-pay | Admitting: *Deleted

## 2023-08-14 NOTE — Telephone Encounter (Signed)
Copied from CRM [id number]. Topic: Clinical - Medication Refill >> Aug 14, 2023  2:26 PM Corin V wrote: Most Recent Primary Care Visit:  Provider: LAWS, REGINA C  Department: [facility]  Visit Type: MEDICARE AWV, SEQUENTIAL  Date: 07/14/2023  Medication: gabapentin (NEURONTIN) 300 MG capsule  Has the patient contacted their pharmacy? No (Agent: If no, request that the patient contact the pharmacy for the refill. If patient does not wish to contact the pharmacy document the reason why and proceed with request.) (Agent: If yes, when and what did the pharmacy advise?)  Is this the correct pharmacy for this prescription? Yes If no, delete pharmacy and type the correct one.  This is the patient's preferred pharmacy:  [facility] [id number] [place], [place] - [address] [place] [address] Phone: [phone] Fax: [phone]   Has the prescription been filled recently? No  Is the patient out of the medication? No  Has the patient been seen for an appointment in the last year OR does the patient have an upcoming appointment? Yes  Can we respond through MyChart? No  Agent: Please be advised that Rx refills may take up to 3 business days. We ask that you follow-up with your pharmacy.

## 2023-08-21 ENCOUNTER — Other Ambulatory Visit: Payer: Self-pay | Admitting: Family Medicine

## 2023-08-21 ENCOUNTER — Ambulatory Visit: Payer: Medicare Other | Admitting: Nurse Practitioner

## 2023-08-21 ENCOUNTER — Telehealth: Payer: Medicare Other | Admitting: Nurse Practitioner

## 2023-08-21 VITALS — Ht 63.0 in | Wt 123.0 lb

## 2023-08-21 DIAGNOSIS — F32A Depression, unspecified: Secondary | ICD-10-CM

## 2023-08-21 DIAGNOSIS — F419 Anxiety disorder, unspecified: Secondary | ICD-10-CM | POA: Diagnosis not present

## 2023-08-21 DIAGNOSIS — G4700 Insomnia, unspecified: Secondary | ICD-10-CM | POA: Diagnosis not present

## 2023-08-21 DIAGNOSIS — E039 Hypothyroidism, unspecified: Secondary | ICD-10-CM

## 2023-08-21 DIAGNOSIS — E7800 Pure hypercholesterolemia, unspecified: Secondary | ICD-10-CM

## 2023-08-21 MED ORDER — HYDROXYZINE PAMOATE 25 MG PO CAPS: 25.0000 mg | ORAL_CAPSULE | Freq: Every day | ORAL | 0 refills | Status: DC

## 2023-08-21 NOTE — Progress Notes (Signed)
 Virtual Visit via Video Note  I connected with Pam Salazar on 08/21/2023 at 1:16 PM by a video enabled telemedicine application and verified that I am speaking with the correct person using two identifiers.  Patient Location: Home Provider Location: Office/Clinic  I discussed the limitations, risks, security, and privacy concerns of performing an evaluation and management service by video and the availability of in person appointments. I also discussed with the patient that there may be a patient responsible charge related to this service. The patient expressed understanding and agreed to proceed.  Subjective: PCP: Dana Allan, MD  Chief Complaint  Patient presents with   Acute Visit    Insomnia, anxiety & depression   Discussed the use of a AI scribe software for clinical note transcription with the patient, who gave verbal consent to proceed.  HPI Pam Salazar is a 78 year old female who presents with insomnia and itching.  She experiences significant insomnia, managing only about thirty minutes of sleep last night and typically not more than four hours on a good night. Her sleep disturbances have worsened since October 10th, coinciding with her daughter's diagnosis of acute myeloid leukemia. Her daughter's diagnosis  has been a significant source of stress and anxiety for her.  No daytime napping as she believes it would worsen nighttime sleep. She has been taking clonazepam for 32 years, with a dose of one in the morning and one at night, but it has been ineffective for her sleep.   Gabapentin was previously prescribed and made her drowsy, but she discontinued   ROS: Per HPI  Current Outpatient Medications:    busPIRone (BUSPAR) 7.5 MG tablet, Take 1 tablet (7.5 mg total) by mouth 2 (two) times daily., Disp: 180 tablet, Rfl: 2   ciclopirox (PENLAC) 8 % solution, Apply topically at bedtime. Apply over nail and surrounding skin. Apply daily over previous coat.  After seven (7) days, may remove with alcohol and continue cycle. Apply for 4 weeks, Disp: 6.6 mL, Rfl: 0   clonazePAM (KLONOPIN) 1 MG tablet, TAKE ONE TABLET BY MOUTH TWICE A DAY AS NEEDED FOR ANXIETY, Disp: 60 tablet, Rfl: 0   hydrOXYzine (VISTARIL) 25 MG capsule, Take 1 capsule (25 mg total) by mouth at bedtime., Disp: 30 capsule, Rfl: 0   Multiple Vitamins-Minerals (PRESERVISION AREDS 2 PO), Take 1 capsule by mouth 2 (two) times daily. , Disp: , Rfl:    Omega 3 1000 MG CAPS, Take by mouth daily., Disp: , Rfl:    sertraline (ZOLOFT) 100 MG tablet, TAKE 2 TABLETS BY MOUTH DAILY, Disp: 180 tablet, Rfl: 1   levothyroxine (SYNTHROID) 125 MCG tablet, TAKE 1 TABLET BY MOUTH DAILY BEFORE BREAKFAST, Disp: 90 tablet, Rfl: 1   simvastatin (ZOCOR) 20 MG tablet, TAKE ONE TABLET BY MOUTH EVERY EVENING, Disp: 90 tablet, Rfl: 1  Observations/Objective: Today's Vitals   08/21/23 1321  Weight: 123 lb (55.8 kg)  Height: 5\' 3"  (1.6 m)   Physical Exam Constitutional:      General: She is not in acute distress.    Appearance: Normal appearance. She is not ill-appearing.  Eyes:     Conjunctiva/sclera: Conjunctivae normal.  Pulmonary:     Effort: No respiratory distress.  Neurological:     Mental Status: She is alert.  Psychiatric:        Mood and Affect: Mood normal.        Behavior: Behavior normal.        Thought Content: Thought content normal.  Judgment: Judgment normal.     Assessment and Plan: Insomnia, unspecified type Assessment & Plan: Chronic sleep disturbances exacerbated by stress related to daughter's acute myeloid leukemia diagnosis. Currently on Clonazepam with no effect on sleep.  -Increase Hydroxyzine from 10mg  to 25mg , to be taken half an hour before bedtime. -Consider Gabapentin if Hydroxyzine is ineffective after trial.    Anxiety and depression Assessment & Plan: Chronic issue. Increased due her daughter's diagnosis of acute myeloid leukemia. - She will continue  Zoloft 100 mg daily, clonazepam 1 mg twice daily, and buspirone 7.5 mg twice daily. -Patient declined referral to CBT at present and will let us know if she would like to see the therapist or psychiatrist   Other orders -     hydrOXYzine Pamoate; Take 1 capsule (25 mg total) by mouth at bedtime.  Dispense: 30 capsule; Refill: 0    Follow Up Instructions: No follow-ups on file.   I discussed the assessment and treatment plan with the patient. The patient was provided an opportunity to ask questions, and all were answered. The patient agreed with the plan and demonstrated an understanding of the instructions.   The patient was advised to call back or seek an in-person evaluation if the symptoms worsen or if the condition fails to improve as anticipated.  The above assessment and management plan was discussed with the patient. The patient verbalized understanding of and has agreed to the management plan.   Kara Dies, NP

## 2023-08-30 ENCOUNTER — Encounter: Payer: Self-pay | Admitting: Nurse Practitioner

## 2023-09-01 DIAGNOSIS — M1611 Unilateral primary osteoarthritis, right hip: Secondary | ICD-10-CM | POA: Diagnosis not present

## 2023-09-03 NOTE — Assessment & Plan Note (Addendum)
 Chronic issue. Increased due her daughter's diagnosis of acute myeloid leukemia. - She will continue Zoloft 100 mg daily, clonazepam 1 mg twice daily, and buspirone 7.5 mg twice daily. -Patient declined referral to CBT at present and will let us know if she would like to see the therapist or psychiatrist

## 2023-09-03 NOTE — Assessment & Plan Note (Signed)
 Chronic sleep disturbances exacerbated by stress related to daughter's acute myeloid leukemia diagnosis. Currently on Clonazepam with no effect on sleep.  -Increase Hydroxyzine from 10mg  to 25mg , to be taken half an hour before bedtime. -Consider Gabapentin if Hydroxyzine is ineffective after trial.

## 2023-09-04 ENCOUNTER — Telehealth: Payer: Self-pay | Admitting: Internal Medicine

## 2023-09-04 NOTE — Telephone Encounter (Signed)
 Patient would like to schedule CT scan. Would like at [facility]. Patient phone number is [phone].

## 2023-09-05 ENCOUNTER — Other Ambulatory Visit: Payer: Self-pay | Admitting: Family Medicine

## 2023-09-05 DIAGNOSIS — F39 Unspecified mood [affective] disorder: Secondary | ICD-10-CM

## 2023-09-05 NOTE — Telephone Encounter (Signed)
 Pam Salazar - please schedule this CT ordered on 2/7 by Dr. Marchelle Gearing,  thanks!

## 2023-09-05 NOTE — Telephone Encounter (Signed)
 LVM informing pt of CT scan at [facility]. NFN

## 2023-09-12 ENCOUNTER — Ambulatory Visit: Admission: RE | Admit: 2023-09-12 | Source: Ambulatory Visit

## 2023-09-15 ENCOUNTER — Ambulatory Visit: Payer: Medicare Other | Admitting: Dermatology

## 2023-09-20 ENCOUNTER — Ambulatory Visit: Admission: EM | Admit: 2023-09-20 | Discharge: 2023-09-20 | Disposition: A | Discharge status: Home / Self Care

## 2023-09-20 ENCOUNTER — Encounter: Payer: Self-pay | Admitting: Emergency Medicine

## 2023-09-20 DIAGNOSIS — S022XXA Fracture of nasal bones, initial encounter for closed fracture: Secondary | ICD-10-CM | POA: Diagnosis not present

## 2023-09-20 DIAGNOSIS — S0992XA Unspecified injury of nose, initial encounter: Secondary | ICD-10-CM | POA: Diagnosis not present

## 2023-09-20 DIAGNOSIS — S0990XA Unspecified injury of head, initial encounter: Secondary | ICD-10-CM

## 2023-09-20 DIAGNOSIS — S0121XA Laceration without foreign body of nose, initial encounter: Secondary | ICD-10-CM | POA: Diagnosis not present

## 2023-09-20 DIAGNOSIS — W19XXXA Unspecified fall, initial encounter: Secondary | ICD-10-CM | POA: Diagnosis not present

## 2023-09-20 DIAGNOSIS — S0993XA Unspecified injury of face, initial encounter: Secondary | ICD-10-CM | POA: Diagnosis not present

## 2023-09-20 DIAGNOSIS — Z043 Encounter for examination and observation following other accident: Secondary | ICD-10-CM | POA: Diagnosis not present

## 2023-09-20 DIAGNOSIS — F32A Depression, unspecified: Secondary | ICD-10-CM | POA: Diagnosis not present

## 2023-09-20 DIAGNOSIS — Z888 Allergy status to other drugs, medicaments and biological substances status: Secondary | ICD-10-CM | POA: Diagnosis not present

## 2023-09-20 DIAGNOSIS — E079 Disorder of thyroid, unspecified: Secondary | ICD-10-CM | POA: Diagnosis not present

## 2023-09-20 DIAGNOSIS — E039 Hypothyroidism, unspecified: Secondary | ICD-10-CM | POA: Diagnosis not present

## 2023-09-20 NOTE — ED Triage Notes (Signed)
 Patient states that she tripped and fell at her daughter's house around 11 am this morning.  Patient states she face planted and hit her face on the dirt ground.  Patient c/o left knee pain and headache.  Patient has laceration, swelling and bruising to her nose.  Patient denies LOC.

## 2023-09-20 NOTE — ED Provider Notes (Signed)
 [facility]    CSN: [id number] Arrival date & time: 09/20/23  1327      History   Chief Complaint Chief Complaint  Patient presents with   Fall   Knee Pain    left    HPI Pam Salazar is a 78 y.o. female with history of hip osteoarthritis, balance disorder, hyperlipidemia, insomnia, anxiety, depression, and positional vertigo.  Patient presents today for injuries that occurred about 3 hours ago.  Patient says that she was at her daughter's house this morning, tripped and fell.  States that she face planted and hit her face on the dirt ground.  She is complaining of nasal pain, left knee pain and a headache.  There is a laceration and contusion/swelling of the nose.  She says she did suffer a nosebleed after this accident occurred.  Patient denies loss of consciousness, dizziness, vomiting.  No numbness/tingling or weakness.  No speech or balance problems.  Able to walk and bear weight but says something does not feel right in the left knee.  Patient says wounds have been cleaned by her daughter.  She thinks she may need some imaging done today.  HPI  Past Medical History:  Diagnosis Date   Arthritis    hips, lower back   Basal cell carcinoma    Breast lump 10/21/2011   Overview:  Negative diagnostic mammogram 09/2011    Depression    Fracture of rib of right side 08/17/2013   Hypercholesterolemia    Hypothyroidism    Low blood pressure 10/25/2014   Panic disorder    previous agoraphobia   Seizures (HCC)    febrile - as infant   Shoulder pain, left    bursa   Squamous cell carcinoma of skin    Squamous cell carcinoma of skin 08/07/2022   L lateral lower leg, EDC sched 09/04/22 at 11:45 AM   Vertigo    positional - rare   Vitamin D deficiency     Patient Active Problem List   Diagnosis Date Noted   Onychomycosis 07/15/2023   Controlled substance agreement signed 07/15/2023   Mood disorder (HCC) 07/15/2023   Osteoarthritis of right hip 03/31/2023    History of cigarette smoking 01/31/2023   Left forearm pain 12/25/2022   Tendinitis 11/18/2022   Viral respiratory illness 10/21/2022   Constipation 09/24/2022   Dysphagia 09/24/2022   Chronic cough 01/28/2022   Allergic rhinitis 12/24/2021   Balance disorder 06/05/2020   Arthritis 01/15/2019   Menopausal vaginal dryness 05/09/2017   Dyspareunia in female 05/09/2017   Angiodysplasia of intestine with hemorrhage    Idiopathic colitis    Insomnia 09/26/2014   Anxiety and depression 03/07/2013   Hypercholesterolemia 03/07/2013   Hypothyroidism 03/07/2013   Skin cancer 03/07/2013   Nicotine dependence 06/10/2012    Past Surgical History:  Procedure Laterality Date   ABDOMINAL HYSTERECTOMY  1981   partial - secondary to bleeding, ovaries not removed -  vaginal   CATARACT EXTRACTION W/ INTRAOCULAR LENS  IMPLANT, BILATERAL     COLONOSCOPY WITH PROPOFOL N/A 05/05/2015   Procedure: COLONOSCOPY WITH PROPOFOL;  Surgeon: Midge Minium, MD;  Location: [facility];  Service: Endoscopy;  Laterality: N/A;   COLONOSCOPY WITH PROPOFOL N/A 06/26/2021   Procedure: COLONOSCOPY WITH PROPOFOL;  Surgeon: Wyline Mood, MD;  Location: [facility];  Service: Gastroenterology;  Laterality: N/A;   MASS EXCISION Right 12/24/2017   Procedure: EXCISION MASS ( SHIN );  Surgeon: Renford Dills, MD;  Location: [facility];  Service: Vascular;  Laterality: Right;   ORIF FIBULA FRACTURE Right 2003   right lower extremitiy   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   shoulder Left    bone suprs   SHOULDER SURGERY  12/2015   TONSILLECTOMY  1974   TOTAL HIP ARTHROPLASTY Right 03/2023    OB History     Gravida  3   Para  3   Term  2   Preterm  1   AB  0   Living  2      SAB  0   IAB      Ectopic      Multiple      Live Births  2            Home Medications    Prior to Admission medications   Medication Sig Start Date End Date Taking? Authorizing Provider  busPIRone  (BUSPAR) 7.5 MG tablet Take 1 tablet (7.5 mg total) by mouth 2 (two) times daily. 12/25/22  Yes Glori Luis, MD  clonazePAM (KLONOPIN) 1 MG tablet TAKE 1 TABLET BY MOUTH 2 TIMES A DAY AS NEEDED FOR ANXIETY Patient taking differently: Take 1 mg by mouth 2 (two) times daily. 09/05/23  Yes Dana Allan, MD  levothyroxine (SYNTHROID) 125 MCG tablet TAKE 1 TABLET BY MOUTH DAILY BEFORE BREAKFAST 08/25/23  Yes Dana Allan, MD  Omega 3 1000 MG CAPS Take by mouth daily.   Yes [provider]  sertraline (ZOLOFT) 100 MG tablet TAKE 2 TABLETS BY MOUTH DAILY 04/15/23  Yes Glori Luis, MD  simvastatin (ZOCOR) 20 MG tablet TAKE ONE TABLET BY MOUTH EVERY EVENING 08/25/23  Yes Dana Allan, MD  ciclopirox [facility]) 8 % solution Apply topically at bedtime. Apply over nail and surrounding skin. Apply daily over previous coat. After seven (7) days, may remove with alcohol and continue cycle. Apply for 4 weeks 07/14/23   Dana Allan, MD  hydrOXYzine (VISTARIL) 25 MG capsule Take 1 capsule (25 mg total) by mouth at bedtime. 08/21/23   Kara Dies, NP  Multiple Vitamins-Minerals (PRESERVISION AREDS 2 PO) Take 1 capsule by mouth 2 (two) times daily.     [provider]    Family History Family History  Problem Relation Age of Onset   Arthritis Mother    Hyperlipidemia Mother    Arthritis Father    Prostate cancer Father    Breast cancer Sister 32   Heart disease Brother        heart attack   Diabetes Brother    Breast cancer Maternal Aunt 84   Colon cancer Maternal Uncle     Social History Social History   Tobacco Use   Smoking status: Former    Types: Cigarettes   Smokeless tobacco: Former    Quit date: 07/26/2013   Tobacco comments:       Vaping Use   Vaping status: Never Used  Substance Use Topics   Alcohol use: No    Alcohol/week: 0.0 standard drinks of alcohol   Drug use: No     Allergies   Duloxetine hcl and Ezetimibe-simvastatin   Review of  Systems Review of Systems  Constitutional:  Negative for fatigue.  Eyes:  Negative for photophobia and visual disturbance.  Respiratory:  Negative for shortness of breath.   Cardiovascular:  Negative for chest pain.  Gastrointestinal:  Negative for nausea and vomiting.  Musculoskeletal:  Positive for arthralgias and joint swelling. Negative for back pain, gait problem and neck pain.  Skin:  Positive for color change and wound.  Neurological:  Positive for headaches. Negative for dizziness, syncope, facial asymmetry, speech difficulty, weakness and numbness.  Hematological:  Does not bruise/bleed easily.  Psychiatric/Behavioral:  Negative for confusion.      Physical Exam Triage Vital Signs ED Triage Vitals  Encounter Vitals Group     BP      Systolic BP Percentile      Diastolic BP Percentile      Pulse      Resp      Temp      Temp src      SpO2      Weight      Height      Head Circumference      Peak Flow      Pain Score      Pain Loc      Pain Education      Exclude from Growth Chart    No data found.  Updated Vital Signs BP 111/69 (BP Location: Left Arm)   Pulse 75   Temp 97.8 F (36.6 C) (Oral)   Resp 14   Ht 5\' 3"  (1.6 m)   Wt 123 lb 0.3 oz (55.8 kg)   SpO2 97%   BMI 21.79 kg/m      Physical Exam Vitals and nursing note reviewed.  Constitutional:      General: She is not in acute distress.    Appearance: Normal appearance. She is not ill-appearing or toxic-appearing.  HENT:     Right Ear: Tympanic membrane, ear canal and external ear normal.     Left Ear: Tympanic membrane, ear canal and external ear normal.     Nose: Laceration (somewhat jagged laceration bridge of nose) and nasal tenderness present.     Right Nostril: No epistaxis.     Left Nostril: No epistaxis.     Comments: Slightly crooked septum. Contusion and swelling of nose.    Mouth/Throat:     Mouth: Mucous membranes are moist.     Pharynx: Oropharynx is clear.  Eyes:     General:  No scleral icterus.       Right eye: No discharge.        Left eye: No discharge.     Extraocular Movements: Extraocular movements intact.     Conjunctiva/sclera: Conjunctivae normal.     Pupils: Pupils are equal, round, and reactive to light.  Cardiovascular:     Rate and Rhythm: Normal rate and regular rhythm.     Heart sounds: Normal heart sounds.  Pulmonary:     Effort: Pulmonary effort is normal. No respiratory distress.     Breath sounds: Normal breath sounds.  Musculoskeletal:     Cervical back: Neck supple.  Skin:    General: Skin is dry.  Neurological:     General: No focal deficit present.     Mental Status: She is alert and oriented to person, place, and time. Mental status is at baseline.     Cranial Nerves: No cranial nerve deficit.     Motor: No weakness.     Coordination: Coordination normal.     Gait: Gait abnormal (slight limp).  Psychiatric:        Mood and Affect: Mood normal.        Behavior: Behavior normal.      UC Treatments / Results  Labs (all labs ordered are listed, but only abnormal results are displayed) Labs Reviewed - No data to display  EKG  Radiology No results found.  Procedures Procedures (including critical care time)  Medications Ordered in UC Medications - No data to display  Initial Impression / Assessment and Plan / UC Course  I have reviewed the triage vital signs and the nursing notes.  Pertinent labs & imaging results that were available during my care of the patient were reviewed by me and considered in my medical decision making (see chart for details).   78 year old female presents for injuries that occurred a couple of hours ago when she excellently fell at her daughter's house and hit her face and head on the ground.  Denies loss of conscious.  Experiencing a lot of nasal pain, contusion, laceration, nosebleed, headaches and left knee pain/swelling.  Vitals are all stable.  Patient overall well-appearing.  No acute  distress.  She does have contusion and swelling of the nose with slight septal deviation.  No active nosebleed.  Does have jagged laceration of nose with minimal bleeding.  Normal cranial nerve exam.  Alert and oriented x 3.  Explained to patient that I am concerned with her age, complaint of headache, and traumatic fall.  Explained that I would add biased CT scan.  I also believe she needs a CT scan of her nose to evaluate for fracture and explained that if she does have a broken nose it may need to be reset.  Explained that she would be best served and it would be safest for her to be seen in the ED at this time.  Her husband brought her to the urgent care and she says he can take her to [facility] ED [place] at this time.  She is leaving in stable condition and will proceed to [facility] ED [facility] for higher level of care.   Final Clinical Impressions(s) / UC Diagnoses   Final diagnoses:  Injury of head, initial encounter  Laceration of nose, initial encounter  Injury of nose, initial encounter  Accidental fall, initial encounter     Discharge Instructions      You have been advised to follow up immediately in the emergency department for concerning signs.symptoms. If you declined EMS transport, please have a family member take you directly to the ED at this time. Do not delay. Based on concerns about condition, if you do not follow up in th e ED, you may risk poor outcomes including worsening of condition, delayed treatment and potentially life threatening issues. If you have declined to go to the ED at this time, you should call your PCP immediately to set up a follow up appointment.  Go to ED for red flag symptoms, including; fevers you cannot reduce with Tylenol/Motrin, severe headaches, vision changes, numbness/weakness in part of the body, lethargy, confusion, intractable vomiting, severe dehydration, chest pain, breathing difficulty, severe persistent abdominal or pelvic pain, signs of severe  infection (increased redness, swelling of an area), feeling faint or passing out, dizziness, etc. You should especially go to the ED for sudden acute worsening of condition if you do not elect to go at this time.      ED Prescriptions   None    I have reviewed the PDMP during this encounter.   Shirlee Latch, PA-C 09/20/23 1408

## 2023-09-20 NOTE — ED Notes (Signed)
 Patient is being discharged from the Urgent Care and sent to the [facility] Emergency Department via private vehicle with spouse . Per Eusebio Friendly, PA, patient is in need of higher level of care due to Fall, Head Injury and possible Nose fracture. Patient is aware and verbalizes understanding of plan of care.  Vitals:   09/20/23 1350  BP: 111/69  Pulse: 75  Resp: 14  Temp: 97.8 F (36.6 C)  SpO2: 97%

## 2023-09-20 NOTE — Discharge Instructions (Signed)

## 2023-09-23 ENCOUNTER — Ambulatory Visit: Admitting: Dermatology

## 2023-09-23 ENCOUNTER — Ambulatory Visit: Payer: Self-pay

## 2023-09-23 NOTE — Telephone Encounter (Signed)
 Chief Complaint: vertigo Symptoms: dizziness, unbalance, lightheaded Frequency: ongoing for months Pertinent Negatives: Patient denies feeling faint Disposition: [] ED /[] Urgent Care (no appt availability in office) / [x] Appointment(In office/virtual)/ []  [place] [facility]/ [] Home Care/ [] Refused Recommended Disposition /[] [place] [facility]/ []  Follow-up with PCP Additional Notes: Patient states that she is and an increase in her vertigo symptoms. States that she had a fall on Saturday. States that the room spins when she lays down and she noticed her balance is off more. States that she has to hold on to things when changing position to keep from falling.   Copied from CRM [id number]. Topic: Clinical - Red Word Triage >> Sep 23, 2023  9:19 AM [place] E wrote: [facility] that prompted transfer to Nurse Triage: Patient experiencing vertigo symptoms. States her balance is getting worse and has to hold onto the bed in order to stand up and gain balance. Patient also fell on 3/29 regarding these vertigo symptoms. Reason for Disposition  [1] MODERATE dizziness (e.g., vertigo; feels very unsteady, interferes with normal activities) AND [2] has been evaluated by doctor (or NP/PA) for this  Answer Assessment - Initial Assessment Questions 1. DESCRIPTION: "Describe your dizziness."     dizziness 2. VERTIGO: "Do you feel like either you or the room is spinning or tilting?"      Yes when laying down 3. LIGHTHEADED: "Do you feel lightheaded?" (e.g., somewhat faint, woozy, weak upon standing)     Light headed 4. SEVERITY: "How bad is it?"  "Can you walk?"   - MILD: Feels slightly dizzy and unsteady, but is walking normally.   - MODERATE: Feels unsteady when walking, but not falling; interferes with normal activities (e.g., school, work).   - SEVERE: Unable to walk without falling, or requires assistance to walk without falling.     severe 5. ONSET:  "When did the dizziness begin?"      ongoing 6. AGGRAVATING FACTORS: "Does anything make it worse?" (e.g., standing, change in head position)     Positional changes, standing 7. CAUSE: "What do you think is causing the dizziness?"     vertigo 8. RECURRENT SYMPTOM: "Have you had dizziness before?" If Yes, ask: "When was the last time?" "What happened that time?"     Saturday 9. OTHER SYMPTOMS: "Do you have any other symptoms?" (e.g., headache, weakness, numbness, vomiting, earache)     Nausea at times  Protocols used: Dizziness - Vertigo-A-AH

## 2023-09-23 NOTE — Telephone Encounter (Signed)
 Left message to return call to our office.

## 2023-09-23 NOTE — Telephone Encounter (Signed)
 Ok to relay Dr. Precious Haws to pt.

## 2023-09-24 ENCOUNTER — Encounter: Payer: Self-pay | Admitting: Family Medicine

## 2023-09-24 ENCOUNTER — Ambulatory Visit
Admission: RE | Admit: 2023-09-24 | Discharge: 2023-09-24 | Disposition: A | Discharge status: Home / Self Care | Source: Ambulatory Visit | Attending: Family Medicine | Admitting: Family Medicine

## 2023-09-24 ENCOUNTER — Ambulatory Visit (INDEPENDENT_AMBULATORY_CARE_PROVIDER_SITE_OTHER): Admitting: Family Medicine

## 2023-09-24 VITALS — BP 93/67 | HR 89 | Temp 97.6°F | Resp 20 | Ht 63.0 in | Wt 124.5 lb

## 2023-09-24 DIAGNOSIS — K5900 Constipation, unspecified: Secondary | ICD-10-CM

## 2023-09-24 DIAGNOSIS — R42 Dizziness and giddiness: Secondary | ICD-10-CM | POA: Diagnosis not present

## 2023-09-24 DIAGNOSIS — S0990XA Unspecified injury of head, initial encounter: Secondary | ICD-10-CM | POA: Insufficient documentation

## 2023-09-24 DIAGNOSIS — F419 Anxiety disorder, unspecified: Secondary | ICD-10-CM

## 2023-09-24 DIAGNOSIS — F32A Depression, unspecified: Secondary | ICD-10-CM | POA: Diagnosis not present

## 2023-09-24 MED ORDER — MECLIZINE HCL 12.5 MG PO TABS
12.5000 mg | ORAL_TABLET | Freq: Three times a day (TID) | ORAL | 0 refills | Status: DC | PRN
Start: 2023-09-24 — End: 2023-11-11

## 2023-09-24 MED ORDER — BUSPIRONE HCL 7.5 MG PO TABS: 7.5000 mg | ORAL_TABLET | Freq: Three times a day (TID) | ORAL | 2 refills | Status: DC

## 2023-09-24 NOTE — Progress Notes (Signed)
 SUBJECTIVE:   Chief Complaint  Patient presents with   Dizziness    Not getting in better face planted on the ground   HPI Presents for acute visit  Discussed the use of AI scribe software for clinical note transcription with the patient, who gave verbal consent to proceed.  History of Present Illness Pam Salazar is a 78 year old female who presents with worsening dizziness and recent head trauma.  She has been experiencing worsening dizziness and vertigo, which has been a chronic issue but has recently intensified following a fall on Saturday. The dizziness is described as a spinning sensation, particularly with positional changes, and her balance has been poor, requiring her to grab furniture for support. Previously, lying down would alleviate the vertigo, but this is no longer effective. No vomiting, blurry vision, double vision, numbness, or tingling. Nausea was present immediately after the fall. Her gait has not changed since the fall, although it remains poor.  On Saturday, she experienced a fall resulting in mild head trauma and facial injuries, including nose fractures. She faceplanted while getting out of a car, leading to epistaxis. A CT scan showed minimally displaced fractures of two nasal bones. She has not experienced any worsening of headaches since the fall, but they persist in the same location. She is not on blood thinners or aspirin and has been taking ibuprofen and Tylenol for pain management.  Her medical history includes long-term use of Klonopin for anxiety, which she has been on for 32 years, and recent use of hydroxyzine to aid sleep due to anxiety about her daughter's upcoming bone marrow transplant. She reports vivid dreams since starting hydroxyzine. She also takes Zoloft for depression.  She has a history of low blood pressure, sometimes as low as 80/55.    PERTINENT PMH / PSH: As above  OBJECTIVE:  BP 93/67 Comment: standing  Pulse 89   Temp  97.6 F (36.4 C)   Resp 20   Ht 5\' 3"  (1.6 m)   Wt 124 lb 8 oz (56.5 kg)   SpO2 94%   BMI 22.05 kg/m    Physical Exam Vitals reviewed.  Constitutional:      General: She is not in acute distress.    Appearance: Normal appearance. She is normal weight. She is not ill-appearing, toxic-appearing or diaphoretic.  HENT:     Mouth/Throat:     Mouth: Mucous membranes are moist.  Eyes:     General:        Right eye: No discharge.        Left eye: No discharge.     Extraocular Movements: Extraocular movements intact.     Conjunctiva/sclera: Conjunctivae normal.  Cardiovascular:     Rate and Rhythm: Normal rate and regular rhythm.     Heart sounds: Normal heart sounds.  Pulmonary:     Effort: Pulmonary effort is normal.     Breath sounds: Normal breath sounds.  Abdominal:     General: Bowel sounds are normal.  Musculoskeletal:        General: Normal range of motion.  Skin:    General: Skin is warm and dry.  Neurological:     General: No focal deficit present.     Mental Status: She is alert and oriented to person, place, and time. Mental status is at baseline.     Sensory: Sensation is intact.     Coordination: Coordination normal.     Gait: Gait abnormal.     Deep Tendon  Reflexes: Reflexes normal.  Psychiatric:        Mood and Affect: Mood normal.        Behavior: Behavior normal.        Thought Content: Thought content normal.        Judgment: Judgment normal.           09/24/2023    2:01 PM 08/21/2023    1:27 PM 07/14/2023   10:50 AM 07/07/2023    3:11 PM 03/31/2023    1:21 PM  Depression screen PHQ 2/9  Decreased Interest 2 3 1 1  0  Down, Depressed, Hopeless 1 1 0 1 0  PHQ - 2 Score 3 4 1 2  0  Altered sleeping 1 3 3 1 3   Tired, decreased energy 1 3 0 1 3  Change in appetite 1 1 0 0 3  Feeling bad or failure about yourself  1 0 0 0 0  Trouble concentrating 0 3 0 0 0  Moving slowly or fidgety/restless 0 3 0 0 0  Suicidal thoughts 0 1 0 0 0  PHQ-9 Score 7 18 4 4  9   Difficult doing work/chores Somewhat difficult Very difficult Somewhat difficult Somewhat difficult Extremely dIfficult      09/24/2023    2:01 PM 08/21/2023    2:11 PM 07/07/2023    3:11 PM 03/31/2023    1:21 PM  GAD 7 : Generalized Anxiety Score  Nervous, Anxious, on Edge 2 3 0 3  Control/stop worrying 2 3 1  0  Worry too much - different things 1 1 0 0  Trouble relaxing 2 1 0 0  Restless 0 2 0 0  Easily annoyed or irritable 2 1 1 2   Afraid - awful might happen 2 3 0 0  Total GAD 7 Score 11 14 2 5   Anxiety Difficulty Very difficult Very difficult Somewhat difficult Very difficult    ASSESSMENT/PLAN:  Traumatic injury of head, initial encounter -     CT HEAD WO CONTRAST ( )    Assessment and Plan Assessment & Plan Worsening Vertigo Suspected exacerbation due to swelling from mild head trauma, possibly a mild concussion. Emphasized rest for brain recovery. - Order CT scan of the head without contrast to rule out any bleed. - Advise rest for the brain: no driving, reading, or watching TV until symptoms improve. - Prescribe Meclizine for dizziness. - Refer to ENT for further evaluation.  Nasal Bone Fractures Minimally displaced fractures of two nasal bones. Reviewed CT scan results and provided guidance on avoiding activities that could exacerbate the injury. - Advise against blowing nose, sneezing, using straws, smoking, or lifting heavy objects. - Recommend sleeping with head slightly elevated.  Anxiety and Sleep Disturbance Anxiety exacerbated by concerns about daughter's upcoming bone marrow transplant. Hydroxyzine for sleep causing vivid dreams. Discontinued hydroxyzine and plans to adjust medication regimen for better management. - Discontinue hydroxyzine. - Increase Buspar to three times a day. - Taper off Tylenol PM gradually. - Consider prescribing Remeron for sleep if covered by insurance. - Consult with pharmacist for alternative medication options. - Follow up  in 2-3 weeks to reassess medication regimen.  Chronic Low Blood Pressure Consistently low blood pressure, slightly improved today but still low. Plans to monitor closely, especially after recent head trauma. - Perform orthostatic blood pressure measurements. - Advise increasing fluid intake to maintain blood pressure.  Follow-up Outlined follow-up plan to ensure appropriate management of conditions. - Follow up with ENT for vertigo evaluation. - Schedule follow-up appointment  in 2-3 weeks to reassess medication regimen. - Instruct to go to the emergency department if symptoms worsen, such as increased dizziness, confusion, or severe headache.      PDMP reviewed  Return in about 4 weeks (around 10/22/2023) for PCP.  Dana Allan, MD

## 2023-09-24 NOTE — Patient Instructions (Addendum)
 It was a pleasure meeting you today. Thank you for allowing me to take part in your health care.  Our goals for today as we discussed include:  Will get imaging of brain today for worsening vertigo and headache   Start Meclizine 12.5 mg three times a day as needed for dizziness  Recommend decreasing Tylenol PM to 1 tablet at night  Stop Hydroxyzine Increase Buspar to three times a day  Follow up with ENT as soon as possible     This is a list of the screening recommended for you and due dates:  Health Maintenance  Topic Date Due   COVID-19 Vaccine (9 - 2024-25 season) 02/23/2023   Flu Shot  01/23/2024   Mammogram  03/04/2024   Medicare Annual Wellness Visit  07/13/2024   DTaP/Tdap/Td vaccine (2 - Td or Tdap) 02/24/2029   Pneumonia Vaccine  Completed   DEXA scan (bone density measurement)  Completed   Hepatitis C Screening  Completed   Zoster (Shingles) Vaccine  Completed   HPV Vaccine  Aged Out   Colon Cancer Screening  Discontinued     If you have any questions or concerns, please do not hesitate to call the office at [phone].  I look forward to our next visit and until then take care and stay safe.  Regards,   Dana Allan, MD   [facility]

## 2023-09-24 NOTE — Telephone Encounter (Signed)
 Patient is scheduled for appointment today with provider Clent Ridges at 1:40 PM

## 2023-10-01 ENCOUNTER — Encounter: Payer: Self-pay | Admitting: Family Medicine

## 2023-10-02 ENCOUNTER — Ambulatory Visit: Admitting: Internal Medicine

## 2023-10-06 ENCOUNTER — Ambulatory Visit
Admission: RE | Admit: 2023-10-06 | Discharge: 2023-10-06 | Disposition: A | Discharge status: Home / Self Care | Source: Ambulatory Visit | Attending: Family Medicine | Admitting: Family Medicine

## 2023-10-06 DIAGNOSIS — Z780 Asymptomatic menopausal state: Secondary | ICD-10-CM | POA: Diagnosis not present

## 2023-10-06 DIAGNOSIS — M810 Age-related osteoporosis without current pathological fracture: Secondary | ICD-10-CM | POA: Diagnosis not present

## 2023-10-09 DIAGNOSIS — M25511 Pain in right shoulder: Secondary | ICD-10-CM | POA: Diagnosis not present

## 2023-10-09 DIAGNOSIS — M25562 Pain in left knee: Secondary | ICD-10-CM | POA: Diagnosis not present

## 2023-10-13 ENCOUNTER — Ambulatory Visit: Payer: Self-pay

## 2023-10-13 NOTE — Telephone Encounter (Signed)
 Chief Complaint: Constipation Symptoms: Abdominal bloating, 4/10 pain to rectum, hemorrhoids w/blood on tissue when wiped Frequency: Onset 2 weeks ago w/loose stools, then past week constipation Pertinent Negatives: Patient denies other symptoms Disposition: [] ED /[] Urgent Care (no appt availability in office) / [x] Appointment(In office/virtual)/ []  [facility]/ [] Home Care/ [] Refused Recommended Disposition /[] [place] [facility]/ []  Follow-up with PCP Additional Notes: Patient says 2 weeks ago she had loose stools, no form, then it turned into constipation and she couldn't have a BM for a week. She says she took miralax, senokot with no BM, but had particles in the toilet. She says she hasn't taken anything now and only took the miralax for a couple days. She says she has seen blood on tissue from the hemorrhoids she has now. She says she's noticed abdominal bloating after eating, not right off, but as the day goes on. Advised OV and soonest with PCP is on Wednesday, but with another provider tomorrow. She says she takes her [facility] to [facility] on Tuesday's, so Wednesday is fine to see Dr. Sueanne Emerald. Advised to take the miralax again in warm liquid twice today to see if it helps her to have a BM tomorrow, she says she will try that. I asked about milk of magnesia, she says she can't tolerate drinking that. She says she will call back if she has a BM. I advised she doesn't have to call us  back, but she will need to keep the appointment to have an evaluation of the constipation and hemorrhoids, she verbalized understanding.   Copied From CRM [id number]. Reason for Triage: PT called in to for uncontrollable bowel movements. Pt feels bloated by afternoon. Timing is off and uncontrollable would like to see primary    Reason for Disposition  Last bowel movement (BM) > 4 days ago  Answer Assessment - Initial Assessment Questions 1. STOOL PATTERN OR FREQUENCY: "How often do you have a bowel  movement (BM)?"  (Normal range: 3 times a day to every 3 days)  "When was your last BM?"       Normally everyday bowel 2. STRAINING: "Do you have to strain to have a BM?"      Sometimes if I don't miss a day 3. RECTAL PAIN: "Does your rectum hurt when the stool comes out?" If Yes, ask: "Do you have hemorrhoids? How bad is the pain?"  (Scale 1-10; or mild, moderate, severe)     Yes, burning, 4/10 during the process 4. STOOL COMPOSITION: "Are the stools hard?"      No  5. BLOOD ON STOOLS: "Has there been any blood on the toilet tissue or on the surface of the BM?" If Yes, ask: "When was the last time?"     Yes bright red on the tissue, has hemorrhoids 6. CHRONIC CONSTIPATION: "Is this a new problem for you?"  If No, ask: "How long have you had this problem?" (days, weeks, months)      New 7. CHANGES IN DIET OR HYDRATION: "Have there been any recent changes in your diet?" "How much fluids are you drinking on a daily basis?"  "How much have you had to drink today?"     No changes, drink 52 oz water  a day 8. MEDICINES: "Have you been taking any new medicines?" "Are you taking any narcotic pain medicines?" (e.g., Dilaudid, morphine, Percocet, Vicodin)     No 9. LAXATIVES: "Have you been using any stool softeners, laxatives, or enemas?"  If Yes, ask "What, how often, and  when was the last time?"     Miralax, senokot 10. ACTIVITY:  "How much walking do you do every day?"  "Has your activity level decreased in the past week?"       No 11. CAUSE: "What do you think is causing the constipation?"        Unsure 12. OTHER SYMPTOMS: "Do you have any other symptoms?" (e.g., abdomen pain, bloating, fever, vomiting)       Abdominal bloating 13. MEDICAL HISTORY: "Do you have a history of hemorrhoids, rectal fissures, or rectal surgery or rectal abscess?"         Currently have hemorrhoids  Protocols used: Constipation-A-AH

## 2023-10-14 ENCOUNTER — Other Ambulatory Visit: Payer: Self-pay

## 2023-10-15 ENCOUNTER — Ambulatory Visit: Admitting: Family Medicine

## 2023-10-15 MED ORDER — SERTRALINE HCL 100 MG PO TABS: 200.0000 mg | ORAL_TABLET | Freq: Every day | ORAL | 1 refills | Status: DC

## 2023-10-21 ENCOUNTER — Telehealth: Payer: Self-pay

## 2023-10-21 NOTE — Telephone Encounter (Signed)
 Dr. Antony Baumgartner is leaving [facility] at the end of May and going to [facility]. I did send the referral to them so when he starts pt can already have the referral in place.

## 2023-10-21 NOTE — Telephone Encounter (Signed)
 Copied from CRM [id number]. Topic: Referral - Question >> Oct 21, 2023  9:59 AM DeAngela L wrote: Reason for CRM: Patient would like to ask If she could have another referral to see Dr Luke Salaam at [facility] at [facility] Phone: [phone]

## 2023-10-21 NOTE — Addendum Note (Signed)
 Addended by: Barb Levers on: 10/21/2023 03:14 PM   Modules accepted: Orders

## 2023-10-23 DIAGNOSIS — M25812 Other specified joint disorders, left shoulder: Secondary | ICD-10-CM | POA: Diagnosis not present

## 2023-10-23 DIAGNOSIS — M25811 Other specified joint disorders, right shoulder: Secondary | ICD-10-CM | POA: Diagnosis not present

## 2023-10-31 ENCOUNTER — Encounter: Payer: Self-pay | Admitting: Nurse Practitioner

## 2023-11-11 ENCOUNTER — Ambulatory Visit (INDEPENDENT_AMBULATORY_CARE_PROVIDER_SITE_OTHER): Admitting: Family Medicine

## 2023-11-11 ENCOUNTER — Encounter: Payer: Self-pay | Admitting: Family Medicine

## 2023-11-11 VITALS — BP 100/62 | HR 73 | Temp 97.7°F | Resp 20 | Ht 63.0 in | Wt 118.5 lb

## 2023-11-11 DIAGNOSIS — F32A Depression, unspecified: Secondary | ICD-10-CM

## 2023-11-11 DIAGNOSIS — M810 Age-related osteoporosis without current pathological fracture: Secondary | ICD-10-CM

## 2023-11-11 DIAGNOSIS — E559 Vitamin D deficiency, unspecified: Secondary | ICD-10-CM

## 2023-11-11 DIAGNOSIS — E039 Hypothyroidism, unspecified: Secondary | ICD-10-CM

## 2023-11-11 DIAGNOSIS — E7800 Pure hypercholesterolemia, unspecified: Secondary | ICD-10-CM | POA: Diagnosis not present

## 2023-11-11 DIAGNOSIS — F39 Unspecified mood [affective] disorder: Secondary | ICD-10-CM | POA: Diagnosis not present

## 2023-11-11 MED ORDER — SERTRALINE HCL 100 MG PO TABS: 200.0000 mg | ORAL_TABLET | Freq: Every day | ORAL | 1 refills | Status: DC

## 2023-11-11 MED ORDER — BUSPIRONE HCL 7.5 MG PO TABS: 7.5000 mg | ORAL_TABLET | Freq: Three times a day (TID) | ORAL | 2 refills | Status: DC

## 2023-11-11 MED ORDER — SIMVASTATIN 20 MG PO TABS
20.0000 mg | ORAL_TABLET | Freq: Every evening | ORAL | 1 refills | Status: DC
Start: 2023-11-11 — End: 2024-05-12

## 2023-11-11 NOTE — Patient Instructions (Addendum)
 It was a pleasure meeting you today. Thank you for allowing me to take part in your health care.  Our goals for today as we discussed include:  We will get some labs today.  If they are abnormal or we need to do something about them, I will call you.  If they are normal, I will send you a message on MyChart (if it is active) or a letter in the mail.  If you don't hear from us  in 2 weeks, please call the office at the number below.    Restart Buspar  7.5 mg three times a day Continue Zoloft  200 mg daily Continue Klonipin 1 mg two times a day as needed.  Recommend using sparingly as discussed  Referral sent to psychology for therapy  Dexa shows Osteoporosis Recommend Vitamin D  and Calcium supplements Recommend Fosamax weekly or can do Prolia injection every 6 months  This is a list of the screening recommended for you and due dates:  Health Maintenance  Topic Date Due   COVID-19 Vaccine (9 - 2024-25 season) 02/23/2023   Flu Shot  01/23/2024   Mammogram  03/04/2024   Medicare Annual Wellness Visit  07/13/2024   DTaP/Tdap/Td vaccine (2 - Td or Tdap) 02/24/2029   Pneumonia Vaccine  Completed   DEXA scan (bone density measurement)  Completed   Hepatitis C Screening  Completed   Zoster (Shingles) Vaccine  Completed   HPV Vaccine  Aged Out   Meningitis B Vaccine  Aged Out   Colon Cancer Screening  Discontinued     If you have any questions or concerns, please do not hesitate to call the office at [phone].  I look forward to our next visit and until then take care and stay safe.  Regards,   Valli Gaw, MD   [facility]

## 2023-11-11 NOTE — Progress Notes (Unsigned)
 SUBJECTIVE:   Chief Complaint  Patient presents with   Stress    Wants to talk about stress management    Insomnia   HPI Presents for follow up chronic disease mangement  Discussed the use of AI scribe software for clinical note transcription with the patient, who gave verbal consent to proceed.  History of Present Illness Pam Salazar is a 78 year old female who presents with difficulty handling stress related to caregiving responsibilities for her daughter with acute myeloid leukemia.  She experiences significant stress due to her caregiving responsibilities for her 68 year old daughter, diagnosed with acute myeloid leukemia in October of the previous year. Her daughter underwent a bone marrow transplant approximately 35 days ago and requires frequent hospital visits, involving six hours of travel twice a week. This has been physically and emotionally taxing, as her daughter stays with her during the week, adding to the stress of managing her care and finding suitable meals.  She experiences episodes of intense anger and rage, particularly in the late afternoon, which she attributes to the stress of the situation. These episodes are not directed at her daughter but manifest as frustration towards her daughter's husband, who does not assist with caregiving duties due to work commitments. She is concerned about the potential for her daughter's condition to worsen suddenly, which adds to her stress.  She is currently taking Zoloft  at the maximum dose and Buspar  twice daily, although she often misses the noon dose. She also takes Klonopin , which she is cautious about due to its potential to cause falls. She manages her sleep with melatonin and Tylenol  PM, having stopped using gabapentin , which she previously took from her husband's supply for insomnia.  Her daughter has three children, two of whom are in their twenties and one who is sixteen. The family dynamics add complexity to the  caregiving situation, as her daughter's husband and other family members have not been involved in the caregiving process. Her daughter has been non-compliant with some post-transplant care instructions, such as avoiding live flowers and dirt, which concerns her given her daughter's immunocompromised state.  Her social history includes a strong religious faith, which she relies on for comfort and strength. She has a supportive relationship with her husband, who assists with driving, but the overall situation is financially and emotionally draining. She has not found therapy helpful in the past but is open to exploring it again to manage her stress.     PERTINENT PMH / PSH: As above  OBJECTIVE:  BP 100/62   Pulse 73   Temp 97.7 F (36.5 C)   Resp 20   Ht 5\' 3"  (1.6 m)   Wt 118 lb 8 oz (53.8 kg)   SpO2 98%   BMI 20.99 kg/m    Physical Exam Vitals reviewed.  Constitutional:      General: She is not in acute distress.    Appearance: Normal appearance. She is normal weight. She is not ill-appearing, toxic-appearing or diaphoretic.  Eyes:     General:        Right eye: No discharge.        Left eye: No discharge.     Conjunctiva/sclera: Conjunctivae normal.  Cardiovascular:     Rate and Rhythm: Normal rate and regular rhythm.     Heart sounds: Normal heart sounds.  Pulmonary:     Effort: Pulmonary effort is normal.     Breath sounds: Normal breath sounds.  Musculoskeletal:  General: Normal range of motion.  Skin:    General: Skin is warm and dry.  Neurological:     General: No focal deficit present.     Mental Status: She is alert and oriented to person, place, and time. Mental status is at baseline.  Psychiatric:        Mood and Affect: Mood normal.        Behavior: Behavior normal.        Thought Content: Thought content normal.        Judgment: Judgment normal.           11/11/2023   12:56 PM 09/24/2023    2:01 PM 08/21/2023    1:27 PM 07/14/2023   10:50 AM  07/07/2023    3:11 PM  Depression screen PHQ 2/9  Decreased Interest 1 2 3 1 1   Down, Depressed, Hopeless 1 1 1  0 1  PHQ - 2 Score 2 3 4 1 2   Altered sleeping 2 1 3 3 1   Tired, decreased energy 1 1 3  0 1  Change in appetite 2 1 1  0 0  Feeling bad or failure about yourself  0 1 0 0 0  Trouble concentrating 0 0 3 0 0  Moving slowly or fidgety/restless 0 0 3 0 0  Suicidal thoughts 0 0 1 0 0  PHQ-9 Score 7 7 18 4 4   Difficult doing work/chores Somewhat difficult Somewhat difficult Very difficult Somewhat difficult Somewhat difficult      11/11/2023   12:57 PM 09/24/2023    2:01 PM 08/21/2023    2:11 PM 07/07/2023    3:11 PM  GAD 7 : Generalized Anxiety Score  Nervous, Anxious, on Edge 2 2 3  0  Control/stop worrying 2 2 3 1   Worry too much - different things 2 1 1  0  Trouble relaxing 2 2 1  0  Restless 2 0 2 0  Easily annoyed or irritable 3 2 1 1   Afraid - awful might happen 2 2 3  0  Total GAD 7 Score 15 11 14 2   Anxiety Difficulty Very difficult Very difficult Very difficult Somewhat difficult    ASSESSMENT/PLAN:  Mood disorder (HCC) Assessment & Plan: Chronic depression exacerbated by stress of daughters health issues. Emphasized consistent medication adherence and explored non-pharmacological interventions.   - Continue Zoloft  200 mg daily - Increase Buspar  to three times daily. - Continue Klonipin 1 mg BID.  Has been on long term.  She is aware of risks of long term use of medication.  Recommend weaning in future.   - Refer to therapy for stress management. - Encourage family support.  Orders: -     Comprehensive metabolic panel with GFR -     Ambulatory referral to Psychology -     Sertraline  HCl; Take 2 tablets (200 mg total) by mouth daily.  Dispense: 180 tablet; Refill: 1 -     busPIRone  HCl; Take 1 tablet (7.5 mg total) by mouth 3 (three) times daily.  Dispense: 180 tablet; Refill: 2  Vitamin D  deficiency -     VITAMIN D  25 Hydroxy (Vit-D Deficiency,  Fractures)  Hypercholesterolemia Assessment & Plan: Refill Zocor   Orders: -     Simvastatin ; Take 1 tablet (20 mg total) by mouth every evening.  Dispense: 90 tablet; Refill: 1  Age related osteoporosis, unspecified pathological fracture presence Assessment & Plan: Osteoporosis confirmed by DEXA. Discussed treatment options and importance of vitamin D  and calcium supplementation. - Initiate vitamin D  and calcium supplementation. -  Discuss bisphosphonate options. - Recommend Prolia injection q6 month.  Patient to decide and notify if wanting to start. - Check Vitamin D , Cmet today   Hypothyroidism, unspecified type Assessment & Plan: Stable on Synthroid  125mcg daily. -Check TSH  Orders: -     TSH; Future   PDMP reviewed  Return if symptoms worsen or fail to improve, for PCP.  Valli Gaw, MD

## 2023-11-12 ENCOUNTER — Ambulatory Visit: Payer: Self-pay | Admitting: Family Medicine

## 2023-11-12 LAB — COMPREHENSIVE METABOLIC PANEL WITH GFR
ALT: 15 U/L (ref 0–35)
AST: 17 U/L (ref 0–37)
Albumin: 4.6 g/dL (ref 3.5–5.2)
Alkaline Phosphatase: 47 U/L (ref 39–117)
BUN: 26 mg/dL — ABNORMAL HIGH (ref 6–23)
CO2: 28 meq/L (ref 19–32)
Calcium: 9.4 mg/dL (ref 8.4–10.5)
Chloride: 104 meq/L (ref 96–112)
Creatinine, Ser: 0.82 mg/dL (ref 0.40–1.20)
GFR: 68.99 mL/min (ref >60.00)
Glucose, Bld: 142 mg/dL — ABNORMAL HIGH (ref 70–99)
Potassium: 3.5 meq/L (ref 3.5–5.1)
Sodium: 142 meq/L (ref 135–145)
Total Bilirubin: 0.6 mg/dL (ref 0.2–1.2)
Total Protein: 6.8 g/dL (ref 6.0–8.3)

## 2023-11-12 LAB — VITAMIN D 25 HYDROXY (VIT D DEFICIENCY, FRACTURES): VITD: 64.22 ng/mL (ref 30.00–100.00)

## 2023-11-13 ENCOUNTER — Ambulatory Visit: Payer: Self-pay

## 2023-11-17 ENCOUNTER — Encounter: Payer: Self-pay | Admitting: Family Medicine

## 2023-11-17 DIAGNOSIS — M810 Age-related osteoporosis without current pathological fracture: Secondary | ICD-10-CM | POA: Insufficient documentation

## 2023-11-17 NOTE — Assessment & Plan Note (Signed)
 Chronic depression exacerbated by stress of daughters health issues. Emphasized consistent medication adherence and explored non-pharmacological interventions.   - Continue Zoloft  200 mg daily - Increase Buspar  to three times daily. - Continue Klonipin 1 mg BID.  Has been on long term.  She is aware of risks of long term use of medication.  Recommend weaning in future.   - Refer to therapy for stress management. - Encourage family support.

## 2023-11-17 NOTE — Assessment & Plan Note (Addendum)
 Stable on Synthroid  125mcg daily. -Check TSH

## 2023-11-17 NOTE — Assessment & Plan Note (Signed)
?   Refill Zocor.

## 2023-11-17 NOTE — Assessment & Plan Note (Signed)
 Osteoporosis confirmed by DEXA. Discussed treatment options and importance of vitamin D  and calcium supplementation. - Initiate vitamin D  and calcium supplementation. - Discuss bisphosphonate options. - Recommend Prolia injection q6 month.  Patient to decide and notify if wanting to start. - Check Vitamin D , Cmet today

## 2023-11-20 ENCOUNTER — Telehealth: Payer: Self-pay

## 2023-11-20 NOTE — Telephone Encounter (Signed)
-----   Message from Valli Gaw sent at 11/17/2023 11:55 AM EDT ----- Please check if patient has decided on Prolia injection for Osteoporosis.  Need to order and can take a few weeks to get approved.  She also needs TSH level. Can schedule lab appointment for this.    T

## 2023-11-20 NOTE — Telephone Encounter (Signed)
 Called pt and she stated that she is ok with what she is doing on her own. She does not want to start Prolia at this time. She stated that if she decided that she wants to start it she will give us  a call.

## 2023-12-01 ENCOUNTER — Ambulatory Visit: Payer: Self-pay

## 2023-12-01 ENCOUNTER — Telehealth: Payer: Self-pay | Admitting: Family Medicine

## 2023-12-01 NOTE — Telephone Encounter (Signed)
 FYI Only or Action Required?: FYI only for provider  Patient was last seen in primary care on 11/11/2023 by Valli Gaw, MD. Called Nurse Triage reporting Breast Mass. Symptoms began several days ago. Interventions attempted: Nothing. Symptoms are: unchanged.  Triage Disposition: See PCP When Office is Open (Within 3 Days)  Patient/caregiver understands and will follow disposition?: Yes   Reason for Disposition  Breast lump  Answer Assessment - Initial Assessment Questions 1. SYMPTOM: "What's the main symptom you're concerned about?"  (e.g., lump, pain, rash, nipple discharge)     Lump 2. LOCATION: "Where is the lump located?"     Lateral left breast  3. ONSET: "When was the lump found?"     4 days ago  5. CAUSE: "What do you think is causing this symptom?"     Unsure 6. OTHER SYMPTOMS: "Do you have any other symptoms?" (e.g., fever, breast pain, redness or rash, nipple discharge)     Tender to touch  Protocols used: Breast Symptoms-A-AH

## 2023-12-01 NOTE — Telephone Encounter (Signed)
 Copied from CRM [id number]. Topic: Appointments - Scheduling Inquiry for Clinic >> Dec 01, 2023 11:49 AM Alyse July wrote: Reason for CRM: Patient would like an appointment with provider for referral for a mammogram. Please contact patient to schedule. Lump found on left breast. [phone].  Patient only wants to see Dr Sueanne Emerald

## 2023-12-03 ENCOUNTER — Encounter: Payer: Self-pay | Admitting: Family

## 2023-12-03 ENCOUNTER — Ambulatory Visit (INDEPENDENT_AMBULATORY_CARE_PROVIDER_SITE_OTHER): Admitting: Family

## 2023-12-03 VITALS — BP 128/76 | HR 85 | Temp 97.5°F | Ht 63.0 in | Wt 119.2 lb

## 2023-12-03 DIAGNOSIS — N6321 Unspecified lump in the left breast, upper outer quadrant: Secondary | ICD-10-CM

## 2023-12-03 NOTE — Assessment & Plan Note (Signed)
 Asymmetry on exam.  Family h/o breast cancer. Pending diagnostic imaging

## 2023-12-03 NOTE — Patient Instructions (Addendum)
 Please call  and schedule your diagnostic ultrasound and mammogram as we discussed.   [facility]  ( new location in 2023)  [address], [place], [place] [address]  [place], [place]  [phone]   My pleasure meeting you today.

## 2023-12-03 NOTE — Progress Notes (Signed)
 Assessment & Plan:  Mass of upper outer quadrant of left breast Assessment & Plan: Asymmetry on exam.  Family h/o breast cancer. Pending diagnostic imaging  Orders: -     MM 3D DIAGNOSTIC MAMMOGRAM BILATERAL BREAST; Future -     US  LIMITED ULTRASOUND INCLUDING AXILLA LEFT BREAST ; Future     Return precautions given.   Risks, benefits, and alternatives of the medications and treatment plan prescribed today were discussed, and patient expressed understanding.   Education regarding symptom management and diagnosis given to patient on AVS either electronically or printed.  No follow-ups on file.  Bascom Bossier, FNP  Subjective:    Patient ID: Pam Salazar, female    DOB: January 01, 1946, 78 y.o.   MRN: [id number]  CC: Pam Salazar is a 78 y.o. female who presents today for an acute visit.    HPI: Complains of left breast 'lump' x 6 days Describes as tender No nipple discharge, fever, skin changes     H/o BCC, SCC Family hx breast cancer, maternal aunt, sister Mammogram UTD 03/04/24  Allergies: Duloxetine, Duloxetine hcl, Ezetimibe-simvastatin , and Hydroxyzine  Current Outpatient Medications on File Prior to Visit  Medication Sig Dispense Refill   busPIRone  (BUSPAR ) 7.5 MG tablet Take 1 tablet (7.5 mg total) by mouth 3 (three) times daily. 180 tablet 2   clonazePAM  (KLONOPIN ) 1 MG tablet TAKE 1 TABLET BY MOUTH 2 TIMES A DAY AS NEEDED FOR ANXIETY (Patient taking differently: Take 1 mg by mouth 2 (two) times daily.) 60 tablet 5   levothyroxine  (SYNTHROID ) 125 MCG tablet TAKE 1 TABLET BY MOUTH DAILY BEFORE BREAKFAST 90 tablet 1   Multiple Vitamins-Minerals (PRESERVISION AREDS 2 PO) Take 1 capsule by mouth 2 (two) times daily.      Omega 3 1000 MG CAPS Take by mouth daily.     sertraline  (ZOLOFT ) 100 MG tablet Take 2 tablets (200 mg total) by mouth daily. 180 tablet 1   simvastatin  (ZOCOR ) 20 MG tablet Take 1 tablet (20 mg total) by mouth every evening. 90 tablet 1    ciclopirox  (PENLAC ) 8 % solution Apply topically at bedtime. Apply over nail and surrounding skin. Apply daily over previous coat. After seven (7) days, may remove with alcohol and continue cycle. Apply for 4 weeks 6.6 mL 0   No current facility-administered medications on file prior to visit.    Review of Systems  Constitutional:  Negative for chills and fever.  Respiratory:  Negative for cough.   Cardiovascular:  Negative for chest pain and palpitations.  Gastrointestinal:  Negative for nausea and vomiting.      Objective:    BP 128/76   Pulse 85   Temp (!) 97.5 F (36.4 C) (Oral)   Ht 5' 3 (1.6 m)   Wt 119 lb 3.2 oz (54.1 kg)   SpO2 99%   BMI 21.12 kg/m   BP Readings from Last 3 Encounters:  12/03/23 128/76  11/11/23 100/62  09/24/23 93/67   Wt Readings from Last 3 Encounters:  12/03/23 119 lb 3.2 oz (54.1 kg)  11/11/23 118 lb 8 oz (53.8 kg)  09/24/23 124 lb 8 oz (56.5 kg)    Physical Exam Vitals reviewed.  Constitutional:      Appearance: She is well-developed.  Eyes:     Conjunctiva/sclera: Conjunctivae normal.  Cardiovascular:     Rate and Rhythm: Normal rate and regular rhythm.     Pulses: Normal pulses.     Heart sounds: Normal heart sounds.  Pulmonary:  Effort: Pulmonary effort is normal.     Breath sounds: Normal breath sounds. No wheezing, rhonchi or rales.  Chest:  Breasts:    Right: No swelling, inverted nipple, mass, nipple discharge, skin change or tenderness.     Left: Mass and tenderness present. No swelling, inverted nipple, nipple discharge or skin change.       Comments: Approximately 3:00 oclock 2-4 cm area of asymmetry left breast, tender.  Skin:    General: Skin is warm and dry.  Neurological:     Mental Status: She is alert.  Psychiatric:        Speech: Speech normal.        Behavior: Behavior normal.        Thought Content: Thought content normal.

## 2023-12-11 ENCOUNTER — Ambulatory Visit
Admission: RE | Admit: 2023-12-11 | Discharge: 2023-12-11 | Disposition: A | Discharge status: Home / Self Care | Source: Ambulatory Visit | Attending: Family

## 2023-12-11 ENCOUNTER — Ambulatory Visit
Admission: RE | Admit: 2023-12-11 | Discharge: 2023-12-11 | Disposition: A | Discharge status: Home / Self Care | Source: Ambulatory Visit | Attending: Family | Admitting: Family

## 2023-12-11 DIAGNOSIS — N6321 Unspecified lump in the left breast, upper outer quadrant: Secondary | ICD-10-CM | POA: Diagnosis present

## 2023-12-11 DIAGNOSIS — R92323 Mammographic fibroglandular density, bilateral breasts: Secondary | ICD-10-CM | POA: Diagnosis not present

## 2023-12-18 ENCOUNTER — Telehealth: Payer: Self-pay | Admitting: Family Medicine

## 2023-12-18 NOTE — Telephone Encounter (Signed)
 Lm to call office and reschedule 6/27/205 appt, provider out sick.  E2C2 please schedule with any other provider that is available, next week.

## 2023-12-19 ENCOUNTER — Ambulatory Visit

## 2023-12-24 ENCOUNTER — Ambulatory Visit
Admission: RE | Admit: 2023-12-24 | Discharge: 2023-12-24 | Disposition: A | Discharge status: Home / Self Care | Source: Ambulatory Visit | Attending: Internal Medicine | Admitting: Internal Medicine

## 2023-12-24 DIAGNOSIS — R768 Other specified abnormal immunological findings in serum: Secondary | ICD-10-CM | POA: Insufficient documentation

## 2023-12-24 DIAGNOSIS — Z87891 Personal history of nicotine dependence: Secondary | ICD-10-CM | POA: Diagnosis not present

## 2023-12-24 DIAGNOSIS — R911 Solitary pulmonary nodule: Secondary | ICD-10-CM | POA: Diagnosis not present

## 2023-12-24 DIAGNOSIS — I7300 Raynaud's syndrome without gangrene: Secondary | ICD-10-CM | POA: Insufficient documentation

## 2023-12-24 DIAGNOSIS — R918 Other nonspecific abnormal finding of lung field: Secondary | ICD-10-CM | POA: Diagnosis not present

## 2023-12-24 DIAGNOSIS — D8989 Other specified disorders involving the immune mechanism, not elsewhere classified: Secondary | ICD-10-CM | POA: Insufficient documentation

## 2023-12-24 DIAGNOSIS — J849 Interstitial pulmonary disease, unspecified: Secondary | ICD-10-CM | POA: Diagnosis not present

## 2023-12-24 DIAGNOSIS — J984 Other disorders of lung: Secondary | ICD-10-CM | POA: Diagnosis not present

## 2023-12-31 ENCOUNTER — Ambulatory Visit

## 2023-12-31 DIAGNOSIS — Z87891 Personal history of nicotine dependence: Secondary | ICD-10-CM

## 2023-12-31 DIAGNOSIS — D8989 Other specified disorders involving the immune mechanism, not elsewhere classified: Secondary | ICD-10-CM

## 2023-12-31 DIAGNOSIS — R918 Other nonspecific abnormal finding of lung field: Secondary | ICD-10-CM

## 2023-12-31 DIAGNOSIS — I7300 Raynaud's syndrome without gangrene: Secondary | ICD-10-CM

## 2023-12-31 DIAGNOSIS — R768 Other specified abnormal immunological findings in serum: Secondary | ICD-10-CM

## 2023-12-31 LAB — PULMONARY FUNCTION TEST
DL/VA % pred: 51 %
DL/VA: 2.12 ml/min/mmHg/L
DLCO cor % pred: 58 %
DLCO cor: 10.45 ml/min/mmHg
DLCO unc % pred: 58 %
DLCO unc: 10.45 ml/min/mmHg
FEF 25-75 Pre: 2.01 L/s
FEF2575-%Pred-Pre: 139 %
FEV1-%Pred-Pre: 137 %
FEV1-Pre: 2.55 L
FEV1FVC-%Pred-Pre: 101 %
FEV6-%Pred-Pre: 139 %
FEV6-Pre: 3.3 L
FEV6FVC-%Pred-Pre: 103 %
FVC-%Pred-Pre: 134 %
FVC-Pre: 3.35 L
Pre FEV1/FVC ratio: 76 %
Pre FEV6/FVC Ratio: 98 %

## 2023-12-31 NOTE — Patient Instructions (Signed)
Spirometry/dlco performed today. 

## 2023-12-31 NOTE — Progress Notes (Signed)
Spirometry/dlco performed today. 

## 2024-01-06 ENCOUNTER — Ambulatory Visit (INDEPENDENT_AMBULATORY_CARE_PROVIDER_SITE_OTHER): Admitting: Dermatology

## 2024-01-06 ENCOUNTER — Encounter: Payer: Self-pay | Admitting: Dermatology

## 2024-01-06 ENCOUNTER — Telehealth: Payer: Self-pay

## 2024-01-06 DIAGNOSIS — L578 Other skin changes due to chronic exposure to nonionizing radiation: Secondary | ICD-10-CM

## 2024-01-06 DIAGNOSIS — L821 Other seborrheic keratosis: Secondary | ICD-10-CM | POA: Diagnosis not present

## 2024-01-06 DIAGNOSIS — Z1283 Encounter for screening for malignant neoplasm of skin: Secondary | ICD-10-CM

## 2024-01-06 DIAGNOSIS — L649 Androgenic alopecia, unspecified: Secondary | ICD-10-CM

## 2024-01-06 DIAGNOSIS — Z79899 Other long term (current) drug therapy: Secondary | ICD-10-CM

## 2024-01-06 DIAGNOSIS — L814 Other melanin hyperpigmentation: Secondary | ICD-10-CM

## 2024-01-06 DIAGNOSIS — D1801 Hemangioma of skin and subcutaneous tissue: Secondary | ICD-10-CM

## 2024-01-06 DIAGNOSIS — L820 Inflamed seborrheic keratosis: Secondary | ICD-10-CM | POA: Diagnosis not present

## 2024-01-06 DIAGNOSIS — W908XXA Exposure to other nonionizing radiation, initial encounter: Secondary | ICD-10-CM

## 2024-01-06 DIAGNOSIS — D692 Other nonthrombocytopenic purpura: Secondary | ICD-10-CM

## 2024-01-06 DIAGNOSIS — Z85828 Personal history of other malignant neoplasm of skin: Secondary | ICD-10-CM

## 2024-01-06 DIAGNOSIS — L720 Epidermal cyst: Secondary | ICD-10-CM

## 2024-01-06 DIAGNOSIS — D229 Melanocytic nevi, unspecified: Secondary | ICD-10-CM

## 2024-01-06 MED ORDER — FINASTERIDE 5 MG PO TABS: 5.0000 mg | ORAL_TABLET | Freq: Every day | ORAL | 2 refills | Status: DC

## 2024-01-06 MED ORDER — MINOXIDIL 2.5 MG PO TABS: 2.5000 mg | ORAL_TABLET | Freq: Every day | ORAL | 2 refills | Status: DC

## 2024-01-06 NOTE — Progress Notes (Signed)
 Follow-Up Visit   Subjective  Pam Salazar is a 78 y.o. female who presents for the following: Skin Cancer Screening and Full Body Skin Exam. Hx of SCC, Hx of BCC.   The patient presents for Total-Body Skin Exam (TBSE) for skin cancer screening and mole check. The patient has spots, moles and lesions to be evaluated, some may be new or changing and the patient may have concern these could be cancer.  She has spots on chest that have changed in color, peeled and are bothersome.  Check hair thinning. +family Hx of hair thinning with age. States she cannot take biotin.   The following portions of the chart were reviewed this encounter and updated as appropriate: medications, allergies, medical history  Review of Systems:  No other skin or systemic complaints except as noted in HPI or Assessment and Plan.  Objective  Well appearing patient in no apparent distress; mood and affect are within normal limits.  A full examination was performed including scalp, head, eyes, ears, nose, lips, neck, chest, axillae, abdomen, back, buttocks, bilateral upper extremities, bilateral lower extremities, hands, feet, fingers, toes, fingernails, and toenails. All findings within normal limits unless otherwise noted below.   Relevant physical exam findings are noted in the Assessment and Plan.  Exam of nails limited by presence of nail polish.                 Left lower clavicle x5, R anterior axilla x1 (6) Erythematous keratotic or waxy stuck-on papule or plaque.   Assessment & Plan   SKIN CANCER SCREENING PERFORMED TODAY.  ACTINIC DAMAGE - Chronic condition, secondary to cumulative UV/sun exposure - diffuse scaly erythematous macules with underlying dyspigmentation - Recommend daily broad spectrum sunscreen SPF 30+ to sun-exposed areas, reapply every 2 hours as needed.  - Staying in the shade or wearing long sleeves, sun glasses (UVA+UVB protection) and wide brim hats (4-inch  brim around the entire circumference of the hat) are also recommended for sun protection.  - Call for new or changing lesions.  LENTIGINES, SEBORRHEIC KERATOSES, HEMANGIOMAS - Benign normal skin lesions - Benign-appearing - Call for any changes  MELANOCYTIC NEVI - Tan-brown and/or pink-flesh-colored symmetric macules and papules - Benign appearing on exam today - Observation - Call clinic for new or changing moles - Recommend daily use of broad spectrum spf 30+ sunscreen to sun-exposed areas.  - Check nails when remove polish.  SEBORRHEIC KERATOSIS, darker secondary to self tanner on legs. - Stuck-on, waxy, tan-brown papules at legs. Vertex scalp - Benign-appearing - Discussed benign etiology and prognosis. - Observe - Call for any changes  Purpura - Chronic; persistent and recurrent.  Treatable, but not curable. - Violaceous macules and patches at forearms - Benign - Related to trauma, age, sun damage and/or use of blood thinners, chronic use of topical and/or oral steroids - Observe - Can use OTC arnica containing moisturizer such as Dermend Bruise Formula if desired - Call for worsening or other concerns  ANDROGENETIC ALOPECIA (FEMALE PATTERN HAIR LOSS) Exam: Diffuse thinning of the crown and widening of the midline part with retention of the frontal hairline  Chronic and persistent condition with duration or expected duration over one year. Condition is bothersome/symptomatic for patient. Currently flared.  BP: 84/63  Female Androgenic Alopecia is a chronic condition related to genetics and/or hormonal changes.  In women androgenetic alopecia is commonly associated with menopause but may occur any time after puberty.  It causes hair thinning primarily on the  crown with widening of the part and temporal hairline recession.  Can use OTC Rogaine  (minoxidil ) 5% solution/foam as directed.  Oral treatments in female patients who have no contraindication may include : - Low dose  oral minoxidil  1.25 - 5mg  daily - Spironolactone 50 - 100mg  bid - Finasteride  2.5 - 5 mg daily Adjunctive therapies include: - Low Level Laser Light Therapy (LLLT) - Platelet-rich plasma injections (PRP) - Hair Transplants or scalp reduction   Treatment Plan: Start Minoxidil  2.5 mg 1/2 tablet daily. Watch for side effects light-headedness, dizziness since pt has low BP  Start Finasteride  5 mg take 1/2 tablet daily.   Doses of oral minoxidil  for hair loss are considered 'low dose'. This is because the doses used for hair loss are much lower than the doses which are used for conditions such as high blood pressure (hypertension). The doses used for hypertension are 10-40mg  per day.  Side effects are uncommon at the low doses (up to 2.5 mg/day) used to treat hair loss. Potential side effects, more commonly seen at higher doses, include: Increase in hair growth (hypertrichosis) elsewhere on face and body Temporary hair shedding upon starting medication which may last up to 4 weeks Ankle swelling, fluid retention, rapid weight gain more than 5 pounds Low blood pressure and feeling lightheaded or dizzy when standing up quickly Fast or irregular heartbeat Headaches  Long term medication management.  Patient is using long term (months to years) prescription medication  to control their dermatologic condition.  These medications require periodic monitoring to evaluate for efficacy and side effects and may require periodic laboratory monitoring.   Milia - tiny firm white papule at nasal dorsum - type of cyst - benign - sometimes these will clear with nightly OTC adapalene/Differin 0.1% gel or retinol. - may be extracted if symptomatic - observe   INFLAMED SEBORRHEIC KERATOSIS (6) Left lower clavicle x5, R anterior axilla x1 (6) Symptomatic, irritating, patient would like treated. Destruction of lesion - Left lower clavicle x5, R anterior axilla x1 (6)  Destruction method: cryotherapy    Informed consent: discussed and consent obtained   Lesion destroyed using liquid nitrogen: Yes   Region frozen until ice ball extended beyond lesion: Yes   Outcome: patient tolerated procedure well with no complications   Post-procedure details: wound care instructions given   Additional details:  Prior to procedure, discussed risks of blister formation, small wound, skin dyspigmentation, or rare scar following cryotherapy. Recommend Vaseline ointment to treated areas while healing.   Return in about 1 year (around 01/05/2025) for TBSE, HxSCC, HxBCC; 6 months hair loss.  I, Jill Parcell, CMA, am acting as scribe for Rexene Rattler, MD.   Documentation: I have reviewed the above documentation for accuracy and completeness, and I agree with the above.  Rexene Rattler, MD

## 2024-01-06 NOTE — Telephone Encounter (Signed)
 Received incoming call from pt as she was at [facility] with no lab orders. Per lov note and thoroughly checking pts chart there is nothing stating that pt is to have labs.  Per lov summary  -- Given the minimal symptoms state I would recommend continued monitoring at this stage             -Do high-resolution CT chest in 5-6 months [cancel lung cancer screening in August 2025]             -Do spirometry and DLCO in 5-6 months   Patient has completed pft and CT and has appt 7/23 to review. Nfn

## 2024-01-06 NOTE — Patient Instructions (Addendum)
 For hair thinning: Prescriptions will say take 1 tablet daily. Take 1/2 tablet daily as tolerated/as directed by Dr. Jackquline.   Start Minoxidil  2.5 mg 1/2 tablet daily.  Start Finasteride  5 mg take 1/2 tablet daily.   Doses of oral minoxidil  for hair loss are considered 'low dose'. This is because the doses used for hair loss are much lower than the doses which are used for conditions such as high blood pressure (hypertension). The doses used for hypertension are 10-40mg  per day.  Side effects are uncommon at the low doses (up to 2.5 mg/day) used to treat hair loss. Potential side effects, more commonly seen at higher doses, include: Increase in hair growth (hypertrichosis) elsewhere on face and body Temporary hair shedding upon starting medication which may last up to 4 weeks Ankle swelling, fluid retention, rapid weight gain more than 5 pounds Low blood pressure and feeling lightheaded or dizzy when standing up quickly Fast or irregular heartbeat Headaches  Cryotherapy Aftercare  Wash gently with soap and water  everyday.   Apply Vaseline Jelly daily until healed.    Recommend daily broad spectrum sunscreen SPF 30+ to sun-exposed areas, reapply every 2 hours as needed. Call for new or changing lesions.  Staying in the shade or wearing long sleeves, sun glasses (UVA+UVB protection) and wide brim hats (4-inch brim around the entire circumference of the hat) are also recommended for sun protection.     Melanoma ABCDEs  Melanoma is the most dangerous type of skin cancer, and is the leading cause of death from skin disease.  You are more likely to develop melanoma if you: Have light-colored skin, light-colored eyes, or red or blond hair Spend a lot of time in the sun Tan regularly, either outdoors or in a tanning bed Have had blistering sunburns, especially during childhood Have a close family member who has had a melanoma Have atypical moles or large birthmarks  Early detection of  melanoma is key since treatment is typically straightforward and cure rates are extremely high if we catch it early.   The first sign of melanoma is often a change in a mole or a new dark spot.  The ABCDE system is a way of remembering the signs of melanoma.  A for asymmetry:  The two halves do not match. B for border:  The edges of the growth are irregular. C for color:  A mixture of colors are present instead of an even brown color. D for diameter:  Melanomas are usually (but not always) greater than 6mm - the size of a pencil eraser. E for evolution:  The spot keeps changing in size, shape, and color.  Please check your skin once per month between visits. You can use a small mirror in front and a large mirror behind you to keep an eye on the back side or your body.   If you see any new or changing lesions before your next follow-up, please call to schedule a visit.  Please continue daily skin protection including broad spectrum sunscreen SPF 30+ to sun-exposed areas, reapplying every 2 hours as needed when you're outdoors.   Staying in the shade or wearing long sleeves, sun glasses (UVA+UVB protection) and wide brim hats (4-inch brim around the entire circumference of the hat) are also recommended for sun protection.      Due to recent changes in healthcare laws, you may see results of your pathology and/or laboratory studies on MyChart before the doctors have had a chance to review them.  We understand that in some cases there may be results that are confusing or concerning to you. Please understand that not all results are received at the same time and often the doctors may need to interpret multiple results in order to provide you with the best plan of care or course of treatment. Therefore, we ask that you please give us  2 business days to thoroughly review all your results before contacting the office for clarification. Should we see a critical lab result, you will be contacted  sooner.   If You Need Anything After Your Visit  If you have any questions or concerns for your doctor, please call our main line at [phone] and press option 4 to reach your doctor's medical assistant. If no one answers, please leave a voicemail as directed and we will return your call as soon as possible. Messages left after 4 pm will be answered the following business day.   You may also send us  a message via MyChart. We typically respond to MyChart messages within 1-2 business days.  For prescription refills, please ask your pharmacy to contact our office. Our fax number is [phone].  If you have an urgent issue when the clinic is closed that cannot wait until the next business day, you can page your doctor at the number below.    Please note that while we do our best to be available for urgent issues outside of office hours, we are not available 24/7.   If you have an urgent issue and are unable to reach us , you may choose to seek medical care at your doctor's office, retail clinic, urgent care center, or emergency room.  If you have a medical emergency, please immediately call 911 or go to the emergency department.  Pager Numbers  - Dr. Hester: [phone]  - Dr. Jackquline: [phone]  - Dr. Claudene: [phone]   In the event of inclement weather, please call our main line at [phone] for an update on the status of any delays or closures.  Dermatology Medication Tips: Please keep the boxes that topical medications come in in order to help keep track of the instructions about where and how to use these. Pharmacies typically print the medication instructions only on the boxes and not directly on the medication tubes.   If your medication is too expensive, please contact our office at [phone] option 4 or send us  a message through MyChart.   We are unable to tell what your co-pay for medications will be in advance as this is different depending on your  insurance coverage. However, we may be able to find a substitute medication at lower cost or fill out paperwork to get insurance to cover a needed medication.   If a prior authorization is required to get your medication covered by your insurance company, please allow us  1-2 business days to complete this process.  Drug prices often vary depending on where the prescription is filled and some pharmacies may offer cheaper prices.  The website www.goodrx.com contains coupons for medications through different pharmacies. The prices here do not account for what the cost may be with help from insurance (it may be cheaper with your insurance), but the website can give you the price if you did not use any insurance.  - You can print the associated coupon and take it with your prescription to the pharmacy.  - You may also stop by our office during regular business hours and pick up a GoodRx coupon card.  -  If you need your prescription sent electronically to a different pharmacy, notify our office through [facility] or by phone at [phone] option 4.     Si Usted Necesita Algo Despus de Su Visita  Tambin puede enviarnos un mensaje a travs de Clinical cytogeneticist. Por lo general respondemos a los mensajes de MyChart en el transcurso de 1 a 2 das hbiles.  Para renovar recetas, por favor pida a su farmacia que se ponga en contacto con nuestra oficina. Randi lakes de fax es [place] [phone].  Si tiene un asunto urgente cuando la clnica est cerrada y que no puede esperar hasta el siguiente da hbil, puede llamar/localizar a su doctor(a) al nmero que aparece a continuacin.   Por favor, tenga en cuenta que aunque hacemos todo lo posible para estar disponibles para asuntos urgentes fuera del horario de [place], no estamos disponibles las 24 horas del da, los 7 [address]  [address] de la [place].   Si tiene un problema urgente y no puede comunicarse con nosotros, puede optar por buscar atencin mdica  en el  consultorio de su doctor(a), en una clnica privada, en un centro de atencin urgente o en una sala de emergencias.  Si tiene Engineer, drilling, por favor llame inmediatamente al 911 o vaya a la sala de emergencias.  Nmeros de bper  - Dr. Hester: [phone]  - Dra. Jackquline: [phone]  - Dr. Claudene: [phone]   En caso de inclemencias del tiempo, por favor llame a landry capes principal al [phone] para una actualizacin sobre el [place] de cualquier retraso o cierre.  Consejos para la medicacin en dermatologa: Por favor, guarde las cajas en las que vienen los medicamentos de uso tpico para ayudarle a seguir las instrucciones sobre dnde y cmo usarlos. Las farmacias generalmente imprimen las instrucciones del medicamento slo en las cajas y no directamente en los tubos del [place].   Si su medicamento es muy caro, por favor, pngase en contacto con landry rieger llamando al [phone] y presione la opcin 4 o envenos un mensaje a travs de Clinical cytogeneticist.   No podemos decirle cul ser su copago por los medicamentos por adelantado ya que esto es diferente dependiendo de la cobertura de su seguro. Sin embargo, es posible que podamos encontrar un medicamento sustituto a Audiological scientist un formulario para que el seguro cubra el medicamento que se considera necesario.   Si se requiere una autorizacin previa para que su compaa de seguros malta su medicamento, por favor permtanos de 1 a 2 das hbiles para completar este proceso.  Los precios de los medicamentos varan con frecuencia dependiendo del Environmental consultant de dnde se surte la receta y alguna farmacias pueden ofrecer precios ms baratos.  El sitio web www.goodrx.com tiene cupones para medicamentos de Health and safety inspector. Los precios aqu no tienen en cuenta lo que podra costar con la ayuda del seguro (puede ser ms barato con su seguro), pero el sitio web puede darle el precio si no utiliz Tourist information centre manager.  -  Puede imprimir el cupn correspondiente y llevarlo con su receta a la farmacia.  - Tambin puede pasar por nuestra oficina durante el horario de atencin regular y Education officer, museum una tarjeta de cupones de GoodRx.  - Si necesita que su receta se enve electrnicamente a una farmacia diferente, informe a nuestra oficina a travs de MyChart de [place] o por telfono llamando al [phone] y presione la opcin 4.

## 2024-01-08 NOTE — Telephone Encounter (Signed)
 Called pt to schedule new consult  Lvm with call back #

## 2024-01-13 ENCOUNTER — Ambulatory Visit: Admitting: Internal Medicine

## 2024-01-13 ENCOUNTER — Other Ambulatory Visit

## 2024-01-13 DIAGNOSIS — E039 Hypothyroidism, unspecified: Secondary | ICD-10-CM

## 2024-01-13 LAB — TSH: TSH: 3.89 u[IU]/mL (ref 0.35–5.50)

## 2024-01-14 ENCOUNTER — Telehealth: Payer: Self-pay | Admitting: Internal Medicine

## 2024-01-14 ENCOUNTER — Encounter: Payer: Self-pay | Admitting: Internal Medicine

## 2024-01-14 ENCOUNTER — Ambulatory Visit: Admitting: Internal Medicine

## 2024-01-14 VITALS — BP 84/60 | HR 78 | Ht 63.0 in | Wt 120.8 lb

## 2024-01-14 DIAGNOSIS — R918 Other nonspecific abnormal finding of lung field: Secondary | ICD-10-CM | POA: Diagnosis not present

## 2024-01-14 DIAGNOSIS — R768 Other specified abnormal immunological findings in serum: Secondary | ICD-10-CM | POA: Diagnosis not present

## 2024-01-14 DIAGNOSIS — Z87891 Personal history of nicotine dependence: Secondary | ICD-10-CM | POA: Insufficient documentation

## 2024-01-14 DIAGNOSIS — D8989 Other specified disorders involving the immune mechanism, not elsewhere classified: Secondary | ICD-10-CM | POA: Diagnosis not present

## 2024-01-14 DIAGNOSIS — R053 Chronic cough: Secondary | ICD-10-CM

## 2024-01-14 DIAGNOSIS — Y93H2 Activity, gardening and landscaping: Secondary | ICD-10-CM

## 2024-01-14 NOTE — Progress Notes (Signed)
 OV 04/24/2023  Subjective:  Patient ID: Pam Salazar, female , DOB: 10/20/1945 , age 78 y.o. , MRN: [id number] , ADDRESS: [address] [place] [id number] PCP Maribeth Camellia MATSU, MD Patient Care Team: Maribeth Camellia MATSU, MD as PCP - General (Family Medicine) Dellie Louanne MATSU, MD as Consulting Physician (General Surgery) Glendia Shad, MD (Internal Medicine)  This Provider for this visit: Treatment Team:  Attending Provider: Geronimo Amel, MD    04/24/2023 -   Chief Complaint  Patient presents with   Consult    Pt is here for Consult visit.  Pt needs ILD explained to her.     HPI Pam Salazar 78 y.o. -retired Engineer, civil (consulting).  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  She is a former smoker.  She says she started smoking while she was in [place] at [facility] and then quit 2014 or so.  Review of the lung cancer screening notes indicate that she has a 23 pack smoking history and quit in 2014.  She is here because the low-dose CT scan of the chest done in August 2024 showed some reticular opacities in the lung base and therefore she has been referred here.  She says that she was in a two-story house and she only gets dyspneic when she climbs back up for the second time.'s been stable like this for 10 years.  Some 3 weeks ago she had hip surgery and therefore she is not able to mobilize well but she does not think there is any health status change.  There is no chest pain.  She has had annual low-dose CT scans of the chest and I personally visualized this and this basically reticular opacities in the lung base consistent with linear atelectasis or postinflammatory scarring.  This extends back even in the prior ER 2023.  Interstitial lung disease questionnaire done and she brought it with her - Symptoms: Mild dyspnea when climbing stairs and mild fatigue and mild amount of depression level 3.  Her daughter has been diagnosed with leukemia and she was a  healthy 78 year old  - Past medical history relevant ILD: No autoimmune disease no acid reflux no pulmonary hypertension.  She does have hypothyroidism.  She has had COVID boosters and then had COVID disease in 2024   - Review of systems positive for fatigue and arthralgia.  Also positive for dysphagia for the last several years if she eats rice.  She also has some dry eyes.  On 08/01/2023 she reported that fingers to turn blue occasionally when it is cold.   - Family history for lung disease: Mother had COPD  Personal exposure history started smoking 19 6 7  quit 20 1410 cigarettes/day.  She did smoke marijuana 1 time in 1980.  No cocaine use.   - Home and hobby details: [place] home in a single-family home 16 years house was built in 2008.  Extensive organic antigen history is negative except she does plant flowers and works with mulch and woodchips she does have 3 dogs inside but no birds   - Occupational history: She smoked and she grew up in a tobacco [place].  She worked as a Engineer, civil (consulting).  Detail organic and inorganic antigen exposure history is negative    CT Chest data from date: **  - personally visualized and independently interpreted : August 2024 personally visualized - my findings are: Postinflammatory atelectasis/linear scarring  Narrative & Impression  CLINICAL  DATA:  Former smoker with 23 pack-year history   EXAM: CT CHEST WITHOUT CONTRAST LOW-DOSE FOR LUNG CANCER SCREENING   TECHNIQUE: Multidetector CT imaging of the chest was performed following the standard protocol without IV contrast.   RADIATION DOSE REDUCTION: This exam was performed according to the departmental dose-optimization program which includes automated exposure control, adjustment of the mA and/or kV according to patient size and/or use of iterative reconstruction technique.   COMPARISON:  Chest CT dated January 30, 2022   FINDINGS: Cardiovascular: Normal heart size. No pericardial effusion.  Normal caliber thoracic aorta with moderate calcified plaque. No visible coronary artery calcifications.   Mediastinum/Nodes: Esophagus and thyroid  are unremarkable. No enlarged lymph nodes seen in the chest.   Lungs/Pleura: Central airways are patent. No consolidation, pleural effusion or pneumothorax. Mild lower lung subpleural predominant reticular opacities, unchanged when compared with the prior exam. Stable solid nodule of the right lower lobe measuring 5.2 mm on image 157.   Upper Abdomen: No acute abnormality.   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 No aggressive appearing osseous lesions.   IMPRESSION: 1. Lung-RADS 2S, benign appearance or behavior. Continue annual screening with low-dose chest CT without contrast in 12 months. S modifier for interstitial lung changes. 2. Mild lower lung subpleural predominant reticular opacities, could be chronic sequela of prior infection, although interstitial lung disease could also have this appearance. Correlate with patient's systems and consider pulmonary function tests and/or ILD protocol CT for further evaluation. 3. Aortic Atherosclerosis (ICD10-I70.0).     Electronically Signed   By: Rea Marc M.D.   On: 03/04/2023 16:56    OV 08/01/2023  Subjective:  Patient ID: Pam Salazar, female , DOB: [birth year] , age 42 y.o. , MRN: [id number] , ADDRESS: [address] [place] [phone] PCP Pam Merle, MD Patient Care Team: Pam Merle, MD as PCP - General (Family Medicine) Dellie Louanne MATSU, MD as Consulting Physician (General Surgery) Glendia Shad, MD (Internal Medicine)  This Provider for this visit: Treatment Team:  Attending Provider: Geronimo Amel, MD    08/01/2023 -   Chief Complaint  Patient presents with   Follow-up     HPI Pam Salazar 78 y.o. -returns for follow-up to discuss the test results.   Physically she is doing well.  She is not climbing stairs anymore because of her knee but there is no interim shortness of breath or worsening of symptoms.  No emergency room visits or urgent care visits or changes to medications.  Psychologically she is struggling because her daughter has AML and is awaiting a bone marrow transplant in the next couple of months and after that patient will be the caretaker for the daughter in the house.  We reviewed her interstitial lung abnormality workup.  Her ANA is trace positive ANA is trace positive.  In further questioning she reported possible/likely Raynaud's.  Although otherwise she is feeling well.  There is no sicca symptoms.  There is no kidney issues.  We went over these test results.  Pulmonary function test shows a reduction in DLCO to 57%.  Otherwise has a normal spirometry.  We did walke her 200 feet with a forehead probe today and per CMA did NOT desaturate   OV 01/14/2024  Subjective:  Patient ID: Pam Salazar, female , DOB: 1945-10-20 , age 44 y.o. , MRN: [id number] , ADDRESS: [address] [place] [phone] PCP Pam Merle, MD (Inactive) Patient Care  Team: Pam Merle, MD (Inactive) as PCP - General (Family Medicine) Dellie Louanne MATSU, MD as Consulting Physician (General Surgery) Glendia Shad, MD (Internal Medicine)  This Provider for this visit: Treatment Team:  Attending Provider: Geronimo Amel, MD    01/14/2024 -   Chief Complaint  Patient presents with   Medical Management of Chronic Issues    HRCT done 12/24/23. Cough has been getting worse over the past month- non prod for the most part but will occ see some minimal dark green sputum.      HPI Vonceil U[place] 78 y.o. -follow-up isolated reduction in DLCO in a former smoker with ANA positivity and history of Raynaud's  Presents for follow-up.  Since her last visit no major new problems.  Overall she is the same.  She did have a biopsy of a skin in  the left infraclavicular area.  Her main symptom is that she continues to have cough.  She is also taking care of her daughter with leukemia.  Review of the labs indicate - CT scan high-resolution shows fine nodularity particular in the upper lobes.  It is bilateral and diffuse.  I agree with the differential diagnosis of hypersensitive pneumonitis and also RB-ILD although she has been quit smoking for more than 11 years.  She admits to significant amount of gardening in her life and exposure to damp soil and mulch and while growing up she was exposed to tobacco farm.  -ANA and ENA are trace positive.  She has a history of Raynaud's      CT Chest data from date: *  - personally visualized and independently interpreted :  Narrative & Impression  CLINICAL DATA:  Interstitial lung disease, persistent cough, former smoker   EXAM: CT CHEST WITHOUT CONTRAST   TECHNIQUE: Multidetector CT imaging of the chest was performed following the standard protocol without intravenous contrast. High resolution imaging of the lungs, as well as inspiratory and expiratory imaging, was performed.   RADIATION DOSE REDUCTION: This exam was performed according to the departmental dose-optimization program which includes automated exposure control, adjustment of the mA and/or kV according to patient size and/or use of iterative reconstruction technique.   COMPARISON:  02/21/2023   FINDINGS: Cardiovascular: Scattered aortic atherosclerosis. Normal heart size. No pericardial effusion.   Mediastinum/Nodes: No enlarged mediastinal, hilar, or axillary lymph nodes. Thyroid  gland, trachea, and esophagus demonstrate no significant findings.   Lungs/Pleura: Very extensive centrilobular nodularity throughout the lungs. Minimal, peripheral ground-glass and irregular interstitial opacity of the lung bases with mild associated dependent varicoid bronchiectasis (series 16, image 254). No significant air  trapping on expiratory phase imaging. No pleural effusion or pneumothorax.   Upper Abdomen: No acute abnormality.   Musculoskeletal: No chest wall abnormality. No acute osseous findings.   IMPRESSION: 1. Very extensive fine centrilobular nodularity throughout the lungs, most commonly seen in smoking-related respiratory bronchiolitis. However given extensive nodularity and relatively remote smoking cessation consider other nonspecific infectious or inflammatory inhalational lung disease such as hypersensitivity pneumonitis. 2. Minimal, peripheral ground-glass and irregular interstitial opacity of the lung bases with mild associated dependent varicoid bronchiectasis, findings suggesting minimal interstitial lung disease, likely smoking-related respiratory bronchiolitis-interstitial lung disease spectrum in this setting but technically characterized as an alternative diagnosis pattern by ATS criteria.   Aortic Atherosclerosis (ICD10-I70.0).     Electronically Signed   By: Marolyn JONETTA Jaksch M.D.   On: 12/31/2023 07:23           PFT     Latest Ref Rng &  Units 12/31/2023   12:56 PM 06/26/2023    1:00 PM  PFT Results  FVC-Pre L 3.35  3.24   FVC-Predicted Pre % 134  129   FVC-Post L  3.35   FVC-Predicted Post %  134   Pre FEV1/FVC % % 76  76   Post FEV1/FCV % %  79   FEV1-Pre L 2.55  2.46   FEV1-Predicted Pre % 137  132   FEV1-Post L  2.64   DLCO uncorrected ml/min/mmHg 10.45  10.25   DLCO UNC% % 58  57   DLCO corrected ml/min/mmHg 10.45  10.25   DLCO COR %Predicted % 58  57   DLVA Predicted % 51  49   TLC L  5.41   TLC % Predicted %  113   RV % Predicted %  93        LAB RESULTS last 96 hours No results found.       has a past medical history of Arthritis, Basal cell carcinoma, Breast lump (10/21/2011), Depression, Fracture of rib of right side (08/17/2013), Hypercholesterolemia, Hypothyroidism, Low blood pressure (10/25/2014), Panic disorder, Seizures (HCC),  Shoulder pain, left, Squamous cell carcinoma of skin, Squamous cell carcinoma of skin (08/07/2022), Vertigo, and Vitamin D  deficiency.   reports that she has quit smoking. Her smoking use included cigarettes. She quit smokeless tobacco use about 10 years ago.  Past Surgical History:  Procedure Laterality Date   ABDOMINAL HYSTERECTOMY  1981   partial - secondary to bleeding, ovaries not removed -  vaginal   CATARACT EXTRACTION W/ INTRAOCULAR LENS  IMPLANT, BILATERAL     COLONOSCOPY WITH PROPOFOL  N/A 05/05/2015   Procedure: COLONOSCOPY WITH PROPOFOL ;  Surgeon: Rogelia Copping, MD;  [facility];  Service: Endoscopy;  Laterality: N/A;   COLONOSCOPY WITH PROPOFOL  N/A 06/26/2021   Procedure: COLONOSCOPY WITH PROPOFOL ;  Surgeon: Therisa Bi, MD;  Location: [facility];  Service: Gastroenterology;  Laterality: N/A;   JOINT REPLACEMENT  09/28/2020   MASS EXCISION Right 12/24/2017   Procedure: EXCISION MASS ( SHIN );  Surgeon: Jama Cordella MATSU, MD;  Location: [facility];  Service: Vascular;  Laterality: Right;   ORIF FIBULA FRACTURE Right 2003   right lower extremitiy   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   shoulder Left    bone suprs   SHOULDER SURGERY  12/2015   TONSILLECTOMY  1974   TOTAL HIP ARTHROPLASTY Right 03/2023    Allergies  Allergen Reactions   Duloxetine Other (See Comments)    duloxetine   Duloxetine Hcl Other (See Comments)    depression worsened   Ezetimibe-Simvastatin  Other (See Comments)    Joint pain   Hydroxyzine  Other (See Comments)    Caused pt to be dizzy.    Immunization History  Administered Date(s) Administered    sv, Bivalent, Protein Subunit Rsvpref,pf Mar[place]) 05/29/2022   Fluad Quad(high Dose 65+) 05/29/2022   Influenza Split 06/28/2013, 05/27/2014, 04/21/2017   Influenza, High Dose Seasonal PF 05/19/2018, 02/25/2019, 03/12/2021   Influenza-Unspecified 05/25/2015, 05/24/2017, 03/13/2020, 05/29/2022   Moderna Covid-19 Fall Seasonal  Vaccine 80yrs & older 07/09/2022   PFIZER(Purple Top)SARS-COV-2 Vaccination 08/06/2019, 08/31/2019, 03/13/2020, 11/30/2020, 03/12/2021   Pfizer Covid-19 Vaccine Bivalent Booster 80yrs & up 07/09/2021   Pneumococcal Conjugate-13 08/03/2013   Pneumococcal Polysaccharide-23 09/14/2012, 07/11/2015   Respiratory Syncytial Virus Vaccine,Recomb Aduvanted(Arexvy) 05/29/2022   Tdap 02/25/2019   Unspecified SARS-COV-2 Vaccination 03/13/2020   Zoster Recombinant(Shingrix) 07/09/2022, 10/17/2022   Zoster, Live 06/24/2008    Family History  Problem Relation  Age of Onset   Arthritis Mother    Hyperlipidemia Mother    Cancer Mother    Vision loss Mother    Arthritis Father    Prostate cancer Father    Cancer Father    Vision loss Father    Breast cancer Sister 75   Heart disease Brother        heart attack   Diabetes Brother    Cancer Brother    Breast cancer Maternal Aunt 2   Colon cancer Maternal Uncle    Leukemia Daughter        s/p stem cell transplant   Cancer Sister      Current Outpatient Medications:    busPIRone  (BUSPAR ) 7.5 MG tablet, Take 1 tablet (7.5 mg total) by mouth 3 (three) times daily., Disp: 180 tablet, Rfl: 2   clonazePAM  (KLONOPIN ) 1 MG tablet, TAKE 1 TABLET BY MOUTH 2 TIMES A DAY AS NEEDED FOR ANXIETY (Patient taking differently: Take 1 mg by mouth 2 (two) times daily.), Disp: 60 tablet, Rfl: 5   levothyroxine  (SYNTHROID ) 125 MCG tablet, TAKE 1 TABLET BY MOUTH DAILY BEFORE BREAKFAST, Disp: 90 tablet, Rfl: 1   Multiple Vitamins-Minerals (PRESERVISION AREDS 2 PO), Take 1 capsule by mouth 2 (two) times daily. , Disp: , Rfl:    Omega 3 1000 MG CAPS, Take by mouth daily., Disp: , Rfl:    sertraline  (ZOLOFT ) 100 MG tablet, Take 2 tablets (200 mg total) by mouth daily., Disp: 180 tablet, Rfl: 1   simvastatin  (ZOCOR ) 20 MG tablet, Take 1 tablet (20 mg total) by mouth every evening., Disp: 90 tablet, Rfl: 1      Objective:   Vitals:   01/14/24 1312  BP: (!) 84/60   Pulse: 78  SpO2: 97%  Weight: 120 lb 12.8 oz (54.8 kg)  Height: 5' 3 (1.6 m)    Estimated body mass index is 21.4 kg/m as calculated from the following:   Height as of this encounter: 5' 3 (1.6 m).   Weight as of this encounter: 120 lb 12.8 oz (54.8 kg).  @WEIGHTCHANGE @  Filed Weights   01/14/24 1312  Weight: 120 lb 12.8 oz (54.8 kg)     Physical Exam   General: No distress. Looks ell O2 at rest: no Cane present: no Sitting in wheel chair: no Frail: no Obese: no Neuro: Alert and Oriented x 3. GCS 15. Speech normal Psych: Pleasant Resp:  Barrel Chest - no.  Wheeze - no, Crackles - no, No overt respiratory distress CVS: Normal heart sounds. Murmurs - no Ext: Stigmata of Connective Tissue Disease - no HEENT: Normal upper airway. PEERL +. No post nasal drip        Assessment/     Assessment & Plan Interstitial lung abnormality (ILA)  Activity involving gardening or landscaping  Stopped smoking more than 10 years ago  ANA positive  Antibody to extractable nuclear antigen (ENA) positive (HCC)  Risks of pneumothorax, hemothorax, sedation/anesthesia complications such as cardiac or respiratory arrest or hypotension, stroke and bleeding all explained. Benefits of diagnosis but limitations of non-diagnosis also explained. Patient verbalized understanding and wished to proceed.    PLAN Patient Instructions     ICD-10-CM   1. Interstitial lung abnormality (ILA)  R91.8     2. Activity involving gardening or landscaping  Y93.H2     3. Stopped smoking more than 10 years ago  Z87.891     4. ANA positive  R76.8     5. Antibody to extractable  nuclear antigen (ENA) positive (HCC)  D89.89       There is definitely upper lobe bilateral interstitial lung abnormalities.  Pulmonary function test shows a mild impairment but there is no progression in 6 months.  Differential diagnosis includes smokers related interstitial lung disease [former heavy smoker] or  hypersensitive pneumonitis [gardening and tobacco farm exposure].  Autoimmune disease is another possibility given the fact you have mild ANA positive and ANA positive   Plan - Check myositis antibody panel - Schedule bronchoscopy with lavage and transbronchial biopsies  - We will try to get this done in the next 4 weeks or so  - Either myself or Dr. Lamar Chris will try to get this done  -  - Avoid gardening or mold exposure    Follow-up - Return in 12 weeks to discuss results and next steps    FOLLOWUP    Return in about 3 months (around 04/15/2024) for 15 min visit, with Dr Geronimo, Face to Face Visit.    SIGNATURE    Dr. Dorethia Geronimo, M.D., F.C.C.P,  Pulmonary and Critical Care Medicine Staff Physician, [facility] Center Director - Interstitial Lung Disease  Program  Pulmonary Fibrosis [facility] at [facility] [place], [place], [address]  Pager: [phone], If no answer or between  15:00h - 7:00h: call [phone] Telephone: [phone]  6:00 PM 01/14/2024

## 2024-01-14 NOTE — Patient Instructions (Addendum)
 ICD-10-CM   1. Interstitial lung abnormality (ILA)  R91.8     2. Activity involving gardening or landscaping  Y93.H2     3. Stopped smoking more than 10 years ago  Z87.891     4. ANA positive  R76.8     5. Antibody to extractable nuclear antigen (ENA) positive (HCC)  D89.89       There is definitely upper lobe bilateral interstitial lung abnormalities.  Pulmonary function test shows a mild impairment but there is no progression in 6 months.  Differential diagnosis includes smokers related interstitial lung disease [former heavy smoker] or hypersensitive pneumonitis [gardening and tobacco farm exposure].  Autoimmune disease is another possibility given the fact you have mild ANA positive and ANA positive   Plan - Check myositis antibody panel - Schedule bronchoscopy with lavage and transbronchial biopsies  - We will try to get this done in the next 4 weeks or so  - Either myself or Dr. Lamar Chris will try to get this done  -  - Avoid gardening or mold exposure    Follow-up - Return in 12 weeks to discuss results and next steps

## 2024-01-14 NOTE — Telephone Encounter (Signed)
    PATIENT: Pam Salazar GENDER: female MRN: [id number] DOB: 07-04-1945 ADDRESS: [address] [place] [id number]    Please schedule the following:  Diagnosis: ILD upper lob Procedure: Video bronchocoopy, flexible bronchoscopy with BAL +/- Transbronchial biopsy Envisia Classifer Transbronchial biopsy: NO Anesthesia: general anestheis Do you need Fluro? yes Size of Scope: reguar Pre-med nebulized lidocaine : no Priority: August 4th - 7th at [facility] ideally or [place] (less prefer [facility]) Dr Geronimo is at [facility] ICU OR [place] Aug 18th and 19th  Does patient have OSA? no DM? no Or Latex allergy? no Medication Restriction: npo Anticoagulate/Antiplatelet: no     MISCELLANEOUS KEY INSTRUCTIONS    Please coordinate Pre-op COVID Testing  - NOT NEEDED  Please let Dr Geronimo know via reply phone message on Epic  Thank you     Key patient medical info     Allergy History:  Allergies  Allergen Reactions   Duloxetine Other (See Comments)    duloxetine   Duloxetine Hcl Other (See Comments)    depression worsened   Ezetimibe-Simvastatin  Other (See Comments)    Joint pain   Hydroxyzine  Other (See Comments)    Caused pt to be dizzy.     Current Outpatient Medications:    busPIRone  (BUSPAR ) 7.5 MG tablet, Take 1 tablet (7.5 mg total) by mouth 3 (three) times daily., Disp: 180 tablet, Rfl: 2   clonazePAM  (KLONOPIN ) 1 MG tablet, TAKE 1 TABLET BY MOUTH 2 TIMES A DAY AS NEEDED FOR ANXIETY (Patient taking differently: Take 1 mg by mouth 2 (two) times daily.), Disp: 60 tablet, Rfl: 5   levothyroxine  (SYNTHROID ) 125 MCG tablet, TAKE 1 TABLET BY MOUTH DAILY BEFORE BREAKFAST, Disp: 90 tablet, Rfl: 1   Multiple Vitamins-Minerals (PRESERVISION AREDS 2 PO), Take 1 capsule by mouth 2 (two) times daily. , Disp: , Rfl:    Omega 3 1000 MG CAPS, Take by mouth daily., Disp: , Rfl:    sertraline  (ZOLOFT ) 100 MG tablet, Take 2 tablets (200 mg total) by mouth  daily., Disp: 180 tablet, Rfl: 1   simvastatin  (ZOCOR ) 20 MG tablet, Take 1 tablet (20 mg total) by mouth every evening., Disp: 90 tablet, Rfl: 1   has a past medical history of Arthritis, Basal cell carcinoma, Breast lump (10/21/2011), Depression, Fracture of rib of right side (08/17/2013), Hypercholesterolemia, Hypothyroidism, Low blood pressure (10/25/2014), Panic disorder, Seizures (HCC), Shoulder pain, left, Squamous cell carcinoma of skin, Squamous cell carcinoma of skin (08/07/2022), Vertigo, and Vitamin D  deficiency.    has a past surgical history that includes ORIF fibula fracture (Right, 2003); Tonsillectomy (1974); Cataract extraction w/ intraocular lens  implant, bilateral; Colonoscopy with propofol  (N/A, 05/05/2015); Shoulder surgery (12/2015); Abdominal hysterectomy (1981); shoulder (Left); Mass excision (Right, 12/24/2017); Replacement total knee (Right, 09/28/2020); Colonoscopy with propofol  (N/A, 06/26/2021); Total hip arthroplasty (Right, 03/2023); and Joint replacement (09/28/2020).   SIGNATURE    Dr. Dorethia Geronimo, M.D., F.C.C.P,  Pulmonary and Critical Care Medicine Staff Physician, [facility] Center Director - Interstitial Lung Disease  Program  Pulmonary Fibrosis [facility] at [facility] [place], [place], [address]  Pager: [phone], If no answer or between  15:00h - 7:00h: call [phone] Telephone: [phone]  1:47 PM 01/14/2024

## 2024-01-14 NOTE — H&P (View-Only) (Signed)
 OV 04/24/2023  Subjective:  Patient ID: Pam Salazar, female , DOB: 10/20/1945 , age 78 y.o. , MRN: [id number] , ADDRESS: [address] [place] [id number] PCP Maribeth Camellia MATSU, MD Patient Care Team: Maribeth Camellia MATSU, MD as PCP - General (Family Medicine) Dellie Louanne MATSU, MD as Consulting Physician (General Surgery) Glendia Shad, MD (Internal Medicine)  This Provider for this visit: Treatment Team:  Attending Provider: Geronimo Amel, MD    04/24/2023 -   Chief Complaint  Patient presents with   Consult    Pt is here for Consult visit.  Pt needs ILD explained to her.     HPI Pam Salazar 78 y.o. -retired Engineer, civil (consulting).  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  She is a former smoker.  She says she started smoking while she was in [place] at [facility] and then quit 2014 or so.  Review of the lung cancer screening notes indicate that she has a 23 pack smoking history and quit in 2014.  She is here because the low-dose CT scan of the chest done in August 2024 showed some reticular opacities in the lung base and therefore she has been referred here.  She says that she was in a two-story house and she only gets dyspneic when she climbs back up for the second time.'s been stable like this for 10 years.  Some 3 weeks ago she had hip surgery and therefore she is not able to mobilize well but she does not think there is any health status change.  There is no chest pain.  She has had annual low-dose CT scans of the chest and I personally visualized this and this basically reticular opacities in the lung base consistent with linear atelectasis or postinflammatory scarring.  This extends back even in the prior ER 2023.  Interstitial lung disease questionnaire done and she brought it with her - Symptoms: Mild dyspnea when climbing stairs and mild fatigue and mild amount of depression level 3.  Her daughter has been diagnosed with leukemia and she was a  healthy 78 year old  - Past medical history relevant ILD: No autoimmune disease no acid reflux no pulmonary hypertension.  She does have hypothyroidism.  She has had COVID boosters and then had COVID disease in 2024   - Review of systems positive for fatigue and arthralgia.  Also positive for dysphagia for the last several years if she eats rice.  She also has some dry eyes.  On 08/01/2023 she reported that fingers to turn blue occasionally when it is cold.   - Family history for lung disease: Mother had COPD  Personal exposure history started smoking 19 6 7  quit 20 1410 cigarettes/day.  She did smoke marijuana 1 time in 1980.  No cocaine use.   - Home and hobby details: [place] home in a single-family home 16 years house was built in 2008.  Extensive organic antigen history is negative except she does plant flowers and works with mulch and woodchips she does have 3 dogs inside but no birds   - Occupational history: She smoked and she grew up in a tobacco [place].  She worked as a Engineer, civil (consulting).  Detail organic and inorganic antigen exposure history is negative    CT Chest data from date: **  - personally visualized and independently interpreted : August 2024 personally visualized - my findings are: Postinflammatory atelectasis/linear scarring  Narrative & Impression  CLINICAL  DATA:  Former smoker with 23 pack-year history   EXAM: CT CHEST WITHOUT CONTRAST LOW-DOSE FOR LUNG CANCER SCREENING   TECHNIQUE: Multidetector CT imaging of the chest was performed following the standard protocol without IV contrast.   RADIATION DOSE REDUCTION: This exam was performed according to the departmental dose-optimization program which includes automated exposure control, adjustment of the mA and/or kV according to patient size and/or use of iterative reconstruction technique.   COMPARISON:  Chest CT dated January 30, 2022   FINDINGS: Cardiovascular: Normal heart size. No pericardial effusion.  Normal caliber thoracic aorta with moderate calcified plaque. No visible coronary artery calcifications.   Mediastinum/Nodes: Esophagus and thyroid  are unremarkable. No enlarged lymph nodes seen in the chest.   Lungs/Pleura: Central airways are patent. No consolidation, pleural effusion or pneumothorax. Mild lower lung subpleural predominant reticular opacities, unchanged when compared with the prior exam. Stable solid nodule of the right lower lobe measuring 5.2 mm on image 157.   Upper Abdomen: No acute abnormality.   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 No aggressive appearing osseous lesions.   IMPRESSION: 1. Lung-RADS 2S, benign appearance or behavior. Continue annual screening with low-dose chest CT without contrast in 12 months. S modifier for interstitial lung changes. 2. Mild lower lung subpleural predominant reticular opacities, could be chronic sequela of prior infection, although interstitial lung disease could also have this appearance. Correlate with patient's systems and consider pulmonary function tests and/or ILD protocol CT for further evaluation. 3. Aortic Atherosclerosis (ICD10-I70.0).     Electronically Signed   By: Rea Marc M.D.   On: 03/04/2023 16:56    OV 08/01/2023  Subjective:  Patient ID: Pam Salazar, female , DOB: [birth year] , age 78 y.o. , MRN: [id number] , ADDRESS: [address] [place] [id number] PCP Hope Merle, MD Patient Care Team: Hope Merle, MD as PCP - General (Family Medicine) Dellie Louanne MATSU, MD as Consulting Physician (General Surgery) Glendia Shad, MD (Internal Medicine)  This Provider for this visit: Treatment Team:  Attending Provider: Geronimo Amel, MD    08/01/2023 -   Chief Complaint  Patient presents with   Follow-up     HPI Pam Salazar 78 y.o. -returns for follow-up to discuss the test results.   Physically she is doing well.  She is not climbing stairs anymore because of her knee but there is no interim shortness of breath or worsening of symptoms.  No emergency room visits or urgent care visits or changes to medications.  Psychologically she is struggling because her daughter has AML and is awaiting a bone marrow transplant in the next couple of months and after that patient will be the caretaker for the daughter in the house.  We reviewed her interstitial lung abnormality workup.  Her ANA is trace positive ANA is trace positive.  In further questioning she reported possible/likely Raynaud's.  Although otherwise she is feeling well.  There is no sicca symptoms.  There is no kidney issues.  We went over these test results.  Pulmonary function test shows a reduction in DLCO to 57%.  Otherwise has a normal spirometry.  We did walke her 200 feet with a forehead probe today and per CMA did NOT desaturate   OV 01/14/2024  Subjective:  Patient ID: Pam Salazar, female , DOB: 1945-10-20 , age 44 y.o. , MRN: [id number] , ADDRESS: [address] [place] [phone] PCP Hope Merle, MD (Inactive) Patient Care  Team: Hope Merle, MD (Inactive) as PCP - General (Family Medicine) Dellie Louanne MATSU, MD as Consulting Physician (General Surgery) Glendia Shad, MD (Internal Medicine)  This Provider for this visit: Treatment Team:  Attending Provider: Geronimo Amel, MD    01/14/2024 -   Chief Complaint  Patient presents with   Medical Management of Chronic Issues    HRCT done 12/24/23. Cough has been getting worse over the past month- non prod for the most part but will occ see some minimal dark green sputum.      HPI Vonceil U[place] 78 y.o. -follow-up isolated reduction in DLCO in a former smoker with ANA positivity and history of Raynaud's  Presents for follow-up.  Since her last visit no major new problems.  Overall she is the same.  She did have a biopsy of a skin in  the left infraclavicular area.  Her main symptom is that she continues to have cough.  She is also taking care of her daughter with leukemia.  Review of the labs indicate - CT scan high-resolution shows fine nodularity particular in the upper lobes.  It is bilateral and diffuse.  I agree with the differential diagnosis of hypersensitive pneumonitis and also RB-ILD although she has been quit smoking for more than 11 years.  She admits to significant amount of gardening in her life and exposure to damp soil and mulch and while growing up she was exposed to tobacco farm.  -ANA and ENA are trace positive.  She has a history of Raynaud's      CT Chest data from date: *  - personally visualized and independently interpreted :  Narrative & Impression  CLINICAL DATA:  Interstitial lung disease, persistent cough, former smoker   EXAM: CT CHEST WITHOUT CONTRAST   TECHNIQUE: Multidetector CT imaging of the chest was performed following the standard protocol without intravenous contrast. High resolution imaging of the lungs, as well as inspiratory and expiratory imaging, was performed.   RADIATION DOSE REDUCTION: This exam was performed according to the departmental dose-optimization program which includes automated exposure control, adjustment of the mA and/or kV according to patient size and/or use of iterative reconstruction technique.   COMPARISON:  02/21/2023   FINDINGS: Cardiovascular: Scattered aortic atherosclerosis. Normal heart size. No pericardial effusion.   Mediastinum/Nodes: No enlarged mediastinal, hilar, or axillary lymph nodes. Thyroid  gland, trachea, and esophagus demonstrate no significant findings.   Lungs/Pleura: Very extensive centrilobular nodularity throughout the lungs. Minimal, peripheral ground-glass and irregular interstitial opacity of the lung bases with mild associated dependent varicoid bronchiectasis (series 16, image 254). No significant air  trapping on expiratory phase imaging. No pleural effusion or pneumothorax.   Upper Abdomen: No acute abnormality.   Musculoskeletal: No chest wall abnormality. No acute osseous findings.   IMPRESSION: 1. Very extensive fine centrilobular nodularity throughout the lungs, most commonly seen in smoking-related respiratory bronchiolitis. However given extensive nodularity and relatively remote smoking cessation consider other nonspecific infectious or inflammatory inhalational lung disease such as hypersensitivity pneumonitis. 2. Minimal, peripheral ground-glass and irregular interstitial opacity of the lung bases with mild associated dependent varicoid bronchiectasis, findings suggesting minimal interstitial lung disease, likely smoking-related respiratory bronchiolitis-interstitial lung disease spectrum in this setting but technically characterized as an alternative diagnosis pattern by ATS criteria.   Aortic Atherosclerosis (ICD10-I70.0).     Electronically Signed   By: Marolyn JONETTA Jaksch M.D.   On: 12/31/2023 07:23           PFT     Latest Ref Rng &  Units 12/31/2023   12:56 PM 06/26/2023    1:00 PM  PFT Results  FVC-Pre L 3.35  3.24   FVC-Predicted Pre % 134  129   FVC-Post L  3.35   FVC-Predicted Post %  134   Pre FEV1/FVC % % 76  76   Post FEV1/FCV % %  79   FEV1-Pre L 2.55  2.46   FEV1-Predicted Pre % 137  132   FEV1-Post L  2.64   DLCO uncorrected ml/min/mmHg 10.45  10.25   DLCO UNC% % 58  57   DLCO corrected ml/min/mmHg 10.45  10.25   DLCO COR %Predicted % 58  57   DLVA Predicted % 51  49   TLC L  5.41   TLC % Predicted %  113   RV % Predicted %  93        LAB RESULTS last 96 hours No results found.       has a past medical history of Arthritis, Basal cell carcinoma, Breast lump (10/21/2011), Depression, Fracture of rib of right side (08/17/2013), Hypercholesterolemia, Hypothyroidism, Low blood pressure (10/25/2014), Panic disorder, Seizures (HCC),  Shoulder pain, left, Squamous cell carcinoma of skin, Squamous cell carcinoma of skin (08/07/2022), Vertigo, and Vitamin D  deficiency.   reports that she has quit smoking. Her smoking use included cigarettes. She quit smokeless tobacco use about 10 years ago.  Past Surgical History:  Procedure Laterality Date   ABDOMINAL HYSTERECTOMY  1981   partial - secondary to bleeding, ovaries not removed -  vaginal   CATARACT EXTRACTION W/ INTRAOCULAR LENS  IMPLANT, BILATERAL     COLONOSCOPY WITH PROPOFOL  N/A 05/05/2015   Procedure: COLONOSCOPY WITH PROPOFOL ;  Surgeon: Rogelia Copping, MD;  [facility];  Service: Endoscopy;  Laterality: N/A;   COLONOSCOPY WITH PROPOFOL  N/A 06/26/2021   Procedure: COLONOSCOPY WITH PROPOFOL ;  Surgeon: Therisa Bi, MD;  Location: [facility];  Service: Gastroenterology;  Laterality: N/A;   JOINT REPLACEMENT  09/28/2020   MASS EXCISION Right 12/24/2017   Procedure: EXCISION MASS ( SHIN );  Surgeon: Jama Cordella MATSU, MD;  Location: [facility];  Service: Vascular;  Laterality: Right;   ORIF FIBULA FRACTURE Right 2003   right lower extremitiy   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   shoulder Left    bone suprs   SHOULDER SURGERY  12/2015   TONSILLECTOMY  1974   TOTAL HIP ARTHROPLASTY Right 03/2023    Allergies  Allergen Reactions   Duloxetine Other (See Comments)    duloxetine   Duloxetine Hcl Other (See Comments)    depression worsened   Ezetimibe-Simvastatin  Other (See Comments)    Joint pain   Hydroxyzine  Other (See Comments)    Caused pt to be dizzy.    Immunization History  Administered Date(s) Administered    sv, Bivalent, Protein Subunit Rsvpref,pf [place]) 05/29/2022   Fluad Quad(high Dose 65+) 05/29/2022   Influenza Split 06/28/2013, 05/27/2014, 04/21/2017   Influenza, High Dose Seasonal PF 05/19/2018, 02/25/2019, 03/12/2021   Influenza-Unspecified 05/25/2015, 05/24/2017, 03/13/2020, 05/29/2022   Moderna Covid-19 Fall Seasonal  Vaccine 80yrs & older 07/09/2022   PFIZER(Purple Top)SARS-COV-2 Vaccination 08/06/2019, 08/31/2019, 03/13/2020, 11/30/2020, 03/12/2021   Pfizer Covid-19 Vaccine Bivalent Booster 80yrs & up 07/09/2021   Pneumococcal Conjugate-13 08/03/2013   Pneumococcal Polysaccharide-23 09/14/2012, 07/11/2015   Respiratory Syncytial Virus Vaccine,Recomb Aduvanted(Arexvy) 05/29/2022   Tdap 02/25/2019   Unspecified SARS-COV-2 Vaccination 03/13/2020   Zoster Recombinant(Shingrix) 07/09/2022, 10/17/2022   Zoster, Live 06/24/2008    Family History  Problem Relation  Age of Onset   Arthritis Mother    Hyperlipidemia Mother    Cancer Mother    Vision loss Mother    Arthritis Father    Prostate cancer Father    Cancer Father    Vision loss Father    Breast cancer Sister 75   Heart disease Brother        heart attack   Diabetes Brother    Cancer Brother    Breast cancer Maternal Aunt 2   Colon cancer Maternal Uncle    Leukemia Daughter        s/p stem cell transplant   Cancer Sister      Current Outpatient Medications:    busPIRone  (BUSPAR ) 7.5 MG tablet, Take 1 tablet (7.5 mg total) by mouth 3 (three) times daily., Disp: 180 tablet, Rfl: 2   clonazePAM  (KLONOPIN ) 1 MG tablet, TAKE 1 TABLET BY MOUTH 2 TIMES A DAY AS NEEDED FOR ANXIETY (Patient taking differently: Take 1 mg by mouth 2 (two) times daily.), Disp: 60 tablet, Rfl: 5   levothyroxine  (SYNTHROID ) 125 MCG tablet, TAKE 1 TABLET BY MOUTH DAILY BEFORE BREAKFAST, Disp: 90 tablet, Rfl: 1   Multiple Vitamins-Minerals (PRESERVISION AREDS 2 PO), Take 1 capsule by mouth 2 (two) times daily. , Disp: , Rfl:    Omega 3 1000 MG CAPS, Take by mouth daily., Disp: , Rfl:    sertraline  (ZOLOFT ) 100 MG tablet, Take 2 tablets (200 mg total) by mouth daily., Disp: 180 tablet, Rfl: 1   simvastatin  (ZOCOR ) 20 MG tablet, Take 1 tablet (20 mg total) by mouth every evening., Disp: 90 tablet, Rfl: 1      Objective:   Vitals:   01/14/24 1312  BP: (!) 84/60   Pulse: 78  SpO2: 97%  Weight: 120 lb 12.8 oz (54.8 kg)  Height: 5' 3 (1.6 m)    Estimated body mass index is 21.4 kg/m as calculated from the following:   Height as of this encounter: 5' 3 (1.6 m).   Weight as of this encounter: 120 lb 12.8 oz (54.8 kg).  @WEIGHTCHANGE @  Filed Weights   01/14/24 1312  Weight: 120 lb 12.8 oz (54.8 kg)     Physical Exam   General: No distress. Looks ell O2 at rest: no Cane present: no Sitting in wheel chair: no Frail: no Obese: no Neuro: Alert and Oriented x 3. GCS 15. Speech normal Psych: Pleasant Resp:  Barrel Chest - no.  Wheeze - no, Crackles - no, No overt respiratory distress CVS: Normal heart sounds. Murmurs - no Ext: Stigmata of Connective Tissue Disease - no HEENT: Normal upper airway. PEERL +. No post nasal drip        Assessment/     Assessment & Plan Interstitial lung abnormality (ILA)  Activity involving gardening or landscaping  Stopped smoking more than 10 years ago  ANA positive  Antibody to extractable nuclear antigen (ENA) positive (HCC)  Risks of pneumothorax, hemothorax, sedation/anesthesia complications such as cardiac or respiratory arrest or hypotension, stroke and bleeding all explained. Benefits of diagnosis but limitations of non-diagnosis also explained. Patient verbalized understanding and wished to proceed.    PLAN Patient Instructions     ICD-10-CM   1. Interstitial lung abnormality (ILA)  R91.8     2. Activity involving gardening or landscaping  Y93.H2     3. Stopped smoking more than 10 years ago  Z87.891     4. ANA positive  R76.8     5. Antibody to extractable  nuclear antigen (ENA) positive (HCC)  D89.89       There is definitely upper lobe bilateral interstitial lung abnormalities.  Pulmonary function test shows a mild impairment but there is no progression in 6 months.  Differential diagnosis includes smokers related interstitial lung disease [former heavy smoker] or  hypersensitive pneumonitis [gardening and tobacco farm exposure].  Autoimmune disease is another possibility given the fact you have mild ANA positive and ANA positive   Plan - Check myositis antibody panel - Schedule bronchoscopy with lavage and transbronchial biopsies  - We will try to get this done in the next 4 weeks or so  - Either myself or Dr. Lamar Chris will try to get this done  -  - Avoid gardening or mold exposure    Follow-up - Return in 12 weeks to discuss results and next steps    FOLLOWUP    Return in about 3 months (around 04/15/2024) for 15 min visit, with Dr Geronimo, Face to Face Visit.    SIGNATURE    Dr. Dorethia Geronimo, M.D., F.C.C.P,  Pulmonary and Critical Care Medicine Staff Physician, [facility] Director - Interstitial Lung Disease  Program  Pulmonary Fibrosis [facility] at [facility] [place], [place], [address]  Pager: [phone], If no answer or between  15:00h - 7:00h: call [phone] Telephone: [phone]  6:00 PM 01/14/2024

## 2024-01-15 ENCOUNTER — Ambulatory Visit: Admission: EM | Admit: 2024-01-15 | Discharge: 2024-01-15 | Disposition: A | Discharge status: Home / Self Care

## 2024-01-15 ENCOUNTER — Ambulatory Visit: Payer: Self-pay

## 2024-01-15 DIAGNOSIS — J069 Acute upper respiratory infection, unspecified: Secondary | ICD-10-CM | POA: Diagnosis not present

## 2024-01-15 NOTE — ED Triage Notes (Signed)
 Patient presents to [facility] for facial pressure and HA since Monday. Treating with mucinex . Hx of sinus infections.

## 2024-01-15 NOTE — Telephone Encounter (Signed)
 FYI Only or Action Required?: FYI only for provider.  Patient was last seen in primary care on 12/03/2023 by Dineen Rollene MATSU, FNP.  Called Nurse Triage reporting Sinusitis.  Symptoms began several days ago.  Interventions attempted: OTC medications: Muccinex extra strength.  Symptoms are: sinus pain, nasal drainage, chills, occasional cough gradually worsening.  Triage Disposition: See HCP Within 4 Hours (Or PCP Triage)  Patient/caregiver understands and will follow disposition?: Yes               Copied from CRM #[id number]. Topic: Clinical - Red Word Triage >> Jan 15, 2024 10:07 AM Mesmerise C wrote: [facility] that prompted transfer to Nurse Triage: Patient stated she woke up with a sinus pressure, runny nose with headache with pain felt better by Tuesday with the runny nose, says she woke up and her head was hurting to even move Reason for Disposition  [1] SEVERE sinus pain (e.g., excruciating) AND [2] not improved 2 hours after pain medicine  Answer Assessment - Initial Assessment Questions 1. LOCATION: Where does it hurt?      My eyeballs hurt, under them is tender. Across forehead, all the way around  2. ONSET: When did the sinus pain start?  (e.g., hours, days)      Since Monday.  3. SEVERITY: How bad is the pain?   (Scale 0-10; or none, mild, moderate or severe)     3/10 at rest, if bending over it goes to 10/10 pressure comes forward.  4. RECURRENT SYMPTOM: Have you ever had sinus problems before? If Yes, ask: When was the last time? and What happened that time?      No, patient states she has never had a sinus infection before.  5. NASAL CONGESTION: Is the nose blocked? If Yes, ask: Can you open it or must you breathe through your mouth?     No.  6. NASAL DISCHARGE: Do you have discharge from your nose? If so ask, What color?     Yes, clear mucus and she states it runs like a faucet.  7. FEVER: Do you have a fever? If Yes, ask:  What is it, how was it measured, and when did it start?      No.  8. OTHER SYMPTOMS: Do you have any other symptoms? (e.g., sore throat, cough, earache, difficulty breathing)     Chills (on Monday), occasional cough x 1+year. Patient denies sore throat, earaches.  9. PREGNANCY: Is there any chance you are pregnant? When was your last menstrual period?     N/A.  Protocols used: Sinus Pain or Congestion-A-AH

## 2024-01-15 NOTE — Discharge Instructions (Signed)
Most likely you have a viral illness: no antibiotic is indicated at this time, May treat with OTC meds of choice. Make sure to drink plenty of fluids to stay hydrated(gatorade, water, popsicles,jello,etc), avoid caffeine products. Follow up with PCP. Return as needed.

## 2024-01-15 NOTE — ED Provider Notes (Signed)
 [facility]    CSN: [id number] Arrival date & time: 01/15/24  1120      History   Chief Complaint Chief Complaint  Patient presents with   Facial Pain    HPI Pam Salazar is a 78 y.o. female.   78 year old female, Pam Salazar, presents to urgent care for evaluation of intermittent facial pressure and headache since Monday.  Patient states she has been treating her symptoms with Mucinex  with tylenol  combo med. No known illness exposure, afebrile.  Pt states she had an appt with her PCP Tuesday, but was feeling better so she cancelled her appt, went to see her lung doctor yesterday, requesting antibiotic as daughter has leukemia and I need to knock this out.   PMH: Sinus infections, former smoker, hypothyroidism, high cholesterol,interstitial lung disease  The history is provided by the patient. No language interpreter was used.    Past Medical History:  Diagnosis Date   Arthritis    hips, lower back   Basal cell carcinoma    Breast lump 10/21/2011   Overview:  Negative diagnostic mammogram 09/2011    Depression    Fracture of rib of right side 08/17/2013   Hypercholesterolemia    Hypothyroidism    Low blood pressure 10/25/2014   Panic disorder    previous agoraphobia   Seizures (HCC)    febrile - as infant   Shoulder pain, left    bursa   Squamous cell carcinoma of skin    Squamous cell carcinoma of skin 08/07/2022   L lateral lower leg, EDC sched 09/04/22 at 11:45 AM   Vertigo    positional - rare   Vitamin D  deficiency     Patient Active Problem List   Diagnosis Date Noted   Interstitial lung abnormality (ILA) 01/14/2024   Quit smoking 01/14/2024   Osteoporosis 11/17/2023   Onychomycosis 07/15/2023   Controlled substance agreement signed 07/15/2023   Mood disorder (HCC) 07/15/2023   Lesion of bone of right shoulder 06/11/2023   Osteoarthritis of right hip 03/31/2023   History of cigarette smoking 01/31/2023   Left forearm pain  12/25/2022   Tendinitis 11/18/2022   Viral URI 10/21/2022   Constipation 09/24/2022   Dysphagia 09/24/2022   Chronic cough 01/28/2022   Allergic rhinitis 12/24/2021   Balance disorder 06/05/2020   Arthritis 01/15/2019   Menopausal vaginal dryness 05/09/2017   Dyspareunia in female 05/09/2017   Angiodysplasia of intestine with hemorrhage    Idiopathic colitis    Insomnia 09/26/2014   Anxiety and depression 03/07/2013   Hypercholesterolemia 03/07/2013   Hypothyroidism 03/07/2013   Vitamin D  deficiency 03/07/2013   Skin cancer 03/07/2013   Nicotine dependence 06/10/2012   Left breast mass 10/21/2011    Past Surgical History:  Procedure Laterality Date   ABDOMINAL HYSTERECTOMY  1981   partial - secondary to bleeding, ovaries not removed -  vaginal   CATARACT EXTRACTION W/ INTRAOCULAR LENS  IMPLANT, BILATERAL     COLONOSCOPY WITH PROPOFOL  N/A 05/05/2015   Procedure: COLONOSCOPY WITH PROPOFOL ;  Surgeon: Rogelia Copping, MD;  Location: [facility];  Service: Endoscopy;  Laterality: N/A;   COLONOSCOPY WITH PROPOFOL  N/A 06/26/2021   Procedure: COLONOSCOPY WITH PROPOFOL ;  Surgeon: Therisa Bi, MD;  Location: [facility];  Service: Gastroenterology;  Laterality: N/A;   JOINT REPLACEMENT  09/28/2020   MASS EXCISION Right 12/24/2017   Procedure: EXCISION MASS ( SHIN );  Surgeon: Jama Cordella MATSU, MD;  Location: [facility];  Service: Vascular;  Laterality: Right;  ORIF FIBULA FRACTURE Right 2003   right lower extremitiy   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   shoulder Left    bone suprs   SHOULDER SURGERY  12/2015   TONSILLECTOMY  1974   TOTAL HIP ARTHROPLASTY Right 03/2023    OB History     Gravida  3   Para  3   Term  2   Preterm  1   AB  0   Living  2      SAB  0   IAB      Ectopic      Multiple      Live Births  2            Home Medications    Prior to Admission medications   Medication Sig Start Date End Date Taking? Authorizing  Provider  busPIRone  (BUSPAR ) 7.5 MG tablet Take 1 tablet (7.5 mg total) by mouth 3 (three) times daily. 11/11/23   Hope Merle, MD  clonazePAM  (KLONOPIN ) 1 MG tablet TAKE 1 TABLET BY MOUTH 2 TIMES A DAY AS NEEDED FOR ANXIETY Patient taking differently: Take 1 mg by mouth 2 (two) times daily. 09/05/23   Hope Merle, MD  levothyroxine  (SYNTHROID ) 125 MCG tablet TAKE 1 TABLET BY MOUTH DAILY BEFORE BREAKFAST 08/25/23   Hope Merle, MD  Multiple Vitamins-Minerals (PRESERVISION AREDS 2 PO) Take 1 capsule by mouth 2 (two) times daily.     [provider]  Omega 3 1000 MG CAPS Take by mouth daily.    [provider]  sertraline  (ZOLOFT ) 100 MG tablet Take 2 tablets (200 mg total) by mouth daily. 11/11/23   Hope Merle, MD  simvastatin  (ZOCOR ) 20 MG tablet Take 1 tablet (20 mg total) by mouth every evening. 11/11/23   Hope Merle, MD    Family History Family History  Problem Relation Age of Onset   Arthritis Mother    Hyperlipidemia Mother    Cancer Mother    Vision loss Mother    Arthritis Father    Prostate cancer Father    Cancer Father    Vision loss Father    Breast cancer Sister 94   Heart disease Brother        heart attack   Diabetes Brother    Cancer Brother    Breast cancer Maternal Aunt 58   Colon cancer Maternal Uncle    Leukemia Daughter        s/p stem cell transplant   Cancer Sister     Social History Social History   Tobacco Use   Smoking status: Former    Types: Cigarettes   Smokeless tobacco: Former    Quit date: 07/26/2013   Tobacco comments:       Vaping Use   Vaping status: Never Used  Substance Use Topics   Alcohol use: No    Alcohol/week: 0.0 standard drinks of alcohol   Drug use: No     Allergies   Duloxetine, Duloxetine hcl, Ezetimibe-simvastatin , and Hydroxyzine    Review of Systems Review of Systems  Constitutional:  Negative for fever.  HENT:  Positive for sinus pressure.   Respiratory:  Negative for cough.   Neurological:   Positive for headaches.  All other systems reviewed and are negative.    Physical Exam Triage Vital Signs ED Triage Vitals  Encounter Vitals Group     BP 01/15/24 1147 (!) 111/99     Girls Systolic BP Percentile --      Girls Diastolic BP Percentile --  Boys Systolic BP Percentile --      Boys Diastolic BP Percentile --      Pulse Rate 01/15/24 1147 77     Resp 01/15/24 1147 16     Temp 01/15/24 1147 98.1 F (36.7 C)     Temp Source 01/15/24 1147 Oral     SpO2 01/15/24 1147 97 %     Weight --      Height --      Head Circumference --      Peak Flow --      Pain Score 01/15/24 1146 8     Pain Loc --      Pain Education --      Exclude from Growth Chart --    No data found.  Updated Vital Signs BP (!) 111/99 (BP Location: Right Arm)   Pulse 77   Temp 98.1 F (36.7 C) (Oral)   Resp 16   SpO2 97%   Visual Acuity Right Eye Distance:   Left Eye Distance:   Bilateral Distance:    Right Eye Near:   Left Eye Near:    Bilateral Near:     Physical Exam Vitals and nursing note reviewed.  Constitutional:      Appearance: She is well-developed and well-groomed.  HENT:     Head: Normocephalic.     Right Ear: Tympanic membrane is retracted.     Left Ear: Tympanic membrane is retracted.     Nose: Congestion present.     Right Sinus: No maxillary sinus tenderness or frontal sinus tenderness.     Left Sinus: No maxillary sinus tenderness or frontal sinus tenderness.     Mouth/Throat:     Lips: Pink.     Mouth: Mucous membranes are moist.     Pharynx: Oropharynx is clear. Uvula midline. Postnasal drip present.  Cardiovascular:     Rate and Rhythm: Normal rate and regular rhythm.     Heart sounds: Normal heart sounds.  Pulmonary:     Effort: Pulmonary effort is normal.     Breath sounds: Normal breath sounds and air entry.  Neurological:     General: No focal deficit present.     Mental Status: She is alert and oriented to person, place, and time.     GCS: GCS  eye subscore is 4. GCS verbal subscore is 5. GCS motor subscore is 6.  Psychiatric:        Attention and Perception: Attention normal.        Mood and Affect: Mood is anxious.        Speech: Speech normal.      UC Treatments / Results  Labs (all labs ordered are listed, but only abnormal results are displayed) Labs Reviewed - No data to display  EKG   Radiology No results found.  Procedures Procedures (including critical care time)  Medications Ordered in UC Medications - No data to display  Initial Impression / Assessment and Plan / UC Course  I have reviewed the triage vital signs and the nursing notes.  Pertinent labs & imaging results that were available during my care of the patient were reviewed by me and considered in my medical decision making (see chart for details).  Clinical Course as of 01/15/24 2059  Thu Jan 15, 2024  1219 After exam pt requesting cough med, offered tessalon, pt became upset and left stating, those are like water , pt offered discharge instructions, recommend flonase ,allergy med, follow up with PCP next week if symptoms  worsen, pt declined printed instructions,received verbal d/c instructions.  [JD]    Clinical Course User Index [JD] Jayana Kotula, Rilla, NP   Discussed exam findings and plan of care with patient, strict go to ER precautions given.   Patient verbalized understanding to this provider.  Ddx: Viral URI, allergies, anxiety,sinusitis Final Clinical Impressions(s) / UC Diagnoses   Final diagnoses:  Viral URI     Discharge Instructions      Most likely you have a viral illness: no antibiotic is indicated at this time, May treat with OTC meds of choice. Make sure to drink plenty of fluids to stay hydrated(gatorade, water , popsicles,jello,etc), avoid caffeine products. Follow up with PCP. Return as needed.     ED Prescriptions   None    PDMP not reviewed this encounter.   Aminta Rilla, NP 01/15/24 2100

## 2024-01-16 ENCOUNTER — Encounter: Payer: Self-pay | Admitting: Internal Medicine

## 2024-01-16 NOTE — Telephone Encounter (Signed)
Seen at [facility] yesterday.

## 2024-01-16 NOTE — Telephone Encounter (Signed)
 Patient Scheduled at [facility] at 1:30pm for Bronchoscopy. Routing to [employer] to [employer]

## 2024-01-19 ENCOUNTER — Telehealth: Payer: Self-pay | Admitting: Internal Medicine

## 2024-01-19 DIAGNOSIS — D8989 Other specified disorders involving the immune mechanism, not elsewhere classified: Secondary | ICD-10-CM

## 2024-01-19 DIAGNOSIS — Y93H2 Activity, gardening and landscaping: Secondary | ICD-10-CM

## 2024-01-19 DIAGNOSIS — R768 Other specified abnormal immunological findings in serum: Secondary | ICD-10-CM

## 2024-01-19 DIAGNOSIS — R918 Other nonspecific abnormal finding of lung field: Secondary | ICD-10-CM

## 2024-01-19 DIAGNOSIS — Z87891 Personal history of nicotine dependence: Secondary | ICD-10-CM

## 2024-01-19 NOTE — Addendum Note (Signed)
 Addended by: Sibley Rolison M on: 01/19/2024 01:56 PM   Modules accepted: Orders

## 2024-01-19 NOTE — Telephone Encounter (Signed)
 Provided bronchoscopy info. Patient would like a call to ask about lab location and if it can be done at [facility].

## 2024-01-19 NOTE — Telephone Encounter (Signed)
 Sent the pt msg via [employer]

## 2024-01-23 NOTE — Telephone Encounter (Signed)
 [place] what day?>

## 2024-01-28 ENCOUNTER — Telehealth: Payer: Self-pay | Admitting: *Deleted

## 2024-01-28 NOTE — Telephone Encounter (Signed)
 Lab orders faxed. Patient notified.

## 2024-01-29 ENCOUNTER — Other Ambulatory Visit
Admission: RE | Admit: 2024-01-29 | Discharge: 2024-01-29 | Disposition: A | Discharge status: Home / Self Care | Attending: Internal Medicine | Admitting: Internal Medicine

## 2024-01-29 DIAGNOSIS — R768 Other specified abnormal immunological findings in serum: Secondary | ICD-10-CM | POA: Diagnosis not present

## 2024-01-29 DIAGNOSIS — D8989 Other specified disorders involving the immune mechanism, not elsewhere classified: Secondary | ICD-10-CM | POA: Diagnosis not present

## 2024-01-29 DIAGNOSIS — Y93H2 Activity, gardening and landscaping: Secondary | ICD-10-CM | POA: Diagnosis not present

## 2024-01-29 DIAGNOSIS — Z87891 Personal history of nicotine dependence: Secondary | ICD-10-CM | POA: Diagnosis not present

## 2024-01-29 DIAGNOSIS — R918 Other nonspecific abnormal finding of lung field: Secondary | ICD-10-CM | POA: Insufficient documentation

## 2024-02-05 ENCOUNTER — Encounter (HOSPITAL_COMMUNITY): Payer: Self-pay | Admitting: Internal Medicine

## 2024-02-05 NOTE — Progress Notes (Signed)
 Attempted to obtain medical history for pre op call via telephone, unable to reach at this time. HIPAA compliant voicemail message left on husbands number requesting return call to pre surgical testing department.

## 2024-02-10 ENCOUNTER — Telehealth: Payer: Self-pay | Admitting: Internal Medicine

## 2024-02-10 ENCOUNTER — Ambulatory Visit (HOSPITAL_COMMUNITY)

## 2024-02-10 ENCOUNTER — Encounter: Payer: Self-pay | Admitting: Internal Medicine

## 2024-02-10 ENCOUNTER — Encounter (HOSPITAL_COMMUNITY): Payer: Self-pay | Admitting: Internal Medicine

## 2024-02-10 ENCOUNTER — Ambulatory Visit (HOSPITAL_COMMUNITY): Admitting: Anesthesiology

## 2024-02-10 ENCOUNTER — Encounter (HOSPITAL_COMMUNITY)
Admission: RE | Disposition: A | Discharge status: Home / Self Care | Payer: Self-pay | Source: Home / Self Care | Attending: Internal Medicine

## 2024-02-10 ENCOUNTER — Ambulatory Visit (HOSPITAL_BASED_OUTPATIENT_CLINIC_OR_DEPARTMENT_OTHER): Admitting: Anesthesiology

## 2024-02-10 ENCOUNTER — Ambulatory Visit (HOSPITAL_COMMUNITY)
Admission: RE | Admit: 2024-02-10 | Discharge: 2024-02-10 | Disposition: A | Discharge status: Home / Self Care | Attending: Internal Medicine | Admitting: Internal Medicine

## 2024-02-10 ENCOUNTER — Other Ambulatory Visit: Payer: Self-pay

## 2024-02-10 DIAGNOSIS — F32A Depression, unspecified: Secondary | ICD-10-CM | POA: Diagnosis not present

## 2024-02-10 DIAGNOSIS — J849 Interstitial pulmonary disease, unspecified: Secondary | ICD-10-CM | POA: Diagnosis not present

## 2024-02-10 DIAGNOSIS — E7800 Pure hypercholesterolemia, unspecified: Secondary | ICD-10-CM | POA: Diagnosis not present

## 2024-02-10 DIAGNOSIS — D8989 Other specified disorders involving the immune mechanism, not elsewhere classified: Secondary | ICD-10-CM | POA: Diagnosis not present

## 2024-02-10 DIAGNOSIS — J8410 Pulmonary fibrosis, unspecified: Secondary | ICD-10-CM | POA: Insufficient documentation

## 2024-02-10 DIAGNOSIS — Z48813 Encounter for surgical aftercare following surgery on the respiratory system: Secondary | ICD-10-CM | POA: Diagnosis not present

## 2024-02-10 DIAGNOSIS — E039 Hypothyroidism, unspecified: Secondary | ICD-10-CM | POA: Insufficient documentation

## 2024-02-10 DIAGNOSIS — I7300 Raynaud's syndrome without gangrene: Secondary | ICD-10-CM | POA: Insufficient documentation

## 2024-02-10 DIAGNOSIS — Z87891 Personal history of nicotine dependence: Secondary | ICD-10-CM | POA: Diagnosis not present

## 2024-02-10 DIAGNOSIS — R569 Unspecified convulsions: Secondary | ICD-10-CM | POA: Diagnosis not present

## 2024-02-10 DIAGNOSIS — F410 Panic disorder [episodic paroxysmal anxiety] without agoraphobia: Secondary | ICD-10-CM | POA: Insufficient documentation

## 2024-02-10 DIAGNOSIS — R768 Other specified abnormal immunological findings in serum: Secondary | ICD-10-CM | POA: Diagnosis not present

## 2024-02-10 DIAGNOSIS — R053 Chronic cough: Secondary | ICD-10-CM | POA: Diagnosis present

## 2024-02-10 DIAGNOSIS — R918 Other nonspecific abnormal finding of lung field: Secondary | ICD-10-CM | POA: Diagnosis not present

## 2024-02-10 DIAGNOSIS — R911 Solitary pulmonary nodule: Secondary | ICD-10-CM | POA: Diagnosis not present

## 2024-02-10 DIAGNOSIS — J984 Other disorders of lung: Secondary | ICD-10-CM | POA: Diagnosis not present

## 2024-02-10 DIAGNOSIS — Z8616 Personal history of COVID-19: Secondary | ICD-10-CM | POA: Diagnosis not present

## 2024-02-10 HISTORY — PX: BRONCHIAL BIOPSY: SHX5109

## 2024-02-10 HISTORY — PX: VIDEO BRONCHOSCOPY: SHX5072

## 2024-02-10 HISTORY — PX: BRONCHIAL WASHINGS: SHX5105

## 2024-02-10 SURGERY — BRONCHOSCOPY, WITH FLUOROSCOPY
Anesthesia: General

## 2024-02-10 MED ORDER — FENTANYL CITRATE (PF) 100 MCG/2ML IJ SOLN
INTRAMUSCULAR | Status: AC
Start: 2024-02-10 — End: 2024-02-10
  Filled 2024-02-10: qty 2

## 2024-02-10 MED ORDER — LACTATED RINGERS IV SOLN: INTRAVENOUS | Status: DC | PRN

## 2024-02-10 MED ORDER — PROPOFOL 10 MG/ML IV BOLUS
INTRAVENOUS | Status: DC | PRN
  Administered 2024-02-10: 120 mg via INTRAVENOUS

## 2024-02-10 MED ORDER — DEXAMETHASONE SODIUM PHOSPHATE 10 MG/ML IJ SOLN
INTRAMUSCULAR | Status: DC | PRN
  Administered 2024-02-10: 10 mg via INTRAVENOUS

## 2024-02-10 MED ORDER — FENTANYL CITRATE (PF) 100 MCG/2ML IJ SOLN: 25.0000 ug | INTRAMUSCULAR | Status: DC | PRN

## 2024-02-10 MED ORDER — FENTANYL CITRATE (PF) 250 MCG/5ML IJ SOLN
INTRAMUSCULAR | Status: DC | PRN
  Administered 2024-02-10: 100 ug via INTRAVENOUS

## 2024-02-10 MED ORDER — EPHEDRINE SULFATE (PRESSORS) 50 MG/ML IJ SOLN
INTRAMUSCULAR | Status: DC | PRN
  Administered 2024-02-10 (×2): 10 mg via INTRAVENOUS
  Administered 2024-02-10: 5 mg via INTRAVENOUS

## 2024-02-10 MED ORDER — SODIUM CHLORIDE (PF) 0.9 % IJ SOLN
PREFILLED_SYRINGE | INTRAMUSCULAR | Status: DC | PRN
  Administered 2024-02-10: 6 mL

## 2024-02-10 MED ORDER — LIDOCAINE 2% (20 MG/ML) 5 ML SYRINGE
INTRAMUSCULAR | Status: DC | PRN
  Administered 2024-02-10: 60 mg via INTRAVENOUS

## 2024-02-10 MED ORDER — PROPOFOL 500 MG/50ML IV EMUL
INTRAVENOUS | Status: DC | PRN
Start: 2024-02-10 — End: 2024-02-10
  Administered 2024-02-10: 125 ug/kg/min via INTRAVENOUS

## 2024-02-10 MED ORDER — PHENYLEPHRINE HCL (PRESSORS) 10 MG/ML IV SOLN
INTRAVENOUS | Status: DC | PRN
  Administered 2024-02-10: 80 ug via INTRAVENOUS
  Administered 2024-02-10: 160 ug via INTRAVENOUS

## 2024-02-10 MED ORDER — OXYCODONE HCL 5 MG PO TABS: 5.0000 mg | ORAL_TABLET | Freq: Once | ORAL | Status: DC | PRN

## 2024-02-10 MED ORDER — SODIUM CHLORIDE 0.9 % IV SOLN: Freq: Once | INTRAVENOUS | Status: DC

## 2024-02-10 MED ORDER — SUGAMMADEX SODIUM 200 MG/2ML IV SOLN
INTRAVENOUS | Status: DC | PRN
  Administered 2024-02-10: 200 mg via INTRAVENOUS

## 2024-02-10 MED ORDER — CHLORHEXIDINE GLUCONATE 0.12 % MT SOLN
OROMUCOSAL | Status: AC
  Filled 2024-02-10: qty 15

## 2024-02-10 MED ORDER — DROPERIDOL 2.5 MG/ML IJ SOLN: 0.6250 mg | Freq: Once | INTRAMUSCULAR | Status: DC | PRN

## 2024-02-10 MED ORDER — ONDANSETRON HCL 4 MG/2ML IJ SOLN: 4.0000 mg | Freq: Once | INTRAMUSCULAR | Status: DC | PRN

## 2024-02-10 MED ORDER — CHLORHEXIDINE GLUCONATE 0.12 % MT SOLN
15.0000 mL | Freq: Once | OROMUCOSAL | Status: AC
  Administered 2024-02-10: 15 mL via OROMUCOSAL

## 2024-02-10 MED ORDER — OXYCODONE HCL 5 MG/5ML PO SOLN: 5.0000 mg | Freq: Once | ORAL | Status: DC | PRN

## 2024-02-10 MED ORDER — ROCURONIUM 10MG/ML (10ML) SYRINGE FOR MEDFUSION PUMP - OPTIME
INTRAVENOUS | Status: DC | PRN
Start: 2024-02-10 — End: 2024-02-10
  Administered 2024-02-10: 50 mg via INTRAVENOUS

## 2024-02-10 MED ORDER — ONDANSETRON HCL 4 MG/2ML IJ SOLN
INTRAMUSCULAR | Status: DC | PRN
  Administered 2024-02-10: 4 mg via INTRAVENOUS

## 2024-02-10 MED ORDER — SODIUM CHLORIDE 0.9 % IV SOLN
INTRAVENOUS | Status: DC | PRN
  Administered 2024-02-10: 500 mL via INTRAVENOUS

## 2024-02-10 MED ORDER — EPINEPHRINE 1 MG/10ML IJ SOSY
PREFILLED_SYRINGE | INTRAMUSCULAR | Status: AC
  Filled 2024-02-10: qty 10

## 2024-02-10 NOTE — Anesthesia Preprocedure Evaluation (Addendum)
 Anesthesia Evaluation  Patient identified by MRN, date of birth, ID band Patient awake    Reviewed: Allergy & Precautions, NPO status , Patient's Chart, lab work & pertinent test results  Airway Mallampati: I  TM Distance: >3 FB     Dental no notable dental hx. (+) Teeth Intact, Caps, Dental Advisory Given   Pulmonary former smoker Interstitial lung abnormality Chronic cough   Pulmonary exam normal breath sounds clear to auscultation       Cardiovascular negative cardio ROS Normal cardiovascular exam Rhythm:Regular Rate:Normal     Neuro/Psych Seizures -,  PSYCHIATRIC DISORDERS Anxiety Depression    Hx/o panic attacks   GI/Hepatic negative GI ROS, Neg liver ROS,,,  Endo/Other  Hypothyroidism  HLD  Renal/GU negative Renal ROS  negative genitourinary   Musculoskeletal  (+) Arthritis , Osteoarthritis,    Abdominal   Peds  Hematology   Anesthesia Other Findings   Reproductive/Obstetrics                              Anesthesia Physical Anesthesia Plan  ASA: 2  Anesthesia Plan: General   Post-op Pain Management: Minimal or no pain anticipated   Induction: Intravenous  PONV Risk Score and Plan: 4 or greater and Treatment may vary due to age or medical condition, Propofol  infusion and TIVA  Airway Management Planned: Oral ETT  Additional Equipment: None  Intra-op Plan:   Post-operative Plan: Extubation in OR  Informed Consent: I have reviewed the patients History and Physical, chart, labs and discussed the procedure including the risks, benefits and alternatives for the proposed anesthesia with the patient or authorized representative who has indicated his/her understanding and acceptance.     Dental advisory given  Plan Discussed with: CRNA and Anesthesiologist  Anesthesia Plan Comments:          Anesthesia Quick Evaluation

## 2024-02-10 NOTE — Telephone Encounter (Signed)
 Pls give followup video o on site. APP ok in 4-6 weekst o discuss bronch results. [place] ok too.

## 2024-02-10 NOTE — Anesthesia Postprocedure Evaluation (Signed)
 Anesthesia Post Note  Patient: Pam Salazar  Procedure(s) Performed: BRONCHOSCOPY, WITH FLUOROSCOPY IRRIGATION, BRONCHUS BRONCHOSCOPY, WITH BIOPSY     Patient location during evaluation: PACU Anesthesia Type: General Level of consciousness: awake and alert and oriented Pain management: pain level controlled Vital Signs Assessment: post-procedure vital signs reviewed and stable Respiratory status: spontaneous breathing, nonlabored ventilation and respiratory function stable Cardiovascular status: blood pressure returned to baseline and stable Postop Assessment: no apparent nausea or vomiting Anesthetic complications: no   No notable events documented.  Last Vitals:  Vitals:   02/10/24 1500 02/10/24 1510  BP: (!) 103/43 (!) 100/46  Pulse: (!) 58 60  Resp: 15 16  Temp:    SpO2: 97% 96%    Last Pain:  Vitals:   02/10/24 1510  TempSrc:   PainSc: 0-No pain                 Aqueelah Cotrell A.

## 2024-02-10 NOTE — Telephone Encounter (Signed)
 Copied from CRM [id number]. Topic: Clinical - Red Word Triage >> Feb 10, 2024  5:02 PM Celestine FALCON wrote: Red Word that prompted transfer to Nurse Triage: Pt has a bronchoscopy today with Dr. Geronimo, and the pt stated the anesthesiologist suggested that Dr. Geronimo might send in something for a cough if the pt needed it. The pt stated she is coughing and wouldn't mind it being called in if that's something Dr. Geronimo agreed she needed.  Pt's preferred pharmacy info: [id number] - [place], [place] - [address]  Pt's phone number for a call back: [phone] ok to leave a vm.

## 2024-02-10 NOTE — Telephone Encounter (Signed)
 Pt calling inquiring about discharge instructions post-bronch today. Pt reports that she was supposed to get a Rx for coughing medication for post-procedure. Triager noted Dr. Reeves note attached and notified pt of the plan. Patient verbalized understanding and to call back with worsening symptoms.      Copied from CRM [id number]. Topic: Clinical - Red Word Triage >> Feb 10, 2024  5:02 PM Pam Salazar wrote: Red Word that prompted transfer to Nurse Triage: Pt has a bronchoscopy today with Dr. Geronimo, and the pt stated the anesthesiologist suggested that Dr. Geronimo might send in something for a cough if the pt needed it. The pt stated she is coughing and wouldn't mind it being called in if that's something Dr. Geronimo agreed she needed.  Pt's preferred pharmacy info: [id number] - [place], [place] - [address]  Pt's phone number for a call back: [phone] ok to leave a vm.

## 2024-02-10 NOTE — Interval H&P Note (Signed)
 02/10/2024 - last seen < 30d ago. Now prsents for Bronchoscopy. Since last visit Interim Health status: No new complaints No new medical problems. No new surgeries. No ER visits. No Urgent care visits. No changes to medications  ROS  -bad chronic cough +  VItals  [place] AxXOx3 Speech normal CTA bilaterally Normal heart sounds Abd soft Ext ineact  A Chronic cough BIalterall Diffuse UL > LL - GGO/fine nodularity  Plan  - Risks of pneumothorax, hemothorax, sedation/anesthesia complications such as cardiac or respiratory arrest or hypotension, stroke and bleeding all explained. Benefits of diagnosis but limitations of non-diagnosis also explained. Patient verbalized understanding and wished to proceed.   - BAL +/- TBbx     SIGNATURE    Dr. Dorethia Cave, M.D., F.C.C.P,  Pulmonary and Critical Care Medicine Staff Physician, [facility] Center Director - Interstitial Lung Disease  Program  Pulmonary Fibrosis [facility] at [facility] [place], [place], [address]   Pager: [phone], If no answer  -> Check AMION or Try [phone] Telephone (clinical office): [phone] Telephone (research): [phone]  1:14 PM 02/10/2024

## 2024-02-10 NOTE — Anesthesia Procedure Notes (Signed)
 Procedure Name: Intubation Date/Time: 02/10/2024 1:49 PM  Performed by: Nanci Riis, CRNAPre-anesthesia Checklist: Patient identified, Emergency Drugs available, Suction available, Patient being monitored and Timeout performed Patient Re-evaluated:Patient Re-evaluated prior to induction Oxygen Delivery Method: Circle system utilized Preoxygenation: Pre-oxygenation with 100% oxygen Induction Type: IV induction Ventilation: Mask ventilation without difficulty Laryngoscope Size: Miller and 3 Grade View: Grade I Tube type: Oral Tube size: 7.0 mm Number of attempts: 1 Airway Equipment and Method: Stylet Placement Confirmation: ETT inserted through vocal cords under direct vision, positive ETCO2 and breath sounds checked- equal and bilateral Secured at: 20 cm Tube secured with: Tape Dental Injury: Teeth and Oropharynx as per pre-operative assessment

## 2024-02-10 NOTE — Transfer of Care (Signed)
 Immediate Anesthesia Transfer of Care Note  Patient: Pam Salazar  Procedure(s) Performed: BRONCHOSCOPY, WITH FLUOROSCOPY IRRIGATION, BRONCHUS BRONCHOSCOPY, WITH BIOPSY  Patient Location: Endoscopy Unit  Anesthesia Type:General  Level of Consciousness: drowsy and patient cooperative  Airway & Oxygen Therapy: Patient Spontanous Breathing  Post-op Assessment: Report given to RN and Post -op Vital signs reviewed and stable  Post vital signs: Reviewed and stable  Last Vitals:  Vitals Value Taken Time  BP 114/58 02/10/24 14:26  Temp    Pulse    Resp 15 02/10/24 14:28  SpO2    Vitals shown include unfiled device data.  Last Pain:  Vitals:   02/10/24 1236  TempSrc: Temporal  PainSc: 0-No pain      Patients Stated Pain Goal: 0 (02/10/24 1236)  Complications: No notable events documented.

## 2024-02-10 NOTE — Telephone Encounter (Signed)
 Clinical Tream  [name]  - had lot of mucus in bronch  Plan  - mucinex  OTC  = send 3mL of 3% hypertonic saline neb bid

## 2024-02-10 NOTE — Discharge Instructions (Addendum)
 BRONCHOSCOPY DISCHARGE INSTRUCTIONS  - Please have someone to drive you home - Please be careful with activities for next 24 hours - You can eat 2-4 hours after getting home provided you are fully alert, able to cough, and are not nauseated or vomiting and     feel well - You are expected to have low grade fever or cough some amount of blood for next 24-48 hours; if this worsens call us  - If you are very short of breath or coughing blood or chest pain or not feeling well, call us  [phone] anytime or go to emergency room - For followup appointment - see below. If not there please call [phone] to make an appointment with Dr Geronimo or nurse practitioner   Future Appointments  Date Time Provider Department Center  07/06/2024  1:45 PM Jackquline Sawyer, MD [facility] None  07/14/2024  2:20 PM [facility] ANNUAL WELLNESS VISIT [facility]  11/11/2024 10:00 AM Dineen Rollene MATSU, FNP [facility]  01/25/2025  2:00 PM Jackquline Sawyer, MD [facility] None

## 2024-02-10 NOTE — Telephone Encounter (Signed)
 This encounter was created in error - please disregard.

## 2024-02-10 NOTE — Op Note (Signed)
 Name:  [name] MRN:  [id number] DOB:  02-Sep-1945  PROCEDURE NOTE  Procedure(s): Flexible bronchoscopy [phone]  Bronchial alveolar lavage [phone] of the Left Upper Lobe  Transbronchial lung biopsy, single lobe [phone]) of the Left Upper lobe x 2 pieces  Indications:  chronic cough, ILA, ILD,  multiple nodules  Consent:  Procedure, benefits, risks and alternatives discussed.  Questions answered.  Consent obtained.  Anesthesia:   General anesthesia  Location: [facility]  Procedure summary:  Appropriate equipment was assembled.  The patient was brought to the procedure suite room and identified as Pam Salazar with [birth year]  Safety timeout was performed. The patient was placed supine on the  table, airway and moderate sedation administered by this operator  After the appropriate level of  GAS was assured, flexible video bronchoscope was lubricated and inserted through the ENDOTRACHEAL TUBE    Airway examination was yes performed bilaterally to subsegmental level.   THICK secretions were noted splatterd across mucosa in carina,  LMB, RLL , Mucosa appeared normal otherwise and NO endobronchial lesions were identified. Mucus was easiy suctioned  Bronchial alveolar lavage of the YES was performed with 40 mL of normal saline  2 number of times for total volume of 120 mL. Total return of 40 mL of GREY MUCUS fluid, after which the bronchoscope was withdrawn.  NOTED prior to this 20mL of lavage done to the cannister   Flexible video bronchoscope was used again to perform TRANSBRONHIAL biopsies of   - LEFT UPPER LOBE  x 2 times under fluoro. Different ssegments entered but each time there was oozing of blood that needed cold saline and 2nd time diluted epi. This is despite fact area was pre-treate with cold BAL and also post BAL diluted epi. No futher biopsies therefore atttempted  After hemostasis was assure, the bronchoscope was withdrawn.  The patient was recovered and  then  transferred to recovery area  Post-procedure chest x-ray was YES ordered.  Specimens sent: Bronchial alveolar lavage specimen of the LUL for cell count , microbiology and cytology.  Complications:  No immediate complications were noted.  Hemodynamic parameters and oxygenation remained stable throughout the procedure.  Estimated blood loss:  2 mL - 5mL  IMPRESSION 1. Normal airway but splattered with mucus 2. LUL BAL 3. LUL transbronchial biopsy  Followup Future Appointments  Date Time Provider Department Center  07/06/2024  1:45 PM Jackquline Sawyer, MD [facility] None  07/14/2024  2:20 PM [facility] ANNUAL WELLNESS VISIT [facility]  11/11/2024 10:00 AM Dineen Rollene MATSU, FNP [facility]  01/25/2025  2:00 PM Jackquline Sawyer, MD [facility] None     Dr. Dorethia Cave, M.D., [facility].C.P Pulmonary and Critical Care Medicine Staff Physician [facility] [place] [facility] Pager: [phone], If no answer or between  15:00h - 7:00h: call [phone]  02/10/2024 2:24 PM

## 2024-02-11 ENCOUNTER — Telehealth: Payer: Self-pay | Admitting: *Deleted

## 2024-02-11 ENCOUNTER — Encounter (HOSPITAL_COMMUNITY): Payer: Self-pay | Admitting: Internal Medicine

## 2024-02-11 DIAGNOSIS — R918 Other nonspecific abnormal finding of lung field: Secondary | ICD-10-CM

## 2024-02-11 DIAGNOSIS — R053 Chronic cough: Secondary | ICD-10-CM

## 2024-02-11 LAB — BODY FLUID CELL COUNT WITH DIFFERENTIAL
Eos, Fluid: 0 %
Lymphs, Fluid: 2 %
Monocyte-Macrophage-Serous Fluid: 10 % — ABNORMAL LOW (ref 50–90)
Neutrophil Count, Fluid: 88 % — ABNORMAL HIGH (ref 0–25)
Total Nucleated Cell Count, Fluid: 600 uL (ref 0–1000)

## 2024-02-11 LAB — PNEUMOCYSTIS JIROVECI SMEAR BY DFA: Pneumocystis jiroveci Ag: NEGATIVE

## 2024-02-11 LAB — MISC LABCORP TEST (SEND OUT): Labcorp test code: 520080

## 2024-02-11 NOTE — Telephone Encounter (Signed)
 2nd attempt Called PT no answer left VM

## 2024-02-11 NOTE — Telephone Encounter (Signed)
 Copied from CRM #[id number]. Topic: Clinical - Medical Advice >> Feb 11, 2024  9:16 AM Leila C wrote: Reason for CRM: Patient states did not get a discharge summary after bronchoscopy, does patient need one, if so can it be put in Mychart. Patient states she's still feeling the foggy and loopy. Patient has been dealing with a  cough for about a year now. Patient any other symptoms at this moment. Also, patient wants Dr. Geronimo to know he did a good job, and thank you.   I called and spoke with the pt. She states that she woke up this morning with shaky hands. She wanted to let Dr. Geronimo know this. She denies feeling foggy and loopy as stated in the initial msg that was taken. Routing to MR as FYI.

## 2024-02-11 NOTE — Telephone Encounter (Signed)
 See phone note dated 02/11/24

## 2024-02-12 ENCOUNTER — Emergency Department

## 2024-02-12 ENCOUNTER — Emergency Department
Admission: EM | Admit: 2024-02-12 | Discharge: 2024-02-12 | Disposition: A | Discharge status: Home / Self Care | Source: Other Acute Inpatient Hospital | Attending: Emergency Medicine | Admitting: Emergency Medicine

## 2024-02-12 ENCOUNTER — Other Ambulatory Visit: Payer: Self-pay

## 2024-02-12 ENCOUNTER — Ambulatory Visit: Payer: Self-pay

## 2024-02-12 ENCOUNTER — Encounter: Payer: Self-pay | Admitting: Emergency Medicine

## 2024-02-12 DIAGNOSIS — M47816 Spondylosis without myelopathy or radiculopathy, lumbar region: Secondary | ICD-10-CM | POA: Diagnosis not present

## 2024-02-12 DIAGNOSIS — M5126 Other intervertebral disc displacement, lumbar region: Secondary | ICD-10-CM | POA: Diagnosis not present

## 2024-02-12 DIAGNOSIS — M545 Low back pain, unspecified: Secondary | ICD-10-CM | POA: Insufficient documentation

## 2024-02-12 DIAGNOSIS — M438X6 Other specified deforming dorsopathies, lumbar region: Secondary | ICD-10-CM | POA: Diagnosis not present

## 2024-02-12 DIAGNOSIS — M4316 Spondylolisthesis, lumbar region: Secondary | ICD-10-CM | POA: Diagnosis not present

## 2024-02-12 LAB — CYTOLOGY - NON PAP

## 2024-02-12 LAB — SURGICAL PATHOLOGY

## 2024-02-12 MED ORDER — HYDROCODONE-ACETAMINOPHEN 5-325 MG PO TABS
1.0000 | ORAL_TABLET | Freq: Once | ORAL | Status: AC
  Administered 2024-02-12: 1 via ORAL
  Filled 2024-02-12: qty 1

## 2024-02-12 MED ORDER — HYDROCODONE-ACETAMINOPHEN 5-325 MG PO TABS
1.0000 | ORAL_TABLET | Freq: Four times a day (QID) | ORAL | 0 refills | Status: DC | PRN
Start: 2024-02-12 — End: 2024-02-24

## 2024-02-12 NOTE — Telephone Encounter (Signed)
 FYI Only or Action Required?: FYI only for provider.  Patient was last seen in primary care on 12/03/2023 by Dineen Rollene MATSU, FNP.  Called Nurse Triage reporting Back Pain.  Symptoms began a week ago.  Interventions attempted: OTC medications: Tylenol  and Ice/heat application.  Symptoms are: gradually worsening.  Triage Disposition: See PCP When Office is Open (Within 3 Days)  Patient/caregiver understands and will follow disposition?: Yes  Copied from CRM #[id number]. Topic: Clinical - Red Word Triage >> Feb 12, 2024 10:54 AM Thersia BROCKS wrote: [facility] that prompted transfer to Nurse Triage: Patient called in stated she is pain in her lower back, was right lower back now all across, is very intense pain. Reason for Disposition  [1] MODERATE back pain (e.g., interferes with normal activities) AND [2] present > 3 days  Answer Assessment - Initial Assessment Questions 1. ONSET: When did the pain begin? (e.g., minutes, hours, days)     Aprox 7 days  2. LOCATION: Where does it hurt? (upper, mid or lower back)     Across Lower back   3. SEVERITY: How bad is the pain?  (e.g., Scale 1-10; mild, moderate, or severe)     Very Intense pain  4. PATTERN: Is the pain constant? (e.g., yes, no; constant, intermittent)      Constant,   5. RADIATION: Does the pain shoot into your legs or somewhere else?     No  6. CAUSE:  What do you think is causing the back pain?      Laid on hard table for bronchoscopy last week and thinks a muscle may been pulled  7. BACK OVERUSE:  Any recent lifting of heavy objects, strenuous work or exercise?     No  8. MEDICINES: What have you taken so far for the pain? (e.g., nothing, acetaminophen , NSAIDS)     Tylenol   9. NEUROLOGIC SYMPTOMS: Do you have any weakness, numbness, or problems with bowel/bladder control?     *No Answer* 10. OTHER SYMPTOMS: Do you have any other symptoms? (e.g., fever, abdomen pain, burning with urination,  blood in urine)       *No Answer* 11. PREGNANCY: Is there any chance you are pregnant? When was your last menstrual period?       No  Protocols used: Back Pain-A-AH

## 2024-02-12 NOTE — ED Provider Notes (Signed)
 [facility] Provider Note    Event Date/Time   First MD Initiated Contact with Patient 02/12/24 1634     (approximate)   History   Back Pain   HPI  Pam Salazar is a 78 y.o. female has been evaluated recently for significant chronic cough also suffers from depression.     Patient underwent bronchoscopy and bronchoalveolar lavage on August 19.  Patient relates after procedure she noticed her right lower back was sore but continuing to be very sore painful with movement in the right low back.  The pain does not radiate does not shoot down her legs no numbness or weakness.  She is able to walk without difficulty.  Has been no falls or injuries.  No fevers.  No abdominal pain  She reports chronic cough no change from its normal.  She also tells me she went to urgent care but was shuffled to the ER because of hypotension.  However she tells me that she suffers chronic low blood pressure sometimes as low in the 80s   [Was able to review notes including recent outpatient pulmonology note where she had hypotension, in addition her primary care documented that her systolic blood pressures can normally run in the 80s to 90s per Dr. Katherene --- it would appear that hypotension is not a new factor for her  Patient tells me she is not having any lightheadedness or feeling faint.  She has not had any symptoms that had concerned her that her blood pressure would be a problem, and notes her blood pressure always runs low  No pain or burning with urination.  Patient reports she did not have any sort of urinary catheter during her procedure.  Physical Exam   Triage Vital Signs: ED Triage Vitals  Encounter Vitals Group     BP 02/12/24 1432 103/66     Girls Systolic BP Percentile --      Girls Diastolic BP Percentile --      Boys Systolic BP Percentile --      Boys Diastolic BP Percentile --      Pulse Rate 02/12/24 1432 62     Resp 02/12/24 1432 18      Temp 02/12/24 1432 98 F (36.7 C)     Temp src --      SpO2 02/12/24 1432 98 %     Weight 02/12/24 1431 119 lb 0.8 oz (54 kg)     Height 02/12/24 1431 5' 3 (1.6 m)     Head Circumference --      Peak Flow --      Pain Score 02/12/24 1431 10     Pain Loc --      Pain Education --      Exclude from Growth Chart --     Most recent vital signs: Vitals:   02/12/24 1432  BP: 103/66  Pulse: 62  Resp: 18  Temp: 98 F (36.7 C)  SpO2: 98%     General: Awake, no distress.  CV:  Good peripheral perfusion.  Resp:  Normal effort.  Abd:  No distention.  Soft nontender nondistended.  No reproducible flank tenderness. Other:  Strong palpable pulse lower extremities bilateral  Lower Extremities  No edema. Normal DP/PT pulses bilateral with good cap refill.  Normal neuro-motor function lower extremities bilateral.  RIGHT Right lower extremity demonstrates normal strength, good use of all muscles. No edema bruising or contusions of the right hip, right knee, right ankle. Full range  of motion of the right lower extremity without pain. No pain on axial loading. No evidence of trauma.  LEFT Left lower extremity demonstrates normal strength, good use of all muscles. No edema bruising or contusions of the hip,  knee, ankle. Full range of motion of the left lower extremity without pain. No pain on axial loading. No evidence of trauma.  She has very reassuring examination of both lower extremities which are normal.  Additionally she has reproducible tenderness involving the right lower paraspinous region no frank deformity or midline lumbar tenderness associated primarily over the right lower paraspinous region.  Denies any urinary incontinence or difficulty voiding   ED Results / Procedures / Treatments   Labs (all labs ordered are listed, but only abnormal results are displayed) Labs Reviewed - No data to display   EKG     RADIOLOGY  Low pretest probability for fracture based  on exam but given her recent general anesthesia and presumptively she was laying on a hard flat table for a period of time will obtain x-ray to evaluate EXTR no obvious bony fracture though I most suspect musculoskeletal    Lumbar x-ray inter by me is negative for acute fracture or obvious deformity  DG Lumbar Spine Complete Result Date: 02/12/2024 CLINICAL DATA:  low back pain post-surgery, eval for compression fracture or bony injury. EXAM: LUMBAR SPINE - COMPLETE 4+ VIEW COMPARISON:  04/12/2014. FINDINGS: There are 5 nonrib-bearing lumbar vertebrae. Anatomic lumbar curvature. No spondylolysis. Minimal/grade 1 retrolisthesis of L2 over L3 and minimal/grade 1 anterolisthesis of L3 over L4, most likely degenerative. Vertebral body heights are maintained. No aggressive osseous lesion. Mild-moderate multilevel degenerative changes in the form of reduced intervertebral disc height, endplate sclerosis/irregularity, facet arthropathy and marginal osteophyte formation. Sacroiliac joints are symmetric. Visualized soft tissues are within normal limits. Right hip arthroplasty noted. IMPRESSION: No acute osseous abnormality of the lumbar spine. Mild-to-moderate multilevel degenerative changes. Electronically Signed   By: Ree Molt M.D.   On: 02/12/2024 17:13      PROCEDURES:  Critical Care performed: No  Procedures   MEDICATIONS ORDERED IN ED: Medications  HYDROcodone -acetaminophen  (NORCO/VICODIN) 5-325 MG per tablet 1 tablet (1 tablet Oral Given 02/12/24 1700)     IMPRESSION / MDM / ASSESSMENT AND PLAN / ED COURSE  I reviewed the triage vital signs and the nursing notes.                              Differential diagnosis includes, but is not limited to, musculoskeletal pain, herniated disc though no radiating pain or neurologic deficits, no findings to be highly suggestive or concerning for cauda equina, compression fracture etc.  No associated abdominal pain no infectious symptoms no  urinary symptoms clinically no evidence that would support intra-abdominal or referred back pain/renal cause etc.  Discussed with patient and husband she has previously used hydrocodone  in the distant past with good effect.  Will treat with hydrocodone , anticipate brief course discussed use only as prescribed and not driving while taking.  Additionally she is agreeable not to mix her clonazepam  within 6 hours of use of hydrocodone .  Patient's presentation is most consistent with acute complicated illness / injury requiring diagnostic workup.      Clinical Course as of 02/12/24 1737  Thu Feb 12, 2024  1735 Patient able to sit up fully awake alert.  Agreeable to careful return precautions.  Husband driving she will not be driving and will not  take hydrocodone  more than prescribed, while driving, or mixed with her clonazepam .  She will follow-up closely with primary doctor.  X-rays negative for fracture [MQ]    Clinical Course User Index [MQ] Dicky Anes, MD   Return precautions and treatment recommendations and follow-up discussed with the patient who is agreeable with the plan.   FINAL CLINICAL IMPRESSION(S) / ED DIAGNOSES   Final diagnoses:  Acute right-sided low back pain without sciatica     Rx / DC Orders   ED Discharge Orders          Ordered    HYDROcodone -acetaminophen  (NORCO/VICODIN) 5-325 MG tablet  Every 6 hours PRN        02/12/24 1736             Note:  This document was prepared using Dragon voice recognition software and may include unintentional dictation errors.   Dicky Anes, MD 02/12/24 [phone]

## 2024-02-12 NOTE — Telephone Encounter (Signed)
 They should have given her somtehing in paper but if not basically come back in 6 weeks to discuss results. I am sending message to scheduling as ell  Fogginess should clear up  For cough - I thought I sent a message to clinica to send her 3% nebulized saline and to do OTC mucinex 

## 2024-02-12 NOTE — ED Triage Notes (Signed)
 Pt presents to the ED via POV with complaints of low back pain since having a procedure on Tuesday. She notes being on a metal table for hours and the pain was present at D/C and has worsened over the last 2 days. Rates the pain 10/10 - she notes taking Tylenol  this AM. A&Ox4 at this time. Denies falls, urinary sx, CP or SOB.

## 2024-02-13 ENCOUNTER — Ambulatory Visit: Payer: Self-pay | Admitting: Internal Medicine

## 2024-02-13 DIAGNOSIS — R053 Chronic cough: Secondary | ICD-10-CM

## 2024-02-13 DIAGNOSIS — R918 Other nonspecific abnormal finding of lung field: Secondary | ICD-10-CM

## 2024-02-13 DIAGNOSIS — Z77120 Contact with and (suspected) exposure to mold (toxic): Secondary | ICD-10-CM

## 2024-02-13 LAB — CULTURE, RESPIRATORY W GRAM STAIN: Culture: NO GROWTH

## 2024-02-13 LAB — ACID FAST SMEAR (AFB, MYCOBACTERIA): Acid Fast Smear: NEGATIVE

## 2024-02-16 DIAGNOSIS — M5416 Radiculopathy, lumbar region: Secondary | ICD-10-CM | POA: Diagnosis not present

## 2024-02-17 ENCOUNTER — Encounter: Payer: Self-pay | Admitting: Acute Care

## 2024-02-18 DIAGNOSIS — M5416 Radiculopathy, lumbar region: Secondary | ICD-10-CM | POA: Diagnosis not present

## 2024-02-18 DIAGNOSIS — N281 Cyst of kidney, acquired: Secondary | ICD-10-CM | POA: Diagnosis not present

## 2024-02-18 DIAGNOSIS — M48061 Spinal stenosis, lumbar region without neurogenic claudication: Secondary | ICD-10-CM | POA: Diagnosis not present

## 2024-02-18 DIAGNOSIS — M51379 Other intervertebral disc degeneration, lumbosacral region without mention of lumbar back pain or lower extremity pain: Secondary | ICD-10-CM | POA: Diagnosis not present

## 2024-02-18 MED ORDER — SODIUM CHLORIDE 3 % IN NEBU: INHALATION_SOLUTION | RESPIRATORY_TRACT | 3 refills | Status: DC | PRN

## 2024-02-18 NOTE — Addendum Note (Signed)
 Addended by: ROLANDA POWELL SAILOR on: 02/18/2024 03:06 PM   Modules accepted: Orders

## 2024-02-18 NOTE — Addendum Note (Signed)
 Addended by: ROLANDA POWELL SAILOR on: 02/18/2024 03:30 PM   Modules accepted: Orders

## 2024-02-18 NOTE — Telephone Encounter (Addendum)
 Called and spoke with patient, advised her that [facility] did not carry the 3% saline nebs so I sent it to a [employer] [facility].  I let her know that the nebulizer and the saline would come from the same place.  She verified understanding.  Nothing further needed.   Received a call from a pharmacist at [facility]) stating that they do not carry the 3% sodium chloride  solution.  Advised I would send it to a DME company instead.  Nothing further needed.    Called and spoke with patient, advised of recommendations per Dr. Geronimo.  She verbalized understanding.  I verified her pharmacy and script sent.  She did not have a nebulizer machine, so that was sent in as well.  Advised she would be contacted by a DME company once it was approved by her insurance company.  She verbalized understanding.  Nothing further needed.

## 2024-02-20 DIAGNOSIS — Z961 Presence of intraocular lens: Secondary | ICD-10-CM | POA: Diagnosis not present

## 2024-02-20 DIAGNOSIS — H353131 Nonexudative age-related macular degeneration, bilateral, early dry stage: Secondary | ICD-10-CM | POA: Diagnosis not present

## 2024-02-24 ENCOUNTER — Ambulatory Visit (INDEPENDENT_AMBULATORY_CARE_PROVIDER_SITE_OTHER): Admitting: Internal Medicine

## 2024-02-24 ENCOUNTER — Encounter: Payer: Self-pay | Admitting: Internal Medicine

## 2024-02-24 VITALS — BP 98/58 | HR 69 | Ht 63.0 in | Wt 124.0 lb

## 2024-02-24 DIAGNOSIS — Z87891 Personal history of nicotine dependence: Secondary | ICD-10-CM | POA: Diagnosis not present

## 2024-02-24 DIAGNOSIS — J8410 Pulmonary fibrosis, unspecified: Secondary | ICD-10-CM

## 2024-02-24 DIAGNOSIS — R053 Chronic cough: Secondary | ICD-10-CM | POA: Diagnosis not present

## 2024-02-24 DIAGNOSIS — Z5181 Encounter for therapeutic drug level monitoring: Secondary | ICD-10-CM | POA: Diagnosis not present

## 2024-02-24 DIAGNOSIS — J679 Hypersensitivity pneumonitis due to unspecified organic dust: Secondary | ICD-10-CM | POA: Diagnosis not present

## 2024-02-24 DIAGNOSIS — Z7952 Long term (current) use of systemic steroids: Secondary | ICD-10-CM | POA: Diagnosis not present

## 2024-02-24 DIAGNOSIS — Y93H2 Activity, gardening and landscaping: Secondary | ICD-10-CM

## 2024-02-24 MED ORDER — PREDNISONE 10 MG PO TABS: ORAL_TABLET | ORAL | 0 refills | Status: DC

## 2024-02-24 NOTE — Patient Instructions (Addendum)
 ICD-10-CM   1. 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 Activity involving gardening or landscaping  Y93.H2     4. Chronic cough  R05.3     5. 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   Biopsy 02/10/24 shows acute and chronic inflammtion with granuloma (TB test negative).. Concer is for sub-acute hypersensitivity pneumonitis due to gardening. Culture negative     Plan - Avoid gardening or mold exposure - start steroids  - - Start prednisone  -40 mg once daily for 2 weeks and then 30 mg once daily for 2 weeks and then 20 mg once daily for 2 weeks and then 10 mg once daily for 2 weeks then 5 mg once daily for 2 weeks and then 5 mg on Monday Wednesday Friday for 2 weeks and stop (Total 12 week course, Dispense 150 of 10mg  tabs)   - get PCP Pam Rollene MATSU, FNP tp monitor your BP, sugar, bone health  -do spiro and dlco in 4-6  weeks   Follow-up - let us  know any time if there are side effects with steroids - Return in 4-6 weeks 30 min visit to discuss course

## 2024-02-24 NOTE — Progress Notes (Signed)
 OV 04/24/2023  Subjective:  Patient ID: Pam Salazar, female , DOB: 22-May-1946 , age 78 y.o. , MRN: [id number] , ADDRESS: [address] [place] [id number] PCP Maribeth Camellia MATSU, MD Patient Care Team: Maribeth Camellia MATSU, MD as PCP - General (Family Medicine) Dellie Louanne MATSU, MD as Consulting Physician (General Surgery) Glendia Shad, MD (Internal Medicine)  This Provider for this visit: Treatment Team:  Attending Provider: Geronimo Amel, MD    04/24/2023 -   Chief Complaint  Patient presents with   Consult    Pt is here for Consult visit.  Pt needs ILD explained to her.     HPI Pam Salazar 78 y.o. -retired Engineer, civil (consulting).  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  She is a former smoker.  She says she started smoking while she was in [place] at [facility] and then quit 2014 or so.  Review of the lung cancer screening notes indicate that she has a 23 pack smoking history and quit in 2014.  She is here because the low-dose CT scan of the chest done in August 2024 showed some reticular opacities in the lung base and therefore she has been referred here.  She says that she was in a two-story house and she only gets dyspneic when she climbs back up for the second time.'s been stable like this for 10 years.  Some 3 weeks ago she had hip surgery and therefore she is not able to mobilize well but she does not think there is any health status change.  There is no chest pain.  She has had annual low-dose CT scans of the chest and I personally visualized this and this basically reticular opacities in the lung base consistent with linear atelectasis or postinflammatory scarring.  This extends back even in the prior ER 2023.  Interstitial lung disease questionnaire done and she brought it with her - Symptoms: Mild dyspnea when climbing stairs and mild fatigue and mild amount of depression level 3.  Her daughter has been diagnosed with leukemia and she was a  healthy 78 year old  - Past medical history relevant ILD: No autoimmune disease no acid reflux no pulmonary hypertension.  She does have hypothyroidism.  She has had COVID boosters and then had COVID disease in 2024   - Review of systems positive for fatigue and arthralgia.  Also positive for dysphagia for the last several years if she eats rice.  She also has some dry eyes.  On 08/01/2023 she reported that fingers to turn blue occasionally when it is cold.   - Family history for lung disease: Mother had COPD  Personal exposure history started smoking 19 6 7  quit 20 1410 cigarettes/day.  She did smoke marijuana 1 time in 1980.  No cocaine use.   - Home and hobby details: [place] home in a single-family home 16 years house was built in 2008.  Extensive organic antigen history is negative except she does plant flowers and works with mulch and woodchips she does have 3 dogs inside but no birds   - Occupational history: She smoked and she grew up in a tobacco [place].  She worked as a Engineer, civil (consulting).  Detail organic and inorganic antigen exposure history is negative    CT Chest data from date: **  - personally visualized and independently interpreted : August 2024 personally visualized - my findings are: Postinflammatory atelectasis/linear scarring  Narrative & Impression  CLINICAL DATA:  Former smoker with 23 pack-year history   EXAM: CT CHEST WITHOUT CONTRAST LOW-DOSE FOR LUNG CANCER SCREENING   TECHNIQUE: Multidetector CT imaging of the chest was performed following the standard protocol without IV contrast.   RADIATION DOSE REDUCTION: This exam was performed according to the departmental dose-optimization program which includes automated exposure control, adjustment of the mA and/or kV according to patient size and/or use of iterative reconstruction technique.   COMPARISON:  Chest CT dated January 30, 2022   FINDINGS: Cardiovascular: Normal heart size. No pericardial effusion.  Normal caliber thoracic aorta with moderate calcified plaque. No visible coronary artery calcifications.   Mediastinum/Nodes: Esophagus and thyroid  are unremarkable. No enlarged lymph nodes seen in the chest.   Lungs/Pleura: Central airways are patent. No consolidation, pleural effusion or pneumothorax. Mild lower lung subpleural predominant reticular opacities, unchanged when compared with the prior exam. Stable solid nodule of the right lower lobe measuring 5.2 mm on image 157.   Upper Abdomen: No acute abnormality.   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 No aggressive appearing osseous lesions.   IMPRESSION: 1. Lung-RADS 2S, benign appearance or behavior. Continue annual screening with low-dose chest CT without contrast in 12 months. S modifier for interstitial lung changes. 2. Mild lower lung subpleural predominant reticular opacities, could be chronic sequela of prior infection, although interstitial lung disease could also have this appearance. Correlate with patient's systems and consider pulmonary function tests and/or ILD protocol CT for further evaluation. 3. Aortic Atherosclerosis (ICD10-I70.0).     Electronically Signed   By: Rea Marc M.D.   On: 03/04/2023 16:56    OV 08/01/2023  Subjective:  Patient ID: Pam Salazar, female , DOB: [birth year] , age 25 y.o. , MRN: [id number] , ADDRESS: [address] [place] [phone] PCP Hope Merle, MD Patient Care Team: Hope Merle, MD as PCP - General (Family Medicine) Dellie Louanne MATSU, MD as Consulting Physician (General Surgery) Glendia Shad, MD (Internal Medicine)  This Provider for this visit: Treatment Team:  Attending Provider: Geronimo Amel, MD    08/01/2023 -   Chief Complaint  Patient presents with   Follow-up     HPI Pam Salazar 78 y.o. -returns for follow-up to discuss the test results.   Physically she is doing well.  She is not climbing stairs anymore because of her knee but there is no interim shortness of breath or worsening of symptoms.  No emergency room visits or urgent care visits or changes to medications.  Psychologically she is struggling because her daughter has AML and is awaiting a bone marrow transplant in the next couple of months and after that patient will be the caretaker for the daughter in the house.  We reviewed her interstitial lung abnormality workup.  Her ANA is trace positive ANA is trace positive.  In further questioning she reported possible/likely Raynaud's.  Although otherwise she is feeling well.  There is no sicca symptoms.  There is no kidney issues.  We went over these test results.  Pulmonary function test shows a reduction in DLCO to 57%.  Otherwise has a normal spirometry.  We did walke her 200 feet with a forehead probe today and per CMA did NOT desaturate   OV 01/14/2024  Subjective:  Patient ID: Pam Salazar, female , DOB: 05-31-1946 , age 74 y.o. , MRN: [id number] , ADDRESS: [address] [place] [phone] PCP Hope Merle, MD (Inactive) Patient  Care Team: Hope Merle, MD (Inactive) as PCP - General (Family Medicine) Dellie Louanne MATSU, MD as Consulting Physician (General Surgery) Glendia Shad, MD (Internal Medicine)  This Provider for this visit: Treatment Team:  Attending Provider: Geronimo Amel, MD    01/14/2024 -   Chief Complaint  Patient presents with   Medical Management of Chronic Issues    HRCT done 12/24/23. Cough has been getting worse over the past month- non prod for the most part but will occ see some minimal dark green sputum.      HPI Pam Salazar 78 y.o. -follow-up isolated reduction in DLCO in a former smoker with ANA positivity and history of Raynaud's  Presents for follow-up.  Since her last visit no major new problems.  Overall she is the same.  She did have a biopsy of a skin in  the left infraclavicular area.  Her main symptom is that she continues to have cough.  She is also taking care of her daughter with leukemia.  Review of the labs indicate - CT scan high-resolution shows fine nodularity particular in the upper lobes.  It is bilateral and diffuse.  I agree with the differential diagnosis of hypersensitive pneumonitis and also RB-ILD although she has been quit smoking for more than 11 years.  She admits to significant amount of gardening in her life and exposure to damp soil and mulch and while growing up she was exposed to tobacco farm.  -ANA and ENA are trace positive.  She has a history of Raynaud's      CT Chest data from date: *  - personally visualized and independently interpreted :  Narrative & Impression  CLINICAL DATA:  Interstitial lung disease, persistent cough, former smoker   EXAM: CT CHEST WITHOUT CONTRAST   TECHNIQUE: Multidetector CT imaging of the chest was performed following the standard protocol without intravenous contrast. High resolution imaging of the lungs, as well as inspiratory and expiratory imaging, was performed.   RADIATION DOSE REDUCTION: This exam was performed according to the departmental dose-optimization program which includes automated exposure control, adjustment of the mA and/or kV according to patient size and/or use of iterative reconstruction technique.   COMPARISON:  02/21/2023   FINDINGS: Cardiovascular: Scattered aortic atherosclerosis. Normal heart size. No pericardial effusion.   Mediastinum/Nodes: No enlarged mediastinal, hilar, or axillary lymph nodes. Thyroid  gland, trachea, and esophagus demonstrate no significant findings.   Lungs/Pleura: Very extensive centrilobular nodularity throughout the lungs. Minimal, peripheral ground-glass and irregular interstitial opacity of the lung bases with mild associated dependent varicoid bronchiectasis (series 16, image 254). No significant air  trapping on expiratory phase imaging. No pleural effusion or pneumothorax.   Upper Abdomen: No acute abnormality.   Musculoskeletal: No chest wall abnormality. No acute osseous findings.   IMPRESSION: 1. Very extensive fine centrilobular nodularity throughout the lungs, most commonly seen in smoking-related respiratory bronchiolitis. However given extensive nodularity and relatively remote smoking cessation consider other nonspecific infectious or inflammatory inhalational lung disease such as hypersensitivity pneumonitis. 2. Minimal, peripheral ground-glass and irregular interstitial opacity of the lung bases with mild associated dependent varicoid bronchiectasis, findings suggesting minimal interstitial lung disease, likely smoking-related respiratory bronchiolitis-interstitial lung disease spectrum in this setting but technically characterized as an alternative diagnosis pattern by ATS criteria.   Aortic Atherosclerosis (ICD10-I70.0).     Electronically Signed   By: Marolyn JONETTA Jaksch M.D.   On: 12/31/2023 07:23      OV 02/26/2024  Subjective:  Patient ID: Pam Salazar, female ,  DOB: 1946-06-12 , age 36 y.o. , MRN: [id number] , ADDRESS: [address] [place] [id number] PCP Dineen Rollene MATSU, FNP Patient Care Team: Dineen Rollene MATSU, FNP as PCP - General (Family Medicine) Dellie Louanne MATSU, MD as Consulting Physician (General Surgery) Glendia Shad, MD (Internal Medicine)  This Provider for this visit: Treatment Team:  Attending Provider: Geronimo Amel, MD    02/26/2024 -   Chief Complaint  Patient presents with   Medical Management of Chronic Issues    Review bronch. She has occ dry cough.      HPI Pam Salazar 78 y.o. -  #Follow-up chronic cough with upper lobe micronodular changes concerning for smokers ILD [quit smoking] versus HP [has gotten history]  -Status post bronchoscopy on 02/10/2024.  A lot of mucus seen in the airways.  In  addition significant neutrophilic BAL.  However cultures are negative.  Transbronchial biopsy.  Which is to biopsy shows nonnecrotizing granuloma.  At this point in time cough still continues.  I gave her the diagnosis of subacute hypersensitive pneumonitis.  Presumably from gardening exposure.  Again went over her home exposure history.  There is no mold inside the house.  No mildew inside the house.  No down exposure.  No feathers.  Her last coding was in April 2025.  She says she continues to cough.  She finds it very difficult to bring up sputum.  We tried to get a 3% saline but the prescription did not go through.  The CMA has worked on this at this visit.  Past medical -02/12/2024 she also went post bronchoscopy to primary care because of fogginess.  But this is since resolved. - That she did go to the ER on 02/12/2024 with back pain.  She has got an MRI and she is following up with her orthopedic surgeon.  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 LUNG, LEFT UPPER LOBE, BIOPSY:  Nonnecrotizing granuloma.  Negative for malignancy.  See comment.   COMMENT:  There is benign lung tissue with a rare nonnecrotizing granuloma.  Special stains for microorganisms will be performed and reported as an  addendum.  No significant interstitial changes are noted in the small  amount of alveolar tissue present.     BAL 02/10/24  Latest Reference Range & Units 02/10/24 14:30  Monocyte-Macrophage-Serous Fluid 50 - 90 % 10 (L)  Other Cells, Fluid % CORRELATE WITH CYTOLOGY.  Color, Fluid YELLOW  COLORLESS !  Total Nucleated Cell Count, Fluid 0 - 1,000 cu mm 600  Fluid Type-FCT  Bronch Lavag  Lymphs, Fluid % 2  Eos, Fluid % 0  Appearance, Fluid CLEAR  HAZY !  Neutrophil Count, Fluid 0 - 25 % 88 (H)           Latest Ref Rng & Units 12/31/2023   12:56 PM 06/26/2023    1:00 PM  PFT Results  FVC-Pre L 3.35  3.24   FVC-Predicted Pre % 134  129   FVC-Post L  3.35    FVC-Predicted Post %  134   Pre FEV1/FVC % % 76  76   Post FEV1/FCV % %  79   FEV1-Pre L 2.55  2.46   FEV1-Predicted Pre % 137  132   FEV1-Post L  2.64   DLCO uncorrected ml/min/mmHg 10.45  10.25   DLCO UNC% % 58  57   DLCO corrected ml/min/mmHg 10.45  10.25   DLCO COR %Predicted %  58  57   DLVA Predicted % 51  49   TLC L  5.41   TLC % Predicted %  113   RV % Predicted %  93        LAB RESULTS last 96 hours No results found.       has a past medical history of Arthritis, Basal cell carcinoma, Breast lump (10/21/2011), Depression, Fracture of rib of right side (08/17/2013), Hypercholesterolemia, Hypothyroidism, Low blood pressure (10/25/2014), Panic disorder, Seizures (HCC), Shoulder pain, left, Squamous cell carcinoma of skin, Squamous cell carcinoma of skin (08/07/2022), Vertigo, and Vitamin D  deficiency.   reports that she has quit smoking. Her smoking use included cigarettes. She quit smokeless tobacco use about 10 years ago.  Past Surgical History:  Procedure Laterality Date   ABDOMINAL HYSTERECTOMY  1981   partial - secondary to bleeding, ovaries not removed -  vaginal   BRONCHIAL BIOPSY  02/10/2024   Procedure: BRONCHOSCOPY, WITH BIOPSY;  Surgeon: Geronimo Amel, MD;  Location: [facility];  Service: Endoscopy;;   BRONCHIAL WASHINGS  02/10/2024   Procedure: IRRIGATION, BRONCHUS;  Surgeon: Geronimo Amel, MD;  Location: [facility];  Service: Endoscopy;;   CATARACT EXTRACTION W/ INTRAOCULAR LENS  IMPLANT, BILATERAL     COLONOSCOPY WITH PROPOFOL  N/A 05/05/2015   Procedure: COLONOSCOPY WITH PROPOFOL ;  Surgeon: Rogelia Copping, MD;  [facility];  Service: Endoscopy;  Laterality: N/A;   COLONOSCOPY WITH PROPOFOL  N/A 06/26/2021   Procedure: COLONOSCOPY WITH PROPOFOL ;  Surgeon: Therisa Bi, MD;  Location: [facility];  Service: Gastroenterology;  Laterality: N/A;   JOINT REPLACEMENT  09/28/2020   MASS EXCISION Right 12/24/2017   Procedure: EXCISION  MASS ( SHIN );  Surgeon: Jama Cordella MATSU, MD;  Location: [facility] ORS;  Service: Vascular;  Laterality: Right;   ORIF FIBULA FRACTURE Right 2003   right lower extremitiy   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   shoulder Left    bone suprs   SHOULDER SURGERY  12/2015   TONSILLECTOMY  1974   TOTAL HIP ARTHROPLASTY Right 03/2023   VIDEO BRONCHOSCOPY N/A 02/10/2024   Procedure: BRONCHOSCOPY, WITH FLUOROSCOPY;  Surgeon: Geronimo Amel, MD;  Location: [facility];  Service: Endoscopy;  Laterality: N/A;    Allergies  Allergen Reactions   Duloxetine Other (See Comments)    duloxetine   Duloxetine Hcl Other (See Comments)    depression worsened   Ezetimibe-Simvastatin  Other (See Comments)    Joint pain   Hydroxyzine  Other (See Comments)    Caused pt to be dizzy.    Immunization History  Administered Date(s) Administered    sv, Bivalent, Protein Subunit Rsvpref,pf (Abrysvo) 05/29/2022   Fluad Quad(high Dose 65+) 05/29/2022   INFLUENZA, HIGH DOSE SEASONAL PF 05/19/2018, 02/25/2019, 03/12/2021   Influenza Split 06/28/2013, 05/27/2014, 04/21/2017   Influenza-Unspecified 05/25/2015, 05/24/2017, 03/13/2020, 05/29/2022   Moderna Covid-19 Fall Seasonal Vaccine 57yrs & older 07/09/2022   PFIZER(Purple Top)SARS-COV-2 Vaccination 08/06/2019, 08/31/2019, 03/13/2020, 11/30/2020, 03/12/2021   Pfizer Covid-19 Vaccine Bivalent Booster 41yrs & up 07/09/2021   Pneumococcal Conjugate-13 08/03/2013   Pneumococcal Polysaccharide-23 09/14/2012, 07/11/2015   Respiratory Syncytial Virus Vaccine,Recomb Aduvanted(Arexvy) 05/29/2022   Tdap 02/25/2019   Unspecified SARS-COV-2 Vaccination 03/13/2020   Zoster Recombinant(Shingrix) 07/09/2022, 10/17/2022   Zoster, Live 06/24/2008    Family History  Problem Relation Age of Onset   Arthritis Mother    Hyperlipidemia Mother    Cancer Mother    Vision loss Mother    Arthritis Father    Prostate cancer Father  Cancer Father    Vision loss Father     Breast cancer Sister 36   Heart disease Brother        heart attack   Diabetes Brother    Cancer Brother    Breast cancer Maternal Aunt 30   Colon cancer Maternal Uncle    Leukemia Daughter        s/p stem cell transplant   Cancer Sister      Current Outpatient Medications:    busPIRone  (BUSPAR ) 7.5 MG tablet, Take 1 tablet (7.5 mg total) by mouth 3 (three) times daily., Disp: 180 tablet, Rfl: 2   clonazePAM  (KLONOPIN ) 1 MG tablet, TAKE 1 TABLET BY MOUTH 2 TIMES A DAY AS NEEDED FOR ANXIETY, Disp: 60 tablet, Rfl: 5   Multiple Vitamins-Minerals (PRESERVISION AREDS 2 PO), Take 1 capsule by mouth 2 (two) times daily. , Disp: , Rfl:    Omega 3 1000 MG CAPS, Take by mouth daily., Disp: , Rfl:    predniSONE  (DELTASONE ) 10 MG tablet, 4 daily x 2 weeks, 3 daily x 2 weeks, 2 daily x 2 weeks, 1 daily x 2 weeks,1/2 daily x 2 weeks, 1/2 M, W, F x 2 weeks, then stop, Disp: 150 tablet, Rfl: 0   sertraline  (ZOLOFT ) 100 MG tablet, Take 2 tablets (200 mg total) by mouth daily., Disp: 180 tablet, Rfl: 1   simvastatin  (ZOCOR ) 20 MG tablet, Take 1 tablet (20 mg total) by mouth every evening., Disp: 90 tablet, Rfl: 1   sodium chloride  HYPERTONIC 3 % nebulizer solution, Take by nebulization as needed for other. Diagnosis code:  R05.3, R91.8, Disp: 750 mL, Rfl: 3   levothyroxine  (SYNTHROID ) 125 MCG tablet, Take 1 tablet (125 mcg total) by mouth daily before breakfast., Disp: 90 tablet, Rfl: 3      Objective:   Vitals:   02/24/24 1521  BP: (!) 98/58  Pulse: 69  SpO2: 97%  Weight: 124 lb (56.2 kg)  Height: 5' 3 (1.6 m)    Estimated body mass index is 21.97 kg/m as calculated from the following:   Height as of this encounter: 5' 3 (1.6 m).   Weight as of this encounter: 124 lb (56.2 kg).  @WEIGHTCHANGE @  [employer]   02/24/24 1521  Weight: 124 lb (56.2 kg)     Physical Exam   General: No distress. Looks well O2 at rest: no Cane present: no Sitting in wheel chair: no Frail:  no Obese: no Neuro: Alert and Oriented x 3. GCS 15. Speech normal Psych: Pleasant Resp:  Barrel Chest - no.  Wheeze - no, Crackles - no, No overt respiratory distress CVS: Normal heart sounds. Murmurs - no Ext: Stigmata of Connective Tissue Disease - no HEENT: Normal upper airway. PEERL +. 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 Activity involving gardening or landscaping  Y93.H2     4. Chronic cough  R05.3     5. Quit smoking  Z87.891  There is definitely upper lobe bilateral interstitial lung abnormalities.   Biopsy 02/10/24 shows acute and chronic inflammtion with granuloma (TB test negative).. Concer is for sub-acute hypersensitivity pneumonitis due to gardening. Culture negative     Plan - Avoid gardening or mold exposure - start steroids  - - Start prednisone  -40 mg once daily for 2 weeks and then 30 mg once daily for 2 weeks and then 20 mg once daily for 2 weeks and then 10 mg once daily for 2 weeks then 5 mg once daily for 2 weeks and then 5 mg on Monday Wednesday Friday for 2 weeks and stop (Total 12 week course, Dispense 150 of 10mg  tabs)   - get PCP Dineen Rollene MATSU, FNP tp monitor your BP, sugar, bone health  - Start 3% hypertonic saline nebulizer per schedule and protocol to help mucociliary clearance of thick mucus seen on bronchoscopy.  -do spiro and dlco in 4-6  weeks   Follow-up - let us  know any time if there are side effects with steroids - Return in 4-6 weeks 30 min visit to  discuss course     FOLLOWUP    Return in about 5 weeks (around 03/30/2024) for 30 min visit, Face to Face Visit, with any of the APPS, with Dr Geronimo.    SIGNATURE    Dr. Dorethia Geronimo, M.D., F.C.C.P,  Pulmonary and Critical Care Medicine Staff Physician, [facility] Center Director - Interstitial Lung Disease  Program  Pulmonary Fibrosis [facility] at [facility] [place], [place], [address]  Pager: [phone], If no answer or between  15:00h - 7:00h: call [phone] Telephone: [phone]  3:20 PM 02/26/2024

## 2024-02-26 ENCOUNTER — Telehealth: Payer: Self-pay | Admitting: Internal Medicine

## 2024-02-26 ENCOUNTER — Other Ambulatory Visit: Payer: Self-pay

## 2024-02-26 DIAGNOSIS — E039 Hypothyroidism, unspecified: Secondary | ICD-10-CM

## 2024-02-26 MED ORDER — LEVOTHYROXINE SODIUM 125 MCG PO TABS: 125.0000 ug | ORAL_TABLET | Freq: Every day | ORAL | 3 refills | Status: DC

## 2024-02-26 NOTE — Telephone Encounter (Signed)
 done

## 2024-02-26 NOTE — Telephone Encounter (Signed)
 Pam Salazar Pam Salazar; [name], [name]; [name], [name]; Cain, [place]; 1 other We are needing a narrative entered into the providers OV note please mentioning the need for the neb

## 2024-02-27 DIAGNOSIS — M5416 Radiculopathy, lumbar region: Secondary | ICD-10-CM | POA: Diagnosis not present

## 2024-02-27 NOTE — Telephone Encounter (Signed)
 Sent to DME [facility]

## 2024-02-28 ENCOUNTER — Encounter: Payer: Self-pay | Admitting: Family

## 2024-03-01 ENCOUNTER — Telehealth: Payer: Self-pay | Admitting: Internal Medicine

## 2024-03-01 DIAGNOSIS — J849 Interstitial pulmonary disease, unspecified: Secondary | ICD-10-CM

## 2024-03-01 DIAGNOSIS — R053 Chronic cough: Secondary | ICD-10-CM

## 2024-03-01 NOTE — Telephone Encounter (Addendum)
 Left message to return call to our office.  Want to follow up with Pt.  Pt called back and Pt is not having SOB. She stated that she has Palpation every now and then but that has been going on for a while. No chest pain. I did advised ER if any of the symptoms occurs. Did advise his SOB or Chest pain occur to go to the ED. Pt scheduled appointment for 10/2 @12 :00 pm

## 2024-03-01 NOTE — Telephone Encounter (Signed)
 Can you try adding ILD- J84.9? Do you need me to place a new order?

## 2024-03-01 NOTE — Telephone Encounter (Signed)
 Per Avelina at [employer]-  The DX codes are not covered based on Medicare Guidelines. Can that be updated?  Please advise

## 2024-03-01 NOTE — Telephone Encounter (Signed)
 Would you place a new order?

## 2024-03-02 ENCOUNTER — Telehealth: Payer: Self-pay | Admitting: *Deleted

## 2024-03-02 NOTE — Telephone Encounter (Signed)
 Will do. Will send as soon as it has been signed

## 2024-03-02 NOTE — Telephone Encounter (Signed)
 Copied from CRM [id number]. Topic: Clinical - Order For Equipment >> Mar 02, 2024 10:18 AM Leila C wrote: Reason for CRM: Patient [phone] is checking on the status of nebulizer machine, patient needs it and has not heard from anyone. Patient states nebulizer treatment would help with her mucous. Patient denies cough, shortness of breath, fever, wheezing, nor pain. Patient states feels bad today, low energy, not a lot of pep. Patient states Prednisone  is working but it's so strong. Per CAL, send crm to clinical. Please advise and call back.  Call returned to the pt- please refer to phone encounter from 03/01/24

## 2024-03-02 NOTE — Telephone Encounter (Signed)
 Spoke to pt she stated that she is not having any cp, sob, hp sometimes but nothing new, scheduled her an appt on 03/03/24 to see Dr Onesimo, in person to be evaluated to see why she is so tired

## 2024-03-02 NOTE — Telephone Encounter (Signed)
 New order placed for neb with ild dx code  I am not sure if this will be accepted  I called and spoke with the pt and let her know that we are resubmitting neb referral with new dx  Please let clinical know if the dx will not work, thanks!

## 2024-03-03 ENCOUNTER — Ambulatory Visit (INDEPENDENT_AMBULATORY_CARE_PROVIDER_SITE_OTHER): Admitting: Internal Medicine

## 2024-03-03 ENCOUNTER — Ambulatory Visit: Payer: Self-pay | Admitting: Family

## 2024-03-03 ENCOUNTER — Other Ambulatory Visit: Payer: Self-pay

## 2024-03-03 ENCOUNTER — Encounter: Payer: Self-pay | Admitting: Internal Medicine

## 2024-03-03 VITALS — BP 130/82 | HR 66 | Temp 97.8°F | Ht 63.0 in | Wt 125.4 lb

## 2024-03-03 DIAGNOSIS — R7302 Impaired glucose tolerance (oral): Secondary | ICD-10-CM | POA: Insufficient documentation

## 2024-03-03 DIAGNOSIS — J849 Interstitial pulmonary disease, unspecified: Secondary | ICD-10-CM

## 2024-03-03 DIAGNOSIS — F39 Unspecified mood [affective] disorder: Secondary | ICD-10-CM

## 2024-03-03 LAB — FUNGUS CULTURE RESULT

## 2024-03-03 LAB — FUNGUS CULTURE WITH STAIN

## 2024-03-03 LAB — GLUCOSE, POCT (MANUAL RESULT ENTRY): POC Glucose: 144 mg/dL — AB (ref 70–99)

## 2024-03-03 LAB — FUNGAL ORGANISM REFLEX

## 2024-03-03 MED ORDER — CLONAZEPAM 1 MG PO TABS: 1.0000 mg | ORAL_TABLET | Freq: Two times a day (BID) | ORAL | 0 refills | Status: DC

## 2024-03-03 MED ORDER — SODIUM CHLORIDE 3 % IN NEBU: INHALATION_SOLUTION | RESPIRATORY_TRACT | 3 refills | Status: AC | PRN

## 2024-03-03 MED ORDER — SODIUM CHLORIDE 3 % IN NEBU: INHALATION_SOLUTION | RESPIRATORY_TRACT | 3 refills | Status: DC | PRN

## 2024-03-03 NOTE — Patient Instructions (Addendum)
-   It was a pleasure meeting you today -We will check your blood sugar today given that you are on prednisone  -Your blood pressure is doing well on prednisone  (at 130/82 today) -You have worsening anxiety likely secondary to the prednisone .  If you have significant increased anxiety symptoms you can take hydroxyzine  at night as needed.  If you are taking hydroxyzine  please do not take Tylenol  PM - Please continue with your current medications for anxiety including sertraline , buspirone  and Klonopin  -Please contact us  with any questions or concerns or if you need any refills

## 2024-03-03 NOTE — Progress Notes (Signed)
 Rx sent to pharmacy. See 9/10 encounter. Unable to send script in triage encounter.

## 2024-03-03 NOTE — Assessment & Plan Note (Signed)
-   This problem is chronic and stable. -Patient followed up with pulmonology and was diagnosed with hypersensitivity pneumonitis -Was started on a steroid taper and is currently on prednisone  40 mg daily.  She will continue her steroid taper to complete a 48-month course -She was noted to have decreased DLCO but normal O2 sats -She will repeat her PFTs after she finishes her course of prednisone  -No further workup at this time

## 2024-03-03 NOTE — Progress Notes (Signed)
 Acute Office Visit  Subjective:     Patient ID: Pam Salazar, female    DOB: 16-Jan-1946, 78 y.o.   MRN: [id number]  Chief Complaint  Patient presents with   Acute Visit    Check her SpO2, lungs and BP    Discussed the use of AI scribe software for clinical note transcription with the patient, who gave verbal consent to proceed.  History of Present Illness Pam Salazar is a 78 year old female with hypersensitivity pneumonitis who presents with anxiety and mood changes due to prednisone  treatment.  Mood disturbance and anxiety - Increased anxiety and mood swings since starting prednisone  10 days ago for hypersensitivity pneumonitis - Current prednisone  dose is 40 mg daily, with a tapering schedule over 2 months - Current anxiety medications include sertraline , buspirone  three times daily, and clonazepam   - Family stressors include oldest daughter diagnosed with acute myeloid leukemia last October (now in remission after bone marrow transplant) and concerns regarding granddaughter's behavior - Family stress contributes to anxiety  Dyspnea - Shortness of breath breath over the last day or 2 particularly when walking dogs and during phone conversations (attributes this to anxiety)   Insomnia - Takes 5 mg melatonin and Tylenol  PM nightly to aid sleep    Review of Systems  Constitutional: Negative.   HENT: Negative.    Respiratory:  Positive for shortness of breath.        Patient complains of occasional dyspnea since starting prednisone  (while walking dogs as well as sometimes while on the phone)  Cardiovascular: Negative.   Neurological: Negative.   Psychiatric/Behavioral:  The patient is nervous/anxious.        Complains of increased anxiety and mood swings since being on prednisone         Objective:    BP 130/82   Pulse 66   Temp 97.8 F (36.6 C)   Ht 5' 3 (1.6 m)   Wt 125 lb 6.4 oz (56.9 kg)   SpO2 98%   BMI 22.21 kg/m    Physical  Exam Constitutional:      Appearance: Normal appearance.  HENT:     Head: Normocephalic and atraumatic.  Cardiovascular:     Rate and Rhythm: Normal rate and regular rhythm.     Heart sounds: Normal heart sounds.  Pulmonary:     Effort: Pulmonary effort is normal. No respiratory distress.     Breath sounds: Normal breath sounds. No wheezing, rhonchi or rales.  Chest:     Chest wall: No tenderness.  Abdominal:     General: Bowel sounds are normal. There is no distension.     Palpations: Abdomen is soft.     Tenderness: There is no abdominal tenderness. There is no guarding or rebound.  Musculoskeletal:        General: No swelling or tenderness.     Right lower leg: No edema.     Left lower leg: No edema.  Neurological:     Mental Status: She is alert.  Psychiatric:        Mood and Affect: Mood normal.        Behavior: Behavior normal.     Results for orders placed or performed in visit on 03/03/24  POCT Glucose (CBG)  Result Value Ref Range   POC Glucose 144 (A) 70 - 99 mg/dl        Assessment & Plan:   Problem List Items Addressed This Visit       Respiratory  ILD (interstitial lung disease) (HCC)   - This problem is chronic and stable. -Patient followed up with pulmonology and was diagnosed with hypersensitivity pneumonitis -Was started on a steroid taper and is currently on prednisone  40 mg daily.  She will continue her steroid taper to complete a 74-month course -She was noted to have decreased DLCO but normal O2 sats -She will repeat her PFTs after she finishes her course of prednisone  -No further workup at this time        Endocrine   Impaired glucose tolerance - Primary   - Patient on prednisone  currently and will be on a 88-month taper -CBG done today was 144 which is acceptable. -Will continue to monitor -No further workup at this time      Relevant Orders   POCT Glucose (CBG) (Completed)     Other   Mood disorder (HCC)   - Patient with a  history of anxiety and depression -Patient with significantly worsened anxiety and depression since starting her prednisone  taper -Patient with a GAD-7 score of 19 and PHQ-9 score of 10 -Will continue with current anxiety treatment with Klonopin  1 mg twice daily, BuSpar  3 times daily as well as Zoloft  200 mg daily -Will refill Klonopin  today for the patient -If patient does have significant worsening anxiety especially at night I have advised her to take hydroxyzine  instead of her Tylenol  PM -No further workup at this time -Outpatient follow-up in 2 weeks to see if this is improving      Relevant Medications   clonazePAM  (KLONOPIN ) 1 MG tablet    Meds ordered this encounter  Medications   clonazePAM  (KLONOPIN ) 1 MG tablet    Sig: Take 1 tablet (1 mg total) by mouth 2 (two) times daily.    Dispense:  60 tablet    Refill:  0    No follow-ups on file.  Famous Eisenhardt, MD

## 2024-03-03 NOTE — Telephone Encounter (Signed)
 Please see 9/10 encounter.  Nothing further needed.

## 2024-03-03 NOTE — Telephone Encounter (Signed)
 Called and spoke with the patient. Pt states she is having trouble taking deep breath & SOB. She states this is because she is unable to use nebulizer machine. Pts insurance is giving her trouble with machine and pt is going to buy it out of pocket. I advised pt she could buy her own nebulizer machine out of pocket if that's what she wanted to do.  Pt is going to see PCP today.  I have re-sent nebulizer rx to pt preferred pharmacy.  Pt is not acutely ill and denies chest pain.  Nothing further needed.

## 2024-03-03 NOTE — Assessment & Plan Note (Signed)
-   Patient on prednisone  currently and will be on a 65-month taper -CBG done today was 144 which is acceptable. -Will continue to monitor -No further workup at this time

## 2024-03-03 NOTE — Telephone Encounter (Signed)
 FYI Only or Action Required?: Action required by provider: clinical question for provider.  Patient is followed in Pulmonology for hypersensitivity pneumonitis, last seen on 02/24/2024 by Geronimo Amel, MD.  Called Nurse Triage reporting Advice Only.  Triage Disposition: Call PCP When Office is Open  Patient/caregiver understands and will follow disposition?: Yes     Copied from CRM #[id number]. Topic: Clinical - Red Word Triage >> Mar 03, 2024 10:58 AM Pam Salazar wrote: Red Word that prompted transfer to Nurse Triage: pt cannot get a deep breath.  Insurance will not pay for a nebulizer. She wants to know if she can pick up any nebulizer at [facility]? Reason for Disposition  [1] Caller requesting NON-URGENT health information AND [2] PCP's office is the best resource  Answer Assessment - Initial Assessment Questions Pt states her insurance won't pay for her nebulizer. Pt is also taking prednisone  and is not sure if this could be causing her SOB as she is anxious. Pt wants to know what kind of OTC nebulizer she should buy for symptoms. This RN will send a high priority message to pulmonologist. Pt call back number is [phone].  Pt also states she is going to PCP office today.   RESPIRATORY STATUS: Describe your breathing? (e.g., wheezing, shortness of breath, unable to speak, severe coughing)      A tiny bit of SOB after walking dog outside; no difficulty breathing right now or when at rest  Protocols used: Information Only Call - No Triage-A-AH

## 2024-03-03 NOTE — Assessment & Plan Note (Signed)
-   Patient with a history of anxiety and depression -Patient with significantly worsened anxiety and depression since starting her prednisone  taper -Patient with a GAD-7 score of 19 and PHQ-9 score of 10 -Will continue with current anxiety treatment with Klonopin  1 mg twice daily, BuSpar  3 times daily as well as Zoloft  200 mg daily -Will refill Klonopin  today for the patient -If patient does have significant worsening anxiety especially at night I have advised her to take hydroxyzine  instead of her Tylenol  PM -No further workup at this time -Outpatient follow-up in 2 weeks to see if this is improving

## 2024-03-03 NOTE — Telephone Encounter (Signed)
 Pt states insurance will not pay for a nebulizer. She wants to know if she can p/u any nebulizer from [facility] or any other place you may know of.  Pt states she really needs a nebulizer. Pt is not in distress, but feels like she can not get the deepest breath that she would like to.  Please advise asap, so that she may get one today.

## 2024-03-03 NOTE — Telephone Encounter (Signed)
 clonazePAM  (KLONOPIN ) 1 MG tablet #60 RF 5 sent in on 09/05/2023  LOV 12/03/2023 NOV 11/11/2024

## 2024-03-04 ENCOUNTER — Encounter: Payer: Self-pay | Admitting: *Deleted

## 2024-03-05 ENCOUNTER — Telehealth: Payer: Self-pay | Admitting: Family

## 2024-03-05 ENCOUNTER — Telehealth: Payer: Self-pay

## 2024-03-05 NOTE — Progress Notes (Signed)
 This encounter was created in error - please disregard.

## 2024-03-05 NOTE — Telephone Encounter (Signed)
 Called pt and she advised her it was filed on 03/03/2024, and she stated that she would call the pharmacy. If she has any trouble she will give us  a call back.

## 2024-03-05 NOTE — Telephone Encounter (Signed)
 Copied from CRM [id number]. Topic: Clinical - Prescription Issue >> Mar 03, 2024  2:18 PM Joesph PARAS wrote: Reason for CRM: Sherri with pharmacy is calling to state they cannot fill sodium chloride  rx until parameters for how many vials can be used daily is specified. Please clarify and resend rx. Alternatively, call back at [phone].    Spoke w/ pharmacy updated w/ suggested dosage etc   -NFN

## 2024-03-05 NOTE — Telephone Encounter (Signed)
 Call patient I received a refill request for Klonopin  1 mg twice daily.  Looks like this was last filled 02/06/2024.  Also see that Dr. Onesimo refilled during his office visit.  Please confirm if patient has.  She will need a 9-month follow-up with me for continuation of controlled substance.  Please schedule 3 months from 03/03/24 appointment.

## 2024-03-09 NOTE — Telephone Encounter (Signed)
  BAL growing PENICILLIUM which is a mold - Species of Penicillium are s soil fungi preferring cool and moderate climates, commonly present wherever organic material is available   With the granuloam in biopsy   Suspect all this is from gardening  PLAN  - please ask if she is better with steroid OR NOT better ?  OR worse?  - based on this will need to decide what to do with steroids  - also refer ID  - to srt out infection v inflammation (order done)      Ref Range & Units (hover) 4 wk ago  Fungal result 1 Penicillium species Abnormal   Comment: (NOTE) Performed At: [facility] [address] [place], [place] [id number] Jennette Shorter MD Ey:[phone]

## 2024-03-10 ENCOUNTER — Telehealth: Payer: Self-pay

## 2024-03-10 ENCOUNTER — Ambulatory Visit: Payer: Self-pay | Admitting: Internal Medicine

## 2024-03-10 ENCOUNTER — Telehealth: Payer: Self-pay | Admitting: Family

## 2024-03-10 ENCOUNTER — Telehealth: Payer: Self-pay | Admitting: Internal Medicine

## 2024-03-10 DIAGNOSIS — F39 Unspecified mood [affective] disorder: Secondary | ICD-10-CM

## 2024-03-10 DIAGNOSIS — J679 Hypersensitivity pneumonitis due to unspecified organic dust: Secondary | ICD-10-CM

## 2024-03-10 DIAGNOSIS — R053 Chronic cough: Secondary | ICD-10-CM

## 2024-03-10 DIAGNOSIS — J849 Interstitial pulmonary disease, unspecified: Secondary | ICD-10-CM

## 2024-03-10 MED ORDER — CLONAZEPAM 1 MG PO TABS: 1.0000 mg | ORAL_TABLET | Freq: Two times a day (BID) | ORAL | 2 refills | Status: DC

## 2024-03-10 NOTE — Telephone Encounter (Signed)
 Appt 03/24/24 Klonopin  Filled 03/05/24  Sent rx for 04/04/24

## 2024-03-10 NOTE — Telephone Encounter (Signed)
 Pt is experiencing chest tightness and discomfort/SOB. Please contact her today if possible.

## 2024-03-10 NOTE — Telephone Encounter (Signed)
 FYI Only or Action Required?: Action required by provider: Nurse scheduling system error, warm transferred to CAL to schedule, needs further recommendations as well as earlier appt.  Patient is followed in Pulmonology for ILD, last seen on 02/24/2024 by Pam Amel, MD.  Called Nurse Triage reporting Results, Chest Pain, Shortness of Breath, and chest congestion.  Symptoms began several weeks ago.  Interventions attempted: Other: just able to get nebulizer today not used yet, on high-dose prednisone .  Symptoms are: gradually worsening.  Triage Disposition: See HCP Within 4 Hours (Or PCP Triage)  Patient/caregiver understands and will follow disposition?: Yes     Copied from CRM #[id number]. Topic: Clinical - Lab/Test Results >> Mar 10, 2024  1:49 PM Nathanel DEL wrote: Reason for CRM: pt calling for lab results Reason for Disposition  [1] Longstanding difficulty breathing (e.g., CHF, COPD, emphysema) AND [2] WORSE than normal  Answer Assessment - Initial Assessment Questions Informed pt of 9/16 note from Dr. Geronimo per pt questions about 8/19 results. Advised pt be examined today if possible since some worsening symptoms. Nurse scheduling system only showing pt as new pt though she is established pt, warm transferred to CAL for scheduling, pt speaking to CAL to find earliest available appt. Advised call back if any worsening.     Lab results for 8/19 all stated Called patient, left message on voice mail regarding  BRONCHIAL ALVEOLAR LAVAGE lab result per Dr. Geronimo.  (See plan note).  DR. REEVES 9/16 NOTE Signed   BAL growing PENICILLIUM which is a mold - Species of Penicillium are s soil fungi preferring cool and moderate climates, commonly present wherever organic material is available   With the granuloam in biopsy    Suspect all this is from gardening   PLAN  - please ask if she is better with steroid OR NOT better ?  OR worse?  - based on this will need to  decide what to do with steroids  - also refer ID  - to srt out infection v inflammation (order done)    Ref Range & Units (hover) 4 wk ago Fungal result 1 Penicillium species Abnormal  Comment: (NOTE) Performed At: [facility] [address] [place], [place] [id number] Jennette Shorter MD Ey:[phone]  Electronically signed by Pam Amel, MD at 03/09/2024  7:27 AM     E2C2 Pulmonary Triage - Initial Assessment Questions Chief Complaint (e.g., cough, sob, wheezing, fever, chills, sweat or additional symptoms) *Go to specific symptom protocol after initial questions. Read the results, but don't understand them, understand have fungus growing in alveoli fungus smear was positive Do have appt 9/29 with infectious disease Chest is tight, worse than usual Just today got nebulizer and had to go out and get one, never arrived from company, went to pharmacy yesterday and got the saline, as soon as can read the instructions, can have first nebulizer treatment, should have had it a month ago More SOB than usual lately Don't know if anxiety or breathing On 45 mg prednisone  last day of 40, get to cut down to 30 tomorrow, more than ever had in my life No chest pain or dizziness, weakness 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 LUNG HISTORY: Do you have any history of lung disease?  (e.g., pulmonary embolus, asthma, emphysema)     ILD  Protocols used: Breathing Difficulty-A-AH

## 2024-03-10 NOTE — Telephone Encounter (Signed)
 Called patient.  This has been resolved in telephone contact 03/10/2024.  Will close encounter.

## 2024-03-10 NOTE — Telephone Encounter (Signed)
 Called patient.  Patient states her husband had gotten her a refurbished nebulizer where he works.  The machine did not work.  Patient now needs a rx for a new nebulizer with all supplies to go to [facility] [phone] - as an urgent order.  Per Dr. Fernande OV note from 02/24/2024:  Start 3% hypertonic saline nebulizer per schedule and protocol to help mucociliary clearance of thick mucus seen on bronchoscopy.   Sent in DME order.  Patient informed.

## 2024-03-10 NOTE — Telephone Encounter (Signed)
 Called patient.  Patient states she just got her nebulizer and medication for the neb.  She used the machine a few minutes ago and feel much better.  She states no SOB or chest tightness, no cough or wheeze and SaO2 is 96%.  Patient is very happy with the nebulizer.  Patient states she does not need any medical advice or attention at this time.  Will close encounter.

## 2024-03-10 NOTE — Telephone Encounter (Signed)
 Copied from CRM [id number]. Topic: Clinical - Order For Equipment >> Mar 10, 2024  4:10 PM Isabell A wrote: Reason for CRM: Patient is calling requesting a prescription for her nebulizer without a face mask sent to [facility] [phone] - requesting this be done right now so she can go to the pharmacy by 7pm.. Advised this may be completed tomorrow morning.  Routing to Amy and she stated she would call her.

## 2024-03-11 ENCOUNTER — Telehealth: Payer: Self-pay | Admitting: Internal Medicine

## 2024-03-11 NOTE — Telephone Encounter (Signed)
 Per Avelina at [employer], DX codes are not covered under the Medicare guidelines. I also am not seeing any DX codes that would work

## 2024-03-12 NOTE — Telephone Encounter (Signed)
 Pam Salazar EA   03/11/24  4:13 PM Note Per Avelina at [facility], DX codes are not covered under the Medicare guidelines. I also am not seeing any DX codes that would work      Patient knows her insurance will not cover a nebulizer and is willing to pay out of pocket for a nebulizer with supplies.  All patient needs is for us  to send the order in to [facility].  The pharmacy number is [phone].  Please notify the patient once this is completed. Thank you.

## 2024-03-12 NOTE — Telephone Encounter (Signed)
 Per Avelina at [employer], DX codes are not covered under the Medicare guidelines. I also am not seeing any DX codes that would work

## 2024-03-12 NOTE — Telephone Encounter (Signed)
 Please see telephone contact from 03/10/2024.  Can close this encounter.  Patient is paying out of pocket.

## 2024-03-12 NOTE — Telephone Encounter (Addendum)
 Hello [facility] team, Can you check on order for nebulizer?  Order put in 03/10/2024 as urgent.  Patient states she has not received machine as of this morning.  Please advise.  Thank you.  Update!    I'm sorry,  I just saw where there is a problem with the diagnosis codes.  Let me check.  Thank you

## 2024-03-12 NOTE — Telephone Encounter (Signed)
 Called [facility].  Spoke with Grayce, pharmacist.  Gave verbal order for nebulizer and all supplies.  Called patient.  Gave information.  Patient will go by [facility] and pick up her nebulizer today.  Informed patient to call to give update that she received her equipment.

## 2024-03-12 NOTE — Telephone Encounter (Signed)
 Patient returning call she received about do she have a nebulizer and patient say she dont have one and will do a little research to find a pharmacy . [facility]  [phone] pharmacy number for nurse amy to reach out to pharmacy

## 2024-03-12 NOTE — Telephone Encounter (Signed)
 Called patient for follow up to confirm that patient did receive her nebulizer.  Left message on VM.

## 2024-03-17 NOTE — Telephone Encounter (Signed)
 Pam Salazar  You ae seeing her 03/22/24. ID did a bronch on her and showed granuloma. 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 Clincially it is hypersensitivity pneumonitis but I have not seen the mold grow in culture. So she is seeing you. Still on steroids  Thanks  MR

## 2024-03-17 NOTE — Progress Notes (Signed)
 I have personally communicated yhour referral to Dr Fleeta Rothman.

## 2024-03-21 ENCOUNTER — Encounter: Payer: Self-pay | Admitting: Infectious Disease

## 2024-03-21 DIAGNOSIS — Z77120 Contact with and (suspected) exposure to mold (toxic): Secondary | ICD-10-CM | POA: Insufficient documentation

## 2024-03-21 NOTE — Progress Notes (Unsigned)
 Reason for Infetious Disease Consult: penicillium ID on BAL from diagnostic bronchoscopy  Requesting Physician: Dorethia Cave, MD  Subjective:    Patient ID: Pam Salazar, female    DOB: 1946/06/10, 78 y.o.   MRN: [id number]  HPI  Discussed the use of AI scribe software for clinical note transcription with the patient, who gave verbal consent to proceed.  History of Present Illness   Pam Salazar is a 78 year old female with hypersensitivity pneumonitis who presents for evaluation of a positive penicillium culture from bronchoscopy. She was referred by Dr. Cave for evaluation of a positive penicillium culture from bronchoscopy.  She has a history of chronic cough with interstitial infiltrates and nodules on CT scan. She underwent bronchoscopy with transbronchial biopsy and cultures, which revealed granulomatous pathology, leading to a diagnosis of hypersensitivity pneumonitis. She was started on prednisone , initially at 40 mg for two weeks, now tapering to 30 mg, with plans to continue tapering to 20 mg and then 10 mg over the next several weeks.  Despite the steroid treatment, there is no improvement in her symptoms, including her cough and breathing. She describes feeling 'jittery' and 'shaking' due to the steroids. She associates her cough with the use of a saline nebulizer, which she uses to help clear her lungs.  A fungal culture from the bronchoscopy returned positive for penicillium. Her daughter has a history of leukemia and underwent a bone marrow transplant in April, which has made her more aware of immune system issues. She wears a bracelet daily in support of her daughter.  She has not received her flu or COVID vaccines this fall. She is currently on 30 mg of prednisone , tapering to 20 mg in two days.       Past Medical History:  Diagnosis Date   Arthritis    hips, lower back   Basal cell carcinoma    Breast lump 10/21/2011   Overview:  Negative  diagnostic mammogram 09/2011    Depression    Fracture of rib of right side 08/17/2013   Hypercholesterolemia    Hypothyroidism    Low blood pressure 10/25/2014   Panic disorder    previous agoraphobia   Seizures (HCC)    febrile - as infant   Shoulder pain, left    bursa   Squamous cell carcinoma of skin    Squamous cell carcinoma of skin 08/07/2022   L lateral lower leg, [facility] sched 09/04/22 at 11:45 AM   Vertigo    positional - rare   Vitamin D  deficiency     Past Surgical History:  Procedure Laterality Date   ABDOMINAL HYSTERECTOMY  1981   partial - secondary to bleeding, ovaries not removed -  vaginal   BRONCHIAL BIOPSY  02/10/2024   Procedure: BRONCHOSCOPY, WITH BIOPSY;  Surgeon: Cave Dorethia, MD;  Location: [facility];  Service: Endoscopy;;   BRONCHIAL WASHINGS  02/10/2024   Procedure: IRRIGATION, BRONCHUS;  Surgeon: Cave Dorethia, MD;  Location: [facility];  Service: Endoscopy;;   CATARACT EXTRACTION W/ INTRAOCULAR LENS  IMPLANT, BILATERAL     COLONOSCOPY WITH PROPOFOL  N/A 05/05/2015   Procedure: COLONOSCOPY WITH PROPOFOL ;  Surgeon: Rogelia Copping, MD;  Location: [facility];  Service: Endoscopy;  Laterality: N/A;   COLONOSCOPY WITH PROPOFOL  N/A 06/26/2021   Procedure: COLONOSCOPY WITH PROPOFOL ;  Surgeon: Therisa Bi, MD;  Location: [facility];  Service: Gastroenterology;  Laterality: N/A;   JOINT REPLACEMENT  09/28/2020   MASS EXCISION Right 12/24/2017   Procedure: EXCISION MASS (  SHIN );  Surgeon: Jama Cordella MATSU, MD;  Location: [facility];  Service: Vascular;  Laterality: Right;   ORIF FIBULA FRACTURE Right 2003   right lower extremitiy   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   shoulder Left    bone suprs   SHOULDER SURGERY  12/2015   TONSILLECTOMY  1974   TOTAL HIP ARTHROPLASTY Right 03/2023   VIDEO BRONCHOSCOPY N/A 02/10/2024   Procedure: BRONCHOSCOPY, WITH FLUOROSCOPY;  Surgeon: Geronimo Amel, MD;  Location: [facility];  Service:  Endoscopy;  Laterality: N/A;    Family History  Problem Relation Age of Onset   Arthritis Mother    Hyperlipidemia Mother    Cancer Mother    Vision loss Mother    Arthritis Father    Prostate cancer Father    Cancer Father    Vision loss Father    Breast cancer Sister 53   Heart disease Brother        heart attack   Diabetes Brother    Cancer Brother    Breast cancer Maternal Aunt 46   Colon cancer Maternal Uncle    Leukemia Daughter        s/p stem cell transplant   Cancer Sister       Social History   Socioeconomic History   Marital status: Married    Spouse name: Not on file   Number of children: Not on file   Years of education: Not on file   Highest education level: Bachelor's degree (e.g., BA, AB, BS)  Occupational History   Not on file  Tobacco Use   Smoking status: Former    Types: Cigarettes   Smokeless tobacco: Former    Quit date: 07/26/2013   Tobacco comments:       Vaping Use   Vaping status: Never Used  Substance and Sexual Activity   Alcohol use: No    Alcohol/week: 0.0 standard drinks of alcohol   Drug use: No   Sexual activity: Yes    Birth control/protection: Post-menopausal  Other Topics Concern   Not on file  Social History Narrative   Former Engineer, 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; 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   2 daughters   Strong faith      Social Drivers of Corporate investment banker Strain: Low Risk  (12/15/2023)   Overall Financial Resource Strain (CARDIA)    Difficulty of Paying Living Expenses: Not very hard  Food Insecurity: Food Insecurity Present (12/15/2023)   Hunger Vital Sign    Worried About Running Out of Food in the Last Year: Never true    Ran Out of Food in the Last Year: Sometimes true  Transportation Needs: No Transportation Needs (12/15/2023)   PRAPARE - Administrator, Civil Service (Medical): No    Lack of Transportation (Non-Medical): No  Physical Activity: Insufficiently Active (12/15/2023)   Exercise Vital Sign    Days  of Exercise per Week: 2 days    Minutes of Exercise per Session: 20 min  Stress: Stress Concern Present (12/15/2023)   [employer] of Occupational Health - Occupational Stress Questionnaire    Feeling of Stress: To some extent  Social Connections: Socially Integrated (12/15/2023)   Social Connection and Isolation Panel    Frequency of Communication with Friends and Family: More than three times a week    Frequency of Social Gatherings with Friends and Family: Patient declined    Attends Religious Services: More than 4 times per year    Active Member of  Clubs or Organizations: Yes    Attends Engineer, structural: More than 4 times per year    Marital Status: Married    Allergies  Allergen Reactions   Duloxetine Other (See Comments)    duloxetine   Duloxetine Hcl Other (See Comments)    depression worsened   Ezetimibe-Simvastatin  Other (See Comments)    Joint pain   Hydroxyzine  Other (See Comments)    Caused pt to be dizzy.     Current Outpatient Medications:    busPIRone  (BUSPAR ) 7.5 MG tablet, Take 1 tablet (7.5 mg total) by mouth 3 (three) times daily., Disp: 180 tablet, Rfl: 2   clonazePAM  (KLONOPIN ) 1 MG tablet, Take 1 tablet (1 mg total) by mouth 2 (two) times daily., Disp: 60 tablet, Rfl: 2   levothyroxine  (SYNTHROID ) 125 MCG tablet, Take 1 tablet (125 mcg total) by mouth daily before breakfast., Disp: 90 tablet, Rfl: 3   Multiple Vitamins-Minerals (PRESERVISION AREDS 2 PO), Take 1 capsule by mouth 2 (two) times daily. , Disp: , Rfl:    Omega 3 1000 MG CAPS, Take by mouth daily., Disp: , Rfl:    predniSONE  (DELTASONE ) 10 MG tablet, 4 daily x 2 weeks, 3 daily x 2 weeks, 2 daily x 2 weeks, 1 daily x 2 weeks,1/2 daily x 2 weeks, 1/2 M, W, F x 2 weeks, then stop, Disp: 150 tablet, Rfl: 0   sertraline  (ZOLOFT ) 100 MG tablet, Take 2 tablets (200 mg total) by mouth daily., Disp: 180 tablet, Rfl: 1   simvastatin  (ZOCOR ) 20 MG tablet, Take 1 tablet (20 mg total) by mouth  every evening., Disp: 90 tablet, Rfl: 1   sodium chloride  HYPERTONIC 3 % nebulizer solution, Take by nebulization as needed for other. Diagnosis code:  R05.3, R91.8, Disp: 750 mL, Rfl: 3   Review of Systems  Constitutional:  Negative for activity change, appetite change, chills, diaphoresis, fatigue, fever and unexpected weight change.  HENT:  Negative for congestion, rhinorrhea, sinus pressure, sneezing, sore throat and trouble swallowing.   Eyes:  Negative for photophobia and visual disturbance.  Respiratory:  Positive for cough. Negative for chest tightness, shortness of breath, wheezing and stridor.   Cardiovascular:  Negative for chest pain, palpitations and leg swelling.  Gastrointestinal:  Negative for abdominal distention, abdominal pain, anal bleeding, blood in stool, constipation, diarrhea, nausea and vomiting.  Genitourinary:  Negative for difficulty urinating, dysuria, flank pain and hematuria.  Musculoskeletal:  Negative for arthralgias, back pain, gait problem, joint swelling and myalgias.  Skin:  Negative for color change, pallor, rash and wound.  Neurological:  Negative for dizziness, tremors, weakness and light-headedness.  Hematological:  Negative for adenopathy. Does not bruise/bleed easily.  Psychiatric/Behavioral:  Negative for agitation, behavioral problems, confusion, decreased concentration, dysphoric mood and sleep disturbance. The patient is nervous/anxious.        Objective:   Physical Exam Constitutional:      General: She is not in acute distress.    Appearance: Normal appearance. She is well-developed. She is not ill-appearing or diaphoretic.  HENT:     Head: Normocephalic and atraumatic.     Right Ear: Hearing and external ear normal.     Left Ear: Hearing and external ear normal.     Nose: No nasal deformity or rhinorrhea.  Eyes:     General: No scleral icterus.    Conjunctiva/sclera: Conjunctivae normal.     Right eye: Right conjunctiva is not  injected.     Left eye: Left conjunctiva is not  injected.     Pupils: Pupils are equal, round, and reactive to light.  Neck:     Vascular: No JVD.  Cardiovascular:     Rate and Rhythm: Normal rate and regular rhythm.     Heart sounds: S1 normal and S2 normal.  Pulmonary:     Effort: Prolonged expiration present.     Breath sounds: No stridor. No rales.  Abdominal:     General: There is no distension.     Palpations: Abdomen is soft.     Tenderness: There is no abdominal tenderness.  Musculoskeletal:        General: Normal range of motion.     Right shoulder: Normal.     Left shoulder: Normal.     Cervical back: Normal range of motion and neck supple.     Right hip: Normal.     Left hip: Normal.     Right knee: Normal.     Left knee: Normal.  Lymphadenopathy:     Head:     Right side of head: No submandibular, preauricular or posterior auricular adenopathy.     Left side of head: No submandibular, preauricular or posterior auricular adenopathy.     Cervical: No cervical adenopathy.     Right cervical: No superficial or deep cervical adenopathy.    Left cervical: No superficial or deep cervical adenopathy.  Skin:    General: Skin is warm and dry.     Coloration: Skin is not pale.     Findings: No abrasion, bruising, ecchymosis, erythema, lesion or rash.     Nails: There is no clubbing.  Neurological:     Mental Status: She is alert and oriented to person, place, and time.     Sensory: No sensory deficit.     Coordination: Coordination normal.     Gait: Gait normal.  Psychiatric:        Attention and Perception: She is attentive.        Speech: Speech normal.        Behavior: Behavior normal. Behavior is cooperative.        Thought Content: Thought content normal.        Judgment: Judgment normal.           Assessment & Plan:   Assessment and Plan    Hypersensitivity pneumonitis with chronic cough and interstitial lung  disease                  Penicillium colonization of lung Fungal culture showed penicillium, likely colonization or lab contamination. Not pathogenic in non-HIGHLY immunocompromised individuals. No treatment indicated.   Chronic cough with interstitial infiltrates and nodules. Biopsy showed granulomatous pathology, suggestive of hypersensitivity pneumonitis. No improvement with prednisone . Differential includes sarcoidosis and infections like mycobacteria, dimorphic fungi such, cryptococcus, histoplasma, and blastomyces, though not seen on special stains or cultures. Discussed potential worsening with steroids if infectious. - Continue prednisone  taper as prescribed by Dr. Geronimo. - I will send a urine histo, blaseto and serum crypto ag --I tried to order an HIV test for thoroughness but the ridiculous EPIC Medicare interface is blocking me from doing this because she is too old to have HIV --when last I checked age did not cure HIV. - Consider sending pathology specimen to [facility]  [place] for PCR testing bacteria, fungi, NTM  if further investigation is desired from an ID standpoint  General Health Maintenance Discussed flu and COVID vaccinations. On prednisone , which may suppress immune system. Vaccination timing decision  left to her discretion. - Consider flu and COVID vaccinations at pharmacy if desired.      I have personally spent 60 minutes involved in face-to-face and non-face-to-face activities for this patient on the day of the visit. Professional time spent includes the following activities: Preparing to see the patient (review of tests), Obtaining and/or reviewing separately obtained history (admission/discharge record), Performing a medically appropriate examination and/or evaluation , Ordering medications/tests/procedures, referring and communicating with other health care professionals, Documenting clinical information in the EMR, Independently  interpreting results (not separately reported), Communicating results to the patient/family/caregiver, Counseling and educating the patient/family/caregiver and Care coordination (not separately reported).

## 2024-03-22 ENCOUNTER — Ambulatory Visit: Admitting: Infectious Disease

## 2024-03-22 ENCOUNTER — Other Ambulatory Visit: Payer: Self-pay

## 2024-03-22 ENCOUNTER — Encounter: Payer: Self-pay | Admitting: Infectious Disease

## 2024-03-22 VITALS — BP 97/67 | HR 80 | Temp 97.5°F | Ht 63.0 in | Wt 122.0 lb

## 2024-03-22 DIAGNOSIS — J679 Hypersensitivity pneumonitis due to unspecified organic dust: Secondary | ICD-10-CM | POA: Diagnosis not present

## 2024-03-22 DIAGNOSIS — R918 Other nonspecific abnormal finding of lung field: Secondary | ICD-10-CM

## 2024-03-22 DIAGNOSIS — Z87891 Personal history of nicotine dependence: Secondary | ICD-10-CM | POA: Diagnosis not present

## 2024-03-22 DIAGNOSIS — Z77120 Contact with and (suspected) exposure to mold (toxic): Secondary | ICD-10-CM | POA: Diagnosis not present

## 2024-03-22 DIAGNOSIS — R053 Chronic cough: Secondary | ICD-10-CM

## 2024-03-22 DIAGNOSIS — J849 Interstitial pulmonary disease, unspecified: Secondary | ICD-10-CM

## 2024-03-22 DIAGNOSIS — J8410 Pulmonary fibrosis, unspecified: Secondary | ICD-10-CM | POA: Diagnosis not present

## 2024-03-22 DIAGNOSIS — Z114 Encounter for screening for human immunodeficiency virus [HIV]: Secondary | ICD-10-CM | POA: Diagnosis not present

## 2024-03-24 ENCOUNTER — Encounter: Payer: Self-pay | Admitting: Family

## 2024-03-24 ENCOUNTER — Ambulatory Visit (INDEPENDENT_AMBULATORY_CARE_PROVIDER_SITE_OTHER): Admitting: Family

## 2024-03-24 VITALS — BP 128/80 | HR 82 | Temp 97.7°F | Ht 63.0 in | Wt 122.6 lb

## 2024-03-24 DIAGNOSIS — M810 Age-related osteoporosis without current pathological fracture: Secondary | ICD-10-CM | POA: Diagnosis not present

## 2024-03-24 DIAGNOSIS — F32A Depression, unspecified: Secondary | ICD-10-CM | POA: Diagnosis not present

## 2024-03-24 DIAGNOSIS — R7309 Other abnormal glucose: Secondary | ICD-10-CM | POA: Diagnosis not present

## 2024-03-24 DIAGNOSIS — F419 Anxiety disorder, unspecified: Secondary | ICD-10-CM | POA: Diagnosis not present

## 2024-03-24 LAB — POCT GLYCOSYLATED HEMOGLOBIN (HGB A1C): Hemoglobin A1C: 5.5 % (ref 4.0–5.6)

## 2024-03-24 NOTE — Assessment & Plan Note (Addendum)
 Concerned based on t score that she would benefit from bisphosphonate such as reclast or anabolic agent in stead of Prolia.She has h/o dysphagia so oral bisphosphonate would be be appropriate.  During our OV , we had discussed Prolia.  I have sent her a mychart message after further review of her DEXA and Osteoporosis guidelines recommending referral to endocrine or to osteoporosis clinic.

## 2024-03-24 NOTE — Progress Notes (Signed)
 Assessment & Plan:  Elevated glucose -     POCT glycosylated hemoglobin (Hb A1C)  Age related osteoporosis, unspecified pathological fracture presence Assessment & Plan: Concerned based on t score that she would benefit from bisphosphonate such as reclast or anabolic agent in stead of Prolia.She has h/o dysphagia so oral bisphosphonate would be be appropriate.  During our OV , we had discussed Prolia.  I have sent her a mychart message after further review of her DEXA and Osteoporosis guidelines recommending referral to endocrine or to osteoporosis clinic.    Anxiety and depression Assessment & Plan: Chronic, suboptimal control due to prednisone  side effects. She is transitioning from 30mg  prednisone  to 20mg .  She politely declines changes to regimen while on prednisone . Continue Klonopin  1 mg twice daily, BuSpar  3 times daily as well as Zoloft  200 mg daily       Return precautions given.   Risks, benefits, and alternatives of the medications and treatment plan prescribed today were discussed, and patient expressed understanding.   Education regarding symptom management and diagnosis given to patient on AVS either electronically or printed.  No follow-ups on file.  Rollene Northern, FNP  Subjective:    Patient ID: Jenkins Paola Rigg, female    DOB: 1945/11/07, 78 y.o.   MRN: [id number]  CC: Prezley Qadir is a 78 y.o. female who presents today for follow up.   HPI: HPI Discussed the use of AI scribe software for clinical note transcription with the patient, who gave verbal consent to proceed.  History of Present Illness   Joory Gough is a 78 year old female with chronic cough and interstitial lung disease who presents with ongoing symptoms despite treatment.  She experiences jitteriness and irritability, particularly between 5 and 7 PM, which she attributes to her prednisone  taper.   She has a chronic cough and has been evaluated by infectious disease  specialist. CT scan in July showed nodularity and granulomas, and a biopsy in August revealed acute and chronic inflammation with granulomas, but was negative for tuberculosis. She is currently on a three-month prednisone  taper but reports no improvement in symptoms.  Denies CP, shortness of breath. She is able to climb stairs without difficulty. Previous pulmonary function tests have shown no change, and she is scheduled for another test later this month.  Her current medications include prednisone , Klonopin , Buspar , and Zoloft . She takes Klonopin  twice a day and Buspar  three times a day.  She has a history of osteoporosis diagnosed in April. She has difficulty swallowing large pills, which she manages by taking them with banana.   She reports losing her hearing aids and plans to get a hearing test at [facility]. She has been using Debrox for ear wax and would like for me to look in her ear to ensure no impaction.      She is compliant with vit D and calcium. No upcoming dental procedures.    Compliant with prednisone  30mg  every day and plans to decrease this week to 20mg .    Recent POC glucose 144 H/o prediabetes Consult with Infectious disease for mold exposure, Penicillium colonization of lung, chronic cough with interstitial infiltrates, d/d sarcoidosis, infection Continued on prednisone  as prescribed by Dr Geronimo  DEXA 09/2023 left forearm T-score: -4.6 , osteoporosis    Allergies: Duloxetine, Duloxetine hcl, Ezetimibe-simvastatin , and Hydroxyzine  Current Outpatient Medications on File Prior to Visit  Medication Sig Dispense Refill   busPIRone  (BUSPAR ) 7.5 MG tablet Take 1 tablet (7.5 mg total) by mouth  3 (three) times daily. 180 tablet 2   clonazePAM  (KLONOPIN ) 1 MG tablet Take 1 tablet (1 mg total) by mouth 2 (two) times daily. 60 tablet 2   levothyroxine  (SYNTHROID ) 125 MCG tablet Take 1 tablet (125 mcg total) by mouth daily before breakfast. 90 tablet 3   Multiple  Vitamins-Minerals (PRESERVISION AREDS 2 PO) Take 1 capsule by mouth 2 (two) times daily.      Omega 3 1000 MG CAPS Take by mouth daily.     predniSONE  (DELTASONE ) 10 MG tablet 4 daily x 2 weeks, 3 daily x 2 weeks, 2 daily x 2 weeks, 1 daily x 2 weeks,1/2 daily x 2 weeks, 1/2 M, W, F x 2 weeks, then stop 150 tablet 0   sertraline  (ZOLOFT ) 100 MG tablet Take 2 tablets (200 mg total) by mouth daily. 180 tablet 1   simvastatin  (ZOCOR ) 20 MG tablet Take 1 tablet (20 mg total) by mouth every evening. 90 tablet 1   sodium chloride  HYPERTONIC 3 % nebulizer solution Take by nebulization as needed for other. Diagnosis code:  R05.3, R91.8 750 mL 3   No current facility-administered medications on file prior to visit.    Review of Systems  Constitutional:  Negative for chills and fever.  Respiratory:  Negative for cough.   Cardiovascular:  Negative for chest pain and palpitations.  Gastrointestinal:  Negative for nausea and vomiting.      Objective:    BP 128/80   Pulse 82   Temp 97.7 F (36.5 C) (Oral)   Ht 5' 3 (1.6 m)   Wt 122 lb 9.6 oz (55.6 kg)   SpO2 98%   BMI 21.72 kg/m  BP Readings from Last 3 Encounters:  03/24/24 128/80  03/22/24 97/67  03/03/24 130/82   Wt Readings from Last 3 Encounters:  03/24/24 122 lb 9.6 oz (55.6 kg)  03/22/24 122 lb (55.3 kg)  03/03/24 125 lb 6.4 oz (56.9 kg)    Physical Exam Vitals reviewed.  Constitutional:      Appearance: She is well-developed.  HENT:     Right Ear: Ear canal and external ear normal.     Left Ear: There is impacted cerumen.  Eyes:     Conjunctiva/sclera: Conjunctivae normal.  Cardiovascular:     Rate and Rhythm: Normal rate and regular rhythm.     Pulses: Normal pulses.     Heart sounds: Normal heart sounds.  Pulmonary:     Effort: Pulmonary effort is normal.     Breath sounds: Normal breath sounds. No wheezing, rhonchi or rales.  Skin:    General: Skin is warm and dry.  Neurological:     Mental Status: She is  alert.  Psychiatric:        Speech: Speech normal.        Behavior: Behavior normal.        Thought Content: Thought content normal.

## 2024-03-24 NOTE — Patient Instructions (Addendum)
 Pleasure seeing you today.

## 2024-03-24 NOTE — Assessment & Plan Note (Signed)
 Chronic, suboptimal control due to prednisone  side effects. She is transitioning from 30mg  prednisone  to 20mg .  She politely declines changes to regimen while on prednisone . Continue Klonopin  1 mg twice daily, BuSpar  3 times daily as well as Zoloft  200 mg daily

## 2024-03-25 ENCOUNTER — Other Ambulatory Visit: Payer: Self-pay | Admitting: Family

## 2024-03-25 ENCOUNTER — Ambulatory Visit: Admitting: Family

## 2024-03-25 DIAGNOSIS — M810 Age-related osteoporosis without current pathological fracture: Secondary | ICD-10-CM

## 2024-03-25 LAB — CRYPTOCOCCAL AG, LTX SCR RFLX TITER
Cryptococcal Ag Screen: NOT DETECTED
MICRO NUMBER:: 17032187
SPECIMEN QUALITY:: ADEQUATE

## 2024-03-25 LAB — MVISTA BLASTOMYCES QNT AG, URINE
Interpretation: NEGATIVE
Result (ng/ml): NOT DETECTED ng/mL

## 2024-03-25 LAB — HISTOPLASMA ANTIGEN, URINE: Histoplasma Antigen, urine: <0.2 ng/mL

## 2024-03-27 LAB — ACID FAST CULTURE WITH REFLEXED SENSITIVITIES (MYCOBACTERIA): Acid Fast Culture: NEGATIVE

## 2024-03-29 ENCOUNTER — Telehealth: Payer: Self-pay

## 2024-03-29 ENCOUNTER — Ambulatory Visit

## 2024-03-29 DIAGNOSIS — H6122 Impacted cerumen, left ear: Secondary | ICD-10-CM

## 2024-03-29 NOTE — Progress Notes (Signed)
 Patient is in office today for a nurse visit for Ear Lavage. Patient ear Irrigation was preformed on left ear.  Per provider orders. Pt did not voice any concerns or pain, she however wanted her PCP to take a look at her right ear.  PCP checked out the ear and discharged pt.

## 2024-03-29 NOTE — Telephone Encounter (Signed)
 error

## 2024-04-04 NOTE — Patient Instructions (Signed)
 ICD-10-CM   1. Hypersensitivity pneumonitis due to organic dust (HCC)  J67.9 Pulmonary function test    2. ILD (interstitial lung disease) (HCC)  J84.9 Pulmonary function test    3. Quit smoking  Z87.891          There is definitely upper lobe bilateral interstitial lung abnormalities.   Biopsy 02/10/24 shows acute and chronic inflammtion with granuloma (TB test negative).. Concer is for sub-acute hypersensitivity pneumonitis due to gardening.   - cough has resolved as of 04/05/2024 with steroids and nebulizer - slowly tapering prednisone   - glad you quit gardening  - glad you are setting HVAC to improe air quality at home    Plan - Avoid gardening or mold exposure - COmplete prednisone  remaining as follows from 04/06/24 -  10 mg once daily for 2 weeks then 5 mg once daily for 2 weeks and then 5 mg on Monday Wednesday Friday for 2 weeks and stop    - get PCP Pam Rollene MATSU, FNP tp monitor your BP, sugar, bone health  - Continue nebulizer  -do spiro and dlco in 4 months  - Deferred flu vaccine 04/05/2024   Follow-up - let us  know any time if there are side effects with steroids - Return in 4 months; 15 min visit

## 2024-04-04 NOTE — Progress Notes (Unsigned)
 OV 04/24/2023  Subjective:  Patient ID: Pam Salazar, female , DOB: 01-Jul-1945 , age 78 y.o. , MRN: [id number] , ADDRESS: [address] [place] [id number] PCP Maribeth Camellia MATSU, MD Patient Care Team: Maribeth Camellia MATSU, MD as PCP - General (Family Medicine) Dellie Louanne MATSU, MD as Consulting Physician (General Surgery) Glendia Shad, MD (Internal Medicine)  This Provider for this visit: Treatment Team:  Attending Provider: Geronimo Amel, MD    04/24/2023 -   Chief Complaint  Patient presents with   Consult    Pt is here for Consult visit.  Pt needs ILD explained to her.     HPI Pam Salazar 78 y.o. -retired Engineer, civil (consulting).  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  She is a former smoker.  She says she started smoking while she was in [place] at [facility] and then quit 2014 or so.  Review of the lung cancer screening notes indicate that she has a 23 pack smoking history and quit in 2014.  She is here because the low-dose CT scan of the chest done in August 2024 showed some reticular opacities in the lung base and therefore she has been referred here.  She says that she was in a two-story house and she only gets dyspneic when she climbs back up for the second time.'s been stable like this for 10 years.  Some 3 weeks ago she had hip surgery and therefore she is not able to mobilize well but she does not think there is any health status change.  There is no chest pain.  She has had annual low-dose CT scans of the chest and I personally visualized this and this basically reticular opacities in the lung base consistent with linear atelectasis or postinflammatory scarring.  This extends back even in the prior ER 2023.  Interstitial lung disease questionnaire done and she brought it with her - Symptoms: Mild dyspnea when climbing stairs and mild fatigue and mild amount of depression level 3.  Her daughter has been diagnosed with leukemia and she was a  healthy 78 year old  - Past medical history relevant ILD: No autoimmune disease no acid reflux no pulmonary hypertension.  She does have hypothyroidism.  She has had COVID boosters and then had COVID disease in 2024   - Review of systems positive for fatigue and arthralgia.  Also positive for dysphagia for the last several years if she eats rice.  She also has some dry eyes.  On 08/01/2023 she reported that fingers to turn blue occasionally when it is cold.   - Family history for lung disease: Mother had COPD  Personal exposure history started smoking 19 6 7  quit 20 1410 cigarettes/day.  She did smoke marijuana 1 time in 1980.  No cocaine use.   - Home and hobby details: [place] home in a single-family home 16 years house was built in 2008.  Extensive organic antigen history is negative except she does plant flowers and works with mulch and woodchips she does have 3 dogs inside but no birds   - Occupational history: She smoked and she grew up in a tobacco farm.  She worked as a Engineer, civil (consulting).  Detail organic and inorganic antigen exposure history is negative    CT Chest data from date: **  - personally visualized and independently interpreted : August 2024 personally visualized - my findings are: Postinflammatory atelectasis/linear scarring  Narrative & Impression  CLINICAL  DATA:  Former smoker with 23 pack-year history   EXAM: CT CHEST WITHOUT CONTRAST LOW-DOSE FOR LUNG CANCER SCREENING   TECHNIQUE: Multidetector CT imaging of the chest was performed following the standard protocol without IV contrast.   RADIATION DOSE REDUCTION: This exam was performed according to the departmental dose-optimization program which includes automated exposure control, adjustment of the mA and/or kV according to patient size and/or use of iterative reconstruction technique.   COMPARISON:  Chest CT dated January 30, 2022   FINDINGS: Cardiovascular: Normal heart size. No pericardial effusion.  Normal caliber thoracic aorta with moderate calcified plaque. No visible coronary artery calcifications.   Mediastinum/Nodes: Esophagus and thyroid  are unremarkable. No enlarged lymph nodes seen in the chest.   Lungs/Pleura: Central airways are patent. No consolidation, pleural effusion or pneumothorax. Mild lower lung subpleural predominant reticular opacities, unchanged when compared with the prior exam. Stable solid nodule of the right lower lobe measuring 5.2 mm on image 157.   Upper Abdomen: No acute abnormality.   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 No aggressive appearing osseous lesions.   IMPRESSION: 1. Lung-RADS 2S, benign appearance or behavior. Continue annual screening with low-dose chest CT without contrast in 12 months. S modifier for interstitial lung changes. 2. Mild lower lung subpleural predominant reticular opacities, could be chronic sequela of prior infection, although interstitial lung disease could also have this appearance. Correlate with patient's systems and consider pulmonary function tests and/or ILD protocol CT for further evaluation. 3. Aortic Atherosclerosis (ICD10-I70.0).     Electronically Signed   By: Rea Marc M.D.   On: 03/04/2023 16:56    OV 08/01/2023  Subjective:  Patient ID: Pam Salazar, female , DOB: [birth year] , age 16 y.o. , MRN: [id number] , ADDRESS: [address] [place] [id number] PCP Hope Merle, MD Patient Care Team: Hope Merle, MD as PCP - General (Family Medicine) Dellie Louanne MATSU, MD as Consulting Physician (General Surgery) Glendia Shad, MD (Internal Medicine)  This Provider for this visit: Treatment Team:  Attending Provider: Geronimo Amel, MD    08/01/2023 -   Chief Complaint  Patient presents with   Follow-up     HPI Pam Salazar 78 y.o. -returns for follow-up to discuss the test results.   Physically she is doing well.  She is not climbing stairs anymore because of her knee but there is no interim shortness of breath or worsening of symptoms.  No emergency room visits or urgent care visits or changes to medications.  Psychologically she is struggling because her daughter has AML and is awaiting a bone marrow transplant in the next couple of months and after that patient will be the caretaker for the daughter in the house.  We reviewed her interstitial lung abnormality workup.  Her ANA is trace positive ANA is trace positive.  In further questioning she reported possible/likely Raynaud's.  Although otherwise she is feeling well.  There is no sicca symptoms.  There is no kidney issues.  We went over these test results.  Pulmonary function test shows a reduction in DLCO to 57%.  Otherwise has a normal spirometry.  We did walke her 200 feet with a forehead probe today and per CMA did NOT desaturate   OV 01/14/2024  Subjective:  Patient ID: Pam Salazar, female , DOB: 1946/03/17 , age 76 y.o. , MRN: [id number] , ADDRESS: [address] [place] [phone] PCP Hope Merle, MD (Inactive) Patient Care  Team: Hope Merle, MD (Inactive) as PCP - General (Family Medicine) Dellie Louanne MATSU, MD as Consulting Physician (General Surgery) Glendia Shad, MD (Internal Medicine)  This Provider for this visit: Treatment Team:  Attending Provider: Geronimo Amel, MD    01/14/2024 -   Chief Complaint  Patient presents with   Medical Management of Chronic Issues    HRCT done 12/24/23. Cough has been getting worse over the past month- non prod for the most part but will occ see some minimal dark green sputum.      HPI Pam Salazar 78 y.o. -follow-up isolated reduction in DLCO in a former smoker with ANA positivity and history of Raynaud's  Presents for follow-up.  Since her last visit no major new problems.  Overall she is the same.  She did have a biopsy of a skin in  the left infraclavicular area.  Her main symptom is that she continues to have cough.  She is also taking care of her daughter with leukemia.  Review of the labs indicate - CT scan high-resolution shows fine nodularity particular in the upper lobes.  It is bilateral and diffuse.  I agree with the differential diagnosis of hypersensitive pneumonitis and also RB-ILD although she has been quit smoking for more than 11 years.  She admits to significant amount of gardening in her life and exposure to damp soil and mulch and while growing up she was exposed to tobacco farm.  -ANA and ENA are trace positive.  She has a history of Raynaud's      CT Chest data from date: *  - personally visualized and independently interpreted :  Narrative & Impression  CLINICAL DATA:  Interstitial lung disease, persistent cough, former smoker   EXAM: CT CHEST WITHOUT CONTRAST   TECHNIQUE: Multidetector CT imaging of the chest was performed following the standard protocol without intravenous contrast. High resolution imaging of the lungs, as well as inspiratory and expiratory imaging, was performed.   RADIATION DOSE REDUCTION: This exam was performed according to the departmental dose-optimization program which includes automated exposure control, adjustment of the mA and/or kV according to patient size and/or use of iterative reconstruction technique.   COMPARISON:  02/21/2023   FINDINGS: Cardiovascular: Scattered aortic atherosclerosis. Normal heart size. No pericardial effusion.   Mediastinum/Nodes: No enlarged mediastinal, hilar, or axillary lymph nodes. Thyroid  gland, trachea, and esophagus demonstrate no significant findings.   Lungs/Pleura: Very extensive centrilobular nodularity throughout the lungs. Minimal, peripheral ground-glass and irregular interstitial opacity of the lung bases with mild associated dependent varicoid bronchiectasis (series 16, image 254). No significant air  trapping on expiratory phase imaging. No pleural effusion or pneumothorax.   Upper Abdomen: No acute abnormality.   Musculoskeletal: No chest wall abnormality. No acute osseous findings.   IMPRESSION: 1. Very extensive fine centrilobular nodularity throughout the lungs, most commonly seen in smoking-related respiratory bronchiolitis. However given extensive nodularity and relatively remote smoking cessation consider other nonspecific infectious or inflammatory inhalational lung disease such as hypersensitivity pneumonitis. 2. Minimal, peripheral ground-glass and irregular interstitial opacity of the lung bases with mild associated dependent varicoid bronchiectasis, findings suggesting minimal interstitial lung disease, likely smoking-related respiratory bronchiolitis-interstitial lung disease spectrum in this setting but technically characterized as an alternative diagnosis pattern by ATS criteria.   Aortic Atherosclerosis (ICD10-I70.0).     Electronically Signed   By: Marolyn JONETTA Jaksch M.D.   On: 12/31/2023 07:23      OV 02/26/2024  Subjective:  Patient ID: Pam Salazar, female , DOB:  October 18, 1945 , age 60 y.o. , MRN: [id number] , ADDRESS: [address] [place] [phone] PCP Dineen Rollene MATSU, FNP Patient Care Team: Dineen Rollene MATSU, FNP as PCP - General (Family Medicine) Dellie Louanne MATSU, MD as Consulting Physician (General Surgery) Glendia Shad, MD (Internal Medicine)  This Provider for this visit: Treatment Team:  Attending Provider: Geronimo Amel, MD    02/26/2024 -   Chief Complaint  Patient presents with   Medical Management of Chronic Issues    Review bronch. She has occ dry cough.      HPI Pam Salazar 78 y.o. -  #Follow-up chronic cough with upper lobe micronodular changes concerning for smokers ILD [quit smoking] versus HP [has gotten history]  -Status post bronchoscopy on 02/10/2024.  A lot of mucus seen in the airways.  In  addition significant neutrophilic BAL.  However cultures are negative.  Transbronchial biopsy.  Which is to biopsy shows nonnecrotizing granuloma.  At this point in time cough still continues.  I gave her the diagnosis of subacute hypersensitive pneumonitis.  Presumably from gardening exposure.  Again went over her home exposure history.  There is no mold inside the house.  No mildew inside the house.  No down exposure.  No feathers.  Her last coding was in April 2025.  She says she continues to cough.  She finds it very difficult to bring up sputum.  We tried to get a 3% saline but the prescription did not go through.  The CMA has worked on this at this visit.  Past medical -02/12/2024 she also went post bronchoscopy to primary care because of fogginess.  But this is since resolved. - That she did go to the ER on 02/12/2024 with back pain.  She has got an MRI and she is following up with her orthopedic surgeon.  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 LUNG, LEFT UPPER LOBE, BIOPSY:  Nonnecrotizing granuloma.  Negative for malignancy.  See comment.   COMMENT:  There is benign lung tissue with a rare nonnecrotizing granuloma.  Special stains for microorganisms will be performed and reported as an  addendum.  No significant interstitial changes are noted in the small  amount of alveolar tissue present.     BAL 02/10/24  Latest Reference Range & Units 02/10/24 14:30  Monocyte-Macrophage-Serous Fluid 50 - 90 % 10 (L)  Other Cells, Fluid % CORRELATE WITH CYTOLOGY.  Color, Fluid YELLOW  COLORLESS !  Total Nucleated Cell Count, Fluid 0 - 1,000 cu mm 600  Fluid Type-FCT  Bronch Lavag  Lymphs, Fluid % 2  Eos, Fluid % 0  Appearance, Fluid CLEAR  HAZY !  Neutrophil Count, Fluid 0 - 25 % 88 (H)       OV 04/04/2024  Subjective:  Patient ID: Pam Salazar, female , DOB: Mar 12, 1946 , age 75 y.o. , MRN: [id number] , ADDRESS: [address] [place]  [id number] PCP Dineen Rollene MATSU, FNP Patient Care Team: Dineen Rollene MATSU, FNP as PCP - General (Family Medicine) Dellie Louanne MATSU, MD as Consulting Physician (General Surgery) Glendia Shad, MD (Internal Medicine)  This Provider for this visit: Treatment Team:  Attending Provider: Geronimo Amel, MD   #Follow-up chronic cough with upper lobe micronodular changes concerning for smokers ILD [quit smoking] versus HP [has gotten history]  04/04/2024 -  No chief complaint on file.    HPI Pam Salazar 78 y.o. -  CT Chest data from date: ****  - personally visualized and independently interpreted : *** - my findings are: ***   PFT     Latest Ref Rng & Units 12/31/2023   12:56 PM 06/26/2023    1:00 PM  PFT Results  FVC-Pre L 3.35  3.24   FVC-Predicted Pre % 134  129   FVC-Post L  3.35   FVC-Predicted Post %  134   Pre FEV1/FVC % % 76  76   Post FEV1/FCV % %  79   FEV1-Pre L 2.55  2.46   FEV1-Predicted Pre % 137  132   FEV1-Post L  2.64   DLCO uncorrected ml/min/mmHg 10.45  10.25   DLCO UNC% % 58  57   DLCO corrected ml/min/mmHg 10.45  10.25   DLCO COR %Predicted % 58  57   DLVA Predicted % 51  49   TLC L  5.41   TLC % Predicted %  113   RV % Predicted %  93        LAB RESULTS last 96 hours No results found.       has a past medical history of Arthritis, Basal cell carcinoma, Breast lump (10/21/2011), Depression, Fracture of rib of right side (08/17/2013), Hypercholesterolemia, Hypothyroidism, Low blood pressure (10/25/2014), Mold exposure (03/21/2024), Panic disorder, Seizures (HCC), Shoulder pain, left, Squamous cell carcinoma of skin, Squamous cell carcinoma of skin (08/07/2022), Vertigo, and Vitamin D  deficiency.   reports that she has quit smoking. Her smoking use included cigarettes. She quit smokeless tobacco use about 10 years ago.  Past Surgical History:  Procedure Laterality Date   ABDOMINAL HYSTERECTOMY  1981   partial -  secondary to bleeding, ovaries not removed -  vaginal   BRONCHIAL BIOPSY  02/10/2024   Procedure: BRONCHOSCOPY, WITH BIOPSY;  Surgeon: Geronimo Amel, MD;  Location: [facility];  Service: Endoscopy;;   BRONCHIAL WASHINGS  02/10/2024   Procedure: IRRIGATION, BRONCHUS;  Surgeon: Geronimo Amel, MD;  Location: [facility];  Service: Endoscopy;;   CATARACT EXTRACTION W/ INTRAOCULAR LENS  IMPLANT, BILATERAL     COLONOSCOPY WITH PROPOFOL  N/A 05/05/2015   Procedure: COLONOSCOPY WITH PROPOFOL ;  Surgeon: Rogelia Copping, MD;  [facility];  Service: Endoscopy;  Laterality: N/A;   COLONOSCOPY WITH PROPOFOL  N/A 06/26/2021   Procedure: COLONOSCOPY WITH PROPOFOL ;  Surgeon: Therisa Bi, MD;  Location: [facility] ENDOSCOPY;  Service: Gastroenterology;  Laterality: N/A;   JOINT REPLACEMENT  09/28/2020   MASS EXCISION Right 12/24/2017   Procedure: EXCISION MASS ( SHIN );  Surgeon: Jama Cordella MATSU, MD;  Location: [facility] ORS;  Service: Vascular;  Laterality: Right;   ORIF FIBULA FRACTURE Right 2003   right lower extremitiy   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   shoulder Left    bone suprs   SHOULDER SURGERY  12/2015   TONSILLECTOMY  1974   TOTAL HIP ARTHROPLASTY Right 03/2023   VIDEO BRONCHOSCOPY N/A 02/10/2024   Procedure: BRONCHOSCOPY, WITH FLUOROSCOPY;  Surgeon: Geronimo Amel, MD;  Location: [facility];  Service: Endoscopy;  Laterality: N/A;    Allergies  Allergen Reactions   Duloxetine Other (See Comments)    duloxetine   Duloxetine Hcl Other (See Comments)    depression worsened   Ezetimibe-Simvastatin  Other (See Comments)    Joint pain   Hydroxyzine  Other (See Comments)    Caused pt to be dizzy.    Immunization History  Administered Date(s) Administered    sv, Bivalent, Protein Subunit Rsvpref,pf (Abrysvo) 05/29/2022   Fluad Quad(high  Dose 65+) 05/29/2022   INFLUENZA, HIGH DOSE SEASONAL PF 05/19/2018, 02/25/2019, 03/12/2021   Influenza Split 06/28/2013,  05/27/2014, 04/21/2017   Influenza-Unspecified 05/25/2015, 05/24/2017, 03/13/2020, 05/29/2022   Moderna Covid-19 Fall Seasonal Vaccine 52yrs & older 07/09/2022   PFIZER(Purple Top)SARS-COV-2 Vaccination 08/06/2019, 08/31/2019, 03/13/2020, 11/30/2020, 03/12/2021   Pfizer Covid-19 Vaccine Bivalent Booster 54yrs & up 07/09/2021   Pneumococcal Conjugate-13 08/03/2013   Pneumococcal Polysaccharide-23 09/14/2012, 07/11/2015   Respiratory Syncytial Virus Vaccine,Recomb Aduvanted(Arexvy) 05/29/2022   Tdap 02/25/2019   Unspecified SARS-COV-2 Vaccination 03/13/2020   Zoster Recombinant(Shingrix) 07/09/2022, 10/17/2022   Zoster, Live 06/24/2008    Family History  Problem Relation Age of Onset   Arthritis Mother    Hyperlipidemia Mother    Cancer Mother    Vision loss Mother    Arthritis Father    Prostate cancer Father    Cancer Father    Vision loss Father    Breast cancer Sister 90   Heart disease Brother        heart attack   Diabetes Brother    Cancer Brother    Breast cancer Maternal Aunt 85   Colon cancer Maternal Uncle    Leukemia Daughter        s/p stem cell transplant   Cancer Sister      Current Outpatient Medications:    busPIRone  (BUSPAR ) 7.5 MG tablet, Take 1 tablet (7.5 mg total) by mouth 3 (three) times daily., Disp: 180 tablet, Rfl: 2   clonazePAM  (KLONOPIN ) 1 MG tablet, Take 1 tablet (1 mg total) by mouth 2 (two) times daily., Disp: 60 tablet, Rfl: 2   levothyroxine  (SYNTHROID ) 125 MCG tablet, Take 1 tablet (125 mcg total) by mouth daily before breakfast., Disp: 90 tablet, Rfl: 3   Multiple Vitamins-Minerals (PRESERVISION AREDS 2 PO), Take 1 capsule by mouth 2 (two) times daily. , Disp: , Rfl:    Omega 3 1000 MG CAPS, Take by mouth daily., Disp: , Rfl:    predniSONE  (DELTASONE ) 10 MG tablet, 4 daily x 2 weeks, 3 daily x 2 weeks, 2 daily x 2 weeks, 1 daily x 2 weeks,1/2 daily x 2 weeks, 1/2 M, W, F x 2 weeks, then stop, Disp: 150 tablet, Rfl: 0   sertraline  (ZOLOFT )  100 MG tablet, Take 2 tablets (200 mg total) by mouth daily., Disp: 180 tablet, Rfl: 1   simvastatin  (ZOCOR ) 20 MG tablet, Take 1 tablet (20 mg total) by mouth every evening., Disp: 90 tablet, Rfl: 1   sodium chloride  HYPERTONIC 3 % nebulizer solution, Take by nebulization as needed for other. Diagnosis code:  R05.3, R91.8, Disp: 750 mL, Rfl: 3      Objective:   There were no vitals filed for this visit.  Estimated body mass index is 21.72 kg/m as calculated from the following:   Height as of 03/24/24: 5' 3 (1.6 m).   Weight as of 03/24/24: 122 lb 9.6 oz (55.6 kg).  @WEIGHTCHANGE @  There were no vitals filed for this visit.   Physical Exam   General: No distress. *** O2 at rest: *** Cane present: *** Sitting in wheel chair: *** Frail: *** Obese: *** Neuro: Alert and Oriented x 3. GCS 15. Speech normal Psych: Pleasant Resp:  Barrel Chest - ***.  Wheeze - ***, Crackles - ***, No overt respiratory distress CVS: Normal heart sounds. Murmurs - *** Ext: Stigmata of Connective Tissue Disease - *** HEENT: Normal upper airway. PEERL +. 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 Activity involving gardening or landscaping  Y93.H2     4. Chronic cough  R05.3     5. 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   Biopsy 02/10/24 shows acute and chronic inflammtion with granuloma (TB test negative).. Concer is for sub-acute hypersensitivity pneumonitis due to gardening. Culture negative     Plan - Avoid gardening or mold exposure - start steroids  - - Start prednisone  -40 mg once daily for 2 weeks and then 30 mg once daily  for 2 weeks and then 20 mg once daily for 2 weeks and then 10 mg once daily for 2 weeks then 5 mg once daily for 2 weeks and then 5 mg on Monday Wednesday Friday for 2 weeks and stop (Total 12 week course, Dispense 150 of 10mg  tabs)   - get PCP Dineen Rollene MATSU, FNP tp monitor your BP, sugar, bone health  - Start 3% hypertonic saline nebulizer per schedule and protocol to help mucociliary clearance of thick mucus seen on bronchoscopy.  -do spiro and dlco in 4-6  weeks   Follow-up - let us  know any time if there are side effects with steroids - Return in 4-6 weeks 30 min visit to discuss course    FOLLOWUP    No follow-ups on file.    SIGNATURE    Dr. Dorethia Cave, M.D., F.C.C.P,  Pulmonary and Critical Care Medicine Staff Physician, [facility] Director - Interstitial Lung Disease  Program  Pulmonary Fibrosis [facility] [place], [place], [address]  Pager: [phone], If no answer or between  15:00h - 7:00h: call [phone] Telephone: [phone]  12:13 PM 04/04/2024   Moderate Complexity MDM OFFICE  2021 E/M guidelines, first released in 2021, with minor revisions added in 2023 and 2024 Must meet the requirements for 2 out of 3 dimensions to qualify.    Number and complexity of problems addressed Amount and/or complexity of data reviewed Risk of complications and/or morbidity  One or more chronic illness with mild exacerbation, OR progression, OR  side effects of treatment  Two or more stable chronic illnesses  One undiagnosed new problem with uncertain prognosis  One acute illness with systemic symptoms   One Acute complicated injury Must meet the requirements for 1 of 3 of the categories)  Category 1: Tests and documents, historian  Any combination of 3 of the following:  Assessment requiring an independent historian  Review of prior external note(s) from each unique source  Review of  results of each unique test  Ordering of each unique test    Category 2: Interpretation of tests   Independent interpretation of a test performed by another physician/other qualified health care professional (not separately reported)  Category 3: Discuss management/tests  Discussion of management or test interpretation with external physician/other qualified health care professional/appropriate source (not separately reported) Moderate risk of morbidity from additional diagnostic testing or treatment Examples only:  Prescription drug management  Decision regarding minor surgery with identfied patient or procedure risk factors  Decision regarding elective major surgery without identified patient or procedure risk factors  Diagnosis or treatment significantly limited by social determinants of health             HIGh Complexity  OFFICE   2021 E/M guidelines, first released in 2021, with minor revisions added in 2023. Must meet the requirements for 2 out of 3 dimensions to qualify.    Number and complexity of problems addressed Amount and/or complexity of data reviewed Risk of complications and/or morbidity  Severe exacerbation of chronic illness  Acute or chronic illnesses that may pose a threat to life or bodily function, e.g., multiple trauma, acute MI, pulmonary embolus, severe respiratory distress, progressive rheumatoid arthritis, psychiatric illness with potential threat to self or others, peritonitis, acute renal failure, abrupt change in neurological status Must meet the requirements for 2 of 3 of the categories)  Category 1: Tests and documents, historian  Any combination of 3 of the following:  Assessment requiring an independent historian  Review of prior external note(s) from each unique source  Review of results of each unique test  Ordering of each unique test    Category 2: Interpretation of tests    Independent interpretation of a test performed  by another physician/other qualified health care professional (not separately reported)  Category 3: Discuss management/tests  Discussion of management or test interpretation with external physician/other qualified health care professional/appropriate source (not separately reported)  HIGH risk of morbidity from additional diagnostic testing or treatment Examples only:  Drug therapy requiring intensive monitoring for toxicity  Decision for elective major surgery with identified pateint or procedure risk factors  Decision regarding hospitalization or escalation of level of care  Decision for DNR or to de-escalate care   Parenteral controlled  substances            LEGEND - Independent interpretation involves the interpretation of a test for which there is a CPT code, and an interpretation or report is customary. When a review and interpretation of a test is performed and documented by the provider, but not separately reported (billed), then this would represent an independent interpretation. This report does not need to conform to the usual standards of a complete report of the test. This does not include interpretation of tests that do not have formal reports such as a complete blood count with differential and blood cultures. Examples would include reviewing a chest radiograph and documenting in the medical record an interpretation, but not separately reporting (billing) the interpretation of the chest radiograph.   An appropriate source includes professionals who are not health care professionals but may be involved in the management of the patient, such as a Clinical research associate, upper officer, case manager or teacher, and does not include discussion with family or informal caregivers.    - SDOH: SDOH are the conditions in the environments where people are born, live, learn, work, play, worship, and age that affect a wide range of health, functioning, and quality-of-life outcomes and  risks. (e.g., housing, food insecurity, transportation, etc.). SDOH-related Z codes ranging from Z55-Z65 are the ICD-10-CM diagnosis codes used to document SDOH data Z55 - Problems related to education and literacy Z56 - Problems related to employment and unemployment Z57 - Occupational exposure to risk factors Z58 - Problems related to physical environment Z59 - Problems related to housing and economic circumstances [phone] - Problems related to social environment [phone] - Problems related to upbringing [phone] - Other problems related to primary support group, including family circumstances Z6 -  Problems related to certain psychosocial circumstances Z65 - Problems related to other psychosocial circumstances

## 2024-04-05 ENCOUNTER — Ambulatory Visit: Admitting: Internal Medicine

## 2024-04-05 ENCOUNTER — Encounter: Payer: Self-pay | Admitting: Internal Medicine

## 2024-04-05 ENCOUNTER — Ambulatory Visit

## 2024-04-05 VITALS — BP 112/68 | HR 75 | Temp 98.0°F | Ht 62.0 in | Wt 124.4 lb

## 2024-04-05 DIAGNOSIS — Z87891 Personal history of nicotine dependence: Secondary | ICD-10-CM

## 2024-04-05 DIAGNOSIS — Z79899 Other long term (current) drug therapy: Secondary | ICD-10-CM | POA: Diagnosis not present

## 2024-04-05 DIAGNOSIS — Z5181 Encounter for therapeutic drug level monitoring: Secondary | ICD-10-CM

## 2024-04-05 DIAGNOSIS — R053 Chronic cough: Secondary | ICD-10-CM

## 2024-04-05 DIAGNOSIS — J849 Interstitial pulmonary disease, unspecified: Secondary | ICD-10-CM | POA: Diagnosis not present

## 2024-04-05 DIAGNOSIS — Z7952 Long term (current) use of systemic steroids: Secondary | ICD-10-CM

## 2024-04-05 DIAGNOSIS — J679 Hypersensitivity pneumonitis due to unspecified organic dust: Secondary | ICD-10-CM | POA: Diagnosis not present

## 2024-04-05 DIAGNOSIS — J8410 Pulmonary fibrosis, unspecified: Secondary | ICD-10-CM

## 2024-04-05 DIAGNOSIS — Y93H2 Activity, gardening and landscaping: Secondary | ICD-10-CM

## 2024-04-05 LAB — PULMONARY FUNCTION TEST
DL/VA % pred: 50 %
DL/VA: 2.11 ml/min/mmHg/L
DLCO unc % pred: 59 %
DLCO unc: 10.6 ml/min/mmHg
FEF 25-75 Pre: 1.81 L/s
FEF2575-%Pred-Pre: 125 %
FEV1-%Pred-Pre: 135 %
FEV1-Pre: 2.51 L
FEV1FVC-%Pred-Pre: 99 %
FEV6-%Pred-Pre: 141 %
FEV6-Pre: 3.33 L
FEV6FVC-%Pred-Pre: 103 %
FVC-%Pred-Pre: 136 %
FVC-Pre: 3.39 L
Pre FEV1/FVC ratio: 74 %
Pre FEV6/FVC Ratio: 98 %

## 2024-04-05 NOTE — Progress Notes (Signed)
Spiro/DLCO performed today. 

## 2024-04-05 NOTE — Patient Instructions (Signed)
Spiro/DLCO performed today. 

## 2024-04-07 ENCOUNTER — Emergency Department
Admission: EM | Admit: 2024-04-07 | Discharge: 2024-04-07 | Disposition: A | Discharge status: Home / Self Care | Attending: Emergency Medicine | Admitting: Emergency Medicine

## 2024-04-07 ENCOUNTER — Emergency Department

## 2024-04-07 ENCOUNTER — Other Ambulatory Visit: Payer: Self-pay

## 2024-04-07 DIAGNOSIS — E039 Hypothyroidism, unspecified: Secondary | ICD-10-CM | POA: Insufficient documentation

## 2024-04-07 DIAGNOSIS — R079 Chest pain, unspecified: Secondary | ICD-10-CM

## 2024-04-07 DIAGNOSIS — R0789 Other chest pain: Secondary | ICD-10-CM | POA: Insufficient documentation

## 2024-04-07 LAB — BASIC METABOLIC PANEL WITH GFR
Anion gap: 13 (ref 5–15)
BUN: 28 mg/dL — ABNORMAL HIGH (ref 8–23)
CO2: 26 mmol/L (ref 22–32)
Calcium: 9.2 mg/dL (ref 8.9–10.3)
Chloride: 102 mmol/L (ref 98–111)
Creatinine, Ser: 1.01 mg/dL — ABNORMAL HIGH (ref 0.44–1.00)
GFR, Estimated: 57 mL/min — ABNORMAL LOW (ref >60)
Glucose, Bld: 119 mg/dL — ABNORMAL HIGH (ref 70–99)
Potassium: 3.6 mmol/L (ref 3.5–5.1)
Sodium: 141 mmol/L (ref 135–145)

## 2024-04-07 LAB — CBC
HCT: 44.4 % (ref 36.0–46.0)
Hemoglobin: 14.7 g/dL (ref 12.0–15.0)
MCH: 29.6 pg (ref 26.0–34.0)
MCHC: 33.1 g/dL (ref 30.0–36.0)
MCV: 89.3 fL (ref 80.0–100.0)
Platelets: 171 K/uL (ref 150–400)
RBC: 4.97 MIL/uL (ref 3.87–5.11)
RDW: 14.7 % (ref 11.5–15.5)
WBC: 10.1 K/uL (ref 4.0–10.5)
nRBC: 0 % (ref 0.0–0.2)

## 2024-04-07 LAB — TROPONIN I (HIGH SENSITIVITY)
Troponin I (High Sensitivity): 2 ng/L (ref <18)
Troponin I (High Sensitivity): <2 ng/L (ref <18)

## 2024-04-07 MED ORDER — CALCIUM CARBONATE ANTACID 500 MG PO CHEW
1.0000 | CHEWABLE_TABLET | Freq: Once | ORAL | Status: AC
  Administered 2024-04-07: 200 mg via ORAL
  Filled 2024-04-07: qty 1

## 2024-04-07 MED ORDER — FAMOTIDINE 20 MG PO TABS: 20.0000 mg | ORAL_TABLET | Freq: Two times a day (BID) | ORAL | 0 refills | Status: DC

## 2024-04-07 MED ORDER — FAMOTIDINE 20 MG PO TABS
40.0000 mg | ORAL_TABLET | Freq: Once | ORAL | Status: AC
  Administered 2024-04-07: 40 mg via ORAL
  Filled 2024-04-07: qty 2

## 2024-04-07 NOTE — ED Provider Notes (Signed)
 [facility] Provider Note    Event Date/Time   First MD Initiated Contact with Patient 04/07/24 1026     (approximate)   History   Chief Complaint: Chest Pain   HPI  Pam Salazar is a 78 y.o. female who reports being in her usual state of health this morning until about 9:30 AM when she developed central chest pressure, no aggravating or alleviating factors.  Nonradiating.  No shortness of breath diaphoresis or vomiting, no palpitations or dizziness.  Lasted for 20 minutes and then resolved.  She does feel tired.  Denies any recent exertional symptoms.  Not pleuritic.  She does note that she has been taking prednisone  for hypersensitivity pneumonitis for a prolonged course, slowly tapering down, currently on 10 mg daily.  Does not take any antacid medicines.  No vomiting or black or bloody stool.        Past Medical History:  Diagnosis Date   Arthritis    hips, lower back   Basal cell carcinoma    Breast lump 10/21/2011   Overview:  Negative diagnostic mammogram 09/2011    Depression    Fracture of rib of right side 08/17/2013   Hypercholesterolemia    Hypothyroidism    Low blood pressure 10/25/2014   Mold exposure 03/21/2024   Panic disorder    previous agoraphobia   Seizures (HCC)    febrile - as infant   Shoulder pain, left    bursa   Squamous cell carcinoma of skin    Squamous cell carcinoma of skin 08/07/2022   L lateral lower leg, EDC sched 09/04/22 at 11:45 AM   Vertigo    positional - rare   Vitamin D  deficiency     Current Outpatient Rx   Order #: [id number] Class: Print   Order #: [id number] Class: Normal   Order #: [id number] Class: Normal   Order #: [id number] Class: Normal   Order #: [id number] Class: Historical Med   Order #: [id number] Class: Historical Med   Order #: [id number] Class: Normal   Order #: [id number] Class: Normal   Order #: [id number] Class: Normal   Order #: [id number] Class: Normal    Past Surgical History:   Procedure Laterality Date   ABDOMINAL HYSTERECTOMY  1981   partial - secondary to bleeding, ovaries not removed -  vaginal   APPENDECTOMY     BRONCHIAL BIOPSY  02/10/2024   Procedure: BRONCHOSCOPY, WITH BIOPSY;  Surgeon: Geronimo Amel, MD;  Location: [facility];  Service: Endoscopy;;   BRONCHIAL WASHINGS  02/10/2024   Procedure: IRRIGATION, BRONCHUS;  Surgeon: Geronimo Amel, MD;  Location: [facility];  Service: Endoscopy;;   CATARACT EXTRACTION W/ INTRAOCULAR LENS  IMPLANT, BILATERAL     COLONOSCOPY WITH PROPOFOL  N/A 05/05/2015   Procedure: COLONOSCOPY WITH PROPOFOL ;  Surgeon: Rogelia Copping, MD;  Location: [facility];  Service: Endoscopy;  Laterality: N/A;   COLONOSCOPY WITH PROPOFOL  N/A 06/26/2021   Procedure: COLONOSCOPY WITH PROPOFOL ;  Surgeon: Therisa Bi, MD;  Location: [facility] ENDOSCOPY;  Service: Gastroenterology;  Laterality: N/A;   JOINT REPLACEMENT  09/28/2020   MASS EXCISION Right 12/24/2017   Procedure: EXCISION MASS ( SHIN );  Surgeon: Jama Cordella MATSU, MD;  Location: [facility] ORS;  Service: Vascular;  Laterality: Right;   ORIF FIBULA FRACTURE Right 2003   right lower extremitiy   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   shoulder Left    bone suprs   SHOULDER SURGERY  12/2015   TONSILLECTOMY  1974   TOTAL HIP ARTHROPLASTY Right 03/2023   VIDEO  BRONCHOSCOPY N/A 02/10/2024   Procedure: BRONCHOSCOPY, WITH FLUOROSCOPY;  Surgeon: Geronimo Amel, MD;  Location: [facility];  Service: Endoscopy;  Laterality: N/A;    Physical Exam   Triage Vital Signs: ED Triage Vitals  Encounter Vitals Group     BP 04/07/24 1030 104/74     Girls Systolic BP Percentile --      Girls Diastolic BP Percentile --      Boys Systolic BP Percentile --      Boys Diastolic BP Percentile --      Pulse Rate 04/07/24 1030 71     Resp 04/07/24 1030 20     Temp 04/07/24 1033 97.7 F (36.5 C)     Temp Source 04/07/24 1030 Oral     SpO2 04/07/24 1030 (!) 10 %     Weight 04/07/24  1031 123 lb 7.3 oz (56 kg)     Height 04/07/24 1031 5' 2 (1.575 m)     Head Circumference --      Peak Flow --      Pain Score 04/07/24 1029 4     Pain Loc --      Pain Education --      Exclude from Growth Chart --     Most recent vital signs: Vitals:   04/07/24 1230 04/07/24 1300  BP: 100/61 102/62  Pulse: 68 64  Resp: 19 20  Temp:    SpO2: 98% 99%    General: Awake, no distress.  CV:  Good peripheral perfusion.  Regular rate rhythm.  Normal distal pulses Resp:  Normal effort.  Clear lungs bilaterally Abd:  No distention.  Soft nontender Other:  No lower extremity edema.  Moist oral mucosa.   ED Results / Procedures / Treatments   Labs (all labs ordered are listed, but only abnormal results are displayed) Labs Reviewed  BASIC METABOLIC PANEL WITH GFR - Abnormal; Notable for the following components:      Result Value   Glucose, Bld 119 (*)    BUN 28 (*)    Creatinine, Ser 1.01 (*)    GFR, Estimated 57 (*)    All other components within normal limits  CBC  TROPONIN I (HIGH SENSITIVITY)  TROPONIN I (HIGH SENSITIVITY)     EKG Interpreted by me Sinus rhythm, rate 66. Normal axis, intervals, QRS, ST segments, T waves.   RADIOLOGY Interpreted by me, no focal infiltrates.  Radiology report reviewed.   PROCEDURES:  Procedures   MEDICATIONS ORDERED IN ED: Medications  famotidine  (PEPCID ) tablet 40 mg (40 mg Oral Given 04/07/24 1117)  calcium carbonate (TUMS - dosed in mg elemental calcium) chewable tablet 200 mg of elemental calcium (200 mg of elemental calcium Oral Given 04/07/24 1118)     IMPRESSION / MDM / ASSESSMENT AND PLAN / ED COURSE  I reviewed the triage vital signs and the nursing notes.  DDx: Prednisone  induced gastritis, non-STEMI, pneumonia, AKI, electrolyte derangement, anemia  Patient's presentation is most consistent with acute presentation with potential threat to life or bodily function.  Patient presents with atypical chest  discomfort.  She is nontoxic, symptoms of resolved on arrival to the ED.  Will screen with troponins, give antacids.  Doubt PE or dissection.   Clinical Course as of 04/07/24 1351  Wed Apr 07, 2024  1346 Labs are normal.  Patient is asymptomatic, tolerating oral intake, ambulatory.  Stable for discharge.  Recommended referral to cardiology for monitoring of her symptoms, evaluation of her finding of aortic stenosis detected by  past CT scan.  She would like to follow-up with [facility] cardiology. [PS]    Clinical Course User Index [PS] Viviann Pastor, MD     FINAL CLINICAL IMPRESSION(S) / ED DIAGNOSES   Final diagnoses:  Nonspecific chest pain     Rx / DC Orders   ED Discharge Orders          Ordered    famotidine  (PEPCID ) 20 MG tablet  2 times daily        04/07/24 1347             Note:  This document was prepared using Dragon voice recognition software and may include unintentional dictation errors.   Viviann Pastor, MD 04/07/24 1351

## 2024-04-07 NOTE — ED Triage Notes (Signed)
 Pt to ED with husband POV for pressure-type CP since 1 hour ago. Whole chest. Denies nausea, SOB. Had dizziness at first and feels lightheaded and weak since this started. Denies cardiac, pulmonary hx. Skin is dry. EDP at bedside.

## 2024-04-07 NOTE — Discharge Instructions (Addendum)
 Continue taking famotidine  20 mg 2 times a day to protect your stomach until prednisone  is stopped.

## 2024-04-08 DIAGNOSIS — Z23 Encounter for immunization: Secondary | ICD-10-CM | POA: Diagnosis not present

## 2024-04-15 ENCOUNTER — Other Ambulatory Visit: Payer: Self-pay | Admitting: Family

## 2024-04-15 ENCOUNTER — Telehealth: Payer: Self-pay

## 2024-04-15 DIAGNOSIS — R079 Chest pain, unspecified: Secondary | ICD-10-CM

## 2024-04-15 NOTE — Telephone Encounter (Signed)
 Spoke to pt she states that it was not a cardiac event but she does need the referral and will keep appt with Dr Abbey

## 2024-04-15 NOTE — Telephone Encounter (Signed)
 LVM to call back to office to schedule ED f/up to discuss referral to Cardiology, please schedule when pt calls back and document

## 2024-04-15 NOTE — Telephone Encounter (Unsigned)
 Copied from CRM #[id number]. Topic: General - Other >> Apr 15, 2024 11:28 AM Pam Salazar wrote: Reason for CRM: patient returned the call and I read the message to her and she stated she did not want to come in for an appointment. Patient stated she has not had anymore symptoms and it was not cardiac involved. Patient stated she is overwhelmed with too many appointments and she already put the request in for the referral and they are working on it  [phone]

## 2024-04-15 NOTE — Telephone Encounter (Signed)
 Copied from CRM [id number]. Topic: Referral - Question >> Apr 15, 2024 10:28 AM Pinkey ORN wrote: Reason for CRM: Referral Request >> Apr 15, 2024 10:29 AM Pinkey ORN wrote: Patient states she was in the ED 04/07/24 and was advised to reached out to her primary doctor to get a referral to a cardiologist. Please follow up with patient. Patient is requesting to be sent to [facility], patient does not want to go to [place].

## 2024-04-20 NOTE — Telephone Encounter (Signed)
 Noted Ref to cardiology placed

## 2024-04-20 NOTE — Addendum Note (Signed)
 Addended by: DINEEN ROLLENE MATSU on: 04/20/2024 06:23 AM   Modules accepted: Orders

## 2024-04-20 NOTE — Telephone Encounter (Signed)
 Pt has been notified.

## 2024-04-27 ENCOUNTER — Ambulatory Visit

## 2024-04-29 DIAGNOSIS — H5712 Ocular pain, left eye: Secondary | ICD-10-CM | POA: Diagnosis not present

## 2024-04-29 DIAGNOSIS — B009 Herpesviral infection, unspecified: Secondary | ICD-10-CM | POA: Diagnosis not present

## 2024-05-04 ENCOUNTER — Ambulatory Visit: Attending: Cardiovascular Disease | Admitting: Cardiovascular Disease

## 2024-05-04 ENCOUNTER — Encounter: Payer: Self-pay | Admitting: Cardiovascular Disease

## 2024-05-04 VITALS — BP 82/62 | HR 82 | Ht 63.0 in | Wt 127.1 lb

## 2024-05-04 DIAGNOSIS — E7800 Pure hypercholesterolemia, unspecified: Secondary | ICD-10-CM | POA: Diagnosis not present

## 2024-05-04 DIAGNOSIS — R079 Chest pain, unspecified: Secondary | ICD-10-CM | POA: Diagnosis not present

## 2024-05-04 DIAGNOSIS — I700 Atherosclerosis of aorta: Secondary | ICD-10-CM | POA: Diagnosis not present

## 2024-05-04 NOTE — Progress Notes (Unsigned)
 Cardiology Office Note   Date:  05/04/2024   ID:  Pam Salazar, Pam Salazar [birth year], MRN [id number]  PCP:  Dineen Rollene MATSU, FNP  Cardiologist:   Deatrice Cage, MD   Chief Complaint  Patient presents with   New Patient (Initial Visit)    ED CP no complaints today. Meds reviewed verbally with pt.      History of Present Illness: Pam Salazar is a 78 y.o. female who was referred from CAD for evaluation of chest pain.  She has no prior cardiac history. 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  She has been relatively healthy throughout her life.  She quit smoking in 2014.  She has history of low blood pressure and mild hyperlipidemia.  No previous cardiac history. She was diagnosed this year with hypersensitivity pneumonitis of the lungs and was treated by Dr. Geronimo.  She was given prednisone  for 3 months and she is still finishing the course.  This gave her a lot of GI side effects. She had CT of the chest this year that was personally reviewed by me.  It showed mild aortic calcifications but no coronary calcifications. She has strong family history of cancer but no premature coronary artery disease.  In October, she had an episode of substernal chest pain and tightness that lasted for about 15 to 30 minutes.  She went to the emergency room at [facility].  Troponin was normal and EKG showed no acute changes.  She was thought to have GI symptoms related to prednisone .  She was given Pepcid  with subsequent resolution of symptoms.  No recurrent symptoms since then.  She is very active and reports no exertional symptoms whatsoever now.    Past Medical History:  Diagnosis Date   Arthritis    hips, lower back   Basal cell carcinoma    Breast lump 10/21/2011   Overview:  Negative diagnostic mammogram 09/2011    Depression    Fracture of rib of right side 08/17/2013   Hypercholesterolemia    Hypothyroidism    Low blood pressure 10/25/2014   Mold  exposure 03/21/2024   Panic disorder    previous agoraphobia   Seizures (HCC)    febrile - as infant   Shoulder pain, left    bursa   Squamous cell carcinoma of skin    Squamous cell carcinoma of skin 08/07/2022   L lateral lower leg, EDC sched 09/04/22 at 11:45 AM   Vertigo    positional - rare   Vitamin D  deficiency     Past Surgical History:  Procedure Laterality Date   ABDOMINAL HYSTERECTOMY  1981   partial - secondary to bleeding, ovaries not removed -  vaginal   APPENDECTOMY     BRONCHIAL BIOPSY  02/10/2024   Procedure: BRONCHOSCOPY, WITH BIOPSY;  Surgeon: Geronimo Amel, MD;  Location: [facility];  Service: Endoscopy;;   BRONCHIAL WASHINGS  02/10/2024   Procedure: IRRIGATION, BRONCHUS;  Surgeon: Geronimo Amel, MD;  Location: [facility];  Service: Endoscopy;;   CATARACT EXTRACTION W/ INTRAOCULAR LENS  IMPLANT, BILATERAL     COLONOSCOPY WITH PROPOFOL  N/A 05/05/2015   Procedure: COLONOSCOPY WITH PROPOFOL ;  Surgeon: Rogelia Copping, MD;  Location: [facility];  Service: Endoscopy;  Laterality: N/A;   COLONOSCOPY WITH PROPOFOL  N/A 06/26/2021   Procedure: COLONOSCOPY WITH PROPOFOL ;  Surgeon: Therisa Bi, MD;  Location: [facility] ENDOSCOPY;  Service: Gastroenterology;  Laterality: N/A;   JOINT REPLACEMENT  09/28/2020   MASS EXCISION  Right 12/24/2017   Procedure: EXCISION MASS ( SHIN );  Surgeon: Jama Cordella MATSU, MD;  Location: [facility] ORS;  Service: Vascular;  Laterality: Right;   ORIF FIBULA FRACTURE Right 2003   right lower extremitiy   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   shoulder Left    bone suprs   SHOULDER SURGERY  12/2015   TONSILLECTOMY  1974   TOTAL HIP ARTHROPLASTY Right 03/2023   VIDEO BRONCHOSCOPY N/A 02/10/2024   Procedure: BRONCHOSCOPY, WITH FLUOROSCOPY;  Surgeon: Geronimo Amel, MD;  Location: [facility];  Service: Endoscopy;  Laterality: N/A;     Current Outpatient Medications  Medication Sig Dispense Refill   busPIRone  (BUSPAR ) 7.5  MG tablet Take 1 tablet (7.5 mg total) by mouth 3 (three) times daily. 180 tablet 2   clonazePAM  (KLONOPIN ) 1 MG tablet Take 1 tablet (1 mg total) by mouth 2 (two) times daily. 60 tablet 2   famotidine  (PEPCID ) 20 MG tablet Take 1 tablet (20 mg total) by mouth 2 (two) times daily. 60 tablet 0   levothyroxine  (SYNTHROID ) 125 MCG tablet Take 1 tablet (125 mcg total) by mouth daily before breakfast. 90 tablet 3   Multiple Vitamins-Minerals (PRESERVISION AREDS 2 PO) Take 1 capsule by mouth 2 (two) times daily.      Omega 3 1000 MG CAPS Take by mouth daily.     predniSONE  (DELTASONE ) 10 MG tablet 4 daily x 2 weeks, 3 daily x 2 weeks, 2 daily x 2 weeks, 1 daily x 2 weeks,1/2 daily x 2 weeks, 1/2 M, W, F x 2 weeks, then stop 150 tablet 0   sertraline  (ZOLOFT ) 100 MG tablet Take 2 tablets (200 mg total) by mouth daily. 180 tablet 1   simvastatin  (ZOCOR ) 20 MG tablet Take 1 tablet (20 mg total) by mouth every evening. 90 tablet 1   sodium chloride  HYPERTONIC 3 % nebulizer solution Take by nebulization as needed for other. Diagnosis code:  R05.3, R91.8 750 mL 3   valACYclovir  (VALTREX ) 1000 MG tablet Take 1,000 mg by mouth 3 (three) times daily.     No current facility-administered medications for this visit.    Allergies:   Duloxetine, Duloxetine hcl, Ezetimibe-simvastatin , and Hydroxyzine     Social History:  The patient  reports that she has quit smoking. Her smoking use included cigarettes. She quit smokeless tobacco use about 10 years ago. She reports that she does not drink alcohol and does not use drugs.   Family History:  The patient's family history includes Arthritis in her father and mother; Breast cancer (age of onset: 65) in her sister; Breast cancer (age of onset: 59) in her maternal aunt; Cancer in her brother, father, mother, and sister; Colon cancer in her maternal uncle; Diabetes in her brother; Heart attack in her brother; Heart disease in her brother; Hyperlipidemia in her mother;  Leukemia in her daughter; Prostate cancer in her father; Vision loss in her father and mother.    ROS:  Please see the history of present illness.   Otherwise, review of systems are positive for none.   All other systems are reviewed and negative.    PHYSICAL EXAM: VS:  BP (!) 82/62 (BP Location: Right Arm, Cuff Size: Normal)   Pulse 82   Ht 5' 3 (1.6 m)   Wt 127 lb 2 oz (57.7 kg)   SpO2 98%   BMI 22.52 kg/m  , BMI Body mass index is 22.52 kg/m. GEN: Well nourished, well developed, in no acute distress  HEENT:  normal  Neck: no JVD, carotid bruits, or masses Cardiac: RRR; no murmurs, rubs, or gallops,no edema  Respiratory:  clear to auscultation bilaterally, normal work of breathing GI: soft, nontender, nondistended, + BS MS: no deformity or atrophy  Skin: warm and dry, no rash Neuro:  Strength and sensation are intact Psych: euthymic mood, full affect   EKG:  EKG is ordered today. The ekg ordered today demonstrates : Sinus rhythm with short PR Low voltage QRS Nonspecific ST abnormality    Recent Labs: 11/11/2023: ALT 15 01/13/2024: TSH 3.89 04/07/2024: BUN 28; Creatinine, Ser 1.01; Hemoglobin 14.7; Platelets 171; Potassium 3.6; Sodium 141    Lipid Panel    Component Value Date/Time   CHOL 203 (H) 06/12/2022 0850   TRIG 143.0 06/12/2022 0850   HDL 74.60 06/12/2022 0850   CHOLHDL 3 06/12/2022 0850   VLDL 28.6 06/12/2022 0850   LDLCALC 100 (H) 06/12/2022 0850      Wt Readings from Last 3 Encounters:  05/04/24 127 lb 2 oz (57.7 kg)  04/07/24 123 lb 7.3 oz (56 kg)  04/05/24 124 lb 6.4 oz (56.4 kg)          05/04/2024   12:12 PM 04/23/2024    4:23 PM  PAD Screen  Previous PAD dx? No No  Previous surgical procedure? No No  Pain with walking? No No  Feet/toe relief with dangling? No No  Painful, non-healing ulcers? No No  Extremities discolored? No No      ASSESSMENT AND PLAN:  1.  Atypical chest pain likely noncardiac: Her symptoms resolved  completely after she was given Pepcid .  She has not had any recurrent symptoms in the last month and she reports no exertional symptoms at all.  No need for further workup given resolution of symptoms but I asked her to notify us  if she has recurrent chest pain. Her CTA of the chest this year was personally reviewed by me and showed no evidence of coronary artery calcifications.  2.  Aortic atherosclerosis: Noted on CT scan.  This looks mild overall and she does not seem to be having symptoms related to this.  3.  Hyperlipidemia: Currently on simvastatin .    Disposition:   FU with me as needed.  Signed,  Deatrice Cage, MD  05/04/2024 3:04 PM    [facility]

## 2024-05-04 NOTE — Patient Instructions (Signed)
 Medication Instructions:  No changes *If you need a refill on your cardiac medications before your next appointment, please call your pharmacy*  Lab Work: None ordered If you have labs (blood work) drawn today and your tests are completely normal, you will receive your results only by: MyChart Message (if you have MyChart) OR A paper copy in the mail If you have any lab test that is abnormal or we need to change your treatment, we will call you to review the results.  Testing/Procedures: None ordered  Follow-Up: At [facility], you and your health needs are our priority.  As part of our continuing mission to provide you with exceptional heart care, our providers are all part of one team.  This team includes your primary Cardiologist (physician) and Advanced Practice Providers or APPs (Physician Assistants and Nurse Practitioners) who all work together to provide you with the care you need, when you need it.  Your next appointment:   Follow up as needed

## 2024-05-06 ENCOUNTER — Other Ambulatory Visit
Admission: RE | Admit: 2024-05-06 | Discharge: 2024-05-06 | Disposition: A | Discharge status: Home / Self Care | Source: Ambulatory Visit | Attending: Ophthalmology | Admitting: Ophthalmology

## 2024-05-06 DIAGNOSIS — B009 Herpesviral infection, unspecified: Secondary | ICD-10-CM | POA: Diagnosis not present

## 2024-05-06 DIAGNOSIS — M316 Other giant cell arteritis: Secondary | ICD-10-CM | POA: Diagnosis not present

## 2024-05-06 DIAGNOSIS — R519 Headache, unspecified: Secondary | ICD-10-CM | POA: Diagnosis not present

## 2024-05-06 DIAGNOSIS — H5712 Ocular pain, left eye: Secondary | ICD-10-CM | POA: Diagnosis not present

## 2024-05-06 LAB — PLATELET COUNT: Platelets: 200 K/uL (ref 150–400)

## 2024-05-06 LAB — SEDIMENTATION RATE: Sed Rate: 7 mm/h (ref 0–30)

## 2024-05-06 LAB — C-REACTIVE PROTEIN: CRP: 0.5 mg/dL (ref <1.0)

## 2024-05-10 ENCOUNTER — Ambulatory Visit: Payer: Self-pay

## 2024-05-10 NOTE — Telephone Encounter (Signed)
 FYI Only or Action Required?: Action required by provider: reporting fall.  Patient was last seen in primary care on 03/24/2024 by Dineen Rollene MATSU, FNP.  Called Nurse Triage reporting Fall.  Symptoms began today.  Interventions attempted: Nothing.  Symptoms are: unchanged.  Triage Disposition: See HCP Within 4 Hours (Or PCP Triage)  Patient/caregiver understands and will follow disposition?: No   Copied from CRM #[id number]. Topic: Clinical - Red Word Triage >> May 10, 2024 12:44 PM Ashley R wrote: Red Word that prompted transfer to Nurse Triage: Fall 15-20 minutes, hit head and large knot, hip also quite sore Reason for Disposition  Small bruise is present  [1] Age over 64 years AND [2] swelling or bruise  Answer Assessment - Initial Assessment Questions Additional info: Patient was walking her dog when they came across two other dogs, patient got tangled up and fell landing on her back side, no loss of conciousness. She reports she was able to get up on her own, chat with dog owners for sometime and then proceeded to walk two blocks home with her dog without issue. She did notice some left gluteal tenderness on her walk home, has small swelling to posterior head.   1. MECHANISM: How did the fall happen?     Walking dog  2. DOMESTIC VIOLENCE AND ELDER ABUSE SCREENING: Did you fall because someone pushed you or tried to hurt you? If Yes, ask: Are you safe now?     Denies  3. ONSET: When did the fall happen? (e.g., minutes, hours, or days ago)     20 minutes ago 4. LOCATION: What part of the body hit the ground? (e.g., back, buttocks, head, hips, knees, hands, head, stomach)     backward 5. INJURY: Did you hurt (injure) yourself when you fell? If Yes, ask: What did you injure? Tell me more about this? (e.g., body area; type of injury; pain severity)     Goose egg on head, left hip  6. PAIN: Is there any pain? If Yes, ask: How bad is the pain? (e.g., Scale 0-10;  or none, mild,      Able to walk two blocks home. Tender hip 7. SIZE: For cuts, bruises, or swelling, ask: How large is it? (e.g., inches or centimeters)      Goose egg  8. PREGNANCY: Is there any chance you are pregnant? When was your last menstrual period?      9. OTHER SYMPTOMS: Do you have any other symptoms? (e.g., dizziness, fever, weakness; new-onset or worsening).      Denies  10. CAUSE: What do you think caused the fall (or falling)? (e.g., dizzy spell, tripped)       Walking dog  Answer Assessment - Initial Assessment Questions Additional info: Patient is does not want evaluation, asking for advise and symptoms to monitor. She states she will call back if she develops any symptoms or goose egg worsens.    1. MECHANISM: How did the injury happen? For falls, ask: What height did you fall from? and What surface did you fall against?      Walking dog, tripped  2. ONSET: When did the injury happen? (e.g., minutes, hours ago)      About 20 minutes ago  3. NEUROLOGIC SYMPTOMS: Was there any loss of consciousness? Are there any other neurological symptoms?      Denies  4. MENTAL STATUS: Does the person know who they are, who you are, and where they are?  Alert & oriented  5. LOCATION: What part of the head was hit?      Posterior  6. SCALP APPEARANCE: What does the scalp look like? Is it bleeding now? If Yes, ask: Is it difficult to stop?      Goose egg-no bleeding  7. SIZE: For cuts, bruises, or swelling, ask: How large is it? (e.g., inches or centimeters)      Small lump 8. PAIN: Is there any pain? If Yes, ask: How bad is it? (Scale 0-10; or none, mild, moderate, severe)     Mild 9. TETANUS: For any breaks in the skin, ask: When was your last tetanus booster?     Not bleeding  10. BLOOD THINNERS: Do you take any blood thinners? (e.g., aspirin, clopidogrel / Plavix, coumadin, heparin). Notes: Other strong blood thinners include:  Arixtra (fondaparinux), Eliquis (apixaban), Pradaxa (dabigatran), and Xarelto (rivaroxaban).       Denies  11. OTHER SYMPTOMS: Do you have any other symptoms? (e.g., neck pain, vomiting)       Left gluteal tenderness when walking  Protocols used: Falls and Falling-A-AH, Head Injury-A-AH

## 2024-05-11 NOTE — Telephone Encounter (Signed)
 Spoke to pt she states that she is doing fine now no swelling no other sxs but she will call back before her appt if she needs to she will keep appt with Dr Abbey on 05/26/24

## 2024-05-12 ENCOUNTER — Other Ambulatory Visit: Payer: Self-pay

## 2024-05-12 DIAGNOSIS — E7800 Pure hypercholesterolemia, unspecified: Secondary | ICD-10-CM

## 2024-05-13 MED ORDER — SIMVASTATIN 20 MG PO TABS: 20.0000 mg | ORAL_TABLET | Freq: Every evening | ORAL | 1 refills | Status: DC

## 2024-05-14 ENCOUNTER — Encounter

## 2024-05-26 ENCOUNTER — Ambulatory Visit

## 2024-05-26 VITALS — BP 90/68 | HR 82 | Temp 98.4°F | Ht 63.0 in | Wt 125.2 lb

## 2024-05-26 DIAGNOSIS — E7800 Pure hypercholesterolemia, unspecified: Secondary | ICD-10-CM | POA: Diagnosis not present

## 2024-05-26 DIAGNOSIS — J849 Interstitial pulmonary disease, unspecified: Secondary | ICD-10-CM

## 2024-05-26 DIAGNOSIS — F3341 Major depressive disorder, recurrent, in partial remission: Secondary | ICD-10-CM

## 2024-05-26 DIAGNOSIS — M62830 Muscle spasm of back: Secondary | ICD-10-CM | POA: Insufficient documentation

## 2024-05-26 DIAGNOSIS — E039 Hypothyroidism, unspecified: Secondary | ICD-10-CM

## 2024-05-26 DIAGNOSIS — Z79899 Other long term (current) drug therapy: Secondary | ICD-10-CM

## 2024-05-26 DIAGNOSIS — R42 Dizziness and giddiness: Secondary | ICD-10-CM

## 2024-05-26 DIAGNOSIS — F39 Unspecified mood [affective] disorder: Secondary | ICD-10-CM

## 2024-05-26 DIAGNOSIS — F32A Depression, unspecified: Secondary | ICD-10-CM

## 2024-05-26 DIAGNOSIS — W19XXXA Unspecified fall, initial encounter: Secondary | ICD-10-CM | POA: Insufficient documentation

## 2024-05-26 DIAGNOSIS — M810 Age-related osteoporosis without current pathological fracture: Secondary | ICD-10-CM

## 2024-05-26 DIAGNOSIS — F411 Generalized anxiety disorder: Secondary | ICD-10-CM | POA: Insufficient documentation

## 2024-05-26 DIAGNOSIS — R2689 Other abnormalities of gait and mobility: Secondary | ICD-10-CM | POA: Diagnosis not present

## 2024-05-26 MED ORDER — SIMVASTATIN 20 MG PO TABS: 20.0000 mg | ORAL_TABLET | Freq: Every evening | ORAL | 3 refills | Status: AC

## 2024-05-26 MED ORDER — CLONAZEPAM 1 MG PO TABS: 1.0000 mg | ORAL_TABLET | Freq: Two times a day (BID) | ORAL | 0 refills | Status: AC

## 2024-05-26 MED ORDER — LEVOTHYROXINE SODIUM 125 MCG PO TABS: 125.0000 ug | ORAL_TABLET | Freq: Every day | ORAL | 3 refills | Status: AC

## 2024-05-26 MED ORDER — SERTRALINE HCL 100 MG PO TABS: 200.0000 mg | ORAL_TABLET | Freq: Every day | ORAL | 3 refills | Status: AC

## 2024-05-26 MED ORDER — BUSPIRONE HCL 7.5 MG PO TABS: 7.5000 mg | ORAL_TABLET | Freq: Three times a day (TID) | ORAL | 3 refills | Status: AC

## 2024-05-26 NOTE — Assessment & Plan Note (Addendum)
 Continue Simvastatin  20 mg at bedtime, tolerating well. Discussed with the patient that since she has been tolerating Simvastatin  there is no proven benefit of adding CoQ10. Patient agrees. Check fasting lipid panel and CMP.  Orders:   simvastatin  (ZOCOR ) 20 MG tablet; Take 1 tablet (20 mg total) by mouth every evening.   Comp Met (CMET); Future   Lipid panel; Future

## 2024-05-26 NOTE — Assessment & Plan Note (Addendum)
 Continue Levothyroxine  125 mcg daily on an empty stomach.  Check TSH. Orders:   levothyroxine  (SYNTHROID ) 125 MCG tablet; Take 1 tablet (125 mcg total) by mouth daily before breakfast.   TSH; Future

## 2024-05-26 NOTE — Assessment & Plan Note (Signed)
 Discussed hydration importance due to low blood pressure history. Ensure daily water  intake of 40-50 ounces. PT/OT discussed physical therapy for strengthening and fall risk reduction. Refer to physical therapy for muscle strengthening exercises.

## 2024-05-26 NOTE — Assessment & Plan Note (Signed)
 Plan per mood disorder.

## 2024-05-26 NOTE — Assessment & Plan Note (Addendum)
 I reviewed her imaging result after patient left today's visit. She is listed to have Pending Prolia approval as of 03/2024. My chart message sent to the patient with results and recommendations to see endocrinologist. Pending patient message and will put in a endocrine referral. Patient with h/o dysphagia, recent treatment with prolonged prednisone  for respiratory concerns so I would not recommend oral bisphosphonate.   Orders:   Vitamin D  (25 hydroxy); Future

## 2024-05-26 NOTE — Assessment & Plan Note (Addendum)
 Plan per mood disorder.

## 2024-05-26 NOTE — Assessment & Plan Note (Signed)
 Positional, ineducable vertigo suggestive of benign paroxysmal positional vertigo. Referral to PT/ OT for vestibular therapy.   Orders:   Ambulatory referral to Physical Therapy   Ambulatory referral to Occupational Therapy

## 2024-05-26 NOTE — Assessment & Plan Note (Addendum)
 Cervical paraspinal muscle spasm, left worse than right. Physical therapy referral made. Avoiding muscle relaxant due to increased risk of fall.   Orders:   Ambulatory referral to Physical Therapy

## 2024-05-26 NOTE — Assessment & Plan Note (Addendum)
 Patient with long history of anxiety, depression, panic disorder managed with Clonazepam  1 mg BID, Sertraline  200 mg daily and Buspar  7.5 mg TID. No SI/HI.  Unsuccessful weaning off clonazepam  due to withdrawal symptoms. Discussed risks of long-term benzodiazepine use including increased risk of fall, dementia, balance concerns. Patient is aware of potential s/e and prefers to stay on current treatment. PDMP reviewed.  Continue current medications.  Orders:   busPIRone  (BUSPAR ) 7.5 MG tablet; Take 1 tablet (7.5 mg total) by mouth 3 (three) times daily.   clonazePAM  (KLONOPIN ) 1 MG tablet; Take 1 tablet (1 mg total) by mouth 2 (two) times daily.   sertraline  (ZOLOFT ) 100 MG tablet; Take 2 tablets (200 mg total) by mouth daily.

## 2024-05-26 NOTE — Patient Instructions (Addendum)
-   I am referring u to physical therapy and occupational therapy for vertigo treatment and to work on balance, neck pain. Please schedule a fasting lab whenever it is convenient for you.

## 2024-05-26 NOTE — Assessment & Plan Note (Signed)
 Plan per balance disorder. Orders:   B12; Future   Ambulatory referral to Physical Therapy

## 2024-05-26 NOTE — Assessment & Plan Note (Addendum)
 Diagnosed with hypersensitivity pneumonitis and finished Prednisone  treatment, following up with pulmonologist Dr. Geronimo.  Since stopping Prednisone  she has stopped taking Pepcid . No abdominal pain, shortness of breath.

## 2024-05-26 NOTE — Progress Notes (Signed)
 Established Patient Office Visit TOC from Dr. Otelia. Maribeth   Subjective  Patient ID: Pam Salazar, female    DOB: 16-Jun-1946  Age: 78 y.o. MRN: [id number]  Chief Complaint  Patient presents with   Establish Care   Head Injury    Discussed the use of AI scribe software for clinical note transcription with the patient, who gave verbal consent to proceed.  History of Present Illness Pam Salazar is a 78 year old female who presents for transfer of care from previous PCP and chronic and acute medical concerns as following:  - Positional dizziness, fall, unstable gait, chronically low blood pressure: She has been experiencing worsening balance issues and vertigo over the past couple of months. Her gait is unsteady, particularly in the mornings, and she needs to grab onto something for support.  Approximately two weeks ago, she experienced a fall while walking her dogs, resulting in a sore neck and frequent headaches. The headaches are described as a 'vice' around her head, and she had a goose egg on her head that has mostly resolved.  - ILD:  She has a history of pneumonitis, previously treated with prednisone . After completing the prednisone  course, she stopped taking Pepcid , which had been helping with chest pain. No GERD, chest pain.   - Anxiety, depression, h/o panic attacks, on chronic benzodiazepine therapy:  Clonazepam  started on 1993 for panic attacks.  She is currently taking Clonazepam  1 mg BID. Attempts to wean off the medication were intolerable.  She also takes Buspar  7.5 mg three times a day and sertraline  200 mg daily. No thoughts of self-harm, but she acknowledges feeling 'damn' at times. She endorses chronic fatigue.  Her daughter was diagnosed with acute myeloid leukemia a year ago and underwent a successful transplant, which has been a significant emotional stressor for her.  - Hyperlipidemia:  She takes simvastatin  20 mg nightly and inquires  about the necessity of CoQ10 supplementation. No muscle aches, tolerating medication well.   - Hypothyroidism:  On levothyroxine  125 mcg first thing in the morning for hypothyroidism, diagnosed around age 52. Her TSH was normal in 01/13/24.   - DEXA: 10/06/23: Osteoporosis    ROS As per HPI    Objective:     BP 90/68 (BP Location: Right Arm, Patient Position: Sitting, Cuff Size: Small)   Pulse 82   Temp 98.4 F (36.9 C) (Oral)   Ht 5' 3 (1.6 m)   Wt 125 lb 3.2 oz (56.8 kg)   SpO2 91%   BMI 22.18 kg/m      05/26/2024    3:17 PM 03/24/2024   10:32 AM 03/03/2024    2:47 PM  Depression screen PHQ 2/9  Decreased Interest 2 1 1   Down, Depressed, Hopeless 2 1 1   PHQ - 2 Score 4 2 2   Altered sleeping 1 2 2   Tired, decreased energy 3 2 2   Change in appetite 2 1 0  Feeling bad or failure about yourself  2 0 2  Trouble concentrating 0 2 2  Moving slowly or fidgety/restless 0 0 0  Suicidal thoughts 0 0 0  PHQ-9 Score 12 9  10    Difficult doing work/chores Very difficult Somewhat difficult Very difficult     Data saved with a previous flowsheet row definition      05/26/2024    3:17 PM 03/24/2024   10:32 AM 03/03/2024    2:48 PM 12/03/2023   10:06 AM  GAD 7 : Generalized Anxiety Score  Nervous, Anxious, on Edge 2 3 3  0  Control/stop worrying 2 3 3  0  Worry too much - different things 1 3 2  0  Trouble relaxing 1 3 3  0  Restless 1 3 2  0  Easily annoyed or irritable 2 3 3  0  Afraid - awful might happen 2 3 3  0  Total GAD 7 Score 11 21 19  0  Anxiety Difficulty Very difficult Not difficult at all Very difficult Not difficult at all      05/26/2024    3:17 PM 03/24/2024   10:32 AM 03/03/2024    2:47 PM  Depression screen PHQ 2/9  Decreased Interest 2 1 1   Down, Depressed, Hopeless 2 1 1   PHQ - 2 Score 4 2 2   Altered sleeping 1 2 2   Tired, decreased energy 3 2 2   Change in appetite 2 1 0  Feeling bad or failure about yourself  2 0 2  Trouble concentrating 0 2 2  Moving  slowly or fidgety/restless 0 0 0  Suicidal thoughts 0 0 0  PHQ-9 Score 12 9  10    Difficult doing work/chores Very difficult Somewhat difficult Very difficult     Data saved with a previous flowsheet row definition      05/26/2024    3:17 PM 03/24/2024   10:32 AM 03/03/2024    2:48 PM 12/03/2023   10:06 AM  GAD 7 : Generalized Anxiety Score  Nervous, Anxious, on Edge 2 3 3  0  Control/stop worrying 2 3 3  0  Worry too much - different things 1 3 2  0  Trouble relaxing 1 3 3  0  Restless 1 3 2  0  Easily annoyed or irritable 2 3 3  0  Afraid - awful might happen 2 3 3  0  Total GAD 7 Score 11 21 19  0  Anxiety Difficulty Very difficult Not difficult at all Very difficult Not difficult at all   SDOH Screenings   Food Insecurity: No Food Insecurity (05/22/2024)  Housing: Low Risk  (05/22/2024)  Transportation Needs: Unmet Transportation Needs (05/22/2024)  Utilities: Not At Risk (07/14/2023)  Alcohol Screen: Low Risk  (07/14/2023)  Depression (PHQ2-9): High Risk (05/26/2024)  Financial Resource Strain: Medium Risk (05/22/2024)  Physical Activity: Insufficiently Active (05/22/2024)  Social Connections: Socially Integrated (05/22/2024)  Stress: Stress Concern Present (05/22/2024)  Tobacco Use: Medium Risk (05/26/2024)  Health Literacy: Adequate Health Literacy (07/14/2023)    Physical Exam Constitutional:      General: She is not in acute distress.    Appearance: Normal appearance.  HENT:     Head: Normocephalic and atraumatic.     Right Ear: Ear canal normal. There is no impacted cerumen.     Left Ear: Ear canal normal. There is no impacted cerumen.     Mouth/Throat:     Mouth: Mucous membranes are moist.  Neck:     Thyroid : No thyroid  mass or thyroid  tenderness.  Cardiovascular:     Rate and Rhythm: Normal rate and regular rhythm.     Heart sounds: No murmur heard. Pulmonary:     Effort: Pulmonary effort is normal.     Breath sounds: Normal breath sounds. No wheezing.   Abdominal:     General: Bowel sounds are normal.     Palpations: Abdomen is soft.     Tenderness: There is no guarding.  Musculoskeletal:     Cervical back: Neck supple. No rigidity.     Right lower leg: No edema.     Left lower  leg: No edema.  Skin:    General: Skin is warm.  Neurological:     Mental Status: She is alert and oriented to person, place, and time.     Gait: Gait abnormal (positional dizziness, inducable. She is noted to have reduced gait speed, short stride length.).  Psychiatric:        Mood and Affect: Mood normal.        Behavior: Behavior normal.        No results found for any visits on 05/26/24.  The 10-year ASCVD risk score (Arnett DK, et al., 2019) is: 10.7%     Assessment & Plan:   Assessment & Plan Mood disorder Patient with long history of anxiety, depression, panic disorder managed with Clonazepam  1 mg BID, Sertraline  200 mg daily and Buspar  7.5 mg TID. No SI/HI.  Unsuccessful weaning off clonazepam  due to withdrawal symptoms. Discussed risks of long-term benzodiazepine use including increased risk of fall, dementia, balance concerns. Patient is aware of potential s/e and prefers to stay on current treatment. PDMP reviewed.  Continue current medications.  Orders:   busPIRone  (BUSPAR ) 7.5 MG tablet; Take 1 tablet (7.5 mg total) by mouth 3 (three) times daily.   clonazePAM  (KLONOPIN ) 1 MG tablet; Take 1 tablet (1 mg total) by mouth 2 (two) times daily.   sertraline  (ZOLOFT ) 100 MG tablet; Take 2 tablets (200 mg total) by mouth daily.  Acquired hypothyroidism Continue Levothyroxine  125 mcg daily on an empty stomach.  Check TSH. Orders:   levothyroxine  (SYNTHROID ) 125 MCG tablet; Take 1 tablet (125 mcg total) by mouth daily before breakfast.   TSH; Future  Hypercholesterolemia Continue Simvastatin  20 mg at bedtime, tolerating well. Discussed with the patient that since she has been tolerating Simvastatin  there is no proven benefit of adding  CoQ10. Patient agrees. Check fasting lipid panel and CMP.  Orders:   simvastatin  (ZOCOR ) 20 MG tablet; Take 1 tablet (20 mg total) by mouth every evening.   Comp Met (CMET); Future   Lipid panel; Future  Recurrent major depressive disorder, in partial remission Plan per mood disorder.    Balance disorder Discussed hydration importance due to low blood pressure history. Ensure daily water  intake of 40-50 ounces. PT/OT discussed physical therapy for strengthening and fall risk reduction. Refer to physical therapy for muscle strengthening exercises.     Fall, initial encounter Plan per balance disorder. Orders:   B12; Future   Ambulatory referral to Physical Therapy  Vertigo Positional, ineducable vertigo suggestive of benign paroxysmal positional vertigo. Referral to PT/ OT for vestibular therapy.   Orders:   Ambulatory referral to Physical Therapy   Ambulatory referral to Occupational Therapy  Paraspinal muscle spasm Cervical paraspinal muscle spasm, left worse than right. Physical therapy referral made. Avoiding muscle relaxant due to increased risk of fall.   Orders:   Ambulatory referral to Physical Therapy  Age-related osteoporosis without current pathological fracture I reviewed her imaging result after patient left today's visit. She is listed to have Pending Prolia approval as of 03/2024. My chart message sent to the patient with results and recommendations to see endocrinologist. Pending patient message and will put in a endocrine referral. Patient with h/o dysphagia, recent treatment with prolonged prednisone  for respiratory concerns so I would not recommend oral bisphosphonate.   Orders:   Vitamin D  (25 hydroxy); Future  ILD (interstitial lung disease) (HCC) Diagnosed with hypersensitivity pneumonitis and finished Prednisone  treatment, following up with pulmonologist Dr. Geronimo.  Since stopping Prednisone  she has stopped taking  Pepcid . No abdominal pain, shortness  of breath.      Generalized anxiety disorder Plan per mood disorder.    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      I personally spent a total of 45 minutes in the care of the patient today including preparing to see the patient, getting/reviewing separately obtained history, performing a medically appropriate exam/evaluation, counseling and educating, placing orders, referring and communicating with other health care professionals, documenting clinical information in the EHR, independently interpreting results, and communicating results.   Return in about 3 months (around 08/24/2024) for Fasting lab at patient's convinience and f/u with Dr. Abbey in 3 Months for chronic .   Luke Abbey, MD

## 2024-05-27 ENCOUNTER — Telehealth: Payer: Self-pay | Admitting: *Deleted

## 2024-05-27 ENCOUNTER — Ambulatory Visit: Payer: Self-pay

## 2024-05-27 ENCOUNTER — Other Ambulatory Visit: Payer: Self-pay

## 2024-05-27 ENCOUNTER — Other Ambulatory Visit

## 2024-05-27 DIAGNOSIS — M25551 Pain in right hip: Secondary | ICD-10-CM | POA: Diagnosis not present

## 2024-05-27 DIAGNOSIS — W19XXXA Unspecified fall, initial encounter: Secondary | ICD-10-CM | POA: Diagnosis not present

## 2024-05-27 DIAGNOSIS — E039 Hypothyroidism, unspecified: Secondary | ICD-10-CM | POA: Diagnosis not present

## 2024-05-27 DIAGNOSIS — E7800 Pure hypercholesterolemia, unspecified: Secondary | ICD-10-CM | POA: Diagnosis not present

## 2024-05-27 DIAGNOSIS — R2689 Other abnormalities of gait and mobility: Secondary | ICD-10-CM

## 2024-05-27 DIAGNOSIS — M25812 Other specified joint disorders, left shoulder: Secondary | ICD-10-CM | POA: Diagnosis not present

## 2024-05-27 DIAGNOSIS — M810 Age-related osteoporosis without current pathological fracture: Secondary | ICD-10-CM

## 2024-05-27 DIAGNOSIS — M19012 Primary osteoarthritis, left shoulder: Secondary | ICD-10-CM | POA: Diagnosis not present

## 2024-05-27 DIAGNOSIS — M5416 Radiculopathy, lumbar region: Secondary | ICD-10-CM | POA: Diagnosis not present

## 2024-05-27 LAB — VITAMIN B12: Vitamin B-12: 581 pg/mL (ref 211–911)

## 2024-05-27 LAB — COMPREHENSIVE METABOLIC PANEL WITH GFR
ALT: 15 U/L (ref 0–35)
AST: 28 U/L (ref 0–37)
Albumin: 4.6 g/dL (ref 3.5–5.2)
Alkaline Phosphatase: 54 U/L (ref 39–117)
BUN: 28 mg/dL — ABNORMAL HIGH (ref 6–23)
CO2: 31 meq/L (ref 19–32)
Calcium: 9.7 mg/dL (ref 8.4–10.5)
Chloride: 102 meq/L (ref 96–112)
Creatinine, Ser: 0.96 mg/dL (ref 0.40–1.20)
GFR: 56.88 mL/min — ABNORMAL LOW (ref >60.00)
Glucose, Bld: 80 mg/dL (ref 70–99)
Potassium: 3.9 meq/L (ref 3.5–5.1)
Sodium: 142 meq/L (ref 135–145)
Total Bilirubin: 0.7 mg/dL (ref 0.2–1.2)
Total Protein: 6.4 g/dL (ref 6.0–8.3)

## 2024-05-27 LAB — LIPID PANEL
Cholesterol: 185 mg/dL (ref 0–200)
HDL: 55 mg/dL (ref >39.00)
LDL Cholesterol: 97 mg/dL (ref 0–99)
NonHDL: 129.92
Total CHOL/HDL Ratio: 3
Triglycerides: 164 mg/dL — ABNORMAL HIGH (ref 0.0–149.0)
VLDL: 32.8 mg/dL (ref 0.0–40.0)

## 2024-05-27 LAB — TSH: TSH: 3.5 u[IU]/mL (ref 0.35–5.50)

## 2024-05-27 LAB — VITAMIN D 25 HYDROXY (VIT D DEFICIENCY, FRACTURES): VITD: 108.64 ng/mL (ref 30.00–100.00)

## 2024-05-27 NOTE — Progress Notes (Signed)
 Talked to the patient on the phone updated her on results. She is taking vitamin D3 with K2, recommend she hold off on taking this till her appointment with me in 08/2024. Patient agrees.  TSH, lipid panel, B12, CMP stable.  Luke Shade, MD

## 2024-05-27 NOTE — Telephone Encounter (Signed)
 CRITICAL VALUE STICKER  CRITICAL VALUE: Vitamin D -108.64  RECEIVER (on-site recipient of call): Sharene ORN, CMA  DATE & TIME NOTIFIED: 05/27/24 @ 4:40pm  MESSENGER (representative from lab): Saa  MD NOTIFIED: Dr Abbey  TIME OF NOTIFICATION: 4:42pm  RESPONSE:

## 2024-06-02 DIAGNOSIS — F411 Generalized anxiety disorder: Secondary | ICD-10-CM

## 2024-06-03 NOTE — Telephone Encounter (Signed)
 Copied from CRM #[id number]. Topic: Clinical - Medication Question >> Jun 02, 2024  4:23 PM Shereese L wrote: Reason for CRM: Patient stated that her hair is thinning and was inquiring about the minoxidil  for women oral. Patient is requesting a call back

## 2024-06-10 ENCOUNTER — Ambulatory Visit: Payer: Self-pay

## 2024-06-10 NOTE — Telephone Encounter (Signed)
 FYI Only or Action Required?: FYI only for provider: appointment scheduled on 06/14/24.  Patient was last seen in primary care on 05/26/2024 by Abbey Bruckner, MD.  Called Nurse Triage reporting Nasal Congestion and Headache.  Symptoms began several days ago.  Interventions attempted: OTC medications: Mucinex .  Symptoms are: gradually worsening.  Triage Disposition: See PCP When Office is Open (Within 3 Days)  Patient/caregiver understands and will follow disposition?: Yes  Reason for Disposition  [1] Nasal discharge AND [2] present > 10 days  Answer Assessment - Initial Assessment Questions 1. LOCATION: Where does it hurt?      Headache with pain above eyebrows  2. ONSET: When did the sinus pain start?  (e.g., hours, days)      Yesterday  3. SEVERITY: How bad is the pain?   (Scale 0-10; or none, mild, moderate or severe) 5/10  4. RECURRENT SYMPTOM: Have you ever had sinus problems before? If Yes, ask: When was the last time? and What happened that time?      Unknown  5. NASAL CONGESTION: Is the nose blocked? If Yes, ask: Can you open it or must you breathe through your mouth?     Yes, able to clear nose  6. NASAL DISCHARGE: Do you have discharge from your nose? If so ask, What color?     Yes  7. FEVER: Do you have a fever? If Yes, ask: What is it, how was it measured, and when did it start?      No  8. OTHER SYMPTOMS: Do you have any other symptoms? (e.g., sore throat, cough, earache, difficulty breathing)     Sore throat, sneezing, watery/burning eyes with redness  9. PREGNANCY: Is there any chance you are pregnant? When was your last menstrual period?     NA  Protocols used: Sinus Pain or Congestion-A-AH  Copied from CRM #[id number]. Topic: Clinical - Red Word Triage >> Jun 10, 2024 11:47 AM Adelita E wrote: [facility] that prompted transfer to Nurse Triage: Head pain, nasal drip, burning/watery eyes. Symptoms going on for 2 days.

## 2024-06-11 NOTE — Telephone Encounter (Signed)
Will evaluate.

## 2024-06-14 ENCOUNTER — Ambulatory Visit: Admitting: Primary Care

## 2024-06-30 ENCOUNTER — Ambulatory Visit

## 2024-06-30 DIAGNOSIS — R2681 Unsteadiness on feet: Secondary | ICD-10-CM | POA: Insufficient documentation

## 2024-06-30 DIAGNOSIS — R2689 Other abnormalities of gait and mobility: Secondary | ICD-10-CM | POA: Diagnosis present

## 2024-06-30 DIAGNOSIS — R269 Unspecified abnormalities of gait and mobility: Secondary | ICD-10-CM | POA: Insufficient documentation

## 2024-06-30 DIAGNOSIS — R42 Dizziness and giddiness: Secondary | ICD-10-CM | POA: Diagnosis not present

## 2024-06-30 DIAGNOSIS — M6281 Muscle weakness (generalized): Secondary | ICD-10-CM | POA: Diagnosis not present

## 2024-06-30 DIAGNOSIS — W19XXXA Unspecified fall, initial encounter: Secondary | ICD-10-CM | POA: Insufficient documentation

## 2024-06-30 DIAGNOSIS — M62830 Muscle spasm of back: Secondary | ICD-10-CM | POA: Insufficient documentation

## 2024-06-30 NOTE — Therapy (Signed)
 " OUTPATIENT PHYSICAL THERAPY NEURO EVALUATION   Patient Name: Pam Salazar MRN: [id number] DOB:1945/09/06, 79 y.o., female Today's Date: 07/01/2024   PCP: Dr. Luke Shade   REFERRING PROVIDER: Dr. Luke Shade  END OF SESSION:  PT End of Session - 07/01/24 2004     Visit Number 1    Number of Visits 24    Date for Recertification  09/22/24    Progress Note Due on Visit 10    PT Start Time 1100    PT Stop Time 1145    PT Time Calculation (min) 45 min    Equipment Utilized During Treatment Gait belt    Activity Tolerance Patient tolerated treatment well    Behavior During Therapy WFL for tasks assessed/performed           Past Medical History:  Diagnosis Date   Angiodysplasia of intestine with hemorrhage    Arthritis    hips, lower back   Basal cell carcinoma    Breast lump 10/21/2011   Overview:  Negative diagnostic mammogram 09/2011    Depression    Fracture of rib of right side 08/17/2013   Hypercholesterolemia    Hypothyroidism    Low blood pressure 10/25/2014   Mold exposure 03/21/2024   Panic disorder    previous agoraphobia   Seizures (HCC)    febrile - as infant   Shoulder pain, left    bursa   Squamous cell carcinoma of skin    Squamous cell carcinoma of skin 08/07/2022   L lateral lower leg, EDC sched 09/04/22 at 11:45 AM   Vertigo    positional - rare   Vitamin D  deficiency    Past Surgical History:  Procedure Laterality Date   ABDOMINAL HYSTERECTOMY  1981   partial - secondary to bleeding, ovaries not removed -  vaginal   APPENDECTOMY     BRONCHIAL BIOPSY  02/10/2024   Procedure: BRONCHOSCOPY, WITH BIOPSY;  Surgeon: Geronimo Amel, MD;  Location: [facility];  Service: Endoscopy;;   BRONCHIAL WASHINGS  02/10/2024   Procedure: IRRIGATION, BRONCHUS;  Surgeon: Geronimo Amel, MD;  Location: [facility];  Service: Endoscopy;;   CATARACT EXTRACTION W/ INTRAOCULAR LENS  IMPLANT, BILATERAL     COLONOSCOPY WITH PROPOFOL  N/A  05/05/2015   Procedure: COLONOSCOPY WITH PROPOFOL ;  Surgeon: Rogelia Copping, MD;  Location: [facility];  Service: Endoscopy;  Laterality: N/A;   COLONOSCOPY WITH PROPOFOL  N/A 06/26/2021   Procedure: COLONOSCOPY WITH PROPOFOL ;  Surgeon: Therisa Bi, MD;  Location: [facility];  Service: Gastroenterology;  Laterality: N/A;   JOINT REPLACEMENT  09/28/2020   MASS EXCISION Right 12/24/2017   Procedure: EXCISION MASS ( SHIN );  Surgeon: Jama Cordella MATSU, MD;  Location: [facility];  Service: Vascular;  Laterality: Right;   ORIF FIBULA FRACTURE Right 2003   right lower extremitiy   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   shoulder Left    bone suprs   SHOULDER SURGERY  12/2015   TONSILLECTOMY  1974   TOTAL HIP ARTHROPLASTY Right 03/2023   VIDEO BRONCHOSCOPY N/A 02/10/2024   Procedure: BRONCHOSCOPY, WITH FLUOROSCOPY;  Surgeon: Geronimo Amel, MD;  Location: [facility];  Service: Endoscopy;  Laterality: N/A;   Patient Active Problem List   Diagnosis Date Noted   Fall 05/26/2024   Paraspinal muscle spasm 05/26/2024   Generalized anxiety disorder 05/26/2024   Mold exposure 03/21/2024   ILD (interstitial lung disease) (HCC) 02/10/2024   Multiple lung nodules 02/10/2024   Interstitial lung abnormality (ILA) 01/14/2024  Osteoporosis 11/17/2023   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 07/15/2023   Depression 07/15/2023   History of cigarette smoking 01/31/2023   Balance disorder 06/05/2020   Arthritis 01/15/2019   Idiopathic colitis    Vertigo 03/19/2015   Hypercholesterolemia 03/07/2013   Hypothyroidism 03/07/2013    ONSET DATE: Progressive over 6 months ago  REFERRING DIAG: Fall, Vertigo, Paraspinal Muscle spasm  THERAPY DIAG:  Imbalance - Plan: PT plan of care cert/re-cert  Unsteady gait - Plan: PT plan of care cert/re-cert  Abnormality of gait and mobility - Plan: PT plan of care cert/re-cert  Muscle weakness (generalized) - Plan: PT plan of care  cert/re-cert  Rationale for Evaluation and Treatment: Rehabilitation  SUBJECTIVE:                                                                                                                                                                                             SUBJECTIVE STATEMENT: Patient reports she is here due to difficulty with balance with 2 recent falls in past 6 month. States she has dogs and difficulty walking the dogs. States she has known history of hypotension and has been tracking her Blood pressure for her PCP.  Reports she knows the MD put in a referral for vertigo which has been okay most recently and for neck pain which has since improved.    Pt accompanied by: self  PERTINENT HISTORY:  Per Dr. Abbey office visit note on 05/26/2024: Balance disorder Discussed hydration importance due to low blood pressure history. Ensure daily water  intake of 40-50 ounces. PT/OT discussed physical therapy for strengthening and fall risk reduction. Refer to physical therapy for muscle strengthening exercises.   Fall, initial encounter Plan per balance disorder. Orders:   B12; Future   Ambulatory referral to Physical Therapy   Vertigo Positional, ineducable vertigo suggestive of benign paroxysmal positional vertigo. Referral to PT/ OT for vestibular therapy.   Orders:   Ambulatory referral to Physical Therapy   Ambulatory referral to Occupational Therapy   Paraspinal muscle spasm Cervical paraspinal muscle spasm, left worse than right. Physical therapy referral made. Avoiding muscle relaxant due to increased risk of fall.     PAIN:  Are you having pain? Not currently but most recent cervical pain which has improved.   PRECAUTIONS: Fall  RED FLAGS: Bowel or bladder incontinence: Yes: urge, Frequency   WEIGHT BEARING RESTRICTIONS: No  FALLS: Has patient fallen in last 6 months? Yes. Number of falls 2  LIVING ENVIRONMENT: Lives with: lives with their spouse Lives in:  House/apartment Stairs: Yes: Internal: 15 steps; on left going up and External: 3 in garage steps;  freezer on right side that she holds up Has following equipment at home: Single point cane, Walker - 2 wheeled, and shower chair  PLOF: Independent  PATIENT GOALS: To be able to stand up without losing my balance; not fall; be stable  OBJECTIVE:  Note: Objective measures were completed at Evaluation unless otherwise noted.  DIAGNOSTIC FINDINGS: CLINICAL DATA:  low back pain post-surgery, eval for compression fracture or bony injury.   EXAM: LUMBAR SPINE - COMPLETE 4+ VIEW   COMPARISON:  04/12/2014.   FINDINGS: There are 5 nonrib-bearing lumbar vertebrae.   Anatomic lumbar curvature.   No spondylolysis. Minimal/grade 1 retrolisthesis of L2 over L3 and minimal/grade 1 anterolisthesis of L3 over L4, most likely degenerative.   Vertebral body heights are maintained.   No aggressive osseous lesion.   Mild-moderate multilevel degenerative changes in the form of reduced intervertebral disc height, endplate sclerosis/irregularity, facet arthropathy and marginal osteophyte formation.   Sacroiliac joints are symmetric.   Visualized soft tissues are within normal limits.   Right hip arthroplasty noted.   IMPRESSION: No acute osseous abnormality of the lumbar spine. Mild-to-moderate multilevel degenerative changes.     Electronically Signed   By: Ree Molt M.D.   On: 02/12/2024 17:13    COGNITION: Overall cognitive status: Within functional limits for tasks assessed   SENSATION: WFL  COORDINATION: Impaired with turning and walking/turning  EDEMA:  None observed either LE   POSTURE: rounded shoulders and forward head  LOWER EXTREMITY ROM:     Active  Right Eval Left Eval  Hip flexion    Hip extension    Hip abduction    Hip adduction    Hip internal rotation    Hip external rotation    Knee flexion    Knee extension    Ankle dorsiflexion    Ankle  plantarflexion    Ankle inversion    Ankle eversion     (Blank rows = not tested)  LOWER EXTREMITY MMT:    MMT Right Eval Left Eval  Hip flexion 4 4  Hip extension 4 4  Hip abduction 4 4  Hip adduction 4 4  Hip internal rotation    Hip external rotation    Knee flexion 4 4  Knee extension 4 4  Ankle dorsiflexion 4 4  Ankle plantarflexion    Ankle inversion    Ankle eversion    (Blank rows = not tested)  BED MOBILITY:  Not tested- Patient verbalized independent without issues  TRANSFERS: Sit to stand: Complete Independence  Assistive device utilized: None     Stand to sit: Complete Independence  Assistive device utilized: None     Chair to chair: Complete Independence  Assistive device utilized: None       RAMP:  Not tested  CURB:  Not tested  STAIRS: Not tested GAIT: Findings: Gait Characteristics: decreased arm swing- Right, decreased arm swing- Left, decreased step length- Right, decreased step length- Left, and decreased stride length, Distance walked: approx 100 feet, Assistive device utilized:None, and Level of assistance: SBA  FUNCTIONAL TESTS:  30 seconds chair stand test= 10 reps without UE Support Timed up and go (TUG): 11.44 and 11.78 sec; or 11.61 sec avg without AD 10 meter walk test: 12.23 and 12.45 sec - or  0.81 m/s avg BERG: 49/56 FGA: Next visit or Minibest   [facility] PT Assessment - 07/01/24 1426       Standardized Balance Assessment   Standardized Balance Assessment Berg Balance Test  Berg Balance Test   Sit to Stand Able to stand without using hands and stabilize independently    Standing Unsupported Able to stand safely 2 minutes    Sitting with Back Unsupported but Feet Supported on Floor or Stool Able to sit safely and securely 2 minutes    Stand to Sit Sits safely with minimal use of hands    Transfers Able to transfer safely, minor use of hands    Standing Unsupported with Eyes Closed Able to stand 10 seconds with supervision     Standing Unsupported with Feet Together Able to place feet together independently and stand for 1 minute with supervision    From Standing, Reach Forward with Outstretched Arm Can reach forward >12 cm safely (5)    From Standing Position, Pick up Object from Floor Able to pick up shoe, needs supervision    From Standing Position, Turn to Look Behind Over each Shoulder Looks behind from both sides and weight shifts well    Turn 360 Degrees Able to turn 360 degrees safely in 4 seconds or less    Standing Unsupported, Alternately Place Feet on Step/Stool Able to stand independently and safely and complete 8 steps in 20 seconds    Standing Unsupported, One Foot in Front Able to plae foot ahead of the other independently and hold 30 seconds    Standing on One Leg Able to lift leg independently and hold equal to or more than 3 seconds    Total Score 49           PATIENT SURVEYS:  ABC scale: The Activities-Specific Balance Confidence (ABC) Scale 0% 10 20 30  40 50 60 70 80 90 100% No confidence<->completely confident  How confident are you that you will not lose your balance or become unsteady when you . . .   Date tested 06/30/2024  Walk around the house 90%  2. Walk up or down stairs 100%  3. Bend over and pick up a slipper from in front of a closet floor 60%  4. Reach for a small can off a shelf at eye level 70%  5. Stand on tip toes and reach for something above your head 30%  6. Stand on a chair and reach for something 0%  7. Sweep the floor 80%  8. Walk outside the house to a car parked in the driveway 80%  9. Get into or out of a car 70%  10. Walk across a parking lot to the mall 50%  11. Walk up or down a ramp 60%  12. Walk in a crowded mall where people rapidly walk past you 50%  13. Are bumped into by people as you walk through the mall 40%  14. Step onto or off of an escalator while you are holding onto the railing 90%  15. Step onto or off an escalator while holding onto  parcels such that you cannot hold onto the railing 40%  16. Walk outside on icy sidewalks 0%  Total: #/16 56.88%  TREATMENT DATE:   PT EVALUATION today:   SELF CARE:  85/67 HR= 69 bpm (sitting) Left UE Asymptomatic at rest 89/56 HR= 75 bpm (initial standing) LUE- Asymptomatic 88/59 HR= 79 bpm (after 3 min standing) -LUE- Asymptomatic  Added SLS (Instructed to hold up to 20+ sec  at home multiple times each)- 3x/week Added Tandem standing at kitchen counter- hold up to 30 sec x 3 each Side - 3x/week   PATIENT EDUCATION: Education details: Purpose of PT: Proposed plan of care; Symptoms of orthostatic Hypotension; Importance of continuing to keep journal of BP reading and relay to MD; Ed in basic balance HEP Person educated: Patient Education method: Explanation, Demonstration, Tactile cues, and Verbal cues Education comprehension: verbalized understanding, returned demonstration, verbal cues required, tactile cues required, and needs further education  HOME EXERCISE PROGRAM: 06/30/2024-SLS (Instructed to hold up to 20+ sec  at home multiple times each)- 3x/week Added Tandem standing at kitchen counter- hold up to 30 sec x 3 each Side - 3x/week  GOALS: Goals reviewed with patient? Yes  SHORT TERM GOALS: Target date: 08/11/2024  Pt will be independent with HEP in order to improve strength and balance in order to decrease fall risk and improve function at home and work.  Baseline: EVAL- No formal Plan in place Goal status: INITIAL  2.  Patient will deny any falls in home or community for improved safety and quality of life.  Baseline: EVAL= 2 falls in past 6 months Goal status: INITIAL  LONG TERM GOALS: Target date: 09/22/2024  1.  Patient will complete > 13 reps in 30 sec sit to stand test indicating an increased LE strength and improved balance. Baseline:  EVAL= 10 reps Goal status: INITIAL  2.  Patient will improve ABC score by 10 points to demonstrate statistically significant improvement in mobility and quality of life as it relates to their confidence in her balance.  Baseline: EVAL= 56.88% Goal status: INITIAL   3.  Patient will increase Berg Balance score by > 4 points to demonstrate decreased fall risk during functional activities. Baseline: EVAL= 49/56 Goal status: INITIAL    4. Patient will increase 10 meter walk test to >1.37m/s as to improve gait speed for better community ambulation and to reduce fall risk. Baseline: EVAL= 0.81 m/s w/o AD Goal status: INITIAL  5.   Patient will increase six minute walk test distance by 150 feet for progression to community ambulator and improve gait ability Baseline: EVAL- To be assessed next 1-2 visits Goal status: INITIAL   ASSESSMENT:  CLINICAL IMPRESSION: Patient is a 79 y.o. female who was seen today for physical therapy evaluation and treatment for history of falls, vertigo, paraspinal spasms. Today evaluation concentrated on assessing LE muscle strength, functional mobility, and balance. She presents with functional impairments including some BLE muscle weakness, decreased functional mobility including decreased gait speed and unsteadiness. Her BERG score reflects impaired balance. She may benefit from further vestibular eval if any signs or symptoms of vertigo. Patient will benefit from skilled PT services to address and improve her weakness, imbalance and assist in improving her quality of life while decreasing her risk of falling.   OBJECTIVE IMPAIRMENTS: Abnormal gait, decreased activity tolerance, decreased balance, decreased coordination, decreased endurance, decreased mobility, difficulty walking, decreased strength, dizziness, and postural dysfunction.   ACTIVITY LIMITATIONS: carrying, lifting, bending, standing, squatting, stairs, and transfers  PARTICIPATION LIMITATIONS: meal  prep, cleaning, laundry, driving, shopping, community activity, and yard work  PERSONAL FACTORS: Age, Fitness, and 1-2 comorbidities:  Hypotension, Anxiety are also affecting patient's functional outcome.   REHAB POTENTIAL: Good  CLINICAL DECISION MAKING: Evolving/moderate complexity  EVALUATION COMPLEXITY: Moderate  PLAN:  PT FREQUENCY: 1-2x/week  PT DURATION: 12 weeks  PLANNED INTERVENTIONS: [id number]- PT Re-evaluation, 97750- Physical Performance Testing, 97110-Therapeutic exercises, 97530- Therapeutic activity, [id number]- Neuromuscular re-education, 97535- Self Care, [id number]- Manual therapy, [id number]- Gait training, [id number]- Orthotic Initial, [id number]- Orthotic/Prosthetic subsequent, [phone]- Canalith repositioning, [id number]- Electrical stimulation (manual), [phone] (1-2 muscles), [id number] (3+ muscles)- Dry Needling, Patient/Family education, Balance training, Stair training, Taping, Joint mobilization, Spinal mobilization, Vestibular training, DME instructions, Cryotherapy, and Moist heat  PLAN FOR NEXT SESSION:  Assess more dynamic balance and add to goals if appropriate: Consider FGA or minibest Potential vestibular screen or further assessment if any further issues.  Review and progress balance interventions and add to HEP as appropriate.  6 min walk test- add values to established goal- Revise is needed.   Reyes LOISE London, PT 07/01/2024, 8:04 PM        "

## 2024-07-05 ENCOUNTER — Ambulatory Visit

## 2024-07-05 DIAGNOSIS — R2681 Unsteadiness on feet: Secondary | ICD-10-CM

## 2024-07-05 DIAGNOSIS — R2689 Other abnormalities of gait and mobility: Secondary | ICD-10-CM | POA: Diagnosis not present

## 2024-07-05 DIAGNOSIS — R269 Unspecified abnormalities of gait and mobility: Secondary | ICD-10-CM

## 2024-07-05 DIAGNOSIS — M6281 Muscle weakness (generalized): Secondary | ICD-10-CM

## 2024-07-05 NOTE — Therapy (Signed)
 " OUTPATIENT PHYSICAL THERAPY NEURO EVALUATION   Patient Name: Pam Salazar MRN: [id number] DOB:01-22-1946, 79 y.o., female Today's Date: 07/05/2024   PCP: Dr. Luke Shade   REFERRING PROVIDER: Dr. Luke Shade  END OF SESSION:  PT End of Session - 07/05/24 1101     Visit Number 2    Number of Visits 24    Date for Recertification  09/22/24    Progress Note Due on Visit 10    PT Start Time 1102    PT Stop Time 1144    PT Time Calculation (min) 42 min    Equipment Utilized During Treatment Gait belt    Activity Tolerance Patient tolerated treatment well    Behavior During Therapy WFL for tasks assessed/performed           Past Medical History:  Diagnosis Date   Angiodysplasia of intestine with hemorrhage    Arthritis    hips, lower back   Basal cell carcinoma    Breast lump 10/21/2011   Overview:  Negative diagnostic mammogram 09/2011    Depression    Fracture of rib of right side 08/17/2013   Hypercholesterolemia    Hypothyroidism    Low blood pressure 10/25/2014   Mold exposure 03/21/2024   Panic disorder    previous agoraphobia   Seizures (HCC)    febrile - as infant   Shoulder pain, left    bursa   Squamous cell carcinoma of skin    Squamous cell carcinoma of skin 08/07/2022   L lateral lower leg, EDC sched 09/04/22 at 11:45 AM   Vertigo    positional - rare   Vitamin D  deficiency    Past Surgical History:  Procedure Laterality Date   ABDOMINAL HYSTERECTOMY  1981   partial - secondary to bleeding, ovaries not removed -  vaginal   APPENDECTOMY     BRONCHIAL BIOPSY  02/10/2024   Procedure: BRONCHOSCOPY, WITH BIOPSY;  Surgeon: Geronimo Amel, MD;  Location: [facility];  Service: Endoscopy;;   BRONCHIAL WASHINGS  02/10/2024   Procedure: IRRIGATION, BRONCHUS;  Surgeon: Geronimo Amel, MD;  Location: [facility];  Service: Endoscopy;;   CATARACT EXTRACTION W/ INTRAOCULAR LENS  IMPLANT, BILATERAL     COLONOSCOPY WITH PROPOFOL  N/A  05/05/2015   Procedure: COLONOSCOPY WITH PROPOFOL ;  Surgeon: Rogelia Copping, MD;  Location: [facility];  Service: Endoscopy;  Laterality: N/A;   COLONOSCOPY WITH PROPOFOL  N/A 06/26/2021   Procedure: COLONOSCOPY WITH PROPOFOL ;  Surgeon: Therisa Bi, MD;  Location: [facility];  Service: Gastroenterology;  Laterality: N/A;   JOINT REPLACEMENT  09/28/2020   MASS EXCISION Right 12/24/2017   Procedure: EXCISION MASS ( SHIN );  Surgeon: Jama Cordella MATSU, MD;  Location: [facility];  Service: Vascular;  Laterality: Right;   ORIF FIBULA FRACTURE Right 2003   right lower extremitiy   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   shoulder Left    bone suprs   SHOULDER SURGERY  12/2015   TONSILLECTOMY  1974   TOTAL HIP ARTHROPLASTY Right 03/2023   VIDEO BRONCHOSCOPY N/A 02/10/2024   Procedure: BRONCHOSCOPY, WITH FLUOROSCOPY;  Surgeon: Geronimo Amel, MD;  Location: [facility];  Service: Endoscopy;  Laterality: N/A;   Patient Active Problem List   Diagnosis Date Noted   Fall 05/26/2024   Paraspinal muscle spasm 05/26/2024   Generalized anxiety disorder 05/26/2024   Mold exposure 03/21/2024   ILD (interstitial lung disease) (HCC) 02/10/2024   Multiple lung nodules 02/10/2024   Interstitial lung abnormality (ILA) 01/14/2024  Osteoporosis 11/17/2023   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 07/15/2023   Depression 07/15/2023   History of cigarette smoking 01/31/2023   Balance disorder 06/05/2020   Arthritis 01/15/2019   Idiopathic colitis    Vertigo 03/19/2015   Hypercholesterolemia 03/07/2013   Hypothyroidism 03/07/2013    ONSET DATE: Progressive over 6 months ago  REFERRING DIAG: Fall, Vertigo, Paraspinal Muscle spasm  THERAPY DIAG:  Imbalance  Unsteady gait  Abnormality of gait and mobility  Muscle weakness (generalized)  Rationale for Evaluation and Treatment: Rehabilitation  SUBJECTIVE:                                                                                                                                                                                              SUBJECTIVE STATEMENT: Patient reports doing good this morning and is well rested. Pt denies falls since previous visit. Patient reports that she has been performing her HEP at home.    Pt accompanied by: self  PERTINENT HISTORY:  Per Dr. Abbey office visit note on 05/26/2024: Balance disorder Discussed hydration importance due to low blood pressure history. Ensure daily water  intake of 40-50 ounces. PT/OT discussed physical therapy for strengthening and fall risk reduction. Refer to physical therapy for muscle strengthening exercises.   Fall, initial encounter Plan per balance disorder. Orders:   B12; Future   Ambulatory referral to Physical Therapy   Vertigo Positional, ineducable vertigo suggestive of benign paroxysmal positional vertigo. Referral to PT/ OT for vestibular therapy.   Orders:   Ambulatory referral to Physical Therapy   Ambulatory referral to Occupational Therapy   Paraspinal muscle spasm Cervical paraspinal muscle spasm, left worse than right. Physical therapy referral made. Avoiding muscle relaxant due to increased risk of fall.     PAIN:  Are you having pain? Not currently but most recent cervical pain which has improved.   PRECAUTIONS: Fall  RED FLAGS: Bowel or bladder incontinence: Yes: urge, Frequency   WEIGHT BEARING RESTRICTIONS: No  FALLS: Has patient fallen in last 6 months? Yes. Number of falls 2  LIVING ENVIRONMENT: Lives with: lives with their spouse Lives in: House/apartment Stairs: Yes: Internal: 15 steps; on left going up and External: 3 in garage steps; freezer on right side that she holds up Has following equipment at home: Single point cane, Walker - 2 wheeled, and shower chair  PLOF: Independent  PATIENT GOALS: To be able to stand up without losing my balance; not fall; be stable  OBJECTIVE:  Note: Objective measures were  completed at Evaluation unless otherwise noted.  DIAGNOSTIC FINDINGS: CLINICAL DATA:  low back pain post-surgery, eval for compression fracture or  bony injury.   EXAM: LUMBAR SPINE - COMPLETE 4+ VIEW   COMPARISON:  04/12/2014.   FINDINGS: There are 5 nonrib-bearing lumbar vertebrae.   Anatomic lumbar curvature.   No spondylolysis. Minimal/grade 1 retrolisthesis of L2 over L3 and minimal/grade 1 anterolisthesis of L3 over L4, most likely degenerative.   Vertebral body heights are maintained.   No aggressive osseous lesion.   Mild-moderate multilevel degenerative changes in the form of reduced intervertebral disc height, endplate sclerosis/irregularity, facet arthropathy and marginal osteophyte formation.   Sacroiliac joints are symmetric.   Visualized soft tissues are within normal limits.   Right hip arthroplasty noted.   IMPRESSION: No acute osseous abnormality of the lumbar spine. Mild-to-moderate multilevel degenerative changes.     Electronically Signed   By: Ree Molt M.D.   On: 02/12/2024 17:13    COGNITION: Overall cognitive status: Within functional limits for tasks assessed   SENSATION: WFL  COORDINATION: Impaired with turning and walking/turning  EDEMA:  None observed either LE   POSTURE: rounded shoulders and forward head  LOWER EXTREMITY ROM:     Active  Right Eval Left Eval  Hip flexion    Hip extension    Hip abduction    Hip adduction    Hip internal rotation    Hip external rotation    Knee flexion    Knee extension    Ankle dorsiflexion    Ankle plantarflexion    Ankle inversion    Ankle eversion     (Blank rows = not tested)  LOWER EXTREMITY MMT:    MMT Right Eval Left Eval  Hip flexion 4 4  Hip extension 4 4  Hip abduction 4 4  Hip adduction 4 4  Hip internal rotation    Hip external rotation    Knee flexion 4 4  Knee extension 4 4  Ankle dorsiflexion 4 4  Ankle plantarflexion    Ankle inversion     Ankle eversion    (Blank rows = not tested)  BED MOBILITY:  Not tested- Patient verbalized independent without issues  TRANSFERS: Sit to stand: Complete Independence  Assistive device utilized: None     Stand to sit: Complete Independence  Assistive device utilized: None     Chair to chair: Complete Independence  Assistive device utilized: None       RAMP:  Not tested  CURB:  Not tested  STAIRS: Not tested GAIT: Findings: Gait Characteristics: decreased arm swing- Right, decreased arm swing- Left, decreased step length- Right, decreased step length- Left, and decreased stride length, Distance walked: approx 100 feet, Assistive device utilized:None, and Level of assistance: SBA  FUNCTIONAL TESTS:  30 seconds chair stand test= 10 reps without UE Support Timed up and go (TUG): 11.44 and 11.78 sec; or 11.61 sec avg without AD 10 meter walk test: 12.23 and 12.45 sec - or  0.81 m/s avg BERG: 49/56 FGA: Next visit or Minibest      PATIENT SURVEYS:  ABC scale: The Activities-Specific Balance Confidence (ABC) Scale 0% 10 20 30  40 50 60 70 80 90 100% No confidence<->completely confident  How confident are you that you will not lose your balance or become unsteady when you . . .   Date tested 06/30/2024  Walk around the house 90%  2. Walk up or down stairs 100%  3. Bend over and pick up a slipper from in front of a closet floor 60%  4. Reach for a small can off a shelf at eye level  70%  5. Stand on tip toes and reach for something above your head 30%  6. Stand on a chair and reach for something 0%  7. Sweep the floor 80%  8. Walk outside the house to a car parked in the driveway 80%  9. Get into or out of a car 70%  10. Walk across a parking lot to the mall 50%  11. Walk up or down a ramp 60%  12. Walk in a crowded mall where people rapidly walk past you 50%  13. Are bumped into by people as you walk through the mall 40%  14. Step onto or off of an escalator while you are  holding onto the railing 90%  15. Step onto or off an escalator while holding onto parcels such that you cannot hold onto the railing 40%  16. Walk outside on icy sidewalks 0%  Total: #/16 56.88%                                                                                                                                 TREATMENT DATE:   6 minute walk test: 1,250'.   Reviewed HEP:   Tandem stance: 2x30'' at balance station.     Airex: 3x30'' EC with romberg stance at balance station.   Sit to stands: 2x10 with 3 kg ball.  Standing marches at balance station with 3# AW donned: x15 each.  Standing abduction at balance station with 3# AW donned: 2x10 each.   Step-ups on 6'' step 2x10 each.   PATIENT EDUCATION: Education details: Purpose of PT: Proposed plan of care; Symptoms of orthostatic Hypotension; Importance of continuing to keep journal of BP reading and relay to MD; Ed in basic balance HEP Person educated: Patient Education method: Explanation, Demonstration, Tactile cues, and Verbal cues Education comprehension: verbalized understanding, returned demonstration, verbal cues required, tactile cues required, and needs further education  HOME EXERCISE PROGRAM: 06/30/2024-SLS (Instructed to hold up to 20+ sec  at home multiple times each)- 3x/week Added Tandem standing at kitchen counter- hold up to 30 sec x 3 each Side - 3x/week  GOALS: Goals reviewed with patient? Yes  SHORT TERM GOALS: Target date: 08/11/2024  Pt will be independent with HEP in order to improve strength and balance in order to decrease fall risk and improve function at home and work.  Baseline: EVAL- No formal Plan in place Goal status: INITIAL  2.  Patient will deny any falls in home or community for improved safety and quality of life.  Baseline: EVAL= 2 falls in past 6 months Goal status: INITIAL  LONG TERM GOALS: Target date: 09/22/2024  1.  Patient will complete > 13 reps in 30 sec sit to stand  test indicating an increased LE strength and improved balance. Baseline: EVAL= 10 reps Goal status: INITIAL  2.  Patient will improve ABC score by 10 points to demonstrate statistically significant improvement in mobility and quality  of life as it relates to their confidence in her balance.  Baseline: EVAL= 56.88% Goal status: INITIAL   3.  Patient will increase Berg Balance score by > 4 points to demonstrate decreased fall risk during functional activities. Baseline: EVAL= 49/56 Goal status: INITIAL    4. Patient will increase 10 meter walk test to >1.76m/s as to improve gait speed for better community ambulation and to reduce fall risk. Baseline: EVAL= 0.81 m/s w/o AD Goal status: INITIAL  5.   Patient will increase six minute walk test distance by 150 feet for progression to community ambulator and improve gait ability Baseline: EVAL- 1,250'  Goal status: INITIAL   ASSESSMENT:  CLINICAL IMPRESSION: Patient highly motivated to improve throughout today's treatment session.   Today we began with 6 MWT assessment to complete baseline for goal. Pt performed 6 MWT well, but she did begin to have intermittent foot drag near end of test. She then performed balance activities at balance station including tandem, romberg with EC on airex for improved balance strategies. Decreased balance was noted, but improved with practice. Hip abduction, marching, and step-ups performed to increase LE strength and improve patient's ability to perform stairs in community.    Patient will benefit from skilled PT services to address and improve her weakness, imbalance and assist in improving her quality of life while decreasing her risk of falling.   OBJECTIVE IMPAIRMENTS: Abnormal gait, decreased activity tolerance, decreased balance, decreased coordination, decreased endurance, decreased mobility, difficulty walking, decreased strength, dizziness, and postural dysfunction.   ACTIVITY LIMITATIONS:  carrying, lifting, bending, standing, squatting, stairs, and transfers  PARTICIPATION LIMITATIONS: meal prep, cleaning, laundry, driving, shopping, community activity, and yard work  PERSONAL FACTORS: Age, Fitness, and 1-2 comorbidities: Hypotension, Anxiety are also affecting patient's functional outcome.   REHAB POTENTIAL: Good  CLINICAL DECISION MAKING: Evolving/moderate complexity  EVALUATION COMPLEXITY: Moderate  PLAN:  PT FREQUENCY: 1-2x/week  PT DURATION: 12 weeks  PLANNED INTERVENTIONS: [id number]- PT Re-evaluation, 97750- Physical Performance Testing, 97110-Therapeutic exercises, 97530- Therapeutic activity, [id number]- Neuromuscular re-education, 97535- Self Care, [id number]- Manual therapy, [id number]- Gait training, [id number]- Orthotic Initial, [id number]- Orthotic/Prosthetic subsequent, [phone]- Canalith repositioning, [id number]- Electrical stimulation (manual), [phone] (1-2 muscles), [id number] (3+ muscles)- Dry Needling, Patient/Family education, Balance training, Stair training, Taping, Joint mobilization, Spinal mobilization, Vestibular training, DME instructions, Cryotherapy, and Moist heat  PLAN FOR NEXT SESSION:  Assess more dynamic balance and add to goals if appropriate: Consider FGA or minibest Potential vestibular screen or further assessment if any further issues.  Review and progress balance interventions and add to HEP as appropriate.     Norman KATHEE Sharps, PT 07/05/2024, 12:33 PM        "

## 2024-07-06 ENCOUNTER — Ambulatory Visit: Admitting: Dermatology

## 2024-07-06 ENCOUNTER — Encounter: Payer: Self-pay | Admitting: Dermatology

## 2024-07-06 DIAGNOSIS — L649 Androgenic alopecia, unspecified: Secondary | ICD-10-CM

## 2024-07-06 DIAGNOSIS — L814 Other melanin hyperpigmentation: Secondary | ICD-10-CM | POA: Diagnosis not present

## 2024-07-06 DIAGNOSIS — L821 Other seborrheic keratosis: Secondary | ICD-10-CM

## 2024-07-06 NOTE — Progress Notes (Signed)
" ° °  Follow-Up Visit   Subjective  Pam Salazar is a 79 y.o. female who presents for the following: 6 month hair loss follow up. Was prescribed Minoxidil  and Finasteride  at last visit. She did not start those. She read about side effects and discussed with her PCP who advised that there are no serious health risks associated with taking these medications. She does not want facial hair growth.  Patient states she has very low blood pressure.   The following portions of the chart were reviewed this encounter and updated as appropriate: medications, allergies, medical history  Review of Systems:  No other skin or systemic complaints except as noted in HPI or Assessment and Plan.  Objective  Well appearing patient in no apparent distress; mood and affect are within normal limits.  A focused examination was performed of the following areas: Scalp   Relevant exam findings are noted in the Assessment and Plan.    Assessment & Plan   ANDROGENETIC ALOPECIA (FEMALE PATTERN HAIR LOSS) Exam: Diffuse thinning of the crown and widening of the midline part with retention of the frontal hairline   Chronic condition with duration or expected duration over one year. Currently stable compared to photos.    Female Androgenic Alopecia is a chronic condition related to genetics and/or hormonal changes.  In women androgenetic alopecia is commonly associated with menopause but may occur any time after puberty.  It causes hair thinning primarily on the crown with widening of the part and temporal hairline recession.  Can use OTC Rogaine  (minoxidil ) 5% solution/foam as directed.  Oral treatments in female patients who have no contraindication may include : - Low dose oral minoxidil  1.25 - 5mg  daily - Spironolactone 50 - 100mg  bid - Finasteride  2.5 - 5 mg daily Adjunctive therapies include: - Low Level Laser Light Therapy (LLLT) - Platelet-rich plasma injections (PRP) - Hair Transplants or scalp  reduction    Treatment Plan:  Patient defers oral and topical treatment at this time.  Hair thinning isn't worsening and is not bothersome to patient.  LENTIGINES Exam: scattered tan macules face Due to sun exposure Treatment Plan: Benign-appearing, observe. Recommend daily broad spectrum sunscreen SPF 30+ to sun-exposed areas, reapply every 2 hours as needed.  Call for any changes  SEBORRHEIC KERATOSIS - Stuck-on, waxy, tan-brown papules and/or plaques  - Benign-appearing - Discussed benign etiology and prognosis. - Observe - Call for any changes    Return for TBSE As Scheduled.  I, Jill Parcell, CMA, am acting as scribe for Rexene Rattler, MD.   Documentation: I have reviewed the above documentation for accuracy and completeness, and I agree with the above.  Rexene Rattler, MD    "

## 2024-07-06 NOTE — Patient Instructions (Addendum)
 Recommend daily broad spectrum sunscreen SPF 30+ to sun-exposed areas, reapply every 2 hours as needed. Call for new or changing lesions.  Staying in the shade or wearing long sleeves, sun glasses (UVA+UVB protection) and wide brim hats (4-inch brim around the entire circumference of the hat) are also recommended for sun protection.      Basic OTC daily skin care regimen to prevent photoaging:   Recommend facial moisturizer with sunscreen SPF 30 every morning (OTC brands include CeraVe AM, Neutrogena, Eucerin, Cetaphil, Aveeno, La Roche Posay).  Can also apply a topical Vit C serum which is an antioxidant (OTC brands include CeraVe, La Roche Posay, Neutrogena and The Ordinary) underneath sunscreen in morning. If you are outside during the day in the summer for extended periods, especially swimming and/or sweating, make sure you apply a water  resistant facial sunscreen lotion spf 30 or higher.   At night recommend a cream with retinol (a vitamin A derivative which stimulates collagen production) like CeraVe skin renewing retinol serum or ROC retinol correxion cream or Neutrogena rapid wrinkle repair cream. Retinol may cause skin irritation in people with sensitive skin.  Can use it every other day and/or apply on top of a hyaluronic acid (HA) moisturizer/serum (Neutrogena Hydroboost water  cream) if better tolerated that way.  Retinol may also help with lightening brown spots.   Our office sells high quality, medically tested skin care lines such as Elta MD sunscreens (with Zinc), and Alastin skin care products, which are very effective in treating photoaging. The Alastin line includes cosmeceutical grade Vit.C serum, HA serum, Elastin stimulating moisturizers/serums, lightening serum, and sunscreens.  If you want prescription treatment, then you would need an appointment (Rx tretinoin and fade creams, Botox, filler injections, laser treatments, etc.) These prescriptions and procedures are not covered by  insurance but work very well.   Due to recent changes in healthcare laws, you may see results of your pathology and/or laboratory studies on MyChart before the doctors have had a chance to review them. We understand that in some cases there may be results that are confusing or concerning to you. Please understand that not all results are received at the same time and often the doctors may need to interpret multiple results in order to provide you with the best plan of care or course of treatment. Therefore, we ask that you please give us  2 business days to thoroughly review all your results before contacting the office for clarification. Should we see a critical lab result, you will be contacted sooner.   If You Need Anything After Your Visit  If you have any questions or concerns for your doctor, please call our main line at [phone] and press option 4 to reach your doctor's medical assistant. If no one answers, please leave a voicemail as directed and we will return your call as soon as possible. Messages left after 4 pm will be answered the following business day.   You may also send us  a message via MyChart. We typically respond to MyChart messages within 1-2 business days.  For prescription refills, please ask your pharmacy to contact our office. Our fax number is [phone].  If you have an urgent issue when the clinic is closed that cannot wait until the next business day, you can page your doctor at the number below.    Please note that while we do our best to be available for urgent issues outside of office hours, we are not available 24/7.   If you  have an urgent issue and are unable to reach us , you may choose to seek medical care at your doctor's office, retail clinic, urgent care center, or emergency room.  If you have a medical emergency, please immediately call 911 or go to the emergency department.  Pager Numbers  - Dr. Hester: [phone]  - Dr. Jackquline:  [phone]  - Dr. Claudene: [phone]   - Dr. Raymund: [phone]  In the event of inclement weather, please call our main line at [phone] for an update on the status of any delays or closures.  Dermatology Medication Tips: Please keep the boxes that topical medications come in in order to help keep track of the instructions about where and how to use these. Pharmacies typically print the medication instructions only on the boxes and not directly on the medication tubes.   If your medication is too expensive, please contact our office at [phone] option 4 or send us  a message through MyChart.   We are unable to tell what your co-pay for medications will be in advance as this is different depending on your insurance coverage. However, we may be able to find a substitute medication at lower cost or fill out paperwork to get insurance to cover a needed medication.   If a prior authorization is required to get your medication covered by your insurance company, please allow us  1-2 business days to complete this process.  Drug prices often vary depending on where the prescription is filled and some pharmacies may offer cheaper prices.  The website www.goodrx.com contains coupons for medications through different pharmacies. The prices here do not account for what the cost may be with help from insurance (it may be cheaper with your insurance), but the website can give you the price if you did not use any insurance.  - You can print the associated coupon and take it with your prescription to the pharmacy.  - You may also stop by our office during regular business hours and pick up a GoodRx coupon card.  - If you need your prescription sent electronically to a different pharmacy, notify our office through [facility] or by phone at [phone] option 4.     Si Usted Necesita Algo Despus de Su Visita  Tambin puede enviarnos un mensaje a travs de Clinical Cytogeneticist. Por lo general  respondemos a los mensajes de MyChart en el transcurso de 1 a 2 das hbiles.  Para renovar recetas, por favor pida a su farmacia que se ponga en contacto con nuestra oficina. Randi lakes de fax es [place] [phone].  Si tiene un asunto urgente cuando la clnica est cerrada y que no puede esperar hasta el siguiente da hbil, puede llamar/localizar a su doctor(a) al nmero que aparece a continuacin.   Por favor, tenga en cuenta que aunque hacemos todo lo posible para estar disponibles para asuntos urgentes fuera del horario de [place], no estamos disponibles las 24 horas del da, los 7 [address]  [address] de la [place].   Si tiene un problema urgente y no puede comunicarse con nosotros, puede optar por buscar atencin mdica  en el consultorio de su doctor(a), en una clnica privada, en un centro de atencin urgente o en una sala de emergencias.  Si tiene engineer, drilling, por favor llame inmediatamente al 911 o vaya a la sala de emergencias.  Nmeros de bper  - Dr. Hester: [phone]  - Dra. Jackquline: [phone]  - Dr. Claudene: [phone]  - Dra. Kitts: [phone]  En caso de inclemencias  del [place], por favor llame a landry lnea principal al [phone] para una actualizacin sobre el estado de cualquier retraso o cierre.  Consejos para la medicacin en dermatologa: Por favor, guarde las cajas en las que vienen los medicamentos de uso tpico para ayudarle a seguir las instrucciones sobre dnde y cmo usarlos. Las farmacias generalmente imprimen las instrucciones del medicamento slo en las cajas y no directamente en los tubos del [place].   Si su medicamento es muy caro, por favor, pngase en contacto con landry rieger llamando al [phone] y presione la opcin 4 o envenos un mensaje a travs de Clinical Cytogeneticist.   No podemos decirle cul ser su copago por los medicamentos por adelantado ya que esto es diferente dependiendo de la cobertura de su seguro. Sin embargo, es posible  que podamos encontrar un medicamento sustituto a audiological scientist un formulario para que el seguro cubra el medicamento que se considera necesario.   Si se requiere una autorizacin previa para que su compaa de seguros cubra su medicamento, por favor permtanos de 1 a 2 das hbiles para completar este proceso.  Los precios de los medicamentos varan con frecuencia dependiendo del environmental consultant de dnde se surte la receta y alguna farmacias pueden ofrecer precios ms baratos.  El sitio web www.goodrx.com tiene cupones para medicamentos de health and safety inspector. Los precios aqu no tienen en cuenta lo que podra costar con la ayuda del seguro (puede ser ms barato con su seguro), pero el sitio web puede darle el precio si no utiliz tourist information centre manager.  - Puede imprimir el cupn correspondiente y llevarlo con su receta a la farmacia.  - Tambin puede pasar por nuestra oficina durante el horario de atencin regular y education officer, museum una tarjeta de cupones de GoodRx.  - Si necesita que su receta se enve electrnicamente a una farmacia diferente, informe a nuestra oficina a travs de MyChart de [place] o por telfono llamando al [phone] y presione la opcin 4.

## 2024-07-07 ENCOUNTER — Ambulatory Visit

## 2024-07-12 ENCOUNTER — Encounter: Payer: Self-pay | Admitting: Family

## 2024-07-12 ENCOUNTER — Ambulatory Visit: Admitting: Physical Therapy

## 2024-07-12 DIAGNOSIS — R2681 Unsteadiness on feet: Secondary | ICD-10-CM

## 2024-07-12 DIAGNOSIS — R2689 Other abnormalities of gait and mobility: Secondary | ICD-10-CM

## 2024-07-12 DIAGNOSIS — R269 Unspecified abnormalities of gait and mobility: Secondary | ICD-10-CM

## 2024-07-12 NOTE — Therapy (Signed)
 " OUTPATIENT PHYSICAL THERAPY NEURO Treatment   Patient Name: Keniah Klemmer MRN: [id number] DOB:[birth year], 79 y.o., female Today's Date: 07/12/2024   PCP: Dr. Luke Shade   REFERRING PROVIDER: Dr. Luke Shade  END OF SESSION:  PT End of Session - 07/12/24 1102     Visit Number 3    Number of Visits 24    Date for Recertification  09/22/24    Progress Note Due on Visit 10    PT Start Time 1100    PT Stop Time 1142    PT Time Calculation (min) 42 min    Equipment Utilized During Treatment Gait belt    Activity Tolerance Patient tolerated treatment well    Behavior During Therapy WFL for tasks assessed/performed            Past Medical History:  Diagnosis Date   Angiodysplasia of intestine with hemorrhage    Arthritis    hips, lower back   Basal cell carcinoma    Breast lump 10/21/2011   Overview:  Negative diagnostic mammogram 09/2011    Depression    Fracture of rib of right side 08/17/2013   Hypercholesterolemia    Hypothyroidism    Low blood pressure 10/25/2014   Mold exposure 03/21/2024   Panic disorder    previous agoraphobia   Seizures (HCC)    febrile - as infant   Shoulder pain, left    bursa   Squamous cell carcinoma of skin    Squamous cell carcinoma of skin 08/07/2022   L lateral lower leg, EDC sched 09/04/22 at 11:45 AM   Vertigo    positional - rare   Vitamin D  deficiency    Past Surgical History:  Procedure Laterality Date   ABDOMINAL HYSTERECTOMY  1981   partial - secondary to bleeding, ovaries not removed -  vaginal   APPENDECTOMY     BRONCHIAL BIOPSY  02/10/2024   Procedure: BRONCHOSCOPY, WITH BIOPSY;  Surgeon: Geronimo Amel, MD;  Location: [facility];  Service: Endoscopy;;   BRONCHIAL WASHINGS  02/10/2024   Procedure: IRRIGATION, BRONCHUS;  Surgeon: Geronimo Amel, MD;  Location: [facility];  Service: Endoscopy;;   CATARACT EXTRACTION W/ INTRAOCULAR LENS  IMPLANT, BILATERAL     COLONOSCOPY WITH PROPOFOL  N/A  05/05/2015   Procedure: COLONOSCOPY WITH PROPOFOL ;  Surgeon: Rogelia Copping, MD;  Location: [facility];  Service: Endoscopy;  Laterality: N/A;   COLONOSCOPY WITH PROPOFOL  N/A 06/26/2021   Procedure: COLONOSCOPY WITH PROPOFOL ;  Surgeon: Therisa Bi, MD;  Location: [facility];  Service: Gastroenterology;  Laterality: N/A;   JOINT REPLACEMENT  09/28/2020   MASS EXCISION Right 12/24/2017   Procedure: EXCISION MASS ( SHIN );  Surgeon: Jama Cordella MATSU, MD;  Location: [facility];  Service: Vascular;  Laterality: Right;   ORIF FIBULA FRACTURE Right 2003   right lower extremitiy   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   shoulder Left    bone suprs   SHOULDER SURGERY  12/2015   TONSILLECTOMY  1974   TOTAL HIP ARTHROPLASTY Right 03/2023   VIDEO BRONCHOSCOPY N/A 02/10/2024   Procedure: BRONCHOSCOPY, WITH FLUOROSCOPY;  Surgeon: Geronimo Amel, MD;  Location: [facility];  Service: Endoscopy;  Laterality: N/A;   Patient Active Problem List   Diagnosis Date Noted   Fall 05/26/2024   Paraspinal muscle spasm 05/26/2024   Generalized anxiety disorder 05/26/2024   Mold exposure 03/21/2024   ILD (interstitial lung disease) (HCC) 02/10/2024   Multiple lung nodules 02/10/2024   Interstitial lung abnormality (ILA) 01/14/2024  Osteoporosis 11/17/2023   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 07/15/2023   Depression 07/15/2023   History of cigarette smoking 01/31/2023   Balance disorder 06/05/2020   Arthritis 01/15/2019   Idiopathic colitis    Vertigo 03/19/2015   Hypercholesterolemia 03/07/2013   Hypothyroidism 03/07/2013    ONSET DATE: Progressive over 6 months ago  REFERRING DIAG: Fall, Vertigo, Paraspinal Muscle spasm  THERAPY DIAG:  Imbalance  Unsteady gait  Abnormality of gait and mobility  Rationale for Evaluation and Treatment: Rehabilitation  SUBJECTIVE:                                                                                                                                                                                              SUBJECTIVE STATEMENT: Pt reports doing well today. Pt denies any recent falls/stumbles since prior session. Pt denies any updates to medications or medical appointment since prior session. Pt reports good compliance with HEP when time permits.   Pt accompanied by: self  PERTINENT HISTORY:  Per Dr. Abbey office visit note on 05/26/2024: Balance disorder Discussed hydration importance due to low blood pressure history. Ensure daily water  intake of 40-50 ounces. PT/OT discussed physical therapy for strengthening and fall risk reduction. Refer to physical therapy for muscle strengthening exercises.   Fall, initial encounter Plan per balance disorder. Orders:   B12; Future   Ambulatory referral to Physical Therapy   Vertigo Positional, ineducable vertigo suggestive of benign paroxysmal positional vertigo. Referral to PT/ OT for vestibular therapy.   Orders:   Ambulatory referral to Physical Therapy   Ambulatory referral to Occupational Therapy   Paraspinal muscle spasm Cervical paraspinal muscle spasm, left worse than right. Physical therapy referral made. Avoiding muscle relaxant due to increased risk of fall.     PAIN:  Are you having pain? Not currently but most recent cervical pain which has improved.   PRECAUTIONS: Fall  RED FLAGS: Bowel or bladder incontinence: Yes: urge, Frequency   WEIGHT BEARING RESTRICTIONS: No  FALLS: Has patient fallen in last 6 months? Yes. Number of falls 2  LIVING ENVIRONMENT: Lives with: lives with their spouse Lives in: House/apartment Stairs: Yes: Internal: 15 steps; on left going up and External: 3 in garage steps; freezer on right side that she holds up Has following equipment at home: Single point cane, Walker - 2 wheeled, and shower chair  PLOF: Independent  PATIENT GOALS: To be able to stand up without losing my balance; not fall; be stable  OBJECTIVE:  Note:  Objective measures were completed at Evaluation unless otherwise noted.  DIAGNOSTIC FINDINGS: CLINICAL DATA:  low back pain post-surgery, eval for compression  fracture or bony injury.   EXAM: LUMBAR SPINE - COMPLETE 4+ VIEW   COMPARISON:  04/12/2014.   FINDINGS: There are 5 nonrib-bearing lumbar vertebrae.   Anatomic lumbar curvature.   No spondylolysis. Minimal/grade 1 retrolisthesis of L2 over L3 and minimal/grade 1 anterolisthesis of L3 over L4, most likely degenerative.   Vertebral body heights are maintained.   No aggressive osseous lesion.   Mild-moderate multilevel degenerative changes in the form of reduced intervertebral disc height, endplate sclerosis/irregularity, facet arthropathy and marginal osteophyte formation.   Sacroiliac joints are symmetric.   Visualized soft tissues are within normal limits.   Right hip arthroplasty noted.   IMPRESSION: No acute osseous abnormality of the lumbar spine. Mild-to-moderate multilevel degenerative changes.     Electronically Signed   By: Ree Molt M.D.   On: 02/12/2024 17:13    COGNITION: Overall cognitive status: Within functional limits for tasks assessed   SENSATION: WFL  COORDINATION: Impaired with turning and walking/turning  EDEMA:  None observed either LE   POSTURE: rounded shoulders and forward head  LOWER EXTREMITY ROM:     Active  Right Eval Left Eval  Hip flexion    Hip extension    Hip abduction    Hip adduction    Hip internal rotation    Hip external rotation    Knee flexion    Knee extension    Ankle dorsiflexion    Ankle plantarflexion    Ankle inversion    Ankle eversion     (Blank rows = not tested)  LOWER EXTREMITY MMT:    MMT Right Eval Left Eval  Hip flexion 4 4  Hip extension 4 4  Hip abduction 4 4  Hip adduction 4 4  Hip internal rotation    Hip external rotation    Knee flexion 4 4  Knee extension 4 4  Ankle dorsiflexion 4 4  Ankle plantarflexion     Ankle inversion    Ankle eversion    (Blank rows = not tested)  BED MOBILITY:  Not tested- Patient verbalized independent without issues  TRANSFERS: Sit to stand: Complete Independence  Assistive device utilized: None     Stand to sit: Complete Independence  Assistive device utilized: None     Chair to chair: Complete Independence  Assistive device utilized: None       RAMP:  Not tested  CURB:  Not tested  STAIRS: Not tested GAIT: Findings: Gait Characteristics: decreased arm swing- Right, decreased arm swing- Left, decreased step length- Right, decreased step length- Left, and decreased stride length, Distance walked: approx 100 feet, Assistive device utilized:None, and Level of assistance: SBA  FUNCTIONAL TESTS:  30 seconds chair stand test= 10 reps without UE Support Timed up and go (TUG): 11.44 and 11.78 sec; or 11.61 sec avg without AD 10 meter walk test: 12.23 and 12.45 sec - or  0.81 m/s avg BERG: 49/56 FGA: Next visit or Minibest      PATIENT SURVEYS:  ABC scale: The Activities-Specific Balance Confidence (ABC) Scale 0% 10 20 30  40 50 60 70 80 90 100% No confidence<->completely confident  How confident are you that you will not lose your balance or become unsteady when you . . .   Date tested 06/30/2024  Walk around the house 90%  2. Walk up or down stairs 100%  3. Bend over and pick up a slipper from in front of a closet floor 60%  4. Reach for a small can off a shelf at  eye level 70%  5. Stand on tip toes and reach for something above your head 30%  6. Stand on a chair and reach for something 0%  7. Sweep the floor 80%  8. Walk outside the house to a car parked in the driveway 80%  9. Get into or out of a car 70%  10. Walk across a parking lot to the mall 50%  11. Walk up or down a ramp 60%  12. Walk in a crowded mall where people rapidly walk past you 50%  13. Are bumped into by people as you walk through the mall 40%  14. Step onto or off of an  escalator while you are holding onto the railing 90%  15. Step onto or off an escalator while holding onto parcels such that you cannot hold onto the railing 40%  16. Walk outside on icy sidewalks 0%  Total: #/16 56.88%                                                                                                                                 TREATMENT DATE:   BP: 83/74 mmHg with HR 88 Standing BP 84/ 58 ( MAP 67)   TA- To improve functional movements patterns for everyday tasks   Gait with 2.5# AW x 450 ft  STS x 10 with 3KG ball  BP and HR: 86/62 (MAP) 71 HR 99 SPO2: 98%  Gait with 2.5# AW x 450 ft  STS x 10 with 3KG ball  Gait with 2.5# AW x 450 ft  STS x 10 with 3KG ball   Standing abduction at balance station with 3# AW donned: 2x10 each.   Standing marches at balance station with 3# AW donned: 2x10 each.   NMR: To facilitate reeducation of movement, balance, posture, coordination, and/or proprioception/kinesthetic sense.  Airex: 3x30'' NBOS 3 x 30 sec ea LE    Standing heel raise 3 x 12 reps no UE assist for NBOS balance and weight shifting practice      PATIENT EDUCATION: Education details: Purpose of PT: Proposed plan of care; Symptoms of orthostatic Hypotension; Importance of continuing to keep journal of BP reading and relay to MD; Ed in basic balance HEP Person educated: Patient Education method: Explanation, Demonstration, Tactile cues, and Verbal cues Education comprehension: verbalized understanding, returned demonstration, verbal cues required, tactile cues required, and needs further education  HOME EXERCISE PROGRAM: 06/30/2024-SLS (Instructed to hold up to 20+ sec  at home multiple times each)- 3x/week Added Tandem standing at kitchen counter- hold up to 30 sec x 3 each Side - 3x/week  GOALS: Goals reviewed with patient? Yes  SHORT TERM GOALS: Target date: 08/11/2024  Pt will be independent with HEP in order to improve strength and balance in order  to decrease fall risk and improve function at home and work.  Baseline: EVAL- No formal Plan in place Goal status: INITIAL  2.  Patient will  deny any falls in home or community for improved safety and quality of life.  Baseline: EVAL= 2 falls in past 6 months Goal status: INITIAL  LONG TERM GOALS: Target date: 09/22/2024  1.  Patient will complete > 13 reps in 30 sec sit to stand test indicating an increased LE strength and improved balance. Baseline: EVAL= 10 reps Goal status: INITIAL  2.  Patient will improve ABC score by 10 points to demonstrate statistically significant improvement in mobility and quality of life as it relates to their confidence in her balance.  Baseline: EVAL= 56.88% Goal status: INITIAL   3.  Patient will increase Berg Balance score by > 4 points to demonstrate decreased fall risk during functional activities. Baseline: EVAL= 49/56 Goal status: INITIAL    4. Patient will increase 10 meter walk test to >1.10m/s as to improve gait speed for better community ambulation and to reduce fall risk. Baseline: EVAL= 0.81 m/s w/o AD Goal status: INITIAL  5.   Patient will increase six minute walk test distance by 150 feet for progression to community ambulator and improve gait ability Baseline: EVAL- 1,250'  Goal status: INITIAL   ASSESSMENT:  CLINICAL IMPRESSION: Patient presents with good motivation for completion of physical therapy activities.  Patient progresses with lower extremity strength, endurance, and functional balance training this date in order to target her impairments and improve her gait and mobility quality.  Patient does still demonstrate foot drag without cues and attention to her feet during gait.  Patient also shows increased foot drag when distracted such as when speaking about things to therapist or acknowledging other things around clinic when she is walking.  With increased focus as well as attention to these details patient did show improved  gait quality but still requires increased practice with this to ensure consistent and safe completion.  Patient having generally orthostatic numbers at start of therapy but is asymptomatic.  Patient instructed to contact her doctor regarding this particularly if she is symptomatic.Pt will continue to benefit from skilled physical therapy intervention to address impairments, improve QOL, and attain therapy goals.    OBJECTIVE IMPAIRMENTS: Abnormal gait, decreased activity tolerance, decreased balance, decreased coordination, decreased endurance, decreased mobility, difficulty walking, decreased strength, dizziness, and postural dysfunction.   ACTIVITY LIMITATIONS: carrying, lifting, bending, standing, squatting, stairs, and transfers  PARTICIPATION LIMITATIONS: meal prep, cleaning, laundry, driving, shopping, community activity, and yard work  PERSONAL FACTORS: Age, Fitness, and 1-2 comorbidities: Hypotension, Anxiety are also affecting patient's functional outcome.   REHAB POTENTIAL: Good  CLINICAL DECISION MAKING: Evolving/moderate complexity  EVALUATION COMPLEXITY: Moderate  PLAN:  PT FREQUENCY: 1-2x/week  PT DURATION: 12 weeks  PLANNED INTERVENTIONS: [id number]- PT Re-evaluation, 97750- Physical Performance Testing, 97110-Therapeutic exercises, 97530- Therapeutic activity, [id number]- Neuromuscular re-education, 97535- Self Care, [id number]- Manual therapy, [id number]- Gait training, [id number]- Orthotic Initial, [id number]- Orthotic/Prosthetic subsequent, [phone]- Canalith repositioning, [id number]- Electrical stimulation (manual), [phone] (1-2 muscles), [id number] (3+ muscles)- Dry Needling, Patient/Family education, Balance training, Stair training, Taping, Joint mobilization, Spinal mobilization, Vestibular training, DME instructions, Cryotherapy, and Moist heat  PLAN FOR NEXT SESSION:  Assess more dynamic balance and add to goals if appropriate: Consider FGA or minibest Potential vestibular screen or further assessment if  any further issues.  Review and progress balance interventions and add to HEP as appropriate.     Lonni KATHEE Gainer, PT 07/12/2024, 11:03 AM        "

## 2024-07-12 NOTE — Addendum Note (Signed)
 Addended by: Athenia Rys on: 07/12/2024 07:50 AM   Modules accepted: Orders

## 2024-07-12 NOTE — Telephone Encounter (Signed)
 1. Generalized anxiety disorder (Primary) - Ambulatory referral to Psychology  Luke Shade, MD

## 2024-07-13 ENCOUNTER — Ambulatory Visit

## 2024-07-14 ENCOUNTER — Ambulatory Visit: Admitting: Physical Therapy

## 2024-07-14 ENCOUNTER — Telehealth: Payer: Self-pay

## 2024-07-14 ENCOUNTER — Ambulatory Visit: Payer: Medicare Other

## 2024-07-14 DIAGNOSIS — R269 Unspecified abnormalities of gait and mobility: Secondary | ICD-10-CM

## 2024-07-14 DIAGNOSIS — R2681 Unsteadiness on feet: Secondary | ICD-10-CM

## 2024-07-14 DIAGNOSIS — R2689 Other abnormalities of gait and mobility: Secondary | ICD-10-CM

## 2024-07-14 DIAGNOSIS — M6281 Muscle weakness (generalized): Secondary | ICD-10-CM

## 2024-07-14 NOTE — Therapy (Signed)
 " OUTPATIENT PHYSICAL THERAPY NEURO Treatment   Patient Name: Pam Salazar MRN: [id number] DOB:08/11/1945, 79 y.o., female Today's Date: 07/14/2024   PCP: Dr. Luke Shade   REFERRING PROVIDER: Dr. Luke Shade  END OF SESSION:  PT End of Session - 07/14/24 1540     Visit Number 4    Number of Visits 24    Date for Recertification  09/22/24    Progress Note Due on Visit 10    PT Start Time 1539    PT Stop Time 1620    PT Time Calculation (min) 41 min    Equipment Utilized During Treatment Gait belt    Activity Tolerance Patient tolerated treatment well    Behavior During Therapy WFL for tasks assessed/performed            Past Medical History:  Diagnosis Date   Angiodysplasia of intestine with hemorrhage    Arthritis    hips, lower back   Basal cell carcinoma    Breast lump 10/21/2011   Overview:  Negative diagnostic mammogram 09/2011    Depression    Fracture of rib of right side 08/17/2013   Hypercholesterolemia    Hypothyroidism    Low blood pressure 10/25/2014   Mold exposure 03/21/2024   Panic disorder    previous agoraphobia   Seizures (HCC)    febrile - as infant   Shoulder pain, left    bursa   Squamous cell carcinoma of skin    Squamous cell carcinoma of skin 08/07/2022   L lateral lower leg, EDC sched 09/04/22 at 11:45 AM   Vertigo    positional - rare   Vitamin D  deficiency    Past Surgical History:  Procedure Laterality Date   ABDOMINAL HYSTERECTOMY  1981   partial - secondary to bleeding, ovaries not removed -  vaginal   APPENDECTOMY     BRONCHIAL BIOPSY  02/10/2024   Procedure: BRONCHOSCOPY, WITH BIOPSY;  Surgeon: Geronimo Amel, MD;  Location: [facility];  Service: Endoscopy;;   BRONCHIAL WASHINGS  02/10/2024   Procedure: IRRIGATION, BRONCHUS;  Surgeon: Geronimo Amel, MD;  Location: [facility];  Service: Endoscopy;;   CATARACT EXTRACTION W/ INTRAOCULAR LENS  IMPLANT, BILATERAL     COLONOSCOPY WITH PROPOFOL  N/A  05/05/2015   Procedure: COLONOSCOPY WITH PROPOFOL ;  Surgeon: Rogelia Copping, MD;  [facility];  Service: Endoscopy;  Laterality: N/A;   COLONOSCOPY WITH PROPOFOL  N/A 06/26/2021   Procedure: COLONOSCOPY WITH PROPOFOL ;  Surgeon: Therisa Bi, MD;  Location: [facility];  Service: Gastroenterology;  Laterality: N/A;   JOINT REPLACEMENT  09/28/2020   MASS EXCISION Right 12/24/2017   Procedure: EXCISION MASS ( SHIN );  Surgeon: Jama Cordella MATSU, MD;  Location: [facility];  Service: Vascular;  Laterality: Right;   ORIF FIBULA FRACTURE Right 2003   right lower extremitiy   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   shoulder Left    bone suprs   SHOULDER SURGERY  12/2015   TONSILLECTOMY  1974   TOTAL HIP ARTHROPLASTY Right 03/2023   VIDEO BRONCHOSCOPY N/A 02/10/2024   Procedure: BRONCHOSCOPY, WITH FLUOROSCOPY;  Surgeon: Geronimo Amel, MD;  Location: [facility];  Service: Endoscopy;  Laterality: N/A;   Patient Active Problem List   Diagnosis Date Noted   Fall 05/26/2024   Paraspinal muscle spasm 05/26/2024   Generalized anxiety disorder 05/26/2024   Mold exposure 03/21/2024   ILD (interstitial lung disease) (HCC) 02/10/2024   Multiple lung nodules 02/10/2024   Interstitial lung abnormality (ILA) 01/14/2024  Osteoporosis 11/17/2023   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 07/15/2023   Depression 07/15/2023   History of cigarette smoking 01/31/2023   Balance disorder 06/05/2020   Arthritis 01/15/2019   Idiopathic colitis    Vertigo 03/19/2015   Hypercholesterolemia 03/07/2013   Hypothyroidism 03/07/2013    ONSET DATE: Progressive over 6 months ago  REFERRING DIAG: Fall, Vertigo, Paraspinal Muscle spasm  THERAPY DIAG:  Muscle weakness (generalized)  Imbalance  Unsteady gait  Abnormality of gait and mobility  Rationale for Evaluation and Treatment: Rehabilitation  SUBJECTIVE:                                                                                                                                                                                              SUBJECTIVE STATEMENT: Pt reports doing well today. Pt denies any recent falls/stumbles since prior session. Feels like her legs are already getting stronger.   Pt accompanied by: self  PERTINENT HISTORY:  Per Dr. Abbey office visit note on 05/26/2024: Balance disorder Discussed hydration importance due to low blood pressure history. Ensure daily water  intake of 40-50 ounces. PT/OT discussed physical therapy for strengthening and fall risk reduction. Refer to physical therapy for muscle strengthening exercises.   Fall, initial encounter Plan per balance disorder. Orders:   B12; Future   Ambulatory referral to Physical Therapy   Vertigo Positional, ineducable vertigo suggestive of benign paroxysmal positional vertigo. Referral to PT/ OT for vestibular therapy.   Orders:   Ambulatory referral to Physical Therapy   Ambulatory referral to Occupational Therapy   Paraspinal muscle spasm Cervical paraspinal muscle spasm, left worse than right. Physical therapy referral made. Avoiding muscle relaxant due to increased risk of fall.     PAIN:  Are you having pain? Not currently but most recent cervical pain which has improved.   PRECAUTIONS: Fall  RED FLAGS: Bowel or bladder incontinence: Yes: urge, Frequency   WEIGHT BEARING RESTRICTIONS: No  FALLS: Has patient fallen in last 6 months? Yes. Number of falls 2  LIVING ENVIRONMENT: Lives with: lives with their spouse Lives in: House/apartment Stairs: Yes: Internal: 15 steps; on left going up and External: 3 in garage steps; freezer on right side that she holds up Has following equipment at home: Single point cane, Walker - 2 wheeled, and shower chair  PLOF: Independent  PATIENT GOALS: To be able to stand up without losing my balance; not fall; be stable  OBJECTIVE:  Note: Objective measures were completed at Evaluation  unless otherwise noted.  DIAGNOSTIC FINDINGS: CLINICAL DATA:  low back pain post-surgery, eval for compression fracture or bony injury.   EXAM: LUMBAR SPINE -  COMPLETE 4+ VIEW   COMPARISON:  04/12/2014.   FINDINGS: There are 5 nonrib-bearing lumbar vertebrae.   Anatomic lumbar curvature.   No spondylolysis. Minimal/grade 1 retrolisthesis of L2 over L3 and minimal/grade 1 anterolisthesis of L3 over L4, most likely degenerative.   Vertebral body heights are maintained.   No aggressive osseous lesion.   Mild-moderate multilevel degenerative changes in the form of reduced intervertebral disc height, endplate sclerosis/irregularity, facet arthropathy and marginal osteophyte formation.   Sacroiliac joints are symmetric.   Visualized soft tissues are within normal limits.   Right hip arthroplasty noted.   IMPRESSION: No acute osseous abnormality of the lumbar spine. Mild-to-moderate multilevel degenerative changes.     Electronically Signed   By: Ree Molt M.D.   On: 02/12/2024 17:13    COGNITION: Overall cognitive status: Within functional limits for tasks assessed   SENSATION: WFL  COORDINATION: Impaired with turning and walking/turning  EDEMA:  None observed either LE   POSTURE: rounded shoulders and forward head  LOWER EXTREMITY ROM:     Active  Right Eval Left Eval  Hip flexion    Hip extension    Hip abduction    Hip adduction    Hip internal rotation    Hip external rotation    Knee flexion    Knee extension    Ankle dorsiflexion    Ankle plantarflexion    Ankle inversion    Ankle eversion     (Blank rows = not tested)  LOWER EXTREMITY MMT:    MMT Right Eval Left Eval  Hip flexion 4 4  Hip extension 4 4  Hip abduction 4 4  Hip adduction 4 4  Hip internal rotation    Hip external rotation    Knee flexion 4 4  Knee extension 4 4  Ankle dorsiflexion 4 4  Ankle plantarflexion    Ankle inversion    Ankle eversion    (Blank  rows = not tested)  BED MOBILITY:  Not tested- Patient verbalized independent without issues  TRANSFERS: Sit to stand: Complete Independence  Assistive device utilized: None     Stand to sit: Complete Independence  Assistive device utilized: None     Chair to chair: Complete Independence  Assistive device utilized: None       RAMP:  Not tested  CURB:  Not tested  STAIRS: Not tested GAIT: Findings: Gait Characteristics: decreased arm swing- Right, decreased arm swing- Left, decreased step length- Right, decreased step length- Left, and decreased stride length, Distance walked: approx 100 feet, Assistive device utilized:None, and Level of assistance: SBA  FUNCTIONAL TESTS:  30 seconds chair stand test= 10 reps without UE Support Timed up and go (TUG): 11.44 and 11.78 sec; or 11.61 sec avg without AD 10 meter walk test: 12.23 and 12.45 sec - or  0.81 m/s avg BERG: 49/56 FGA: Next visit or Minibest      PATIENT SURVEYS:  ABC scale: The Activities-Specific Balance Confidence (ABC) Scale 0% 10 20 30  40 50 60 70 80 90 100% No confidence<->completely confident  How confident are you that you will not lose your balance or become unsteady when you . . .   Date tested 06/30/2024  Walk around the house 90%  2. Walk up or down stairs 100%  3. Bend over and pick up a slipper from in front of a closet floor 60%  4. Reach for a small can off a shelf at eye level 70%  5. Stand on tip toes and  reach for something above your head 30%  6. Stand on a chair and reach for something 0%  7. Sweep the floor 80%  8. Walk outside the house to a car parked in the driveway 80%  9. Get into or out of a car 70%  10. Walk across a parking lot to the mall 50%  11. Walk up or down a ramp 60%  12. Walk in a crowded mall where people rapidly walk past you 50%  13. Are bumped into by people as you walk through the mall 40%  14. Step onto or off of an escalator while you are holding onto the railing 90%   15. Step onto or off an escalator while holding onto parcels such that you cannot hold onto the railing 40%  16. Walk outside on icy sidewalks 0%  Total: #/16 56.88%                                                                                                                                 TREATMENT DATE:   BP:   Sitting: 96/73(82) HR 90   Standing 0 min: 96/73(82) HR 93 Standing 2 min: 85/65(72) HR 99    Pt reports that she is Asymptomatic.     TA- To improve functional movements patterns for everyday tasks   Gait with 3# AW 2x 450 ft  STS 2x 10 with 3KG ball   TE: Standing abduction at balance station with 3# AW donned: 2x15 each.   Standing marches at balance station with 3# AW donned: 2x15 each.   Seated LAQ x 15; 3# AW   Seated hip flexion x 15; 3 # AW  Standing heel/toe raise 2 x 12 reps with light UE support      PATIENT EDUCATION: Education details: Purpose of PT: Proposed plan of care; Symptoms of orthostatic Hypotension; Importance of continuing to keep journal of BP reading and relay to MD; Ed in basic balance HEP Pt educated throughout session about proper posture and technique with exercises. Improved exercise technique, movement at target joints, use of target muscles after min to mod verbal, visual, tactile cues.  Person educated: Patient Education method: Explanation, Demonstration, Tactile cues, and Verbal cues Education comprehension: verbalized understanding, returned demonstration, verbal cues required, tactile cues required, and needs further education  HOME EXERCISE PROGRAM: 06/30/2024-SLS (Instructed to hold up to 20+ sec  at home multiple times each)- 3x/week Added Tandem standing at kitchen counter- hold up to 30 sec x 3 each Side - 3x/week  GOALS: Goals reviewed with patient? Yes  SHORT TERM GOALS: Target date: 08/11/2024  Pt will be independent with HEP in order to improve strength and balance in order to decrease fall risk and improve  function at home and work.  Baseline: EVAL- No formal Plan in place Goal status: INITIAL  2.  Patient will deny any falls in home or community for improved safety and quality of life.  Baseline: EVAL= 2 falls in past 6 months Goal status: INITIAL  LONG TERM GOALS: Target date: 09/22/2024  1.  Patient will complete > 13 reps in 30 sec sit to stand test indicating an increased LE strength and improved balance. Baseline: EVAL= 10 reps Goal status: INITIAL  2.  Patient will improve ABC score by 10 points to demonstrate statistically significant improvement in mobility and quality of life as it relates to their confidence in her balance.  Baseline: EVAL= 56.88% Goal status: INITIAL   3.  Patient will increase Berg Balance score by > 4 points to demonstrate decreased fall risk during functional activities. Baseline: EVAL= 49/56 Goal status: INITIAL    4. Patient will increase 10 meter walk test to >1.87m/s as to improve gait speed for better community ambulation and to reduce fall risk. Baseline: EVAL= 0.81 m/s w/o AD Goal status: INITIAL  5.   Patient will increase six minute walk test distance by 150 feet for progression to community ambulator and improve gait ability Baseline: EVAL- 1,250'  Goal status: INITIAL   ASSESSMENT:  CLINICAL IMPRESSION: Patient presents with good motivation for completion of physical therapy activities. Reports that she can start for feel a difference in her mobility over the last 3 sessions. Patient progresses with lower extremity strength, endurance, and functional mobility training, tolerating all interventions well. Reduced foot drag on this day compared to prior sessions. Reduced hypotension on this day, but still noted to have standing BP 85/65.  Patient instructed to contact her doctor regarding this particularly if she is symptomatic. Pt will continue to benefit from skilled physical therapy intervention to address impairments, improve QOL, and attain  therapy goals.    OBJECTIVE IMPAIRMENTS: Abnormal gait, decreased activity tolerance, decreased balance, decreased coordination, decreased endurance, decreased mobility, difficulty walking, decreased strength, dizziness, and postural dysfunction.   ACTIVITY LIMITATIONS: carrying, lifting, bending, standing, squatting, stairs, and transfers  PARTICIPATION LIMITATIONS: meal prep, cleaning, laundry, driving, shopping, community activity, and yard work  PERSONAL FACTORS: Age, Fitness, and 1-2 comorbidities: Hypotension, Anxiety are also affecting patient's functional outcome.   REHAB POTENTIAL: Good  CLINICAL DECISION MAKING: Evolving/moderate complexity  EVALUATION COMPLEXITY: Moderate  PLAN:  PT FREQUENCY: 1-2x/week  PT DURATION: 12 weeks  PLANNED INTERVENTIONS: [id number]- PT Re-evaluation, 97750- Physical Performance Testing, 97110-Therapeutic exercises, 97530- Therapeutic activity, [id number]- Neuromuscular re-education, 97535- Self Care, [id number]- Manual therapy, [id number]- Gait training, [id number]- Orthotic Initial, [id number]- Orthotic/Prosthetic subsequent, [phone]- Canalith repositioning, [id number]- Electrical stimulation (manual), [phone] (1-2 muscles), [id number] (3+ muscles)- Dry Needling, Patient/Family education, Balance training, Stair training, Taping, Joint mobilization, Spinal mobilization, Vestibular training, DME instructions, Cryotherapy, and Moist heat  PLAN FOR NEXT SESSION:  Assess more dynamic balance and add to goals if appropriate: Consider FGA or minibest Potential vestibular screen or further assessment if any further issues.  Review and progress balance interventions and add to HEP as appropriate.     Massie FORBES Dollar, PT 07/14/2024, 3:40 PM        "

## 2024-07-14 NOTE — Telephone Encounter (Signed)
 Copied from CRM #[id number]. Topic: Referral - Request for Referral >> Jul 14, 2024 12:51 PM Alfonso ORN wrote: Did the patient discuss referral with their provider in the last year? Yes (If No - schedule appointment) (If Yes - send message)  Appointment offered? Yes  Type of order/referral and detailed reason for visit: Psychologist . pt calling to f/u on referral for therapist. She stated she was told by her provider to let her know if she has not received a call to schedule. Pt not showing a recent referral order for psychologist.   Preference of office, provider, location: n/a  If referral order, have you been seen by this specialty before? No (If Yes, this issue or another issue? When? Where?  Can we respond through MyChart? No

## 2024-07-15 ENCOUNTER — Ambulatory Visit

## 2024-07-20 ENCOUNTER — Ambulatory Visit

## 2024-07-27 ENCOUNTER — Ambulatory Visit: Admitting: Physical Therapy

## 2024-07-28 ENCOUNTER — Ambulatory Visit

## 2024-07-28 DIAGNOSIS — R2681 Unsteadiness on feet: Secondary | ICD-10-CM

## 2024-07-28 DIAGNOSIS — R262 Difficulty in walking, not elsewhere classified: Secondary | ICD-10-CM

## 2024-07-28 DIAGNOSIS — M6281 Muscle weakness (generalized): Secondary | ICD-10-CM
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
## 2024-07-29 ENCOUNTER — Ambulatory Visit

## 2024-08-03 ENCOUNTER — Ambulatory Visit: Admitting: Physical Therapy

## 2024-08-04 ENCOUNTER — Ambulatory Visit

## 2024-08-05 ENCOUNTER — Encounter

## 2024-08-06 ENCOUNTER — Ambulatory Visit

## 2024-08-06 ENCOUNTER — Ambulatory Visit: Admitting: Internal Medicine

## 2024-08-10 ENCOUNTER — Ambulatory Visit: Admitting: Physical Therapy

## 2024-08-11 ENCOUNTER — Ambulatory Visit

## 2024-08-13 ENCOUNTER — Ambulatory Visit

## 2024-08-17 ENCOUNTER — Ambulatory Visit: Admitting: Physical Therapy

## 2024-08-18 ENCOUNTER — Ambulatory Visit

## 2024-08-19 ENCOUNTER — Ambulatory Visit: Admitting: Physical Therapy

## 2024-08-23 ENCOUNTER — Ambulatory Visit: Admitting: Physical Therapy

## 2024-08-25 ENCOUNTER — Ambulatory Visit

## 2024-08-30 ENCOUNTER — Ambulatory Visit

## 2024-09-01 ENCOUNTER — Ambulatory Visit

## 2024-09-03 ENCOUNTER — Ambulatory Visit

## 2024-09-06 ENCOUNTER — Ambulatory Visit

## 2024-09-08 ENCOUNTER — Ambulatory Visit

## 2024-09-13 ENCOUNTER — Ambulatory Visit: Admitting: Physical Therapy

## 2024-09-15 ENCOUNTER — Ambulatory Visit

## 2024-09-20 ENCOUNTER — Ambulatory Visit: Admitting: Physical Therapy

## 2024-09-22 ENCOUNTER — Ambulatory Visit: Admitting: Physical Therapy

## 2024-09-27 ENCOUNTER — Ambulatory Visit: Admitting: Physical Therapy

## 2024-09-29 ENCOUNTER — Ambulatory Visit

## 2024-10-04 ENCOUNTER — Ambulatory Visit: Admitting: Physical Therapy

## 2024-10-06 ENCOUNTER — Ambulatory Visit

## 2024-10-11 ENCOUNTER — Ambulatory Visit: Admitting: Physical Therapy

## 2024-10-13 ENCOUNTER — Ambulatory Visit

## 2024-10-18 ENCOUNTER — Ambulatory Visit: Admitting: Physical Therapy

## 2024-10-20 ENCOUNTER — Ambulatory Visit

## 2024-10-25 ENCOUNTER — Ambulatory Visit

## 2024-10-27 ENCOUNTER — Ambulatory Visit

## 2024-11-01 ENCOUNTER — Ambulatory Visit

## 2024-11-03 ENCOUNTER — Ambulatory Visit

## 2024-11-11 ENCOUNTER — Encounter: Admitting: Family

## 2025-01-25 ENCOUNTER — Encounter: Admitting: Dermatology
